# Patient Record
Sex: Female | Born: 1945
Health system: Southern US, Community
[De-identification: ages and names within clinical notes are randomized; demographics above are authoritative.]

## PROBLEM LIST (undated history)

## (undated) ENCOUNTER — Ambulatory Visit

## (undated) ENCOUNTER — Telehealth

## (undated) ENCOUNTER — Other Ambulatory Visit

## (undated) ENCOUNTER — Ambulatory Visit: Payer: Medicare (Managed Care)

## (undated) ENCOUNTER — Encounter

## (undated) ENCOUNTER — Ambulatory Visit: Payer: PRIVATE HEALTH INSURANCE

## (undated) ENCOUNTER — Inpatient Hospital Stay

## (undated) DIAGNOSIS — I739 Peripheral vascular disease, unspecified: Secondary | ICD-10-CM

## (undated) DIAGNOSIS — E785 Hyperlipidemia, unspecified: Secondary | ICD-10-CM

## (undated) DIAGNOSIS — G629 Polyneuropathy, unspecified: Secondary | ICD-10-CM

## (undated) DIAGNOSIS — G8929 Other chronic pain: Secondary | ICD-10-CM

## (undated) DIAGNOSIS — I480 Paroxysmal atrial fibrillation: Secondary | ICD-10-CM

## (undated) DIAGNOSIS — K219 Gastro-esophageal reflux disease without esophagitis: Secondary | ICD-10-CM

## (undated) DIAGNOSIS — M5136 Other intervertebral disc degeneration, lumbar region: Secondary | ICD-10-CM

## (undated) DIAGNOSIS — M5126 Other intervertebral disc displacement, lumbar region: Secondary | ICD-10-CM

## (undated) DIAGNOSIS — T8859XA Other complications of anesthesia, initial encounter: Secondary | ICD-10-CM

## (undated) DIAGNOSIS — D649 Anemia, unspecified: Secondary | ICD-10-CM

## (undated) DIAGNOSIS — M51369 Other intervertebral disc degeneration, lumbar region without mention of lumbar back pain or lower extremity pain: Secondary | ICD-10-CM

## (undated) DIAGNOSIS — I440 Atrioventricular block, first degree: Secondary | ICD-10-CM

## (undated) DIAGNOSIS — Z923 Personal history of irradiation: Secondary | ICD-10-CM

## (undated) DIAGNOSIS — M1990 Unspecified osteoarthritis, unspecified site: Secondary | ICD-10-CM

## (undated) DIAGNOSIS — I10 Essential (primary) hypertension: Secondary | ICD-10-CM

## (undated) DIAGNOSIS — I427 Cardiomyopathy due to drug and external agent: Secondary | ICD-10-CM

## (undated) DIAGNOSIS — Z7901 Long term (current) use of anticoagulants: Secondary | ICD-10-CM

## (undated) DIAGNOSIS — T451X5A Adverse effect of antineoplastic and immunosuppressive drugs, initial encounter: Secondary | ICD-10-CM

## (undated) DIAGNOSIS — Z9221 Personal history of antineoplastic chemotherapy: Secondary | ICD-10-CM

## (undated) DIAGNOSIS — T4145XA Adverse effect of unspecified anesthetic, initial encounter: Secondary | ICD-10-CM

## (undated) DIAGNOSIS — Z9889 Other specified postprocedural states: Secondary | ICD-10-CM

## (undated) DIAGNOSIS — G4700 Insomnia, unspecified: Secondary | ICD-10-CM

## (undated) DIAGNOSIS — I38 Endocarditis, valve unspecified: Secondary | ICD-10-CM

## (undated) DIAGNOSIS — C50919 Malignant neoplasm of unspecified site of unspecified female breast: Secondary | ICD-10-CM

## (undated) DIAGNOSIS — I2510 Atherosclerotic heart disease of native coronary artery without angina pectoris: Secondary | ICD-10-CM

## (undated) DIAGNOSIS — F419 Anxiety disorder, unspecified: Secondary | ICD-10-CM

## (undated) DIAGNOSIS — R112 Nausea with vomiting, unspecified: Secondary | ICD-10-CM

## (undated) HISTORY — PX: OTHER SURGICAL HISTORY: SHX169

## (undated) HISTORY — PX: APPENDECTOMY: SHX54

## (undated) HISTORY — PX: TONSILLECTOMY: SUR1361

## (undated) HISTORY — DX: Essential (primary) hypertension: I10

## (undated) HISTORY — PX: MASTECTOMY: SHX3

## (undated) HISTORY — DX: Hyperlipidemia, unspecified: E78.5

## (undated) HISTORY — DX: Malignant neoplasm of unspecified site of unspecified female breast: C50.919

---

## 2005-10-13 ENCOUNTER — Emergency Department: Payer: Self-pay | Admitting: Emergency Medicine

## 2008-06-20 ENCOUNTER — Ambulatory Visit: Payer: Self-pay | Admitting: Oncology

## 2008-07-16 ENCOUNTER — Ambulatory Visit: Payer: Self-pay | Admitting: Oncology

## 2008-07-21 ENCOUNTER — Ambulatory Visit: Payer: Self-pay | Admitting: Oncology

## 2008-08-17 ENCOUNTER — Ambulatory Visit: Payer: Self-pay | Admitting: General Surgery

## 2008-08-21 ENCOUNTER — Ambulatory Visit: Payer: Self-pay | Admitting: Oncology

## 2008-09-17 ENCOUNTER — Inpatient Hospital Stay: Payer: Self-pay | Admitting: Oncology

## 2008-09-20 ENCOUNTER — Ambulatory Visit: Payer: Self-pay | Admitting: Oncology

## 2008-10-02 ENCOUNTER — Ambulatory Visit: Payer: Self-pay | Admitting: General Surgery

## 2008-10-21 ENCOUNTER — Ambulatory Visit: Payer: Self-pay | Admitting: Oncology

## 2008-11-20 ENCOUNTER — Ambulatory Visit: Payer: Self-pay | Admitting: Oncology

## 2008-12-21 ENCOUNTER — Ambulatory Visit: Payer: Self-pay | Admitting: Oncology

## 2009-01-21 ENCOUNTER — Ambulatory Visit: Payer: Self-pay | Admitting: Oncology

## 2009-02-07 ENCOUNTER — Ambulatory Visit: Payer: Self-pay | Admitting: Cardiology

## 2009-02-07 ENCOUNTER — Ambulatory Visit: Payer: Self-pay | Admitting: Surgery

## 2009-02-14 ENCOUNTER — Ambulatory Visit: Payer: Self-pay | Admitting: Surgery

## 2009-02-18 ENCOUNTER — Ambulatory Visit: Payer: Self-pay | Admitting: Oncology

## 2009-03-21 ENCOUNTER — Ambulatory Visit: Payer: Self-pay | Admitting: Oncology

## 2009-04-20 ENCOUNTER — Ambulatory Visit: Payer: Self-pay | Admitting: Oncology

## 2009-05-21 ENCOUNTER — Ambulatory Visit: Payer: Self-pay | Admitting: Oncology

## 2009-06-20 ENCOUNTER — Ambulatory Visit: Payer: Self-pay | Admitting: Oncology

## 2009-07-21 ENCOUNTER — Ambulatory Visit: Payer: Self-pay | Admitting: Oncology

## 2009-08-21 ENCOUNTER — Ambulatory Visit: Payer: Self-pay | Admitting: Oncology

## 2009-09-18 ENCOUNTER — Ambulatory Visit: Payer: Self-pay | Admitting: Oncology

## 2009-09-20 ENCOUNTER — Ambulatory Visit: Payer: Self-pay | Admitting: Oncology

## 2009-11-08 ENCOUNTER — Ambulatory Visit: Payer: Self-pay | Admitting: Oncology

## 2009-12-21 ENCOUNTER — Ambulatory Visit: Payer: Self-pay | Admitting: Oncology

## 2009-12-21 DIAGNOSIS — T451X5A Adverse effect of antineoplastic and immunosuppressive drugs, initial encounter: Secondary | ICD-10-CM

## 2009-12-21 DIAGNOSIS — I427 Cardiomyopathy due to drug and external agent: Secondary | ICD-10-CM

## 2009-12-21 HISTORY — DX: Cardiomyopathy due to drug and external agent: I42.7

## 2009-12-21 HISTORY — DX: Adverse effect of antineoplastic and immunosuppressive drugs, initial encounter: T45.1X5A

## 2010-01-01 ENCOUNTER — Ambulatory Visit: Payer: Self-pay | Admitting: Oncology

## 2010-01-07 ENCOUNTER — Ambulatory Visit: Payer: Self-pay | Admitting: Oncology

## 2010-01-21 ENCOUNTER — Ambulatory Visit: Payer: Self-pay | Admitting: Oncology

## 2010-01-27 ENCOUNTER — Ambulatory Visit: Payer: Self-pay | Admitting: Ophthalmology

## 2010-02-12 ENCOUNTER — Ambulatory Visit: Payer: Self-pay | Admitting: Ophthalmology

## 2010-02-18 ENCOUNTER — Ambulatory Visit: Payer: Self-pay | Admitting: Oncology

## 2010-03-08 ENCOUNTER — Emergency Department: Payer: Self-pay | Admitting: Emergency Medicine

## 2010-03-17 ENCOUNTER — Observation Stay: Payer: Self-pay | Admitting: Internal Medicine

## 2010-03-21 ENCOUNTER — Ambulatory Visit: Payer: Self-pay | Admitting: Oncology

## 2010-03-25 ENCOUNTER — Ambulatory Visit: Payer: Self-pay | Admitting: Pain Medicine

## 2010-04-03 ENCOUNTER — Ambulatory Visit: Payer: Self-pay | Admitting: Oncology

## 2010-04-20 ENCOUNTER — Ambulatory Visit: Payer: Self-pay | Admitting: Oncology

## 2010-04-28 ENCOUNTER — Ambulatory Visit: Payer: Self-pay | Admitting: Pain Medicine

## 2010-05-01 ENCOUNTER — Ambulatory Visit: Payer: Self-pay | Admitting: Pain Medicine

## 2010-05-07 ENCOUNTER — Encounter: Payer: Self-pay | Admitting: Pain Medicine

## 2010-05-15 ENCOUNTER — Ambulatory Visit: Payer: Self-pay | Admitting: Pain Medicine

## 2010-05-21 ENCOUNTER — Encounter: Payer: Self-pay | Admitting: Pain Medicine

## 2010-06-20 ENCOUNTER — Ambulatory Visit: Payer: Self-pay | Admitting: Oncology

## 2010-07-03 ENCOUNTER — Ambulatory Visit: Payer: Self-pay | Admitting: Oncology

## 2010-07-10 ENCOUNTER — Ambulatory Visit: Payer: Self-pay | Admitting: Oncology

## 2010-07-11 LAB — CANCER ANTIGEN 27.29: CA 27.29: 28 U/mL (ref 0.0–38.6)

## 2010-07-21 ENCOUNTER — Ambulatory Visit: Payer: Self-pay | Admitting: Oncology

## 2010-10-09 ENCOUNTER — Ambulatory Visit: Payer: Self-pay | Admitting: Oncology

## 2010-10-10 LAB — CANCER ANTIGEN 27.29: CA 27.29: 32.8 U/mL (ref 0.0–38.6)

## 2010-10-21 ENCOUNTER — Ambulatory Visit: Payer: Self-pay | Admitting: Oncology

## 2011-01-05 ENCOUNTER — Ambulatory Visit: Payer: Self-pay | Admitting: Oncology

## 2011-01-07 ENCOUNTER — Ambulatory Visit: Payer: Self-pay | Admitting: Oncology

## 2011-01-21 ENCOUNTER — Ambulatory Visit: Payer: Self-pay | Admitting: Oncology

## 2011-04-08 ENCOUNTER — Ambulatory Visit: Payer: Self-pay | Admitting: Oncology

## 2011-04-09 LAB — CANCER ANTIGEN 27.29: CA 27.29: 39 U/mL — ABNORMAL HIGH (ref 0.0–38.6)

## 2011-04-21 ENCOUNTER — Ambulatory Visit: Payer: Self-pay | Admitting: Oncology

## 2011-06-17 ENCOUNTER — Ambulatory Visit: Payer: Self-pay | Admitting: Oncology

## 2011-07-07 ENCOUNTER — Ambulatory Visit: Payer: Self-pay | Admitting: Oncology

## 2011-07-08 ENCOUNTER — Ambulatory Visit: Payer: Self-pay | Admitting: Oncology

## 2011-07-22 ENCOUNTER — Ambulatory Visit: Payer: Self-pay | Admitting: Oncology

## 2011-09-14 ENCOUNTER — Ambulatory Visit: Payer: Self-pay | Admitting: Oncology

## 2011-09-21 ENCOUNTER — Ambulatory Visit: Payer: Self-pay | Admitting: Oncology

## 2011-10-22 ENCOUNTER — Ambulatory Visit: Payer: Self-pay | Admitting: Oncology

## 2011-12-22 HISTORY — PX: EYE SURGERY: SHX253

## 2012-01-12 ENCOUNTER — Ambulatory Visit: Payer: Self-pay | Admitting: Oncology

## 2012-01-22 ENCOUNTER — Ambulatory Visit: Payer: Self-pay | Admitting: Oncology

## 2012-06-14 ENCOUNTER — Ambulatory Visit: Payer: Self-pay | Admitting: Ophthalmology

## 2012-06-14 LAB — POTASSIUM: Potassium: 4.2 mmol/L (ref 3.5–5.1)

## 2012-06-28 ENCOUNTER — Ambulatory Visit: Payer: Self-pay | Admitting: Ophthalmology

## 2012-07-19 ENCOUNTER — Ambulatory Visit: Payer: Self-pay | Admitting: Oncology

## 2012-07-21 ENCOUNTER — Ambulatory Visit: Payer: Self-pay | Admitting: Oncology

## 2012-08-21 ENCOUNTER — Ambulatory Visit: Payer: Self-pay | Admitting: Oncology

## 2012-10-18 ENCOUNTER — Ambulatory Visit: Payer: Self-pay | Admitting: Internal Medicine

## 2012-12-23 ENCOUNTER — Ambulatory Visit: Payer: Self-pay

## 2012-12-27 ENCOUNTER — Ambulatory Visit: Payer: Self-pay | Admitting: Oncology

## 2012-12-27 LAB — COMPREHENSIVE METABOLIC PANEL
Albumin: 4.3 g/dL (ref 3.4–5.0)
BUN: 21 mg/dL — ABNORMAL HIGH (ref 7–18)
Bilirubin,Total: 0.5 mg/dL (ref 0.2–1.0)
Chloride: 99 mmol/L (ref 98–107)
Co2: 27 mmol/L (ref 21–32)
Creatinine: 1.09 mg/dL (ref 0.60–1.30)
EGFR (Non-African Amer.): 52 — ABNORMAL LOW
Glucose: 117 mg/dL — ABNORMAL HIGH (ref 65–99)
Osmolality: 278 (ref 275–301)
Potassium: 4.6 mmol/L (ref 3.5–5.1)
SGOT(AST): 40 U/L — ABNORMAL HIGH (ref 15–37)
SGPT (ALT): 45 U/L (ref 12–78)
Sodium: 137 mmol/L (ref 136–145)

## 2012-12-27 LAB — CBC CANCER CENTER
Eosinophil #: 0.1 x10 3/mm (ref 0.0–0.7)
Lymphocyte %: 29.2 %
MCH: 30 pg (ref 26.0–34.0)
MCV: 87 fL (ref 80–100)
Monocyte #: 0.5 x10 3/mm (ref 0.2–0.9)
Monocyte %: 7.3 %
Neutrophil #: 4.1 x10 3/mm (ref 1.4–6.5)
Neutrophil %: 61.1 %
RBC: 4.88 10*6/uL (ref 3.80–5.20)
RDW: 13.2 % (ref 11.5–14.5)
WBC: 6.6 x10 3/mm (ref 3.6–11.0)

## 2012-12-27 LAB — PROTIME-INR
INR: 1
Prothrombin Time: 13.2 secs (ref 11.5–14.7)

## 2012-12-28 LAB — CANCER ANTIGEN 27.29: CA 27.29: 37.9 U/mL (ref 0.0–38.6)

## 2012-12-30 ENCOUNTER — Ambulatory Visit: Payer: Self-pay | Admitting: Oncology

## 2013-01-21 ENCOUNTER — Ambulatory Visit: Payer: Self-pay | Admitting: Oncology

## 2013-01-27 LAB — CBC CANCER CENTER
Basophil #: 0 x10 3/mm (ref 0.0–0.1)
Basophil %: 0.3 %
Eosinophil #: 0.4 x10 3/mm (ref 0.0–0.7)
Eosinophil %: 7.8 %
HCT: 40.3 % (ref 35.0–47.0)
HGB: 14 g/dL (ref 12.0–16.0)
Lymphocyte #: 1.6 x10 3/mm (ref 1.0–3.6)
MCH: 30.5 pg (ref 26.0–34.0)
MCHC: 34.7 g/dL (ref 32.0–36.0)
MCV: 88 fL (ref 80–100)
Monocyte #: 0.6 x10 3/mm (ref 0.2–0.9)
Monocyte %: 11.5 %
Neutrophil #: 2.4 x10 3/mm (ref 1.4–6.5)
Neutrophil %: 48.5 %
Platelet: 198 x10 3/mm (ref 150–440)
RDW: 14.2 % (ref 11.5–14.5)
WBC: 4.9 x10 3/mm (ref 3.6–11.0)

## 2013-02-18 ENCOUNTER — Ambulatory Visit: Payer: Self-pay | Admitting: Oncology

## 2013-04-19 ENCOUNTER — Ambulatory Visit: Payer: Self-pay | Admitting: Oncology

## 2013-04-21 ENCOUNTER — Ambulatory Visit: Payer: Self-pay | Admitting: Oncology

## 2013-04-24 LAB — BASIC METABOLIC PANEL
BUN: 15 mg/dL (ref 7–18)
Chloride: 104 mmol/L (ref 98–107)
Co2: 26 mmol/L (ref 21–32)
Creatinine: 0.93 mg/dL (ref 0.60–1.30)
EGFR (Non-African Amer.): >60
Glucose: 104 mg/dL — ABNORMAL HIGH (ref 65–99)
Osmolality: 282 (ref 275–301)
Potassium: 4.7 mmol/L (ref 3.5–5.1)
Sodium: 141 mmol/L (ref 136–145)

## 2013-04-24 LAB — HEPATIC FUNCTION PANEL A (ARMC)
Albumin: 3.9 g/dL (ref 3.4–5.0)
Alkaline Phosphatase: 105 U/L (ref 50–136)
Bilirubin, Direct: 0.1 mg/dL (ref 0.00–0.20)
Bilirubin,Total: 0.5 mg/dL (ref 0.2–1.0)
SGOT(AST): 25 U/L (ref 15–37)
SGPT (ALT): 29 U/L (ref 12–78)
Total Protein: 7.3 g/dL (ref 6.4–8.2)

## 2013-04-24 LAB — LIPID PANEL
Ldl Cholesterol, Calc: 87 mg/dL (ref 0–100)
Triglycerides: 97 mg/dL (ref 0–200)
VLDL Cholesterol, Calc: 19 mg/dL (ref 5–40)

## 2013-04-26 ENCOUNTER — Ambulatory Visit: Payer: Self-pay | Admitting: Oncology

## 2013-05-21 ENCOUNTER — Ambulatory Visit: Payer: Self-pay | Admitting: Oncology

## 2013-08-10 ENCOUNTER — Ambulatory Visit: Payer: Self-pay | Admitting: Oncology

## 2013-08-12 LAB — CANCER ANTIGEN 27.29: CA 27.29: 32.5 U/mL (ref 0.0–38.6)

## 2013-08-21 ENCOUNTER — Ambulatory Visit: Payer: Self-pay | Admitting: Oncology

## 2013-10-04 ENCOUNTER — Emergency Department: Payer: Self-pay | Admitting: Emergency Medicine

## 2013-10-04 LAB — CK TOTAL AND CKMB (NOT AT ARMC)
CK, Total: 115 U/L (ref 21–215)
CK-MB: 1.4 ng/mL (ref 0.5–3.6)

## 2013-10-04 LAB — URINALYSIS, COMPLETE
Bacteria: NONE SEEN
Bilirubin,UR: NEGATIVE
Blood: NEGATIVE
Glucose,UR: NEGATIVE mg/dL (ref 0–75)
Ph: 7 (ref 4.5–8.0)
RBC,UR: <1 /HPF (ref 0–5)
WBC UR: 1 /HPF (ref 0–5)

## 2013-10-04 LAB — TROPONIN I: Troponin-I: 0.02 ng/mL

## 2013-10-04 LAB — CBC
HCT: 43.7 % (ref 35.0–47.0)
MCH: 30.2 pg (ref 26.0–34.0)
MCV: 89 fL (ref 80–100)
RBC: 4.93 10*6/uL (ref 3.80–5.20)
RDW: 13.4 % (ref 11.5–14.5)

## 2013-10-04 LAB — COMPREHENSIVE METABOLIC PANEL
Albumin: 4.3 g/dL (ref 3.4–5.0)
Alkaline Phosphatase: 125 U/L (ref 50–136)
Anion Gap: 6 — ABNORMAL LOW (ref 7–16)
Bilirubin,Total: 0.5 mg/dL (ref 0.2–1.0)
Calcium, Total: 9.8 mg/dL (ref 8.5–10.1)
Chloride: 105 mmol/L (ref 98–107)
Glucose: 129 mg/dL — ABNORMAL HIGH (ref 65–99)
Potassium: 4.1 mmol/L (ref 3.5–5.1)
SGOT(AST): 38 U/L — ABNORMAL HIGH (ref 15–37)
Total Protein: 8 g/dL (ref 6.4–8.2)

## 2014-05-03 ENCOUNTER — Ambulatory Visit: Payer: Self-pay | Admitting: Oncology

## 2014-05-21 ENCOUNTER — Ambulatory Visit: Payer: Self-pay | Admitting: Oncology

## 2014-12-21 HISTORY — PX: BREAST IMPLANT REMOVAL: SUR1101

## 2014-12-21 HISTORY — PX: PLACEMENT OF BREAST IMPLANTS: SHX6334

## 2014-12-21 HISTORY — PX: PORT-A-CATH REMOVAL: SHX5289

## 2015-04-14 NOTE — Op Note (Signed)
PATIENT NAME:  Kelsey Carter, Kelsey Carter MR#:  [id number] DATE OF BIRTH:  09-Jan-1946  DATE OF PROCEDURE:  06/28/2012  PREOPERATIVE DIAGNOSIS: Visually significant cataract of the right eye.   POSTOPERATIVE DIAGNOSIS: Visually significant cataract of the right eye.   OPERATIVE PROCEDURE: Cataract extraction by phacoemulsification with implant of intraocular lens to right eye.   SURGEON: Kelsey Robson, MD.   ANESTHESIA:  1. Managed anesthesia care.  2. Topical tetracaine drops followed by 2% Xylocaine jelly applied in the preoperative holding area.   COMPLICATIONS: None.   TECHNIQUE:  Stop-and-chop    DESCRIPTION OF PROCEDURE: The patient was examined and consented in the preoperative holding area where the aforementioned topical anesthesia was applied to the right eye and then brought back to the Operating Room where the right eye was prepped and draped in the usual sterile ophthalmic fashion and a lid speculum was placed. A paracentesis was created with the side port blade and the anterior chamber was filled with viscoelastic. A near clear corneal incision was performed with the steel keratome. A continuous curvilinear capsulorrhexis was performed with a cystotome followed by the capsulorrhexis forceps. Hydrodissection and hydrodelineation were carried out with BSS on a blunt cannula. The lens was removed in a stop-and-chop technique and the remaining cortical material was removed with the irrigation-aspiration handpiece. The capsular bag was inflated with viscoelastic and the Alcon SN60WF 20.5-diopter lens, serial number [id number] was placed in the capsular bag without complication. The remaining viscoelastic was removed from the eye with the irrigation-aspiration handpiece. The wounds were hydrated. The anterior chamber was flushed with Miostat and the eye was inflated to physiologic pressure. The wounds were found to be water tight. The eye was dressed with Vigamox. The patient was given  protective glasses to wear throughout the day and a shield with which to sleep tonight. The patient was also given drops with which to begin a drop regimen today and will follow-up with me in one day.   ____________________________ Livingston Diones. Harvie Morua, MD wlp:drc D: 06/28/2012 12:37:08 ET T: 06/28/2012 13:04:24 ET JOB#: [id number]  cc: Birt Reinoso L. Masha Orbach, MD, <Dictator> Livingston Diones Ahri Olson MD ELECTRONICALLY SIGNED 06/29/2012 9:59

## 2016-12-21 HISTORY — PX: CARDIAC CATHETERIZATION: SHX172

## 2017-08-24 ENCOUNTER — Ambulatory Visit (INDEPENDENT_AMBULATORY_CARE_PROVIDER_SITE_OTHER): Payer: PPO | Admitting: Family Medicine

## 2017-08-24 ENCOUNTER — Encounter: Payer: Self-pay | Admitting: Family Medicine

## 2017-08-24 VITALS — BP 138/82 | HR 67 | Resp 16 | Ht 65.0 in | Wt 178.9 lb

## 2017-08-24 DIAGNOSIS — C50911 Malignant neoplasm of unspecified site of right female breast: Secondary | ICD-10-CM | POA: Insufficient documentation

## 2017-08-24 DIAGNOSIS — E785 Hyperlipidemia, unspecified: Secondary | ICD-10-CM | POA: Diagnosis not present

## 2017-08-24 DIAGNOSIS — G620 Drug-induced polyneuropathy: Secondary | ICD-10-CM | POA: Insufficient documentation

## 2017-08-24 DIAGNOSIS — B351 Tinea unguium: Secondary | ICD-10-CM | POA: Diagnosis not present

## 2017-08-24 DIAGNOSIS — I10 Essential (primary) hypertension: Secondary | ICD-10-CM

## 2017-08-24 DIAGNOSIS — G4700 Insomnia, unspecified: Secondary | ICD-10-CM | POA: Diagnosis not present

## 2017-08-24 DIAGNOSIS — Z283 Underimmunization status: Secondary | ICD-10-CM | POA: Diagnosis not present

## 2017-08-24 DIAGNOSIS — T451X5A Adverse effect of antineoplastic and immunosuppressive drugs, initial encounter: Secondary | ICD-10-CM | POA: Diagnosis not present

## 2017-08-24 DIAGNOSIS — I427 Cardiomyopathy due to drug and external agent: Secondary | ICD-10-CM | POA: Insufficient documentation

## 2017-08-24 DIAGNOSIS — E663 Overweight: Secondary | ICD-10-CM | POA: Diagnosis not present

## 2017-08-24 DIAGNOSIS — E559 Vitamin D deficiency, unspecified: Secondary | ICD-10-CM | POA: Diagnosis not present

## 2017-08-24 DIAGNOSIS — Z853 Personal history of malignant neoplasm of breast: Secondary | ICD-10-CM | POA: Diagnosis not present

## 2017-08-24 DIAGNOSIS — C50912 Malignant neoplasm of unspecified site of left female breast: Secondary | ICD-10-CM

## 2017-08-24 DIAGNOSIS — Z289 Immunization not carried out for unspecified reason: Secondary | ICD-10-CM

## 2017-08-24 NOTE — Progress Notes (Signed)
Date:  08/24/2017   Name:  Kelsey Carter   DOB:  1946/07/01   MRN:  [id number]  PCP:  Adline Potter, MD    Chief Complaint: Establish Care (From [place] needs refills on all meds. ) and Insomnia (Sleep med Xanax was what she took for years and is out.)   History of Present Illness:  This is a 71 y.o. female seen for initial visit. Recently moved back to [place] from [place]. Hx breast ca in 2009, s/p RT/chemo and again in 2014 s/p B mastectomy with complications requiring revisions, no issues since 2106, remains on Arimidex per Dr. Grayland Ormond. Cardiac cath in 03/2017 showed heart damage from chemo, metoprolol and Lipitor doses increased and started on daily asa, repeat echo recommended in six months, needs cards referral. Also has peripheral neuropathy from chemo in hands and feet, uses Xanax qhs to help sleep per Dr. Grayland Ormond. On vit D supp for deficiency. Onychomycosis since chemo, saw podiatrist in [place], requests referral. Father died unknown ca 12, mother died heart dz 57, brother died in accident. Had Prevnar in 2017 but declines Pneumovax, flu, zoster, and tetanus imms as says make her sick. Colonoscopies x 3, all normal. Planning to see dentist and ophtho soon.  Review of Systems:  Review of Systems  Constitutional: Negative for chills and fever.  HENT: Negative for ear pain, sinus pain and trouble swallowing.   Eyes: Negative for pain.  Respiratory: Negative for cough and shortness of breath.   Cardiovascular: Negative for chest pain and leg swelling.  Gastrointestinal: Negative for abdominal pain, constipation and diarrhea.  Endocrine: Negative for polydipsia and polyuria.  Genitourinary: Negative for difficulty urinating.  Musculoskeletal: Negative for myalgias.  Neurological: Negative for syncope and light-headedness.    Patient Active Problem List   Diagnosis Date Noted  . Hypertension 08/24/2017  . Cardiomyopathy due to chemotherapy ([place]) 08/24/2017  . Insomnia 08/24/2017  . Peripheral  neuropathy due to chemotherapy ([place]) 08/24/2017  . HX: breast cancer 08/24/2017  . Onychomycosis 08/24/2017  . Overweight (BMI 25.0-29.9) 08/24/2017  . Vitamin D deficiency 08/24/2017  . Hyperlipidemia 08/24/2017    Prior to Admission medications   Medication Sig Start Date End Date Taking? Authorizing Provider  ALPRAZolam Duanne Moron) 0.5 MG tablet Take 0.5 mg by mouth at bedtime as needed.   Yes [provider]  anastrozole (ARIMIDEX) 1 MG tablet Take 1 mg by mouth daily.   Yes [provider]  aspirin (BAYER LOW DOSE) 81 MG EC tablet Take 81 mg by mouth daily. Swallow whole.   Yes [provider]  atorvastatin (LIPITOR) 40 MG tablet Take 40 mg by mouth at bedtime.   Yes [provider]  Cholecalciferol (VITAMIN D3) 5000 units TBDP Take 1 tablet by mouth daily.   Yes [provider]  losartan-hydrochlorothiazide (HYZAAR) 100-12.5 MG tablet Take 1 tablet by mouth daily.   Yes [provider]  Metoprolol Succinate 50 MG CS24 Take 50 mg by mouth daily.   Yes [provider]    No Known Allergies  Past Surgical History:  Procedure Laterality Date  . CARDIAC CATHETERIZATION  2018  . CESAREAN SECTION    . MASTECTOMY     5 diff procedures   . TONSILLECTOMY      Social History  Substance Use Topics  . Smoking status: Never Smoker  . Smokeless tobacco: Never Used  . Alcohol use No    Family History  Problem Relation Age of Onset  . Hypertension Mother   .  Cancer Mother        cervical    Medication list has been reviewed and updated.  Physical Examination: BP 138/82   Pulse 67   Resp 16   Ht 5\' 5"  (1.651 m)   Wt 178 lb 14.4 oz (81.1 kg)   SpO2 98%   BMI 29.77 kg/m   Physical Exam  Constitutional: She is oriented to person, place, and time. She appears well-developed and well-nourished.  HENT:  Head: Normocephalic and atraumatic.  Right Ear: External ear normal.  Left Ear: External ear normal.  Nose: Nose  normal.  Mouth/Throat: Oropharynx is clear and moist.  TMs clear  Eyes: Pupils are equal, round, and reactive to light. Conjunctivae and EOM are normal. No scleral icterus.  Neck: Neck supple. No thyromegaly present.  Cardiovascular: Normal rate, regular rhythm, normal heart sounds and intact distal pulses.   Pulmonary/Chest: Effort normal and breath sounds normal.  Abdominal: Soft. She exhibits no distension and no mass. There is no tenderness.  Musculoskeletal: She exhibits no edema.  Lymphadenopathy:    She has no cervical adenopathy.  Neurological: She is alert and oriented to person, place, and time. Coordination normal.  Romberg sl unsteady, gait normal  Skin: Skin is warm and dry.  Psychiatric: She has a normal mood and affect. Her behavior is normal.  Nursing note and vitals reviewed.   Assessment and Plan:  1. Cardiomyopathy due to chemotherapy [facility]) On asa and increased metoprolol and Lipitor doses since April - Ambulatory referral to Cardiology  2. Peripheral neuropathy due to chemotherapy [facility]) Consider gabapentin or Lyrica  3. Essential hypertension Well controlled on Hyzaar/metoprolol - Comprehensive Metabolic Panel (CMET) - CBC  4. Hyperlipidemia, unspecified hyperlipidemia type On increased dose Lipitor - Lipid Profile  5. Insomnia, unspecified type On Xanax per Dr. Grayland Ormond  6. Onychomycosis - Ambulatory referral to Podiatry  7. Overweight (BMI 25.0-29.9) - TSH  8. Vitamin D deficiency On supplement - Vitamin D (25 hydroxy)  9. HX: breast cancer On Arimidex, Dr. Grayland Ormond following, has appt next week  10. Delayed immunizations Declines Pneumovax, flu, zoster, tetanus imms  Return in about 4 weeks (around 09/21/2017).   45 minutes spent with pt over half in counseling  Tery Hoeger M. [facility]  08/24/2017

## 2017-08-25 ENCOUNTER — Other Ambulatory Visit: Payer: Self-pay | Admitting: Family Medicine

## 2017-08-25 LAB — COMPREHENSIVE METABOLIC PANEL
ALK PHOS: 102 IU/L (ref 39–117)
ALT: 22 IU/L (ref 0–32)
AST: 27 IU/L (ref 0–40)
Albumin/Globulin Ratio: 1.8 (ref 1.2–2.2)
Albumin: 4.6 g/dL (ref 3.5–4.8)
BUN/Creatinine Ratio: 22 (ref 12–28)
BUN: 23 mg/dL (ref 8–27)
Bilirubin Total: 0.5 mg/dL (ref 0.0–1.2)
CALCIUM: 10.2 mg/dL (ref 8.7–10.3)
CO2: 24 mmol/L (ref 20–29)
CREATININE: 1.05 mg/dL — AB (ref 0.57–1.00)
Chloride: 98 mmol/L (ref 96–106)
GFR calc Af Amer: 62 mL/min/{1.73_m2} (ref >59)
GFR calc non Af Amer: 54 mL/min/{1.73_m2} — ABNORMAL LOW (ref >59)
GLUCOSE: 101 mg/dL — AB (ref 65–99)
Globulin, Total: 2.5 g/dL (ref 1.5–4.5)
Potassium: 5.1 mmol/L (ref 3.5–5.2)
SODIUM: 138 mmol/L (ref 134–144)
Total Protein: 7.1 g/dL (ref 6.0–8.5)

## 2017-08-25 LAB — CBC
HEMATOCRIT: 36.4 % (ref 34.0–46.6)
HEMOGLOBIN: 12.6 g/dL (ref 11.1–15.9)
MCH: 30.7 pg (ref 26.6–33.0)
MCHC: 34.6 g/dL (ref 31.5–35.7)
MCV: 89 fL (ref 79–97)
Platelets: 251 10*3/uL (ref 150–379)
RBC: 4.11 x10E6/uL (ref 3.77–5.28)
RDW: 14.7 % (ref 12.3–15.4)
WBC: 5.9 10*3/uL (ref 3.4–10.8)

## 2017-08-25 LAB — TSH: TSH: 4.65 u[IU]/mL — ABNORMAL HIGH (ref 0.450–4.500)

## 2017-08-25 LAB — LIPID PANEL
CHOL/HDL RATIO: 2.2 ratio (ref 0.0–4.4)
Cholesterol, Total: 117 mg/dL (ref 100–199)
HDL: 53 mg/dL (ref >39)
LDL Calculated: 48 mg/dL (ref 0–99)
TRIGLYCERIDES: 82 mg/dL (ref 0–149)
VLDL CHOLESTEROL CAL: 16 mg/dL (ref 5–40)

## 2017-08-25 LAB — VITAMIN D 25 HYDROXY (VIT D DEFICIENCY, FRACTURES): Vit D, 25-Hydroxy: 87 ng/mL (ref 30.0–100.0)

## 2017-08-25 MED ORDER — VITAMIN D 50 MCG (2000 UT) PO CAPS: 1.0000 | ORAL_CAPSULE | Freq: Every day | ORAL | Status: DC

## 2017-08-25 NOTE — Progress Notes (Signed)
Vit D dose decreased, consider repeat TSH, B12 given neuropathy next visit.

## 2017-08-27 NOTE — Progress Notes (Signed)
[place]  [phone] [phone]  ID: Kelsey Carter OB: [birth year]  MR#: [id number]  ZCH#:[id number]  Patient Care Team: Adline Potter, MD as PCP - General (Family Medicine)  CHIEF COMPLAINT: Bilateral breast cancer.  INTERVAL HISTORY: Patient last evaluated in clinic in May 2015. She is now moving back to [place] from [place] and is here to reestablish care. She has a history of left breast cancer and then was diagnosed with a right breast cancer in early 2015. Her second cancer was ER/PR positive. She is tolerating anastrozole well without significant side effects. She currently feels well. She has persistent peripheral neuropathy, but no neurologic complaints. She admits to increased anxiety. She has a good appetite and denies weight loss. She denies pain. She has no chest pain or shortness of breath. She denies any nausea, vomiting, constipation, or diarrhea. She has no urinary complaints. Patient offers no further specific complaints.  REVIEW OF SYSTEMS:   Review of Systems  Constitutional: Negative.  Negative for fever, malaise/fatigue and weight loss.  Respiratory: Negative.  Negative for cough and shortness of breath.   Cardiovascular: Negative.  Negative for chest pain and leg swelling.  Gastrointestinal: Negative.  Negative for abdominal pain.  Genitourinary: Negative.   Musculoskeletal: Negative.   Skin: Negative.  Negative for rash.  Neurological: Positive for tingling and sensory change. Negative for weakness.  Psychiatric/Behavioral: The patient is nervous/anxious.     As per HPI. Otherwise, a complete review of systems is negative.  PAST MEDICAL HISTORY: Past Medical History:  Diagnosis Date  . Breast cancer ([place])   . Hyperlipidemia   . Hypertension     PAST SURGICAL HISTORY: Past Surgical History:  Procedure Laterality Date  . APPENDECTOMY    . CARDIAC CATHETERIZATION  2018  . CESAREAN SECTION    .  MASTECTOMY     5 diff procedures   . TONSILLECTOMY      FAMILY HISTORY: Family History  Problem Relation Age of Onset  . Hypertension Mother   . Cervical cancer Mother   . Cancer Father   . Breast cancer Paternal Aunt   . Tuberculosis Maternal Grandfather   . Breast cancer Paternal Grandmother     ADVANCED DIRECTIVES (Y/N):  N  HEALTH MAINTENANCE: Social History  Substance Use Topics  . Smoking status: Never Smoker  . Smokeless tobacco: Never Used  . Alcohol use No     Colonoscopy:  PAP:  Bone density:  Lipid panel:  Allergies  Allergen Reactions  . Ciprofloxacin Other (See Comments)    Joint pain   . Hydrocodone-Acetaminophen Nausea And Vomiting    Current Outpatient Prescriptions  Medication Sig Dispense Refill  . ALPRAZolam (XANAX) 0.5 MG tablet Take 0.5 mg by mouth at bedtime as needed.    Marland Kitchen anastrozole (ARIMIDEX) 1 MG tablet Take 1 tablet (1 mg total) by mouth daily. 90 tablet 3  . aspirin (BAYER LOW DOSE) 81 MG EC tablet Take 81 mg by mouth daily. Swallow whole.    Marland Kitchen atorvastatin (LIPITOR) 40 MG tablet Take 40 mg by mouth at bedtime.    . Cholecalciferol (VITAMIN D) 2000 units CAPS Take 1 capsule (2,000 Units total) by mouth daily. 30 capsule   . losartan-hydrochlorothiazide (HYZAAR) 100-12.5 MG tablet Take 1 tablet by mouth daily.    . Metoprolol Succinate 50 MG CS24 Take 50 mg by mouth daily.    Marland Kitchen ALPRAZolam (XANAX) 1 MG tablet Take 1 tablet (1 mg total) by mouth at bedtime  as needed for anxiety. 30 tablet 0   No current facility-administered medications for this visit.     OBJECTIVE: Vitals:   08/30/17 1510  BP: 130/84  Pulse: 65  Resp: 18  Temp: 97.9 F (36.6 C)     Body mass index is 29.57 kg/m.    ECOG FS:0 - Asymptomatic  General: Well-developed, well-nourished, no acute distress. Eyes: Pink conjunctiva, anicteric sclera. HEENT: Normocephalic, moist mucous membranes, clear oropharnyx. Breasts: Bilateral mastectomy. Lungs: Clear to  auscultation bilaterally. Heart: Regular rate and rhythm. No rubs, murmurs, or gallops. Abdomen: Soft, nontender, nondistended. No organomegaly noted, normoactive bowel sounds. Musculoskeletal: No edema, cyanosis, or clubbing. Neuro: Alert, answering all questions appropriately. Cranial nerves grossly intact. Skin: No rashes or petechiae noted. Psych: Normal affect. Lymphatics: No cervical, calvicular, axillary or inguinal LAD.   LAB RESULTS:  Lab Results  Component Value Date   NA 138 08/24/2017   K 5.1 08/24/2017   CL 98 08/24/2017   CO2 24 08/24/2017   GLUCOSE 101 (H) 08/24/2017   BUN 23 08/24/2017   CREATININE 1.05 (H) 08/24/2017   CALCIUM 10.2 08/24/2017   PROT 7.1 08/24/2017   ALBUMIN 4.6 08/24/2017   AST 27 08/24/2017   ALT 22 08/24/2017   ALKPHOS 102 08/24/2017   BILITOT 0.5 08/24/2017   GFRNONAA 54 (L) 08/24/2017   GFRAA 62 08/24/2017    Lab Results  Component Value Date   WBC 5.9 08/24/2017   NEUTROABS 2.4 01/27/2013   HGB 12.6 08/24/2017   HCT 36.4 08/24/2017   MCV 89 08/24/2017   PLT 251 08/24/2017     STUDIES: No results found.  ASSESSMENT: Bilateral breast cancer.  PLAN:    1. Bilateral breast cancer: Patient has a history of a pathologic stage IA adenocarcinoma of the left breast which was ER and HER-2 negative, PR 5%. Patient received chemotherapy and XRT for her initial breast cancer.  In May ~2015 she was diagnosed with a second breast cancer that was noted to be ER/PR positive and HER-2 negative. Patient underwent bilateral mastectomy and therefore did not require adjuvant XRT. She was initiated on Arimidex and will complete 5 years of treatment in May 2020. Patient will require a bone mineral density in the next 1-2 weeks. No further intervention is needed at this time. Patient does not require any further mammograms. Return to clinic in 6 months for further evaluation. 2. Anxiety/insomnia: Patient was given a prescription for Xanax  today.  Approximately 30 minutes was spent in discussion of which greater than 50% was consultation.  Patient expressed understanding and was in agreement with this plan. She also understands that She can call clinic at any time with any questions, concerns, or complaints.    Lloyd Huger, MD   09/03/2017 9:41 AM

## 2017-08-30 ENCOUNTER — Inpatient Hospital Stay: Payer: PPO | Attending: Oncology | Admitting: Oncology

## 2017-08-30 ENCOUNTER — Encounter: Payer: Self-pay | Admitting: Oncology

## 2017-08-30 VITALS — BP 130/84 | HR 65 | Temp 97.9°F | Resp 18 | Ht 65.0 in | Wt 177.7 lb

## 2017-08-30 DIAGNOSIS — F419 Anxiety disorder, unspecified: Secondary | ICD-10-CM | POA: Diagnosis not present

## 2017-08-30 DIAGNOSIS — G629 Polyneuropathy, unspecified: Secondary | ICD-10-CM

## 2017-08-30 DIAGNOSIS — Z170 Estrogen receptor positive status [ER+]: Secondary | ICD-10-CM | POA: Diagnosis not present

## 2017-08-30 DIAGNOSIS — Z79899 Other long term (current) drug therapy: Secondary | ICD-10-CM | POA: Diagnosis not present

## 2017-08-30 DIAGNOSIS — Z9013 Acquired absence of bilateral breasts and nipples: Secondary | ICD-10-CM | POA: Diagnosis not present

## 2017-08-30 DIAGNOSIS — Z8049 Family history of malignant neoplasm of other genital organs: Secondary | ICD-10-CM | POA: Diagnosis not present

## 2017-08-30 DIAGNOSIS — Z9221 Personal history of antineoplastic chemotherapy: Secondary | ICD-10-CM

## 2017-08-30 DIAGNOSIS — Z79811 Long term (current) use of aromatase inhibitors: Secondary | ICD-10-CM

## 2017-08-30 DIAGNOSIS — C50911 Malignant neoplasm of unspecified site of right female breast: Secondary | ICD-10-CM

## 2017-08-30 DIAGNOSIS — G4700 Insomnia, unspecified: Secondary | ICD-10-CM | POA: Diagnosis not present

## 2017-08-30 DIAGNOSIS — Z803 Family history of malignant neoplasm of breast: Secondary | ICD-10-CM

## 2017-08-30 DIAGNOSIS — Z7982 Long term (current) use of aspirin: Secondary | ICD-10-CM

## 2017-08-30 DIAGNOSIS — I10 Essential (primary) hypertension: Secondary | ICD-10-CM | POA: Diagnosis not present

## 2017-08-30 DIAGNOSIS — Z853 Personal history of malignant neoplasm of breast: Secondary | ICD-10-CM | POA: Diagnosis not present

## 2017-08-30 DIAGNOSIS — E785 Hyperlipidemia, unspecified: Secondary | ICD-10-CM | POA: Diagnosis not present

## 2017-08-30 DIAGNOSIS — Z923 Personal history of irradiation: Secondary | ICD-10-CM

## 2017-08-30 MED ORDER — ANASTROZOLE 1 MG PO TABS: 1.0000 mg | ORAL_TABLET | Freq: Every day | ORAL | 3 refills | Status: DC

## 2017-08-30 MED ORDER — ALPRAZOLAM 1 MG PO TABS: 1.0000 mg | ORAL_TABLET | Freq: Every evening | ORAL | 0 refills | Status: DC | PRN

## 2017-09-10 ENCOUNTER — Ambulatory Visit: Payer: Self-pay | Admitting: Podiatry

## 2017-09-21 ENCOUNTER — Ambulatory Visit (INDEPENDENT_AMBULATORY_CARE_PROVIDER_SITE_OTHER): Payer: PPO | Admitting: Family Medicine

## 2017-09-21 ENCOUNTER — Encounter: Payer: Self-pay | Admitting: Family Medicine

## 2017-09-21 VITALS — BP 116/78 | HR 69 | Resp 16 | Ht 65.0 in | Wt 181.0 lb

## 2017-09-21 DIAGNOSIS — I427 Cardiomyopathy due to drug and external agent: Secondary | ICD-10-CM

## 2017-09-21 DIAGNOSIS — B351 Tinea unguium: Secondary | ICD-10-CM | POA: Diagnosis not present

## 2017-09-21 DIAGNOSIS — E785 Hyperlipidemia, unspecified: Secondary | ICD-10-CM

## 2017-09-21 DIAGNOSIS — E559 Vitamin D deficiency, unspecified: Secondary | ICD-10-CM

## 2017-09-21 DIAGNOSIS — R7989 Other specified abnormal findings of blood chemistry: Secondary | ICD-10-CM

## 2017-09-21 DIAGNOSIS — Z853 Personal history of malignant neoplasm of breast: Secondary | ICD-10-CM

## 2017-09-21 DIAGNOSIS — I10 Essential (primary) hypertension: Secondary | ICD-10-CM | POA: Diagnosis not present

## 2017-09-21 DIAGNOSIS — T451X5A Adverse effect of antineoplastic and immunosuppressive drugs, initial encounter: Principal | ICD-10-CM

## 2017-09-21 MED ORDER — LOSARTAN POTASSIUM-HCTZ 100-12.5 MG PO TABS: 1.0000 | ORAL_TABLET | Freq: Every day | ORAL | 3 refills | Status: DC

## 2017-09-21 MED ORDER — ATORVASTATIN CALCIUM 40 MG PO TABS: 40.0000 mg | ORAL_TABLET | Freq: Every day | ORAL | 3 refills | Status: DC

## 2017-09-21 MED ORDER — METOPROLOL SUCCINATE ER 50 MG PO TB24: 50.0000 mg | ORAL_TABLET | Freq: Every day | ORAL | 3 refills | Status: DC

## 2017-09-21 NOTE — Progress Notes (Signed)
Date:  09/21/2017   Name:  Kelsey Carter   DOB:  [birth year]   MRN:  [id number]  PCP:  Adline Potter, MD    Chief Complaint: Hypertension (90 day refills 4 wk f/u-Going to Gym 4times a week and doing work out with trainer. )   History of Present Illness:  This is a 71 y.o. female seen for one month f/u from initial visit. Saw oncology, Arimidex and Xanax refilled, DEXA ordered. Has appt with cards later thins month. Saw podiatry but did not like, will see usual one in [place] next visit. Blood work showed sl elevated TSH (chronic per pt) and high vit D level, supplement decreased. Going to gym 3d/wk but no weight loss yet. Declines all imms.  Review of Systems:  Review of Systems  Constitutional: Negative for chills and fever.  Respiratory: Negative for cough and shortness of breath.   Cardiovascular: Negative for chest pain and leg swelling.  Genitourinary: Negative for difficulty urinating.  Neurological: Negative for syncope and light-headedness.    Patient Active Problem List   Diagnosis Date Noted  . Hypertension 08/24/2017  . Cardiomyopathy due to chemotherapy ([place]) 08/24/2017  . Insomnia 08/24/2017  . Peripheral neuropathy due to chemotherapy ([place]) 08/24/2017  . HX: breast cancer 08/24/2017  . Onychomycosis 08/24/2017  . Overweight (BMI 25.0-29.9) 08/24/2017  . Vitamin D deficiency 08/24/2017  . Hyperlipidemia 08/24/2017    Prior to Admission medications   Medication Sig Start Date End Date Taking? Authorizing Provider  ALPRAZolam Duanne Moron) 1 MG tablet Take 1 tablet (1 mg total) by mouth at bedtime as needed for anxiety. 08/30/17  Yes Lloyd Huger, MD  anastrozole (ARIMIDEX) 1 MG tablet Take 1 tablet (1 mg total) by mouth daily. 08/30/17  Yes Lloyd Huger, MD  aspirin (BAYER LOW DOSE) 81 MG EC tablet Take 81 mg by mouth daily. Swallow whole.   Yes [provider]  atorvastatin (LIPITOR) 40 MG tablet Take 1 tablet (40 mg total) by mouth at bedtime.  09/21/17  Yes Dolora Ridgely, Gwyndolyn Saxon, MD  Cholecalciferol (VITAMIN D) 2000 units CAPS Take 1 capsule (2,000 Units total) by mouth daily. 08/25/17  Yes Zygmunt Mcglinn, Gwyndolyn Saxon, MD  losartan-hydrochlorothiazide (HYZAAR) 100-12.5 MG tablet Take 1 tablet by mouth daily. 09/21/17  Yes Horacio Werth, Gwyndolyn Saxon, MD  metoprolol succinate (TOPROL-XL) 50 MG 24 hr tablet Take 1 tablet (50 mg total) by mouth daily. 09/21/17  Yes Louie Meaders, Gwyndolyn Saxon, MD    Allergies  Allergen Reactions  . Ciprofloxacin Other (See Comments)    Joint pain   . Hydrocodone-Acetaminophen Nausea And Vomiting    Past Surgical History:  Procedure Laterality Date  . APPENDECTOMY    . CARDIAC CATHETERIZATION  2018  . CESAREAN SECTION    . MASTECTOMY     5 diff procedures   . TONSILLECTOMY      Social History  Substance Use Topics  . Smoking status: Never Smoker  . Smokeless tobacco: Never Used  . Alcohol use No    Family History  Problem Relation Age of Onset  . Hypertension Mother   . Cervical cancer Mother   . Cancer Father   . Breast cancer Paternal Aunt   . Tuberculosis Maternal Grandfather   . Breast cancer Paternal Grandmother     Medication list has been reviewed and updated.  Physical Examination: BP 116/78   Pulse 69   Resp 16   Ht 5\' 5"  (1.651 m)   Wt 181 lb (82.1 kg)   SpO2 98%   BMI  30.12 kg/m   Physical Exam  Constitutional: She appears well-developed and well-nourished.  Cardiovascular: Normal rate, regular rhythm and normal heart sounds.   Pulmonary/Chest: Effort normal and breath sounds normal.  Musculoskeletal: She exhibits no edema.  Neurological: She is alert.  Skin: Skin is warm and dry.  Psychiatric: She has a normal mood and affect. Her behavior is normal.  Nursing note and vitals reviewed.   Assessment and Plan:  1. Cardiomyopathy due to chemotherapy ([place]) Stable, for cards f/u this month  2. Essential hypertension Well controlled on Hyzaar/metoprolol, refill  3. Hyperlipidemia, unspecified  hyperlipidemia type Well controlled on Lipitor, refill  4. Onychomycosis To see podiatry in [place]  5. Vitamin D deficiency On decreased supplement  6. HX: breast cancer Oncology following  7. Elevated TSH Chronic per pt, asymptomatic, monitor  Return in about 6 months (around 03/22/2018).  Satira Anis. [facility]  09/21/2017

## 2017-09-30 ENCOUNTER — Other Ambulatory Visit: Payer: Self-pay | Admitting: *Deleted

## 2017-09-30 MED ORDER — ALPRAZOLAM 1 MG PO TABS: 1.0000 mg | ORAL_TABLET | Freq: Every evening | ORAL | 0 refills | Status: DC | PRN

## 2017-09-30 NOTE — Telephone Encounter (Signed)
Asking for a refill of Alprazolam and wants a 90 day supply. Please advise

## 2017-10-05 DIAGNOSIS — I10 Essential (primary) hypertension: Secondary | ICD-10-CM | POA: Insufficient documentation

## 2017-10-05 DIAGNOSIS — E782 Mixed hyperlipidemia: Secondary | ICD-10-CM | POA: Insufficient documentation

## 2017-10-05 DIAGNOSIS — I2510 Atherosclerotic heart disease of native coronary artery without angina pectoris: Secondary | ICD-10-CM | POA: Insufficient documentation

## 2017-10-05 DIAGNOSIS — Z7689 Persons encountering health services in other specified circumstances: Secondary | ICD-10-CM | POA: Diagnosis not present

## 2017-10-05 DIAGNOSIS — R0602 Shortness of breath: Secondary | ICD-10-CM | POA: Diagnosis not present

## 2017-10-05 DIAGNOSIS — I25118 Atherosclerotic heart disease of native coronary artery with other forms of angina pectoris: Secondary | ICD-10-CM | POA: Diagnosis not present

## 2017-10-06 ENCOUNTER — Ambulatory Visit
Admission: RE | Admit: 2017-10-06 | Discharge: 2017-10-06 | Disposition: A | Discharge status: Home / Self Care | Payer: PPO | Source: Ambulatory Visit | Attending: Oncology | Admitting: Oncology

## 2017-10-06 DIAGNOSIS — Z853 Personal history of malignant neoplasm of breast: Secondary | ICD-10-CM | POA: Insufficient documentation

## 2017-10-06 DIAGNOSIS — M810 Age-related osteoporosis without current pathological fracture: Secondary | ICD-10-CM | POA: Insufficient documentation

## 2017-10-18 DIAGNOSIS — I10 Essential (primary) hypertension: Secondary | ICD-10-CM | POA: Diagnosis not present

## 2017-10-18 DIAGNOSIS — R0602 Shortness of breath: Secondary | ICD-10-CM | POA: Diagnosis not present

## 2017-10-18 DIAGNOSIS — I2510 Atherosclerotic heart disease of native coronary artery without angina pectoris: Secondary | ICD-10-CM | POA: Diagnosis not present

## 2017-10-18 DIAGNOSIS — E782 Mixed hyperlipidemia: Secondary | ICD-10-CM | POA: Diagnosis not present

## 2017-11-03 ENCOUNTER — Telehealth: Payer: Self-pay | Admitting: *Deleted

## 2017-11-03 MED ORDER — ALPRAZOLAM 1 MG PO TABS: 1.0000 mg | ORAL_TABLET | Freq: Every evening | ORAL | 0 refills | Status: DC | PRN

## 2017-11-03 NOTE — Telephone Encounter (Signed)
Prescription refill sent to pharmacy Informed that prescription has been sent

## 2017-11-15 DIAGNOSIS — E782 Mixed hyperlipidemia: Secondary | ICD-10-CM | POA: Diagnosis not present

## 2017-11-15 DIAGNOSIS — I10 Essential (primary) hypertension: Secondary | ICD-10-CM | POA: Diagnosis not present

## 2017-11-15 DIAGNOSIS — I2510 Atherosclerotic heart disease of native coronary artery without angina pectoris: Secondary | ICD-10-CM | POA: Diagnosis not present

## 2017-11-19 ENCOUNTER — Telehealth: Payer: Self-pay | Admitting: *Deleted

## 2017-11-19 NOTE — Telephone Encounter (Signed)
I spoke with patient regarding contraindication for for flu vaccine that she left in office. Patient states she has been unable to get the vaccine for 15 years due to her immune system. The only option I see listed on the form would be the permanent condition that is a contraindication. I verified that patient does not have history of GBS or egg allergy. I also explained that it is ultimately up to [facility] to decide if they will accept documentation of the contraindication listed. Can you please given Kelsey Carter the form? Thank you so much.

## 2017-11-19 NOTE — Telephone Encounter (Signed)
Form completed and given to [facility]

## 2017-12-06 ENCOUNTER — Telehealth: Payer: Self-pay

## 2017-12-06 DIAGNOSIS — G4700 Insomnia, unspecified: Secondary | ICD-10-CM

## 2017-12-06 MED ORDER — ALPRAZOLAM 1 MG PO TABS: 1.0000 mg | ORAL_TABLET | Freq: Every evening | ORAL | 0 refills | Status: DC | PRN

## 2017-12-06 NOTE — Telephone Encounter (Signed)
Patient called requesting refill of her Xanax prescription.  Approved by Lorretta Harp, NP.  Script called into [facility] store on [address].  Patient notified.

## 2017-12-16 ENCOUNTER — Telehealth: Payer: Self-pay | Admitting: *Deleted

## 2017-12-16 NOTE — Telephone Encounter (Signed)
She asked me the same thing about all the other shots she wants to refuse. He said she should be ok to get any of the required shots since she has not been on treatments, but ill let Woodfin Ganja make that call

## 2017-12-16 NOTE — Telephone Encounter (Signed)
Nothing from a breast cancer standpoint or treatment standpoint would prevent her from getting an MMR.  If she wishes to refuse, she would need a physician note from her primary care physician. 

## 2017-12-16 NOTE — Telephone Encounter (Signed)
Patient informed of physician response and she said she will have to make up her mind if she wants to go through with the shots or not.She stated "that's fine"

## 2017-12-16 NOTE — Telephone Encounter (Signed)
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 Asking for Dr Virgel Manifold input regarding this matter if she should take it or not

## 2017-12-21 DIAGNOSIS — G8929 Other chronic pain: Secondary | ICD-10-CM

## 2017-12-21 HISTORY — DX: Other chronic pain: G89.29

## 2018-01-07 ENCOUNTER — Other Ambulatory Visit: Payer: Self-pay | Admitting: *Deleted

## 2018-01-07 ENCOUNTER — Other Ambulatory Visit: Payer: Self-pay | Admitting: Oncology

## 2018-01-07 DIAGNOSIS — G4700 Insomnia, unspecified: Secondary | ICD-10-CM

## 2018-01-07 MED ORDER — ALPRAZOLAM 1 MG PO TABS: 1.0000 mg | ORAL_TABLET | Freq: Every evening | ORAL | 0 refills | Status: DC | PRN

## 2018-02-03 ENCOUNTER — Other Ambulatory Visit: Payer: Self-pay | Admitting: *Deleted

## 2018-02-03 DIAGNOSIS — G4700 Insomnia, unspecified: Secondary | ICD-10-CM

## 2018-02-03 MED ORDER — ALPRAZOLAM 1 MG PO TABS: 1.0000 mg | ORAL_TABLET | Freq: Every evening | ORAL | 2 refills | Status: DC | PRN

## 2018-02-11 ENCOUNTER — Telehealth: Payer: Self-pay

## 2018-02-11 ENCOUNTER — Other Ambulatory Visit: Payer: Self-pay | Admitting: Family Medicine

## 2018-02-11 DIAGNOSIS — M549 Dorsalgia, unspecified: Principal | ICD-10-CM

## 2018-02-11 DIAGNOSIS — G8929 Other chronic pain: Secondary | ICD-10-CM

## 2018-02-11 NOTE — Telephone Encounter (Signed)
[facility]. Has done before with him and they said they will be glad to see her again with referral. Can har

## 2018-02-15 ENCOUNTER — Encounter: Payer: Self-pay | Admitting: Nurse Practitioner

## 2018-02-15 ENCOUNTER — Other Ambulatory Visit: Payer: Self-pay | Admitting: Nurse Practitioner

## 2018-02-15 ENCOUNTER — Ambulatory Visit
Admission: RE | Admit: 2018-02-15 | Discharge: 2018-02-15 | Disposition: A | Discharge status: Home / Self Care | Payer: PPO | Source: Ambulatory Visit | Attending: Nurse Practitioner | Admitting: Nurse Practitioner

## 2018-02-15 ENCOUNTER — Ambulatory Visit: Payer: PPO | Attending: Nurse Practitioner | Admitting: Nurse Practitioner

## 2018-02-15 VITALS — BP 157/79 | HR 79 | Temp 97.7°F | Resp 16 | Ht 64.0 in | Wt 169.0 lb

## 2018-02-15 DIAGNOSIS — Z6828 Body mass index (BMI) 28.0-28.9, adult: Secondary | ICD-10-CM | POA: Insufficient documentation

## 2018-02-15 DIAGNOSIS — Z79891 Long term (current) use of opiate analgesic: Secondary | ICD-10-CM | POA: Insufficient documentation

## 2018-02-15 DIAGNOSIS — M5442 Lumbago with sciatica, left side: Secondary | ICD-10-CM | POA: Diagnosis not present

## 2018-02-15 DIAGNOSIS — M533 Sacrococcygeal disorders, not elsewhere classified: Secondary | ICD-10-CM | POA: Diagnosis not present

## 2018-02-15 DIAGNOSIS — I10 Essential (primary) hypertension: Secondary | ICD-10-CM | POA: Diagnosis not present

## 2018-02-15 DIAGNOSIS — E663 Overweight: Secondary | ICD-10-CM | POA: Diagnosis not present

## 2018-02-15 DIAGNOSIS — G8929 Other chronic pain: Secondary | ICD-10-CM | POA: Insufficient documentation

## 2018-02-15 DIAGNOSIS — I2510 Atherosclerotic heart disease of native coronary artery without angina pectoris: Secondary | ICD-10-CM | POA: Insufficient documentation

## 2018-02-15 DIAGNOSIS — E559 Vitamin D deficiency, unspecified: Secondary | ICD-10-CM | POA: Diagnosis not present

## 2018-02-15 DIAGNOSIS — M545 Low back pain: Secondary | ICD-10-CM | POA: Diagnosis not present

## 2018-02-15 DIAGNOSIS — Z79899 Other long term (current) drug therapy: Secondary | ICD-10-CM | POA: Diagnosis not present

## 2018-02-15 DIAGNOSIS — Z853 Personal history of malignant neoplasm of breast: Secondary | ICD-10-CM | POA: Insufficient documentation

## 2018-02-15 DIAGNOSIS — Z789 Other specified health status: Secondary | ICD-10-CM | POA: Diagnosis not present

## 2018-02-15 DIAGNOSIS — B351 Tinea unguium: Secondary | ICD-10-CM | POA: Insufficient documentation

## 2018-02-15 DIAGNOSIS — G620 Drug-induced polyneuropathy: Secondary | ICD-10-CM | POA: Diagnosis not present

## 2018-02-15 DIAGNOSIS — M47816 Spondylosis without myelopathy or radiculopathy, lumbar region: Secondary | ICD-10-CM | POA: Insufficient documentation

## 2018-02-15 DIAGNOSIS — E785 Hyperlipidemia, unspecified: Secondary | ICD-10-CM | POA: Insufficient documentation

## 2018-02-15 DIAGNOSIS — Z885 Allergy status to narcotic agent status: Secondary | ICD-10-CM | POA: Insufficient documentation

## 2018-02-15 DIAGNOSIS — Z881 Allergy status to other antibiotic agents status: Secondary | ICD-10-CM | POA: Insufficient documentation

## 2018-02-15 DIAGNOSIS — M79605 Pain in left leg: Secondary | ICD-10-CM

## 2018-02-15 DIAGNOSIS — G894 Chronic pain syndrome: Secondary | ICD-10-CM | POA: Insufficient documentation

## 2018-02-15 DIAGNOSIS — M899 Disorder of bone, unspecified: Secondary | ICD-10-CM | POA: Diagnosis not present

## 2018-02-15 DIAGNOSIS — Z7982 Long term (current) use of aspirin: Secondary | ICD-10-CM | POA: Insufficient documentation

## 2018-02-15 DIAGNOSIS — E079 Disorder of thyroid, unspecified: Secondary | ICD-10-CM | POA: Diagnosis not present

## 2018-02-15 NOTE — Progress Notes (Signed)
Safety precautions to be maintained throughout the outpatient stay will include: orient to surroundings, keep bed in low position, maintain call bell within reach at all times, provide assistance with transfer out of bed and ambulation.  

## 2018-02-15 NOTE — Patient Instructions (Signed)

## 2018-02-15 NOTE — Progress Notes (Signed)
Patient's Name: Kelsey Carter  MRN: [id number]  Referring Provider: Adline Potter, MD  DOB: 06/23/1946  PCP: Adline Potter, MD  DOS: 02/15/2018  Note by: Dionisio David NP  Service setting: Ambulatory outpatient  Specialty: Interventional Pain Management  Location: [facility] (AMB) Pain Management Facility    Patient type: New Patient    Primary Reason(s) for Visit: Initial Patient Evaluation CC: Back Pain (lower)  HPI  Kelsey Carter is a 72 y.o. year old, female patient, who comes today for an initial evaluation. She has Hypertension; Cardiomyopathy due to chemotherapy [facility]); Insomnia; Peripheral neuropathy due to chemotherapy ([place]); HX: breast cancer; Onychomycosis; Overweight (BMI 25.0-29.9); Vitamin D deficiency; Hyperlipidemia; Elevated TSH; Benign essential HTN; Coronary artery disease involving native coronary artery of native heart; Hyperlipidemia, mixed; Chronic bilateral low back pain with left-sided sciatica (Primary Area of Pain); Chronic pain of left lower extremity (Secondary Area of Pain); Chronic pain syndrome; Long term prescription benzodiazepine use; Pharmacologic therapy; Disorder of skeletal system; Problems influencing health status; and Chronic sacroiliac joint pain on their problem list.. Her primarily concern today is the Back Pain (lower)  Pain Assessment: Location: Right Back Radiating: buttocks/hips downback of legto the bottom of foot Onset: More than a month ago Duration: Chronic pain Quality: Aching, Constant, Radiating Severity: 7 /10 (self-reported pain score)  Note: Reported level is compatible with observation. Clinically the patient looks like a 3/10 A 3/10 is viewed as "Moderate" and described as significantly interfering with activities of daily living (ADL). It becomes difficult to feed, bathe, get dressed, get on and off the toilet or to perform personal hygiene functions. Difficult to get in and out of bed or a chair without assistance. Very distracting.  With effort, it can be ignored when deeply involved in activities. Information on the proper use of the pain scale provided to the patient today. When using our objective Pain Scale, levels between 6 and 10/10 are said to belong in an emergency room, as it progressively worsens from a 6/10, described as severely limiting, requiring emergency care not usually available at an outpatient pain management facility. At a 6/10 level, communication becomes difficult and requires great effort. Assistance to reach the emergency department may be required. Facial flushing and profuse sweating along with potentially dangerous increases in heart rate and blood pressure will be evident. Effect on ADL: prolonged walking, hard to sit,  Timing: Constant Modifying factors: ice, heat, exercise  Onset and Duration: Sudden Cause of pain: Unknown Severity: Getting worse, NAS-11 at its worse: 4/10, NAS-11 at its best: 7/10, NAS-11 now: 7/10 and NAS-11 on the average: 8/10 Timing: Morning Aggravating Factors: Prolonged sitting, Prolonged standing and Walking Alleviating Factors: Cold packs, Hot packs and Medications Associated Problems: Fatigue, Spasms, Tingling, Vomiting , Pain that wakes patient up and Pain that does not allow patient to sleep Quality of Pain: Aching, Disabling, Distressing, Exhausting, Getting longer, Nagging, Pulsating, Sharp, Shooting and Stabbing Previous Examinations or Tests: The patient denies previous test Previous Treatments: Facet blocks  The patient comes into the clinics today for the first time for a chronic pain management evaluation. According to the patient her primary area of pain is in her lower extremities. She admits that the left is greater than the right. She has numbness tingling and weakness that goes down the back of her leg into her entire foot. She admits that she does have peripheral neuropathy secondary to chemotherapy treatments.  Her second area of pain is in her lower  back. She admits that his  left sided. He admits that she did have previous right-sided back pain the past however that is resolved. She denies any previous surgery. She has had interventional therapy on the right which was effective. She denies recent physical therapy or images.  Today I took the time to provide the patient with information regarding this pain practice. The patient was informed that the practice is divided into two sections: an interventional pain management section, as well as a completely separate and distinct medication management section. I explained that there are procedure days for interventional therapies, and evaluation days for follow-ups and medication management. Because of the amount of documentation required during both, they are kept separated. This means that there is the possibility that she may be scheduled for a procedure on one day, and medication management the next. I have also informed her that because of staffing and facility limitations, this practice will no longer take patients for medication management only. To illustrate the reasons for this, I gave the patient the example of surgeons, and how inappropriate it would be to refer a patient to his/her care, just to write for the post-surgical antibiotics on a surgery done by a different surgeon.   Because interventional pain management is part of the board-certified specialty for the doctors, the patient was informed that joining this practice means that they are open to any and all interventional therapies. I made it clear that this does not mean that they will be forced to have any procedures done. What this means is that I believe interventional therapies to be essential part of the diagnosis and proper management of chronic pain conditions. Therefore, patients not interested in these interventional alternatives will be better served under the care of a different practitioner.  The patient was also made aware of my  Comprehensive Pain Management Safety Guidelines where by joining this practice, they limit all of their nerve blocks and joint injections to those done by our practice, for as long as we are retained to manage their care. Historic Controlled Substance Pharmacotherapy Review  PMP and historical list of controlled substances: Alprazolam 1 mg, alprazolam 0.5 PMP Highest opioid analgesic regimen found: None Most recent opioid analgesic: None Current opioid analgesics: None Highest recorded MME/day: 0 mg/day MME/day: 0 mg/day Medications: The patient did not bring the medication(s) to the appointment, as requested in our "New Patient Package" Pharmacodynamics: Desired effects: Analgesia: The patient reports >50% benefit. Reported improvement in function: The patient reports medication allows her to accomplish basic ADLs. Clinically meaningful improvement in function (CMIF): Sustained CMIF goals met Perceived effectiveness: Described as relatively effective, allowing for increase in activities of daily living (ADL) Undesirable effects: Side-effects or Adverse reactions: None reported Historical Monitoring: The patient  reports that she does not use drugs. List of all UDS Test(s): No results found for: MDMA, COCAINSCRNUR, PCPSCRNUR, PCPQUANT, CANNABQUANT, THCU, [place] List of all Serum Drug Screening Test(s):  No results found for: AMPHSCRSER, BARBSCRSER, BENZOSCRSER, COCAINSCRSER, PCPSCRSER, PCPQUANT, THCSCRSER, CANNABQUANT, OPIATESCRSER, OXYSCRSER, PROPOXSCRSER Historical Background Evaluation: [place] PDMP: Six (6) year initial data search conducted.             [place] Department of public safety, offender search: Editor, commissioning Information) Non-contributory Risk Assessment Profile: Aberrant behavior: None observed or detected today Risk factors for fatal opioid overdose: Benzodiazepine use and signs of non-medical use of Opioids Fatal overdose hazard ratio (HR): Calculation deferred Non-fatal overdose hazard  ratio (HR): Calculation deferred Risk of opioid abuse or dependence: 0.7-3.0% with doses ? 36 MME/day and  6.1-26% with doses ? 120 MME/day. Substance use disorder (SUD) risk level: Pending results of Medical Psychology Evaluation for SUD Opioid risk tool (ORT) (Total Score): 0  ORT Scoring interpretation table:  Score <3 = Low Risk for SUD  Score between 4-7 = Moderate Risk for SUD  Score >8 = High Risk for Opioid Abuse   PHQ-2 Depression Scale:  Total score: 0  PHQ-2 Scoring interpretation table: (Score and probability of major depressive disorder)  Score 0 = No depression  Score 1 = 15.4% Probability  Score 2 = 21.1% Probability  Score 3 = 38.4% Probability  Score 4 = 45.5% Probability  Score 5 = 56.4% Probability  Score 6 = 78.6% Probability   PHQ-9 Depression Scale:  Total score: 0  PHQ-9 Scoring interpretation table:  Score 0-4 = No depression  Score 5-9 = Mild depression  Score 10-14 = Moderate depression  Score 15-19 = Moderately severe depression  Score 20-27 = Severe depression (2.4 times higher risk of SUD and 2.89 times higher risk of overuse)   Pharmacologic Plan: Pending ordered tests and/or consults  Meds  The patient has a current medication list which includes the following prescription(s): alprazolam, anastrozole, aspirin, atorvastatin, vitamin d, ibuprofen, losartan-hydrochlorothiazide, and metoprolol succinate.  Current Outpatient Medications on File Prior to Visit  Medication Sig  . ALPRAZolam (XANAX) 1 MG tablet Take 1 tablet (1 mg total) by mouth at bedtime as needed for anxiety.  Marland Kitchen anastrozole (ARIMIDEX) 1 MG tablet Take 1 tablet (1 mg total) by mouth daily.  Marland Kitchen aspirin (BAYER LOW DOSE) 81 MG EC tablet Take 81 mg by mouth daily. Swallow whole.  Marland Kitchen atorvastatin (LIPITOR) 40 MG tablet Take 1 tablet (40 mg total) by mouth at bedtime.  . Cholecalciferol (VITAMIN D) 2000 units CAPS Take 1 capsule (2,000 Units total) by mouth daily.  Marland Kitchen ibuprofen (ADVIL,MOTRIN)  800 MG tablet Take 800 mg by mouth every 8 (eight) hours as needed.  Marland Kitchen losartan-hydrochlorothiazide (HYZAAR) 100-12.5 MG tablet Take 1 tablet by mouth daily.  . metoprolol succinate (TOPROL-XL) 50 MG 24 hr tablet Take 1 tablet (50 mg total) by mouth daily.   No current facility-administered medications on file prior to visit.    Imaging Review  Note: No new results found.        ROS  Cardiovascular History: Heart trouble, Daily Aspirin intake, High blood pressure and Heart catheterization Pulmonary or Respiratory History: No reported pulmonary signs or symptoms such as wheezing and difficulty taking a deep full breath (Asthma), difficulty blowing air out (Emphysema), coughing up mucus (Bronchitis), persistent dry cough, or temporary stoppage of breathing during sleep Neurological History: Abnormal skin sensations (Peripheral Neuropathy) Review of Past Neurological Studies: No results found for this or any previous visit. Psychological-Psychiatric History: No reported psychological or psychiatric signs or symptoms such as difficulty sleeping, anxiety, depression, delusions or hallucinations (schizophrenial), mood swings (bipolar disorders) or suicidal ideations or attempts Gastrointestinal History: No reported gastrointestinal signs or symptoms such as vomiting or evacuating blood, reflux, heartburn, alternating episodes of diarrhea and constipation, inflamed or scarred liver, or pancreas or irrregular and/or infrequent bowel movements Genitourinary History: No reported renal or genitourinary signs or symptoms such as difficulty voiding or producing urine, peeing blood, non-functioning kidney, kidney stones, difficulty emptying the bladder, difficulty controlling the flow of urine, or chronic kidney disease Hematological History: No reported hematological signs or symptoms such as prolonged bleeding, low or poor functioning platelets, bruising or bleeding easily, hereditary bleeding problems, low  energy levels due  to low hemoglobin or being anemic Endocrine History: No reported endocrine signs or symptoms such as high or low blood sugar, rapid heart rate due to high thyroid levels, obesity or weight gain due to slow thyroid or thyroid disease Rheumatologic History: No reported rheumatological signs and symptoms such as fatigue, joint pain, tenderness, swelling, redness, heat, stiffness, decreased range of motion, with or without associated rash Musculoskeletal History: Negative for myasthenia gravis, muscular dystrophy, multiple sclerosis or malignant hyperthermia Work History: Retired  Allergies  Ms. Dario is allergic to ciprofloxacin and hydrocodone-acetaminophen.  Laboratory Chemistry  Inflammation Markers No results found for: CRP, ESRSEDRATE (CRP: Acute Phase) (ESR: Chronic Phase) Renal Function Markers Lab Results  Component Value Date   BUN 23 08/24/2017   CREATININE 1.05 (H) 08/24/2017   GFRAA 62 08/24/2017   GFRNONAA 54 (L) 08/24/2017   Hepatic Function Markers Lab Results  Component Value Date   AST 27 08/24/2017   ALT 22 08/24/2017   ALBUMIN 4.6 08/24/2017   ALKPHOS 102 08/24/2017   Electrolytes Lab Results  Component Value Date   NA 138 08/24/2017   K 5.1 08/24/2017   CL 98 08/24/2017   CALCIUM 10.2 08/24/2017   Neuropathy Markers No results found for: [id number] Bone Pathology Markers Lab Results  Component Value Date   ALKPHOS 102 08/24/2017   VD25OH 87.0 08/24/2017   CALCIUM 10.2 08/24/2017   Coagulation Parameters Lab Results  Component Value Date   INR 1.0 12/27/2012   LABPROT 13.2 12/27/2012   APTT 27.9 12/27/2012   PLT 251 08/24/2017   Cardiovascular Markers Lab Results  Component Value Date   HGB 12.6 08/24/2017   HCT 36.4 08/24/2017   Note: Lab results reviewed.  PFSH  Drug: Ms. Nolley  reports that she does not use drugs. Alcohol:  reports that she does not drink alcohol. Tobacco:  reports that  has never smoked.  she has never used smokeless tobacco. Medical:  has a past medical history of Breast cancer ([place]), Hyperlipidemia, and Hypertension. Family: family history includes Breast cancer in her paternal aunt and paternal grandmother; Cancer in her father; Cervical cancer in her mother; Hypertension in her mother; Tuberculosis in her maternal grandfather.  Past Surgical History:  Procedure Laterality Date  . APPENDECTOMY    . CARDIAC CATHETERIZATION  2018  . CESAREAN SECTION    . MASTECTOMY     5 diff procedures   . TONSILLECTOMY     Active Ambulatory Problems    Diagnosis Date Noted  . Hypertension 08/24/2017  . Cardiomyopathy due to chemotherapy ([place]) 08/24/2017  . Insomnia 08/24/2017  . Peripheral neuropathy due to chemotherapy ([place]) 08/24/2017  . HX: breast cancer 08/24/2017  . Onychomycosis 08/24/2017  . Overweight (BMI 25.0-29.9) 08/24/2017  . Vitamin D deficiency 08/24/2017  . Hyperlipidemia 08/24/2017  . Elevated TSH 09/21/2017  . Benign essential HTN 10/05/2017  . Coronary artery disease involving native coronary artery of native heart 10/05/2017  . Hyperlipidemia, mixed 10/05/2017  . Chronic bilateral low back pain with left-sided sciatica (Primary Area of Pain) 02/15/2018  . Chronic pain of left lower extremity (Secondary Area of Pain) 02/15/2018  . Chronic pain syndrome 02/15/2018  . Long term prescription benzodiazepine use 02/15/2018  . Pharmacologic therapy 02/15/2018  . Disorder of skeletal system 02/15/2018  . Problems influencing health status 02/15/2018  . Chronic sacroiliac joint pain 02/15/2018   Resolved Ambulatory Problems    Diagnosis Date Noted  . No Resolved Ambulatory Problems   Past Medical History:  Diagnosis Date  .  Breast cancer ([place])   . Hyperlipidemia   . Hypertension    Constitutional Exam  General appearance: Well nourished, well developed, and well hydrated. In no apparent acute distress Vitals:   02/15/18 0801  BP: (!) 157/79  Pulse: 79   Resp: 16  Temp: 97.7 F (36.5 C)  SpO2: 100%  Weight: 169 lb (76.7 kg)  Height: 5' 4" (1.626 m)   BMI Assessment: Estimated body mass index is 29.01 kg/m as calculated from the following:   Height as of this encounter: 5' 4" (1.626 m).   Weight as of this encounter: 169 lb (76.7 kg).  BMI interpretation table: BMI level Category Range association with higher incidence of chronic pain  <18 kg/m2 Underweight   18.5-24.9 kg/m2 Ideal body weight   25-29.9 kg/m2 Overweight Increased incidence by 20%  30-34.9 kg/m2 Obese (Class I) Increased incidence by 68%  35-39.9 kg/m2 Severe obesity (Class II) Increased incidence by 136%  >40 kg/m2 Extreme obesity (Class III) Increased incidence by 254%   BMI Readings from Last 4 Encounters:  02/15/18 29.01 kg/m  09/21/17 30.12 kg/m  08/30/17 29.57 kg/m  08/24/17 29.77 kg/m   Wt Readings from Last 4 Encounters:  02/15/18 169 lb (76.7 kg)  09/21/17 181 lb (82.1 kg)  08/30/17 177 lb 11.2 oz (80.6 kg)  08/24/17 178 lb 14.4 oz (81.1 kg)  Psych/Mental status: Alert, oriented x 3 (person, place, & time)       Eyes: PERLA Respiratory: No evidence of acute respiratory distress  Cervical Spine Exam  Inspection: No masses, redness, or swelling Alignment: Symmetrical Functional ROM: Unrestricted ROM      Stability: No instability detected Muscle strength & Tone: Functionally intact Sensory: Unimpaired Palpation: No palpable anomalies              Upper Extremity (UE) Exam    Side: Right upper extremity  Side: Left upper extremity  Inspection: No masses, redness, swelling, or asymmetry. No contractures  Inspection: No masses, redness, swelling, or asymmetry. No contractures  Functional ROM: Unrestricted ROM          Functional ROM: Unrestricted ROM          Muscle strength & Tone: Functionally intact  Muscle strength & Tone: Functionally intact  Sensory: Unimpaired  Sensory: Unimpaired  Palpation: No palpable anomalies               Palpation: No palpable anomalies              Specialized Test(s): Deferred         Specialized Test(s): Deferred          Thoracic Spine Exam  Inspection: No masses, redness, or swelling Alignment: Symmetrical Functional ROM: Unrestricted ROM Stability: No instability detected Sensory: Unimpaired Muscle strength & Tone: No palpable anomalies  Lumbar Spine Exam  Inspection: No masses, redness, or swelling Alignment: Symmetrical Functional ROM: Painful restricted ROM      Stability: No instability detected Muscle strength & Tone: Functionally intact Sensory: Unimpaired Palpation: Complains of area being tender to palpation       Provocative Tests: Lumbar Hyperextension and rotation test: Positive       Patrick's Maneuver: Positive for bilateral S-I arthralgia              Gait & Posture Assessment  Ambulation: Unassisted Gait: Relatively normal for age and body habitus Posture: WNL   Lower Extremity Exam    Side: Right lower extremity  Side: Left lower extremity  Inspection: No  masses, redness, swelling, or asymmetry. No contractures  Inspection: No masses, redness, swelling, or asymmetry. No contractures  Functional ROM: Unrestricted ROM          Functional ROM: Decreased ROM          Muscle strength & Tone: Functionally intact  Muscle strength & Tone: Functionally intact  Sensory: Unimpaired  Sensory: Unimpaired  Palpation: No palpable anomalies  Palpation: No palpable anomalies   Assessment  Primary Diagnosis & Pertinent Problem List: The primary encounter diagnosis was Chronic bilateral low back pain with left-sided sciatica (Primary Area of Pain). Diagnoses of Chronic pain of left lower extremity (Secondary Area of Pain), Chronic sacroiliac joint pain, Chronic pain syndrome, Long term prescription benzodiazepine use, Pharmacologic therapy, Disorder of skeletal system, and Problems influencing health status were also pertinent to this visit.  Visit Diagnosis: 1. Chronic  bilateral low back pain with left-sided sciatica (Primary Area of Pain)   2. Chronic pain of left lower extremity (Secondary Area of Pain)   3. Chronic sacroiliac joint pain   4. Chronic pain syndrome   5. Long term prescription benzodiazepine use   6. Pharmacologic therapy   7. Disorder of skeletal system   8. Problems influencing health status    Plan of Care  Initial treatment plan:  Please be advised that as per protocol, today's visit has been an evaluation only. We have not taken over the patient's controlled substance management.  Problem-specific plan: No problem-specific Assessment & Plan notes found for this encounter.  Ordered Lab-work, Procedure(s), Referral(s), & Consult(s): Orders Placed This Encounter  Procedures  . DG Lumbar Spine Complete W/Bend  . DG Si Joints  . Compliance Drug Analysis, Ur  . Comp. Metabolic Panel (12)  . Vitamin B12  . Magnesium  . Sedimentation rate  . 25-Hydroxyvitamin D Lcms D2+D3  . C-reactive protein   Pharmacotherapy: Medications ordered:  No orders of the defined types were placed in this encounter.  Medications administered during this visit: [name] had no medications administered during this visit.   Pharmacotherapy under consideration:  Opioid Analgesics: The patient was informed that there is no guarantee that she would be a candidate for opioid analgesics. The decision will be made following CDC guidelines. This decision will be based on the results of diagnostic studies, as well as Ms. Christenson's risk profile.  Membrane stabilizer: To be determined at a later time Muscle relaxant: To be determined at a later time NSAID: To be determined at a later time Other analgesic(s): To be determined at a later time   Interventional therapies under consideration: Ms. Bourque was informed that there is no guarantee that she would be a candidate for interventional therapies. The decision will be based on the results of  diagnostic studies, as well as Ms. Lagrand's risk profile.  Possible procedure(s): Diagnostic left sided LESI Diagnostic bilateral Sacroiliac joint injection Diagnostic left sided lumbar facet nerve block Possible left-sided lumbar facet RFA    Provider-requested follow-up: Return for 2nd Visit, w/ Dr. Dossie Arbour, [place] before return for second visit.  Future Appointments  Date Time Provider [place]  02/28/2018  3:00 PM Lloyd Huger, MD [facility] None  03/02/2018 10:30 AM Milinda Pointer, MD [facility] None  03/22/2018  9:00 AM Adline Potter, MD [facility] None    Primary Care Physician: Adline Potter, MD Location: [facility] Outpatient Pain Management Facility Note by:  Date: 02/15/2018; Time: 11:42 AM  Pain Score Disclaimer: We use the NRS-11 scale. This is a self-reported, subjective measurement  of pain severity with only modest accuracy. It is used primarily to identify changes within a particular patient. It must be understood that outpatient pain scales are significantly less accurate that those used for research, where they can be applied under ideal controlled circumstances with minimal exposure to variables. In reality, the score is likely to be a combination of pain intensity and pain affect, where pain affect describes the degree of emotional arousal or changes in action readiness caused by the sensory experience of pain. Factors such as social and work situation, setting, emotional state, anxiety levels, expectation, and prior pain experience may influence pain perception and show large inter-individual differences that may also be affected by time variables.  Patient instructions provided during this appointment: Patient Instructions   ____________________________________________________________________________________________  Appointment Policy Summary  It is our goal and responsibility to provide the medical community with assistance in the evaluation and  management of patients with chronic pain. Unfortunately our resources are limited. Because we do not have an unlimited amount of time, or available appointments, we are required to closely monitor and manage their use. The following rules exist to maximize their use:  Patient's responsibilities: 1. Punctuality:  At what time should I arrive? You should be physically present in our office 30 minutes before your scheduled appointment. Your scheduled appointment is with your assigned healthcare provider. However, it takes 5-10 minutes to be "checked-in", and another 15 minutes for the nurses to do the admission. If you arrive to our office at the time you were given for your appointment, you will end up being at least 20-25 minutes late to your appointment with the provider. 2. Tardiness:  What happens if I arrive only a few minutes after my scheduled appointment time? You will need to reschedule your appointment. The cutoff is your appointment time. This is why it is so important that you arrive at least 30 minutes before that appointment. If you have an appointment scheduled for 10:00 AM and you arrive at 10:01, you will be required to reschedule your appointment.  3. Plan ahead:  Always assume that you will encounter traffic on your way in. Plan for it. If you are dependent on a driver, make sure they understand these rules and the need to arrive early. 4. Other appointments and responsibilities:  Avoid scheduling any other appointments before or after your pain clinic appointments.  5. Be prepared:  Write down everything that you need to discuss with your healthcare provider and give this information to the admitting nurse. Write down the medications that you will need refilled. Bring your pills and bottles (even the empty ones), to all of your appointments, except for those where a procedure is scheduled. 6. No children or pets:  Find someone to take care of them. It is not appropriate to bring them  in. 7. Scheduling changes:  We request "advanced notification" of any changes or cancellations. 8. Advanced notification:  Defined as a time period of more than 24 hours prior to the originally scheduled appointment. This allows for the appointment to be offered to other patients. 9. Rescheduling:  When a visit is rescheduled, it will require the cancellation of the original appointment. For this reason they both fall within the category of "Cancellations".  10. Cancellations:  They require advanced notification. Any cancellation less than 24 hours before the  appointment will be recorded as a "No Show". 11. No Show:  Defined as an unkept appointment where the patient failed to notify or declare to  the practice their intention or inability to keep the appointment.  Corrective process for repeat offenders:  1. Tardiness: Three (3) episodes of rescheduling due to late arrivals will be recorded as one (1) "No Show". 2. Cancellation or reschedule: Three (3) cancellations or rescheduling will be recorded as one (1) "No Show". 3. "No Shows": Three (3) "No Shows" within a 12 month period will result in discharge from the practice. ____________________________________________________________________________________________  ____________________________________________________________________________________________  Pain Scale  Introduction: The pain score used by this practice is the Verbal Numerical Rating Scale (VNRS-11). This is an 11-point scale. It is for adults and children 10 years or older. There are significant differences in how the pain score is reported, used, and applied. Forget everything you learned in the past and learn this scoring system.  General Information: The scale should reflect your current level of pain. Unless you are specifically asked for the level of your worst pain, or your average pain. If you are asked for one of these two, then it should be understood that it is  over the past 24 hours.  Basic Activities of Daily Living (ADL): Personal hygiene, dressing, eating, transferring, and using restroom.  Instructions: Most patients tend to report their level of pain as a combination of two factors, their physical pain and their psychosocial pain. This last one is also known as "suffering" and it is reflection of how physical pain affects you socially and psychologically. From now on, report them separately. From this point on, when asked to report your pain level, report only your physical pain. Use the following table for reference.  Pain Clinic Pain Levels (0-5/10)  Pain Level Score  Description  No Pain 0   Mild pain 1 Nagging, annoying, but does not interfere with basic activities of daily living (ADL). Patients are able to eat, bathe, get dressed, toileting (being able to get on and off the toilet and perform personal hygiene functions), transfer (move in and out of bed or a chair without assistance), and maintain continence (able to control bladder and bowel functions). Blood pressure and heart rate are unaffected. A normal heart rate for a healthy adult ranges from 60 to 100 bpm (beats per minute).   Mild to moderate pain 2 Noticeable and distracting. Impossible to hide from other people. More frequent flare-ups. Still possible to adapt and function close to normal. It can be very annoying and may have occasional stronger flare-ups. With discipline, patients may get used to it and adapt.   Moderate pain 3 Interferes significantly with activities of daily living (ADL). It becomes difficult to feed, bathe, get dressed, get on and off the toilet or to perform personal hygiene functions. Difficult to get in and out of bed or a chair without assistance. Very distracting. With effort, it can be ignored when deeply involved in activities.   Moderately severe pain 4 Impossible to ignore for more than a few minutes. With effort, patients may still be able to manage work  or participate in some social activities. Very difficult to concentrate. Signs of autonomic nervous system discharge are evident: dilated pupils (mydriasis); mild sweating (diaphoresis); sleep interference. Heart rate becomes elevated (>115 bpm). Diastolic blood pressure (lower number) rises above 100 mmHg. Patients find relief in laying down and not moving.   Severe pain 5 Intense and extremely unpleasant. Associated with frowning face and frequent crying. Pain overwhelms the senses.  Ability to do any activity or maintain social relationships becomes significantly limited. Conversation becomes difficult. Pacing back and  forth is common, as getting into a comfortable position is nearly impossible. Pain wakes you up from deep sleep. Physical signs will be obvious: pupillary dilation; increased sweating; goosebumps; brisk reflexes; cold, clammy hands and feet; nausea, vomiting or dry heaves; loss of appetite; significant sleep disturbance with inability to fall asleep or to remain asleep. When persistent, significant weight loss is observed due to the complete loss of appetite and sleep deprivation.  Blood pressure and heart rate becomes significantly elevated. Caution: If elevated blood pressure triggers a pounding headache associated with blurred vision, then the patient should immediately seek attention at an urgent or emergency care unit, as these may be signs of an impending stroke.    Emergency Department Pain Levels (6-10/10)  Emergency Room Pain 6 Severely limiting. Requires emergency care and should not be seen or managed at an outpatient pain management facility. Communication becomes difficult and requires great effort. Assistance to reach the emergency department may be required. Facial flushing and profuse sweating along with potentially dangerous increases in heart rate and blood pressure will be evident.   Distressing pain 7 Self-care is very difficult. Assistance is required to transport, or  use restroom. Assistance to reach the emergency department will be required. Tasks requiring coordination, such as bathing and getting dressed become very difficult.   Disabling pain 8 Self-care is no longer possible. At this level, pain is disabling. The individual is unable to do even the most "basic" activities such as walking, eating, bathing, dressing, transferring to a bed, or toileting. Fine motor skills are lost. It is difficult to think clearly.   Incapacitating pain 9 Pain becomes incapacitating. Thought processing is no longer possible. Difficult to remember your own name. Control of movement and coordination are lost.   The worst pain imaginable 10 At this level, most patients pass out from pain. When this level is reached, collapse of the autonomic nervous system occurs, leading to a sudden drop in blood pressure and heart rate. This in turn results in a temporary and dramatic drop in blood flow to the brain, leading to a loss of consciousness. Fainting is one of the body's self defense mechanisms. Passing out puts the brain in a calmed state and causes it to shut down for a while, in order to begin the healing process.    Summary: 1. Refer to this scale when providing [place] with your pain level. 2. Be accurate and careful when reporting your pain level. This will help with your care. 3. Over-reporting your pain level will lead to loss of credibility. 4. Even a level of 1/10 means that there is pain and will be treated at our facility. 5. High, inaccurate reporting will be documented as "Symptom Exaggeration", leading to loss of credibility and suspicions of possible secondary gains such as obtaining more narcotics, or wanting to appear disabled, for fraudulent reasons. 6. Only pain levels of 5 or below will be seen at our facility. 7. Pain levels of 6 and above will be sent to the Emergency Department and the appointment  cancelled. ____________________________________________________________________________________________

## 2018-02-16 NOTE — Progress Notes (Signed)
Results were reviewed and found to be: abnormal  No acute injury or pathology identified  Review would suggest interventional pain management techniques may be of benefit

## 2018-02-19 LAB — 25-HYDROXY VITAMIN D LCMS D2+D3
25-Hydroxy, Vitamin D-2: <1 ng/mL
25-Hydroxy, Vitamin D-3: 52 ng/mL

## 2018-02-19 LAB — COMP. METABOLIC PANEL (12)
A/G RATIO: 1.9 (ref 1.2–2.2)
ALBUMIN: 4.7 g/dL
AST: 23 IU/L (ref 0–40)
Alkaline Phosphatase: 89 IU/L
BILIRUBIN TOTAL: 0.5 mg/dL (ref 0.0–1.2)
BUN / CREAT RATIO: 21
BUN: 23 mg/dL
CHLORIDE: 102 mmol/L (ref 96–106)
Calcium: 10 mg/dL
Creatinine, Ser: 1.12 mg/dL
GLOBULIN, TOTAL: 2.5 g/dL (ref 1.5–4.5)
Glucose: 105 mg/dL — ABNORMAL HIGH (ref 65–99)
POTASSIUM: 4.2 mmol/L (ref 3.5–5.2)
SODIUM: 142 mmol/L (ref 134–144)
TOTAL PROTEIN: 7.2 g/dL (ref 6.0–8.5)

## 2018-02-19 LAB — MAGNESIUM: Magnesium: 2 mg/dL (ref 1.6–2.3)

## 2018-02-19 LAB — VITAMIN B12: Vitamin B-12: 534 pg/mL (ref 232–1245)

## 2018-02-19 LAB — COMPLIANCE DRUG ANALYSIS, UR

## 2018-02-19 LAB — C-REACTIVE PROTEIN: CRP: 0.7 mg/L (ref 0.0–4.9)

## 2018-02-19 LAB — 25-HYDROXYVITAMIN D LCMS D2+D3: 25-HYDROXY, VITAMIN D: 52 ng/mL

## 2018-02-19 LAB — SEDIMENTATION RATE: SED RATE: 12 mm/h

## 2018-02-27 NOTE — Progress Notes (Signed)
[place]  [phone] [phone]  ID: Kelsey Carter OB: [birth year]  MR#: [id number]  VZC#:[id number]  Patient Care Team: Adline Potter, MD as PCP - General (Family Medicine)  CHIEF COMPLAINT: Bilateral breast cancer.  INTERVAL HISTORY: Patient returns to clinic today for routine 47-monthevaluation.  She continues to tolerate anastrozole well without significant side effects. She has persistent peripheral neuropathy, but no neurologic complaints.  She has what sounds like sciatica which is actively being addressed by her primary care physician.  She has a good appetite and denies weight loss. She denies pain. She has no chest pain or shortness of breath. She denies any nausea, vomiting, constipation, or diarrhea. She has no urinary complaints. Patient offers no further specific complaints.  REVIEW OF SYSTEMS:   Review of Systems  Constitutional: Negative.  Negative for fever, malaise/fatigue and weight loss.  Respiratory: Negative.  Negative for cough and shortness of breath.   Cardiovascular: Negative.  Negative for chest pain and leg swelling.  Gastrointestinal: Negative.  Negative for abdominal pain.  Genitourinary: Negative.   Musculoskeletal: Positive for back pain and joint pain.  Skin: Negative.  Negative for rash.  Neurological: Positive for tingling and sensory change. Negative for weakness.  Psychiatric/Behavioral: The patient is nervous/anxious and has insomnia.     As per HPI. Otherwise, a complete review of systems is negative.  PAST MEDICAL HISTORY: Past Medical History:  [place]  [place]   . Hyper[place]   . Hypertension     PAST SURGICAL HISTORY: Past Surgical History:  Procedure Laterality Date  . APPENDECTOMY    . CARDIAC CATHETERIZATION  2018  . CESAREAN SECTION    . MASTECTOMY     5 diff procedures   . TONSILLECTOMY      FAMILY HISTORY: Family History  Problem Relation Age of  Onset  . Hypertension Mother   . Cervical cancer Mother   . Cancer Father   . Breast cancer Paternal Aunt   . Tuberculosis Maternal Grandfather   . Breast cancer Paternal Grandmother     ADVANCED DIRECTIVES (Y/N):  N  HEALTH MAINTENANCE: Social History   Tobacco Use  . Smoking status: Never Smoker  . Smokeless tobacco: Never Used  Substance Use Topics  . Alcohol use: No  . Drug use: No     Colonoscopy:  PAP:  Bone density:  Lipid panel:  Allergies  Allergen Reactions  . Ciprofloxacin Other (See Comments)    Joint pain   . Hydrocodone-Acetaminophen Nausea And Vomiting    Current Outpatient Medications  Medication Sig Dispense Refill  . ALPRAZolam (XANAX) 1 MG tablet Take 1 tablet (1 mg total) by mouth at bedtime as needed for anxiety. 30 tablet 2  . anastrozole (ARIMIDEX) 1 MG tablet Take 1 tablet (1 mg total) by mouth daily. 90 tablet 3  . aspirin (BAYER LOW DOSE) 81 MG EC tablet Take 81 mg by mouth daily. Swallow whole.    .Marland Kitchenatorvastatin (LIPITOR) 40 MG tablet Take 1 tablet (40 mg total) by mouth at bedtime. 90 tablet 3  . Cholecalciferol (VITAMIN D) 2000 units CAPS Take 1 capsule (2,000 Units total) by mouth daily. 30 capsule   . ibuprofen (ADVIL,MOTRIN) 800 MG tablet Take 800 mg by mouth every 8 (eight) hours as needed.    .Marland Kitchenlosartan-hydrochlorothiazide (HYZAAR) 100-12.5 MG tablet Take 1 tablet by mouth daily. 90 tablet 3  . metoprolol succinate (TOPROL-XL) 50 MG 24 hr tablet Take 1 tablet (50 mg  total) by mouth daily. (Patient taking differently: Take 50 mg by mouth daily. ) 90 tablet 3  . alendronate (FOSAMAX) 70 MG tablet Take 1 tablet (70 mg total) by mouth once a week. Take with a full glass of water on an empty stomach. 30 tablet 6   No current facility-administered medications for this visit.     OBJECTIVE: Vitals:   02/28/18 1537  BP: (!) 152/95  Pulse: 93  Resp: 18  Temp: (!) 96.5 F (35.8 C)     Body mass index is 31.17 kg/m.    ECOG FS:0 -  Asymptomatic  General: Well-developed, well-nourished, no acute distress. Eyes: Pink conjunctiva, anicteric sclera. Breasts: Bilateral mastectomy. Lungs: Clear to auscultation bilaterally. Heart: Regular rate and rhythm. No rubs, murmurs, or gallops. Abdomen: Soft, nontender, nondistended. No organomegaly noted, normoactive bowel sounds. Musculoskeletal: No edema, cyanosis, or clubbing. Neuro: Alert, answering all questions appropriately. Cranial nerves grossly intact. Skin: No rashes or petechiae noted. Psych: Normal affect.   LAB RESULTS:  Lab Results  Component Value Date   NA 142 02/15/2018   K 4.2 02/15/2018   CL 102 02/15/2018   CO2 24 08/24/2017   GLUCOSE 105 (H) 02/15/2018   BUN 23 02/15/2018   CREATININE 1.12 02/15/2018   CALCIUM 10.0 02/15/2018   PROT 7.2 02/15/2018   ALBUMIN 4.7 02/15/2018   AST 23 02/15/2018   ALT 22 08/24/2017   ALKPHOS 89 02/15/2018   BILITOT 0.5 02/15/2018   GFRNONAA CANCELED 02/15/2018   GFRAA CANCELED 02/15/2018    Lab Results  Component Value Date   WBC 5.9 08/24/2017   NEUTROABS 2.4 01/27/2013   HGB 12.6 08/24/2017   HCT 36.4 08/24/2017   MCV 89 08/24/2017   PLT 251 08/24/2017     STUDIES: Dg Lumbar Spine Complete W/bend  Result Date: 02/15/2018 CLINICAL DATA:  Chronic low back pain with left-sided sciatic symptoms. EXAM: LUMBAR SPINE - COMPLETE WITH BENDING VIEWS COMPARISON:  Lumbar spine CT 03/16/2010. FINDINGS: Five lumbar type vertebral bodies. There is a stable convex left scoliosis. In the lateral projection, there is straightening of the usual lordosis without focal angulation or significant listhesis. Lateral flexion extension views were obtained and demonstrate limited range of motion, but no dynamic instability. There is multilevel spondylosis with disc space loss, endplate osteophytes and facet hypertrophy. Spondylosis has progressed from 2011. There is diffuse aortic and branch vessel atherosclerosis. IMPRESSION:  Progressive spondylosis since 2011. Limited range of motion on flexion extension views. No acute findings or dynamic instability. Electronically Signed   By: Richardean Sale M.D.   On: 02/15/2018 15:34   Dg C-arm 1-60 Min-no Report  Result Date: 03/03/2018 Fluoroscopy was utilized by the requesting physician.  No radiographic interpretation.    ASSESSMENT: Bilateral breast cancer.  PLAN:    1. Bilateral breast cancer: Patient has a history of a pathologic stage IA adenocarcinoma of the left breast which was ER and HER-2 negative, PR 5%. Patient received chemotherapy and XRT for her initial breast cancer.  In May ~2015 she was diagnosed with a second breast cancer in [place] that was noted to be ER/PR positive and HER-2 negative. Patient underwent bilateral mastectomy and therefore did not require adjuvant XRT. She was initiated on anastrozole and will complete 5 years of treatment in May 2020. Patient does not require any further mammograms. Return to clinic in 6 months for further evaluation. 2. Anxiety/insomnia: Patient was given a prescription for Xanax today. 3.  Peripheral neuropathy: Patient does not wish any  intervention at this time. 4.  Osteoporosis: Patient had a bone marrow density on October 06, 2017 revealed a T score of -2.8.  She was given a prescription for Fosamax and instructed to continue taking calcium and vitamin D supplementation.  Repeat in October 2019.  Approximately 30 minutes was spent in discussion of which greater than 50% was consultation.  Patient expressed understanding and was in agreement with this plan. She also understands that She can call clinic at any time with any questions, concerns, or complaints.    Lloyd Huger, MD   03/04/2018 12:43 PM

## 2018-02-28 ENCOUNTER — Inpatient Hospital Stay: Payer: PPO | Attending: Oncology | Admitting: Oncology

## 2018-02-28 ENCOUNTER — Other Ambulatory Visit: Payer: Self-pay

## 2018-02-28 VITALS — BP 152/95 | HR 93 | Temp 96.5°F | Resp 18 | Wt 181.6 lb

## 2018-02-28 DIAGNOSIS — I10 Essential (primary) hypertension: Secondary | ICD-10-CM | POA: Insufficient documentation

## 2018-02-28 DIAGNOSIS — Z9221 Personal history of antineoplastic chemotherapy: Secondary | ICD-10-CM

## 2018-02-28 DIAGNOSIS — G629 Polyneuropathy, unspecified: Secondary | ICD-10-CM | POA: Diagnosis not present

## 2018-02-28 DIAGNOSIS — C50911 Malignant neoplasm of unspecified site of right female breast: Secondary | ICD-10-CM

## 2018-02-28 DIAGNOSIS — Z79899 Other long term (current) drug therapy: Secondary | ICD-10-CM

## 2018-02-28 DIAGNOSIS — Z9013 Acquired absence of bilateral breasts and nipples: Secondary | ICD-10-CM

## 2018-02-28 DIAGNOSIS — Z923 Personal history of irradiation: Secondary | ICD-10-CM | POA: Diagnosis not present

## 2018-02-28 DIAGNOSIS — C50912 Malignant neoplasm of unspecified site of left female breast: Secondary | ICD-10-CM

## 2018-02-28 DIAGNOSIS — Z79811 Long term (current) use of aromatase inhibitors: Secondary | ICD-10-CM

## 2018-02-28 DIAGNOSIS — F419 Anxiety disorder, unspecified: Secondary | ICD-10-CM

## 2018-02-28 DIAGNOSIS — M810 Age-related osteoporosis without current pathological fracture: Secondary | ICD-10-CM

## 2018-02-28 DIAGNOSIS — Z853 Personal history of malignant neoplasm of breast: Secondary | ICD-10-CM | POA: Diagnosis not present

## 2018-02-28 DIAGNOSIS — Z7982 Long term (current) use of aspirin: Secondary | ICD-10-CM | POA: Diagnosis not present

## 2018-02-28 DIAGNOSIS — Z170 Estrogen receptor positive status [ER+]: Secondary | ICD-10-CM

## 2018-02-28 DIAGNOSIS — G4700 Insomnia, unspecified: Secondary | ICD-10-CM | POA: Diagnosis not present

## 2018-02-28 DIAGNOSIS — E785 Hyperlipidemia, unspecified: Secondary | ICD-10-CM | POA: Insufficient documentation

## 2018-02-28 MED ORDER — ALENDRONATE SODIUM 70 MG PO TABS: 70.0000 mg | ORAL_TABLET | ORAL | 6 refills | Status: DC

## 2018-02-28 NOTE — Progress Notes (Signed)
Here for follow up. Primary complaint w back/leg pain-seeing pain mgt on [facility] 03/02/18

## 2018-03-01 NOTE — Progress Notes (Signed)
Patient's Name: Kelsey Carter  MRN: [id number]  Referring Provider: Adline Potter, MD  DOB: [birth year]  PCP: Adline Potter, MD  DOS: 03/02/2018  Note by: Gaspar Cola, MD  Service setting: Ambulatory outpatient  Specialty: Interventional Pain Management  Location: [facility] (AMB) Pain Management Facility    Patient type: Established   Primary Reason(s) for Visit: Encounter for evaluation before starting new chronic pain management plan of care (Level of risk: moderate) CC: Back Pain (left, lower)  HPI  Kelsey Carter is a 72 y.o. year old, female patient, who comes today for a follow-up evaluation to review the test results and decide on a treatment plan. She has Hypertension; Cardiomyopathy due to chemotherapy [facility]); Insomnia; Peripheral neuropathy due to chemotherapy ([place]); Bilateral breast cancer ([place]); Onychomycosis; Overweight (BMI 25.0-29.9); Vitamin D deficiency; Hyperlipidemia; Elevated TSH; Benign essential HTN; Coronary artery disease involving native coronary artery of native heart; Hyperlipidemia, mixed; Chronic low back pain (Primary Area of Pain) (Bilateral) (L>R) with left-sided sciatica; Chronic lower extremity pain (Secondary Area of Pain) (Left); Chronic pain syndrome; Long term prescription benzodiazepine use; Pharmacologic therapy; Disorder of skeletal system; Problems influencing health status; Chronic sacroiliac joint pain; and DDD (degenerative disc disease), lumbar on their problem list. Her primarily concern today is the Back Pain (left, lower)  Pain Assessment: Location: Left, Lower Back Radiating: lefft buttock, leg, down to the foot, with numbness in leg sometimes Onset: More than a month ago Duration: Chronic pain Quality: Shooting Severity: 7 /10 (self-reported pain score)  Note: Reported level is inconsistent with clinical observations. Clinically the patient looks like a 3/10 A 3/10 is viewed as "Moderate" and described as significantly interfering with  activities of daily living (ADL). It becomes difficult to feed, bathe, get dressed, get on and off the toilet or to perform personal hygiene functions. Difficult to get in and out of bed or a chair without assistance. Very distracting. With effort, it can be ignored when deeply involved in activities. Information on the proper use of the pain scale provided to the patient today. When using our objective Pain Scale, levels between 6 and 10/10 are said to belong in an emergency room, as it progressively worsens from a 6/10, described as severely limiting, requiring emergency care not usually available at an outpatient pain management facility. At a 6/10 level, communication becomes difficult and requires great effort. Assistance to reach the emergency department may be required. Facial flushing and profuse sweating along with potentially dangerous increases in heart rate and blood pressure will be evident. Timing: Constant Modifying factors: nothing  Kelsey Carter comes in today for a follow-up visit after her initial evaluation on 02/15/2018. Today we went over the results of her tests. These were explained in "Layman's terms". During today's appointment we went over my diagnostic impression, as well as the proposed treatment plan.  According to the patient her primary area of pain is in her lower extremities. She admits that the left is greater than the right (L>R). She has numbness tingling and weakness that goes down the back of her leg into her entire foot. She admits that she does have peripheral neuropathy secondary to chemotherapy treatments.  Her second area of pain is in her lower back. She admits that his left sided. He admits that she did have previous right-sided back pain the past however that is resolved. She denies any previous surgery. She has had interventional therapy on the right which was effective. She denies recent physical therapy or images.  In considering  the treatment plan options,  Kelsey Carter was reminded that I no longer take patients for medication management only. I asked her to let me know if she had no intention of taking advantage of the interventional therapies, so that we could make arrangements to provide this space to someone interested. I also made it clear that undergoing interventional therapies for the purpose of getting pain medications is very inappropriate on the part of a patient, and it will not be tolerated in this practice. This type of behavior would suggest true addiction and therefore it requires referral to an addiction specialist.   Further details on both, my assessment(s), as well as the proposed treatment plan, please see below.  Controlled Substance Pharmacotherapy Assessment REMS (Risk Evaluation and Mitigation Strategy)  Analgesic: None Highest recorded MME/day: 0 mg/day MME/day: 0 mg/day Pill Count: None expected due to no prior prescriptions written by our practice. Kelsey Martins, Kelsey Carter  03/02/2018 10:37 AM  Sign at close encounter Safety precautions to be maintained throughout the outpatient stay will include: orient to surroundings, keep bed in low position, maintain call bell within reach at all times, provide assistance with transfer out of bed and ambulation.    Pharmacokinetics: Liberation and absorption (onset of action): WNL Distribution (time to peak effect): WNL Metabolism and excretion (duration of action): WNL         Pharmacodynamics: Desired effects: Analgesia: Kelsey Carter reports >50% benefit. Functional ability: Patient reports that medication allows her to accomplish basic ADLs Clinically meaningful improvement in function (CMIF): Sustained CMIF goals met Perceived effectiveness: Described as relatively effective, allowing for increase in activities of daily living (ADL) Undesirable effects: Side-effects or Adverse reactions: None reported Monitoring: [place] PMP: Online review of the past 77-monthperiod previously  conducted. Not applicable at this point since we have not taken over the patient's medication management yet. List of other Serum/Urine Drug Screening Test(s):  No results found for: AMPHSCRSER, BARBSCRSER, BENZOSCRSER, COCAINSCRSER, COCAINSCRNUR, PCPSCRSER, THCSCRSER, THCU, CANNABQUANT, OB[place] OF[place] [facility] Heart [place] of all UDS test(s) done:  Lab Results  Component Value Date   SUMMARY FINAL 02/15/2018   Last UDS on record: Summary  Date Value Ref Range Status  02/15/2018 FINAL  Final    Comment:    ==================================================================== TOXASSURE COMP DRUG ANALYSIS,UR ==================================================================== Test                             Result       Flag       Units Drug Present and Declared for Prescription Verification   Alprazolam                     121          EXPECTED   ng/mg creat   Alpha-hydroxyalprazolam        929          EXPECTED   ng/mg creat    Source of alprazolam is a scheduled prescription medication.    Alpha-hydroxyalprazolam is an expected metabolite of alprazolam.   Ibuprofen                      PRESENT      EXPECTED   Metoprolol                     PRESENT      EXPECTED Drug Present not Declared for Prescription Verification   Acetaminophen  PRESENT      UNEXPECTED   Naproxen                       PRESENT      UNEXPECTED   Lidocaine                      PRESENT      UNEXPECTED Drug Absent but Declared for Prescription Verification   Salicylate                     Not Detected UNEXPECTED    Aspirin, as indicated in the declared medication list, is not    always detected even when used as directed. ==================================================================== Test                      Result    Flag   Units      Ref Range   Creatinine              203              mg/dL      >=20 ==================================================================== Declared  Medications:  The flagging and interpretation on this report are based on the  following declared medications.  Unexpected results may arise from  inaccuracies in the declared medications.  **Note: The testing scope of this panel includes these medications:  Alprazolam  Metoprolol  **Note: The testing scope of this panel does not include small to  moderate amounts of these reported medications:  Aspirin (Aspirin 81)  Ibuprofen  **Note: The testing scope of this panel does not include following  reported medications:  Anastrozole  Atorvastatin  Hydrochlorothiazide (Losartan-HCTZ)  Losartan (Losartan-HCTZ)  Vitamin D ==================================================================== For clinical consultation, please call [phone]. ====================================================================    UDS interpretation: No unexpected findings. Patient informed of the CDC guidelines and recommendations to stay away from the concomitant use of benzodiazepines and opioids due to the increased risk of respiratory depression and death. Medication Assessment Form: Patient introduced to form today Treatment compliance: Treatment may start today if patient agrees with proposed plan. Evaluation of compliance is not applicable at this point Risk Assessment Profile: Aberrant behavior: See initial evaluations. None observed or detected today Comorbid factors increasing risk of overdose: See initial evaluation. No additional risks detected today Medical Psychology Evaluation: Please see scanned results in medical record. Opioid Risk Tool - 02/15/18 0821      Family History of Substance Abuse   Alcohol  Negative    Illegal Drugs  Negative    Rx Drugs  Negative      Personal History of Substance Abuse   Alcohol  Negative    Illegal Drugs  Negative    Rx Drugs  Negative      Age   Age between 40-45 years   No      Psychological Disease   Psychological Disease  Negative     Depression  Negative      Total Score   Opioid Risk Tool Scoring  0    Opioid Risk Interpretation  Low Risk      ORT Scoring interpretation table:  Score <3 = Low Risk for SUD  Score between 4-7 = Moderate Risk for SUD  Score >8 = High Risk for Opioid Abuse   Risk Mitigation Strategies:  Patient opioid safety counseling: Not applicable. Patient-Prescriber Agreement (PPA): No agreement signed.  Controlled substance notification to other providers: Not applicable  Pharmacologic Plan: The patient has expressed her desire to avoid opioids as much as possible.             Laboratory Chemistry  Inflammation Markers (CRP: Acute Phase) (ESR: Chronic Phase) Lab Results  Component Value Date   CRP 0.7 02/15/2018   ESRSEDRATE 12 02/15/2018                         Renal Function Markers Lab Results  Component Value Date   BUN 23 02/15/2018   CREATININE 1.12 02/15/2018   GFRAA CANCELED 02/15/2018   GFRNONAA CANCELED 02/15/2018                 Hepatic Function Markers Lab Results  Component Value Date   AST 23 02/15/2018   ALT 22 08/24/2017   ALBUMIN 4.7 02/15/2018   ALKPHOS 89 02/15/2018                 Electrolytes Lab Results  Component Value Date   NA 142 02/15/2018   K 4.2 02/15/2018   CL 102 02/15/2018   CALCIUM 10.0 02/15/2018   MG 2.0 02/15/2018                        Neuropathy Markers Lab Results  Component Value Date   VITAMINB12 534 02/15/2018                 Bone Pathology Markers Lab Results  Component Value Date   VD25OH 87.0 08/24/2017   25OHVITD1 52 02/15/2018   25OHVITD2 <1.0 02/15/2018   25OHVITD3 52 02/15/2018                         Coagulation Parameters Lab Results  Component Value Date   INR 1.0 12/27/2012   LABPROT 13.2 12/27/2012   APTT 27.9 12/27/2012   PLT 251 08/24/2017                 Cardiovascular Markers Lab Results  Component Value Date   CKTOTAL 115 10/04/2013   CKMB 1.4 10/04/2013   TROPONINI 0.02 10/04/2013    HGB 12.6 08/24/2017   HCT 36.4 08/24/2017                 CA Markers Lab Results  Component Value Date   LABCA2 32.5 08/11/2013                 Note: Lab results reviewed.  Recent Diagnostic Imaging Review  Lumbosacral Imaging: Lumbar DG Bending views:  Results for orders placed during the hospital encounter of 02/15/18  DG Lumbar Spine Complete W/Bend   Narrative CLINICAL DATA:  Chronic low back pain with left-sided sciatic symptoms.  EXAM: LUMBAR SPINE - COMPLETE WITH BENDING VIEWS  COMPARISON:  Lumbar spine CT 03/16/2010.  FINDINGS: Five lumbar type vertebral bodies. There is a stable convex left scoliosis. In the lateral projection, there is straightening of the usual lordosis without focal angulation or significant listhesis. Lateral flexion extension views were obtained and demonstrate limited range of motion, but no dynamic instability. There is multilevel spondylosis with disc space loss, endplate osteophytes and facet hypertrophy. Spondylosis has progressed from 2011. There is diffuse aortic and branch vessel atherosclerosis.  IMPRESSION: Progressive spondylosis since 2011. Limited range of motion on flexion extension views. No acute findings or dynamic instability.   Electronically Signed   By: Richardean Sale M.D.   On: 02/15/2018 15:34  Complexity Note: Imaging results reviewed. Results shared with Kelsey Carter, using Layman's terms.                         Meds   Current Outpatient Medications:  .  alendronate (FOSAMAX) 70 MG tablet, Take 1 tablet (70 mg total) by mouth once a week. Take with a full glass of water on an empty stomach., Disp: 30 tablet, Rfl: 6 .  ALPRAZolam (XANAX) 1 MG tablet, Take 1 tablet (1 mg total) by mouth at bedtime as needed for anxiety., Disp: 30 tablet, Rfl: 2 .  anastrozole (ARIMIDEX) 1 MG tablet, Take 1 tablet (1 mg total) by mouth daily., Disp: 90 tablet, Rfl: 3 .  aspirin (BAYER LOW DOSE) 81 MG EC tablet, Take 81 mg  by mouth daily. Swallow whole., Disp: , Rfl:  .  atorvastatin (LIPITOR) 40 MG tablet, Take 1 tablet (40 mg total) by mouth at bedtime., Disp: 90 tablet, Rfl: 3 .  Cholecalciferol (VITAMIN D) 2000 units CAPS, Take 1 capsule (2,000 Units total) by mouth daily., Disp: 30 capsule, Rfl:  .  ibuprofen (ADVIL,MOTRIN) 800 MG tablet, Take 800 mg by mouth every 8 (eight) hours as needed., Disp: , Rfl:  .  losartan-hydrochlorothiazide (HYZAAR) 100-12.5 MG tablet, Take 1 tablet by mouth daily., Disp: 90 tablet, Rfl: 3 .  metoprolol succinate (TOPROL-XL) 50 MG 24 hr tablet, Take 1 tablet (50 mg total) by mouth daily. (Patient taking differently: Take 50 mg by mouth daily. ), Disp: 90 tablet, Rfl: 3 .  gabapentin (NEURONTIN) 100 MG capsule, Take 1 capsule (100 mg total) by mouth 3 (three) times daily., Disp: 90 capsule, Rfl: 0 .  predniSONE (STERAPRED UNI-PAK 21 TAB) 10 MG (21) TBPK tablet, Take by mouth daily. Take 6 tabs by mouth daily  for 2 days, then 5 tabs for 2 days, then 4 tabs for 2 days, then 3 tabs for 2 days, 2 tabs for 2 days, then 1 tab by mouth daily for 2 days, Disp: 42 tablet, Rfl: 0  ROS  Constitutional: Denies any fever or chills Gastrointestinal: No reported hemesis, hematochezia, vomiting, or acute GI distress Musculoskeletal: Denies any acute onset joint swelling, redness, loss of ROM, or weakness Neurological: No reported episodes of acute onset apraxia, aphasia, dysarthria, agnosia, amnesia, paralysis, loss of coordination, or loss of consciousness  Allergies  Kelsey Carter is allergic to ciprofloxacin and hydrocodone-acetaminophen.  PFSH  Drug: Kelsey Carter  reports that she does not use drugs. Alcohol:  reports that she does not drink alcohol. Tobacco:  reports that she has never smoked. She has never used smokeless tobacco. Medical:  has a past medical history of Breast cancer ([place]), Hyperlipidemia, and Hypertension. Surgical: Kelsey Carter  has a past surgical history that  includes Tonsillectomy; Cardiac catheterization (2018); Cesarean section; Mastectomy; and Appendectomy. Family: family history includes Breast cancer in her paternal aunt and paternal grandmother; Cancer in her father; Cervical cancer in her mother; Hypertension in her mother; Tuberculosis in her maternal grandfather.  Constitutional Exam  General appearance: Well nourished, well developed, and well hydrated. In no apparent acute distress Vitals:   03/02/18 1031  BP: (!) 141/73  Pulse: 90  Resp: 16  Temp: 97.9 F (36.6 C)  TempSrc: Oral  SpO2: 99%  Weight: 181 lb (82.1 kg)  Height: '5\' 5"'$  (1.651 m)   BMI Assessment: Estimated body mass index is 30.12 kg/m as calculated from the following:   Height as of this encounter: 5'  5" (1.651 m).   Weight as of this encounter: 181 lb (82.1 kg).  BMI interpretation table: BMI level Category Range association with higher incidence of chronic pain  <18 kg/m2 Underweight   18.5-24.9 kg/m2 Ideal body weight   25-29.9 kg/m2 Overweight Increased incidence by 20%  30-34.9 kg/m2 Obese (Class I) Increased incidence by 68%  35-39.9 kg/m2 Severe obesity (Class II) Increased incidence by 136%  >40 kg/m2 Extreme obesity (Class III) Increased incidence by 254%   BMI Readings from Last 4 Encounters:  03/08/18 29.95 kg/m  03/03/18 29.95 kg/m  03/02/18 30.12 kg/m  02/28/18 31.17 kg/m   Wt Readings from Last 4 Encounters:  03/08/18 180 lb (81.6 kg)  03/03/18 180 lb (81.6 kg)  03/02/18 181 lb (82.1 kg)  02/28/18 181 lb 9.6 oz (82.4 kg)  Psych/Mental status: Alert, oriented x 3 (person, place, & time)       Eyes: PERLA Respiratory: No evidence of acute respiratory distress  Cervical Spine Area Exam  Skin & Axial Inspection: No masses, redness, edema, swelling, or associated skin lesions Alignment: Symmetrical Functional ROM: Unrestricted ROM      Stability: No instability detected Muscle Tone/Strength: Functionally intact. No obvious  neuro-muscular anomalies detected. Sensory (Neurological): Unimpaired Palpation: No palpable anomalies              Upper Extremity (UE) Exam    Side: Right upper extremity  Side: Left upper extremity  Skin & Extremity Inspection: Skin color, temperature, and hair growth are WNL. No peripheral edema or cyanosis. No masses, redness, swelling, asymmetry, or associated skin lesions. No contractures.  Skin & Extremity Inspection: Skin color, temperature, and hair growth are WNL. No peripheral edema or cyanosis. No masses, redness, swelling, asymmetry, or associated skin lesions. No contractures.  Functional ROM: Unrestricted ROM          Functional ROM: Unrestricted ROM          Muscle Tone/Strength: Functionally intact. No obvious neuro-muscular anomalies detected.  Muscle Tone/Strength: Functionally intact. No obvious neuro-muscular anomalies detected.  Sensory (Neurological): Unimpaired          Sensory (Neurological): Unimpaired          Palpation: No palpable anomalies              Palpation: No palpable anomalies              Specialized Test(s): Deferred         Specialized Test(s): Deferred          Thoracic Spine Area Exam  Skin & Axial Inspection: No masses, redness, or swelling Alignment: Symmetrical Functional ROM: Unrestricted ROM Stability: No instability detected Muscle Tone/Strength: Functionally intact. No obvious neuro-muscular anomalies detected. Sensory (Neurological): Unimpaired Muscle strength & Tone: No palpable anomalies  Lumbar Spine Area Exam  Skin & Axial Inspection: No masses, redness, or swelling Alignment: Symmetrical Functional ROM: Decreased ROM      Stability: No instability detected Muscle Tone/Strength: Functionally intact. No obvious neuro-muscular anomalies detected. Sensory (Neurological): Unimpaired Palpation: No palpable anomalies       Provocative Tests: Lumbar Hyperextension and rotation test: evaluation deferred today       Lumbar Lateral bending  test: evaluation deferred today       Patrick's Maneuver: evaluation deferred today                    Gait & Posture Assessment  Ambulation: Limited Gait: Antalgic Posture: Antalgic   Lower Extremity Exam    Side:  Right lower extremity  Side: Left lower extremity  Skin & Extremity Inspection: Skin color, temperature, and hair growth are WNL. No peripheral edema or cyanosis. No masses, redness, swelling, asymmetry, or associated skin lesions. No contractures.  Skin & Extremity Inspection: Skin color, temperature, and hair growth are WNL. No peripheral edema or cyanosis. No masses, redness, swelling, asymmetry, or associated skin lesions. No contractures.  Functional ROM: Unrestricted ROM          Functional ROM: Unrestricted ROM          Muscle Tone/Strength: Able to Toe-walk & Heel-walk without problems  Muscle Tone/Strength: Able to Toe-walk & Heel-walk without problems  Sensory (Neurological): Unimpaired  Sensory (Neurological): Dermatomal pain pattern  Palpation: No palpable anomalies  Palpation: No palpable anomalies   Assessment & Plan  Primary Diagnosis & Pertinent Problem List: The primary encounter diagnosis was Chronic pain syndrome. Diagnoses of Chronic low back pain (Primary Area of Pain) (Bilateral) (L>R) with left-sided sciatica, Chronic lower extremity pain (Secondary Area of Pain) (Left), Chronic sacroiliac joint pain, Peripheral neuropathy due to chemotherapy [facility] ), Disorder of skeletal system, Problems influencing health status, Pharmacologic therapy, Long term prescription benzodiazepine use, Vitamin D deficiency, and DDD (degenerative disc disease), lumbar were also pertinent to this visit.  Visit Diagnosis: 1. Chronic pain syndrome   2. Chronic low back pain (Primary Area of Pain) (Bilateral) (L>R) with left-sided sciatica   3. Chronic lower extremity pain (Secondary Area of Pain) (Left)   4. Chronic sacroiliac joint pain   5. Peripheral neuropathy due to chemotherapy  ([place])   6. Disorder of skeletal system   7. Problems influencing health status   8. Pharmacologic therapy   9. Long term prescription benzodiazepine use   10. Vitamin D deficiency   11. DDD (degenerative disc disease), lumbar    Problems updated and reviewed during this visit: No problems updated.  Plan of Care  Pharmacotherapy (Medications Ordered): No orders of the defined types were placed in this encounter.   Procedure Orders     Lumbar Epidural Injection Lab Orders  No laboratory test(s) ordered today   Imaging Orders  No imaging studies ordered today   Referral Orders  No referral(s) requested today    Pharmacological management options:  Opioid Analgesics: Not indicated at this time Membrane stabilizer: None prescribed at this time Muscle relaxant: None prescribed at this time NSAID: None prescribed at this time Other analgesic(s): None prescribed at this time   Interventional management options: Planned, scheduled, and/or pending:    Diagnostic left-sided L4-5 LESI #1 under fluoroscopic guidance, no sedation   Considering:   Diagnostic left sided LESI Diagnostic bilateral Sacroiliac joint injection Diagnostic left sided lumbar facet nerve block Possible left-sided lumbar facet RFA    PRN Procedures:   None at this time   Provider-requested follow-up: Return for Procedure (no sedation): (L) L4-5 LESI #1.  Future Appointments  Date Time Provider [place]  03/16/2018  1:30 PM [place] ADVISOR [facility] None  03/22/2018  9:00 AM Adline Potter, MD [facility] None  03/28/2018 10:15 AM Milinda Pointer, MD [facility] None  09/13/2018  2:45 PM Grayland Ormond, Kathlene November, MD [facility] None    Primary Care Physician: Adline Potter, MD Location: [facility] Note by: Gaspar Cola, MD Date: 03/02/2018; Time: 11:52 AM

## 2018-03-02 ENCOUNTER — Encounter: Payer: Self-pay | Admitting: Pain Medicine

## 2018-03-02 ENCOUNTER — Other Ambulatory Visit: Payer: Self-pay

## 2018-03-02 ENCOUNTER — Ambulatory Visit: Payer: PPO | Attending: Pain Medicine | Admitting: Pain Medicine

## 2018-03-02 VITALS — BP 141/73 | HR 90 | Temp 97.9°F | Resp 16 | Ht 65.0 in | Wt 181.0 lb

## 2018-03-02 DIAGNOSIS — E559 Vitamin D deficiency, unspecified: Secondary | ICD-10-CM

## 2018-03-02 DIAGNOSIS — Z789 Other specified health status: Secondary | ICD-10-CM

## 2018-03-02 DIAGNOSIS — M79605 Pain in left leg: Secondary | ICD-10-CM

## 2018-03-02 DIAGNOSIS — T451X5A Adverse effect of antineoplastic and immunosuppressive drugs, initial encounter: Secondary | ICD-10-CM

## 2018-03-02 DIAGNOSIS — Z9010 Acquired absence of unspecified breast and nipple: Secondary | ICD-10-CM | POA: Diagnosis not present

## 2018-03-02 DIAGNOSIS — Z79899 Other long term (current) drug therapy: Secondary | ICD-10-CM | POA: Diagnosis not present

## 2018-03-02 DIAGNOSIS — M533 Sacrococcygeal disorders, not elsewhere classified: Secondary | ICD-10-CM | POA: Diagnosis not present

## 2018-03-02 DIAGNOSIS — M5136 Other intervertebral disc degeneration, lumbar region: Secondary | ICD-10-CM

## 2018-03-02 DIAGNOSIS — Z7982 Long term (current) use of aspirin: Secondary | ICD-10-CM | POA: Insufficient documentation

## 2018-03-02 DIAGNOSIS — Z881 Allergy status to other antibiotic agents status: Secondary | ICD-10-CM | POA: Diagnosis not present

## 2018-03-02 DIAGNOSIS — G8929 Other chronic pain: Secondary | ICD-10-CM | POA: Diagnosis not present

## 2018-03-02 DIAGNOSIS — Z9889 Other specified postprocedural states: Secondary | ICD-10-CM | POA: Diagnosis not present

## 2018-03-02 DIAGNOSIS — M5442 Lumbago with sciatica, left side: Secondary | ICD-10-CM | POA: Diagnosis not present

## 2018-03-02 DIAGNOSIS — Z885 Allergy status to narcotic agent status: Secondary | ICD-10-CM | POA: Insufficient documentation

## 2018-03-02 DIAGNOSIS — M899 Disorder of bone, unspecified: Secondary | ICD-10-CM

## 2018-03-02 DIAGNOSIS — G894 Chronic pain syndrome: Secondary | ICD-10-CM | POA: Diagnosis not present

## 2018-03-02 DIAGNOSIS — M51369 Other intervertebral disc degeneration, lumbar region without mention of lumbar back pain or lower extremity pain: Secondary | ICD-10-CM | POA: Insufficient documentation

## 2018-03-02 DIAGNOSIS — G620 Drug-induced polyneuropathy: Secondary | ICD-10-CM | POA: Diagnosis not present

## 2018-03-02 NOTE — Progress Notes (Signed)
Safety precautions to be maintained throughout the outpatient stay will include: orient to surroundings, keep bed in low position, maintain call bell within reach at all times, provide assistance with transfer out of bed and ambulation.  

## 2018-03-02 NOTE — Patient Instructions (Addendum)
____________________________________________________________________________________________  Pain Scale  Introduction: The pain score used by this practice is the Verbal Numerical Rating Scale (VNRS-11). This is an 11-point scale. It is for adults and children 10 years or older. There are significant differences in how the pain score is reported, used, and applied. Forget everything you learned in the past and learn this scoring system.  General Information: The scale should reflect your current level of pain. Unless you are specifically asked for the level of your worst pain, or your average pain. If you are asked for one of these two, then it should be understood that it is over the past 24 hours.  Basic Activities of Daily Living (ADL): Personal hygiene, dressing, eating, transferring, and using restroom.  Instructions: Most patients tend to report their level of pain as a combination of two factors, their physical pain and their psychosocial pain. This last one is also known as "suffering" and it is reflection of how physical pain affects you socially and psychologically. From now on, report them separately. From this point on, when asked to report your pain level, report only your physical pain. Use the following table for reference.  Pain Clinic Pain Levels (0-5/10)  Pain Level Score  Description  No Pain 0   Mild pain 1 Nagging, annoying, but does not interfere with basic activities of daily living (ADL). Patients are able to eat, bathe, get dressed, toileting (being able to get on and off the toilet and perform personal hygiene functions), transfer (move in and out of bed or a chair without assistance), and maintain continence (able to control bladder and bowel functions). Blood pressure and heart rate are unaffected. A normal heart rate for a healthy adult ranges from 60 to 100 bpm (beats per minute).   Mild to moderate pain 2 Noticeable and distracting. Impossible to hide from other  people. More frequent flare-ups. Still possible to adapt and function close to normal. It can be very annoying and may have occasional stronger flare-ups. With discipline, patients may get used to it and adapt.   Moderate pain 3 Interferes significantly with activities of daily living (ADL). It becomes difficult to feed, bathe, get dressed, get on and off the toilet or to perform personal hygiene functions. Difficult to get in and out of bed or a chair without assistance. Very distracting. With effort, it can be ignored when deeply involved in activities.   Moderately severe pain 4 Impossible to ignore for more than a few minutes. With effort, patients may still be able to manage work or participate in some social activities. Very difficult to concentrate. Signs of autonomic nervous system discharge are evident: dilated pupils (mydriasis); mild sweating (diaphoresis); sleep interference. Heart rate becomes elevated (>115 bpm). Diastolic blood pressure (lower number) rises above 100 mmHg. Patients find relief in laying down and not moving.   Severe pain 5 Intense and extremely unpleasant. Associated with frowning face and frequent crying. Pain overwhelms the senses.  Ability to do any activity or maintain social relationships becomes significantly limited. Conversation becomes difficult. Pacing back and forth is common, as getting into a comfortable position is nearly impossible. Pain wakes you up from deep sleep. Physical signs will be obvious: pupillary dilation; increased sweating; goosebumps; brisk reflexes; cold, clammy hands and feet; nausea, vomiting or dry heaves; loss of appetite; significant sleep disturbance with inability to fall asleep or to remain asleep. When persistent, significant weight loss is observed due to the complete loss of appetite and sleep deprivation.  Blood   pressure and heart rate becomes significantly elevated. Caution: If elevated blood pressure triggers a pounding headache  associated with blurred vision, then the patient should immediately seek attention at an urgent or emergency care unit, as these may be signs of an impending stroke.    Emergency Department Pain Levels (6-10/10)  Emergency Room Pain 6 Severely limiting. Requires emergency care and should not be seen or managed at an outpatient pain management facility. Communication becomes difficult and requires great effort. Assistance to reach the emergency department may be required. Facial flushing and profuse sweating along with potentially dangerous increases in heart rate and blood pressure will be evident.   Distressing pain 7 Self-care is very difficult. Assistance is required to transport, or use restroom. Assistance to reach the emergency department will be required. Tasks requiring coordination, such as bathing and getting dressed become very difficult.   Disabling pain 8 Self-care is no longer possible. At this level, pain is disabling. The individual is unable to do even the most "basic" activities such as walking, eating, bathing, dressing, transferring to a bed, or toileting. Fine motor skills are lost. It is difficult to think clearly.   Incapacitating pain 9 Pain becomes incapacitating. Thought processing is no longer possible. Difficult to remember your own name. Control of movement and coordination are lost.   The worst pain imaginable 10 At this level, most patients pass out from pain. When this level is reached, collapse of the autonomic nervous system occurs, leading to a sudden drop in blood pressure and heart rate. This in turn results in a temporary and dramatic drop in blood flow to the brain, leading to a loss of consciousness. Fainting is one of the body's self defense mechanisms. Passing out puts the brain in a calmed state and causes it to shut down for a while, in order to begin the healing process.    Summary: 1. Refer to this scale when providing us with your pain level. 2. Be  accurate and careful when reporting your pain level. This will help with your care. 3. Over-reporting your pain level will lead to loss of credibility. 4. Even a level of 1/10 means that there is pain and will be treated at our facility. 5. High, inaccurate reporting will be documented as "Symptom Exaggeration", leading to loss of credibility and suspicions of possible secondary gains such as obtaining more narcotics, or wanting to appear disabled, for fraudulent reasons. 6. Only pain levels of 5 or below will be seen at our facility. 7. Pain levels of 6 and above will be sent to the Emergency Department and the appointment cancelled. ____________________________________________________________________________________________   ____________________________________________________________________________________________  Preparing for your procedure (without sedation)  Instructions: . Oral Intake: Do not eat or drink anything for at least 3 hours prior to your procedure. . Transportation: Unless otherwise stated by your physician, you may drive yourself after the procedure. . Blood Pressure Medicine: Take your blood pressure medicine with a sip of water the morning of the procedure. . Blood thinners:  . Diabetics on insulin: Notify the staff so that you can be scheduled 1st case in the morning. If your diabetes requires high dose insulin, take only  of your normal insulin dose the morning of the procedure and notify the staff that you have done so. . Preventing infections: Shower with an antibacterial soap the morning of your procedure.  . Build-up your immune system: Take 1000 mg of Vitamin C with every meal (3 times a day) the day prior to   your procedure. . Antibiotics: Inform the staff if you have a condition or reason that requires you to take antibiotics before dental procedures. . Pregnancy: If you are pregnant, call and cancel the procedure. . Sickness: If you have a cold, fever, or any  active infections, call and cancel the procedure. . Arrival: You must be in the facility at least 30 minutes prior to your scheduled procedure. . Children: Do not bring any children with you. . Dress appropriately: Bring dark clothing that you would not mind if they get stained. . Valuables: Do not bring any jewelry or valuables.  Procedure appointments are reserved for interventional treatments only. . No Prescription Refills. . No medication changes will be discussed during procedure appointments. . No disability issues will be discussed.  Remember:  Regular Business hours are:  Monday to Thursday 8:00 AM to 4:00 PM  Provider's Schedule: Denijah Karrer, MD:  Procedure days: Tuesday and Thursday 7:30 AM to 4:00 PM  Bilal Lateef, MD:  Procedure days: Monday and Wednesday 7:30 AM to 4:00 PM ____________________________________________________________________________________________    

## 2018-03-03 ENCOUNTER — Ambulatory Visit
Admission: RE | Admit: 2018-03-03 | Discharge: 2018-03-03 | Disposition: A | Discharge status: Home / Self Care | Payer: PPO | Source: Ambulatory Visit | Attending: Pain Medicine | Admitting: Pain Medicine

## 2018-03-03 ENCOUNTER — Other Ambulatory Visit: Payer: Self-pay | Admitting: *Deleted

## 2018-03-03 ENCOUNTER — Other Ambulatory Visit: Payer: Self-pay

## 2018-03-03 ENCOUNTER — Ambulatory Visit (HOSPITAL_BASED_OUTPATIENT_CLINIC_OR_DEPARTMENT_OTHER): Payer: PPO | Admitting: Pain Medicine

## 2018-03-03 VITALS — BP 146/78 | HR 85 | Temp 97.8°F | Resp 12 | Ht 65.0 in | Wt 180.0 lb

## 2018-03-03 DIAGNOSIS — G4700 Insomnia, unspecified: Secondary | ICD-10-CM

## 2018-03-03 DIAGNOSIS — M79605 Pain in left leg: Secondary | ICD-10-CM | POA: Insufficient documentation

## 2018-03-03 DIAGNOSIS — M5442 Lumbago with sciatica, left side: Secondary | ICD-10-CM

## 2018-03-03 DIAGNOSIS — Z885 Allergy status to narcotic agent status: Secondary | ICD-10-CM | POA: Diagnosis not present

## 2018-03-03 DIAGNOSIS — G8929 Other chronic pain: Secondary | ICD-10-CM

## 2018-03-03 DIAGNOSIS — M5136 Other intervertebral disc degeneration, lumbar region: Secondary | ICD-10-CM | POA: Insufficient documentation

## 2018-03-03 DIAGNOSIS — Z881 Allergy status to other antibiotic agents status: Secondary | ICD-10-CM | POA: Diagnosis not present

## 2018-03-03 MED ORDER — ROPIVACAINE HCL 2 MG/ML IJ SOLN
2.0000 mL | Freq: Once | INTRAMUSCULAR | Status: AC
  Administered 2018-03-03: 10 mL via EPIDURAL
  Filled 2018-03-03: qty 10

## 2018-03-03 MED ORDER — TRIAMCINOLONE ACETONIDE 40 MG/ML IJ SUSP
40.0000 mg | Freq: Once | INTRAMUSCULAR | Status: AC
  Administered 2018-03-03: 40 mg
  Filled 2018-03-03: qty 1

## 2018-03-03 MED ORDER — SODIUM CHLORIDE 0.9 % IJ SOLN
INTRAMUSCULAR | Status: AC
  Filled 2018-03-03: qty 10

## 2018-03-03 MED ORDER — IOPAMIDOL (ISOVUE-M 200) INJECTION 41%
10.0000 mL | Freq: Once | INTRAMUSCULAR | Status: AC
  Administered 2018-03-03: 10 mL via EPIDURAL
  Filled 2018-03-03: qty 10

## 2018-03-03 MED ORDER — SODIUM CHLORIDE 0.9% FLUSH
2.0000 mL | Freq: Once | INTRAVENOUS | Status: AC
  Administered 2018-03-03: 10 mL

## 2018-03-03 MED ORDER — LIDOCAINE HCL 2 % IJ SOLN
20.0000 mL | Freq: Once | INTRAMUSCULAR | Status: AC
  Administered 2018-03-03: 400 mg
  Filled 2018-03-03: qty 20

## 2018-03-03 NOTE — Progress Notes (Signed)
Safety precautions to be maintained throughout the outpatient stay will include: orient to surroundings, keep bed in low position, maintain call bell within reach at all times, provide assistance with transfer out of bed and ambulation.  

## 2018-03-03 NOTE — Patient Instructions (Signed)

## 2018-03-03 NOTE — Telephone Encounter (Signed)
Advised patient that she has refill on current prescription and to call pharmacy

## 2018-03-03 NOTE — Progress Notes (Signed)
Patient's Name: Kelsey Carter  MRN: [id number]  Referring Provider: Adline Potter, MD  DOB: Jun 14, 1946  PCP: Adline Potter, MD  DOS: 03/03/2018  Note by: Gaspar Cola, MD  Service setting: Ambulatory outpatient  Specialty: Interventional Pain Management  Patient type: Established  Location: [facility] (AMB) Pain Management Facility  Visit type: Interventional Procedure   Primary Reason for Visit: Interventional Pain Management Treatment. CC: Back Pain (left, lower)  Procedure:       Anesthesia, Analgesia, Anxiolysis:  Type: Therapeutic Inter-Laminar Epidural Steroid Injection #1  Region: Lumbar Level: L4-5 Level. Laterality: Left-Sided Paramedial  Type: Local Anesthesia Indication(s): Analgesia         Route: Infiltration ([place]/IM) IV Access: Declined Sedation: Declined  Local Anesthetic: Lidocaine 1-2%   Indications: 1. DDD (degenerative disc disease), lumbar   2. Chronic lower extremity pain (Secondary Area of Pain) (Left)   3. Chronic low back pain (Primary Area of Pain) (Bilateral) (L>R) with left-sided sciatica    Pain Score: Pre-procedure: 7 /10 Post-procedure: 0-No pain/10  Pre-op Assessment:  Kelsey Carter is a 72 y.o. (year old), female patient, seen today for interventional treatment. She  has a past surgical history that includes Tonsillectomy; Cardiac catheterization (2018); Cesarean section; Mastectomy; and Appendectomy. Kelsey Carter has a current medication list which includes the following prescription(s): alendronate, alprazolam, anastrozole, aspirin, atorvastatin, vitamin d, ibuprofen, losartan-hydrochlorothiazide, and metoprolol succinate. Her primarily concern today is the Back Pain (left, lower)  Initial Vital Signs:  Pulse Rate: 85 Temp: 97.8 F (36.6 C) Resp: 16 BP: 128/83 SpO2: 99 %  BMI: Estimated body mass index is 29.95 kg/m as calculated from the following:   Height as of this encounter: 5\' 5"  (1.651 m).   Weight as of this encounter: 180 lb  (81.6 kg).  Risk Assessment: Allergies: Reviewed. She is allergic to ciprofloxacin and hydrocodone-acetaminophen.  Allergy Precautions: None required Coagulopathies: Reviewed. None identified.  Blood-thinner therapy: None at this time Active Infection(s): Reviewed. None identified. Kelsey Carter is afebrile  Site Confirmation: Kelsey Carter was asked to confirm the procedure and laterality before marking the site Procedure checklist: Completed Consent: Before the procedure and under the influence of no sedative(s), amnesic(s), or anxiolytics, the patient was informed of the treatment options, risks and possible complications. To fulfill our ethical and legal obligations, as recommended by the American Medical Association's Code of Ethics, I have informed the patient of my clinical impression; the nature and purpose of the treatment or procedure; the risks, benefits, and possible complications of the intervention; the alternatives, including doing nothing; the risk(s) and benefit(s) of the alternative treatment(s) or procedure(s); and the risk(s) and benefit(s) of doing nothing. The patient was provided information about the general risks and possible complications associated with the procedure. These may include, but are not limited to: failure to achieve desired goals, infection, bleeding, organ or nerve damage, allergic reactions, paralysis, and death. In addition, the patient was informed of those risks and complications associated to Spine-related procedures, such as failure to decrease pain; infection (i.e.: Meningitis, epidural or intraspinal abscess); bleeding (i.e.: epidural hematoma, subarachnoid hemorrhage, or any other type of intraspinal or peri-dural bleeding); organ or nerve damage (i.e.: Any type of peripheral nerve, nerve root, or spinal cord injury) with subsequent damage to sensory, motor, and/or autonomic systems, resulting in permanent pain, numbness, and/or weakness of one or  several areas of the body; allergic reactions; (i.e.: anaphylactic reaction); and/or death. Furthermore, the patient was informed of those risks and complications associated with the medications. These  include, but are not limited to: allergic reactions (i.e.: anaphylactic or anaphylactoid reaction(s)); adrenal axis suppression; blood sugar elevation that in diabetics may result in ketoacidosis or comma; water retention that in patients with history of congestive heart failure may result in shortness of breath, pulmonary edema, and decompensation with resultant heart failure; weight gain; swelling or edema; medication-induced neural toxicity; particulate matter embolism and blood vessel occlusion with resultant organ, and/or nervous system infarction; and/or aseptic necrosis of one or more joints. Finally, the patient was informed that Medicine is not an exact science; therefore, there is also the possibility of unforeseen or unpredictable risks and/or possible complications that may result in a catastrophic outcome. The patient indicated having understood very clearly. We have given the patient no guarantees and we have made no promises. Enough time was given to the patient to ask questions, all of which were answered to the patient's satisfaction. Kelsey Carter has indicated that she wanted to continue with the procedure. Attestation: I, the ordering provider, attest that I have discussed with the patient the benefits, risks, side-effects, alternatives, likelihood of achieving goals, and potential problems during recovery for the procedure that I have provided informed consent. Date  Time: 03/03/2018  9:18 AM  Pre-Procedure Preparation:  Monitoring: As per clinic protocol. Respiration, ETCO2, SpO2, BP, heart rate and rhythm monitor placed and checked for adequate function Safety Precautions: Patient was assessed for positional comfort and pressure points before starting the procedure. Time-out: I  initiated and conducted the "Time-out" before starting the procedure, as per protocol. The patient was asked to participate by confirming the accuracy of the "Time Out" information. Verification of the correct person, site, and procedure were performed and confirmed by me, the nursing staff, and the patient. "Time-out" conducted as per Joint Commission's Universal Protocol (UP.01.01.01). Time: 1130  Description of Procedure:       Position: Prone with head of the table was raised to facilitate breathing. Target Area: The interlaminar space, initially targeting the lower laminar border of the superior vertebral body. Approach: Paramedial approach. Area Prepped: Entire Posterior Lumbar Region Prepping solution: ChloraPrep (2% chlorhexidine gluconate and 70% isopropyl alcohol) Safety Precautions: Aspiration looking for blood return was conducted prior to all injections. At no point did we inject any substances, as a needle was being advanced. No attempts were made at seeking any paresthesias. Safe injection practices and needle disposal techniques used. Medications properly checked for expiration dates. SDV (single dose vial) medications used. Description of the Procedure: Protocol guidelines were followed. The procedure needle was introduced through the skin, ipsilateral to the reported pain, and advanced to the target area. Bone was contacted and the needle walked caudad, until the lamina was cleared. The epidural space was identified using "loss-of-resistance technique" with 2-3 ml of PF-NaCl (0.9% NSS), in a 5cc LOR glass syringe. Vitals:   03/03/18 1125 03/03/18 1130 03/03/18 1136 03/03/18 1138  BP: (!) 166/140 (!) 166/104 (!) 167/100 (!) 146/78  Pulse:      Resp: 16 14 13 12   Temp:      TempSrc:      SpO2: 100% 100% 99% 100%  Weight:      Height:        Start Time: 1130 hrs. End Time: 1137 hrs. Materials:  Needle(s) Type: Epidural needle Gauge: 17G Length: 3.5-in Medication(s): Please  see orders for medications and dosing details.  Imaging Guidance (Spinal):  Type of Imaging Technique: Fluoroscopy Guidance (Spinal) Indication(s): Assistance in needle guidance and placement for procedures requiring needle  placement in or near specific anatomical locations not easily accessible without such assistance. Exposure Time: Please see nurses notes. Contrast: Before injecting any contrast, we confirmed that the patient did not have an allergy to iodine, shellfish, or radiological contrast. Once satisfactory needle placement was completed at the desired level, radiological contrast was injected. Contrast injected under live fluoroscopy. No contrast complications. See chart for type and volume of contrast used. Fluoroscopic Guidance: I was personally present during the use of fluoroscopy. "Tunnel Vision Technique" used to obtain the best possible view of the target area. Parallax error corrected before commencing the procedure. "Direction-depth-direction" technique used to introduce the needle under continuous pulsed fluoroscopy. Once target was reached, antero-posterior, oblique, and lateral fluoroscopic projection used confirm needle placement in all planes. Images permanently stored in EMR. Interpretation: I personally interpreted the imaging intraoperatively. Adequate needle placement confirmed in multiple planes. Appropriate spread of contrast into desired area was observed. No evidence of afferent or efferent intravascular uptake. No intrathecal or subarachnoid spread observed. Permanent images saved into the patient's record.  Antibiotic Prophylaxis:   Anti-infectives (From admission, onward)   None     Indication(s): None identified  Post-operative Assessment:  Post-procedure Vital Signs:  Pulse Rate: 85 Temp: 97.8 F (36.6 C) Resp: 12 BP: (!) 146/78 SpO2: 100 %  EBL: None  Complications: No immediate post-treatment complications observed by team, or reported by  patient.  Note: The patient tolerated the entire procedure well. A repeat set of vitals were taken after the procedure and the patient was kept under observation following institutional policy, for this type of procedure. Post-procedural neurological assessment was performed, showing return to baseline, prior to discharge. The patient was provided with post-procedure discharge instructions, including a section on how to identify potential problems. Should any problems arise concerning this procedure, the patient was given instructions to immediately contact us, at any time, without hesitation. In any case, we plan to contact the patient by telephone for a follow-up status report regarding this interventional procedure.  Comments:  No additional relevant information.  Plan of Care    Imaging Orders     DG C-Arm 1-60 Min-No Report  Procedure Orders     Lumbar Epidural Injection  Medications ordered for procedure: Meds ordered this encounter  Medications  . iopamidol (ISOVUE-M) 41 % intrathecal injection 10 mL    Must be Myelogram-compatible. If not available, you may substitute with a water-soluble, non-ionic, hypoallergenic, myelogram-compatible radiological contrast medium.  Marland Kitchen lidocaine (XYLOCAINE) 2 % (with pres) injection 400 mg  . sodium chloride flush (NS) 0.9 % injection 2 mL  . ropivacaine (PF) 2 mg/mL (0.2%) (NAROPIN) injection 2 mL  . triamcinolone acetonide (KENALOG-40) injection 40 mg   Medications administered: We administered iopamidol, lidocaine, sodium chloride flush, ropivacaine (PF) 2 mg/mL (0.2%), and triamcinolone acetonide.  See the medical record for exact dosing, route, and time of administration.  New Prescriptions   No medications on file   Disposition: Discharge home  Discharge Date & Time: 03/03/2018; 1143 hrs.   Physician-requested Follow-up: Return for post-procedure eval (2 wks), w/ Dr. Dossie Arbour.  Future Appointments  Date Time Provider [place]  03/22/2018  9:00 AM Adline Potter, MD [facility] None  03/28/2018 10:15 AM Milinda Pointer, MD [facility] None  09/13/2018  2:45 PM Grayland Ormond Kathlene November, MD [facility] None   Primary Care Physician: Adline Potter, MD Location: [facility] Out[facility] Management Facility Note by: Gaspar Cola, MD Date: 03/03/2018; Time: 11:45 AM  Disclaimer:  Medicine is not  an Chief Strategy Officer. The only guarantee in medicine is that nothing is guaranteed. It is important to note that the decision to proceed with this intervention was based on the information collected from the patient. The Data and conclusions were drawn from the patient's questionnaire, the interview, and the physical examination. Because the information was provided in large part by the patient, it cannot be guaranteed that it has not been purposely or unconsciously manipulated. Every effort has been made to obtain as much relevant data as possible for this evaluation. It is important to note that the conclusions that lead to this procedure are derived in large part from the available data. Always take into account that the treatment will also be dependent on availability of resources and existing treatment guidelines, considered by other Pain Management Practitioners as being common knowledge and practice, at the time of the intervention. For Medico-Legal purposes, it is also important to point out that variation in procedural techniques and pharmacological choices are the acceptable norm. The indications, contraindications, technique, and results of the above procedure should only be interpreted and judged by a Board-Certified Interventional Pain Specialist with extensive familiarity and expertise in the same exact procedure and technique.

## 2018-03-04 ENCOUNTER — Telehealth: Payer: Self-pay

## 2018-03-04 NOTE — Telephone Encounter (Signed)
Post procedure phone call.  Patient states she is doing ok.  

## 2018-03-08 ENCOUNTER — Encounter: Payer: Self-pay | Admitting: Emergency Medicine

## 2018-03-08 ENCOUNTER — Ambulatory Visit
Admission: EM | Admit: 2018-03-08 | Discharge: 2018-03-08 | Disposition: A | Discharge status: Home / Self Care | Payer: PPO | Attending: Family Medicine | Admitting: Family Medicine

## 2018-03-08 ENCOUNTER — Telehealth: Payer: Self-pay | Admitting: *Deleted

## 2018-03-08 ENCOUNTER — Other Ambulatory Visit: Payer: Self-pay

## 2018-03-08 DIAGNOSIS — M5432 Sciatica, left side: Secondary | ICD-10-CM | POA: Diagnosis not present

## 2018-03-08 DIAGNOSIS — M5136 Other intervertebral disc degeneration, lumbar region: Secondary | ICD-10-CM

## 2018-03-08 MED ORDER — PREDNISONE 10 MG (21) PO TBPK: ORAL_TABLET | Freq: Every day | ORAL | 0 refills | Status: DC

## 2018-03-08 MED ORDER — GABAPENTIN 100 MG PO CAPS: 100.0000 mg | ORAL_CAPSULE | Freq: Three times a day (TID) | ORAL | 0 refills | Status: DC

## 2018-03-08 NOTE — ED Provider Notes (Signed)
[facility]    CSN: [id number] Arrival date & time: 03/08/18  1432     History   Chief Complaint Chief Complaint  Patient presents with  . Leg Pain    left    HPI Kelsey Carter is a 72 y.o. female.   Patient is a 72 year old female who presents with complaint of left leg pain.  Patient states she has a history of degenerative disc disease and was seen by Dr. Leatha Gilding on 3/13 and given a spinal injection of Kenalog, ropivacaine, and lidocaine.  Patient states she had improvement of her pain afterwards which had been 10/10 and came down to 3/10.  She states however since that day her pain has been continually worsening.  She states she is able to walk without her cane for about 2 days after her injection but is now having to use it again.  She states her pain has jumped from 3-4 to a 6 and now 8 out of 10 this morning.  Patient states the pain is similar to what it has been in the past.  She describes it as a shooting pain coming down from her hip down to her ankle.  Patient does report some numbness and tingling which is more than her usual.      Past Medical History:  Diagnosis Date  . Breast cancer ([place])   . Hyperlipidemia   . Hypertension     Patient Active Problem List   Diagnosis Date Noted  . DDD (degenerative disc disease), lumbar 03/02/2018  . Chronic low back pain (Primary Area of Pain) (Bilateral) (L>R) with left-sided sciatica 02/15/2018  . Chronic lower extremity pain (Secondary Area of Pain) (Left) 02/15/2018  . Chronic pain syndrome 02/15/2018  . Long term prescription benzodiazepine use 02/15/2018  . Pharmacologic therapy 02/15/2018  . Disorder of skeletal system 02/15/2018  . Problems influencing health status 02/15/2018  . Chronic sacroiliac joint pain 02/15/2018  . Benign essential HTN 10/05/2017  . Coronary artery disease involving native coronary artery of native heart 10/05/2017  . Hyperlipidemia, mixed 10/05/2017  . Elevated TSH  09/21/2017  . Hypertension 08/24/2017  . Cardiomyopathy due to chemotherapy ([place]) 08/24/2017  . Insomnia 08/24/2017  . Peripheral neuropathy due to chemotherapy ([place]) 08/24/2017  . Bilateral breast cancer ([place]) 08/24/2017  . Onychomycosis 08/24/2017  . Overweight (BMI 25.0-29.9) 08/24/2017  . Vitamin D deficiency 08/24/2017  . Hyperlipidemia 08/24/2017    Past Surgical History:  Procedure Laterality Date  . APPENDECTOMY    . CARDIAC CATHETERIZATION  2018  . CESAREAN SECTION    . MASTECTOMY     5 diff procedures   . TONSILLECTOMY      OB History    No data available       Home Medications    Prior to Admission medications   Medication Sig Start Date End Date Taking? Authorizing Provider  alendronate (FOSAMAX) 70 MG tablet Take 1 tablet (70 mg total) by mouth once a week. Take with a full glass of water on an empty stomach. 02/28/18  Yes Lloyd Huger, MD  ALPRAZolam Duanne Moron) 1 MG tablet Take 1 tablet (1 mg total) by mouth at bedtime as needed for anxiety. 02/03/18  Yes Lloyd Huger, MD  anastrozole (ARIMIDEX) 1 MG tablet Take 1 tablet (1 mg total) by mouth daily. 08/30/17  Yes Lloyd Huger, MD  aspirin (BAYER LOW DOSE) 81 MG EC tablet Take 81 mg by mouth daily. Swallow whole.   Yes [provider]  atorvastatin (LIPITOR) 40 MG tablet Take 1 tablet (40 mg total) by mouth at bedtime. 09/21/17  Yes Plonk, Gwyndolyn Saxon, MD  Cholecalciferol (VITAMIN D) 2000 units CAPS Take 1 capsule (2,000 Units total) by mouth daily. 08/25/17  Yes Plonk, Gwyndolyn Saxon, MD  ibuprofen (ADVIL,MOTRIN) 800 MG tablet Take 800 mg by mouth every 8 (eight) hours as needed.   Yes [provider]  losartan-hydrochlorothiazide (HYZAAR) 100-12.5 MG tablet Take 1 tablet by mouth daily. 09/21/17  Yes Plonk, Gwyndolyn Saxon, MD  metoprolol succinate (TOPROL-XL) 50 MG 24 hr tablet Take 1 tablet (50 mg total) by mouth daily. Patient taking differently: Take 50 mg by mouth daily.  09/21/17  Yes Plonk,  Gwyndolyn Saxon, MD  gabapentin (NEURONTIN) 100 MG capsule Take 1 capsule (100 mg total) by mouth 3 (three) times daily. 03/08/18   Luvenia Redden, PA-C  predniSONE (STERAPRED UNI-PAK 21 TAB) 10 MG (21) TBPK tablet Take by mouth daily. Take 6 tabs by mouth daily  for 2 days, then 5 tabs for 2 days, then 4 tabs for 2 days, then 3 tabs for 2 days, 2 tabs for 2 days, then 1 tab by mouth daily for 2 days 03/08/18   Luvenia Redden, PA-C    Family History Family History  Problem Relation Age of Onset  . Hypertension Mother   . Cervical cancer Mother   . Cancer Father   . Breast cancer Paternal Aunt   . Tuberculosis Maternal Grandfather   . Breast cancer Paternal Grandmother     Social History Social History   Tobacco Use  . Smoking status: Never Smoker  . Smokeless tobacco: Never Used  Substance Use Topics  . Alcohol use: No  . Drug use: No     Allergies   Ciprofloxacin and Hydrocodone-acetaminophen   Review of Systems Review of Systems  As noted above in HPI.  Other systems reviewed and found to be negative   Physical Exam Triage Vital Signs ED Triage Vitals [03/08/18 1446]  Enc Vitals Group     BP (!) 160/88     Pulse Rate 83     Resp 16     Temp 98.2 F (36.8 C)     Temp Source Oral     SpO2 99 %     Weight 180 lb (81.6 kg)     Height 5\' 5"  (1.651 m)     Head Circumference      Peak Flow      Pain Score 8     Pain Loc      Pain Edu?      Excl. in [place]?    No data found.  Updated Vital Signs BP (!) 160/88 (BP Location: Left Arm)   Pulse 83   Temp 98.2 F (36.8 C) (Oral)   Resp 16   Ht 5\' 5"  (1.651 m)   Wt 180 lb (81.6 kg)   SpO2 99%   BMI 29.95 kg/m    Physical Exam  Constitutional: She is oriented to person, place, and time. She appears well-developed and well-nourished.  Eyes: EOM are normal. Pupils are equal, round, and reactive to light.  Neck: Neck supple.  Pulmonary/Chest: No respiratory distress.  Musculoskeletal: Normal range of motion. She  exhibits no tenderness.       Right shoulder: She exhibits normal range of motion and no bony tenderness.  No lower extremity edema, no calf tenderness with active dorsiflexion of the foot.  Neurological: She is alert and oriented to person, place, and time. No  cranial nerve deficit.     UC Treatments / Results  Labs (all labs ordered are listed, but only abnormal results are displayed) Labs Reviewed - No data to display  EKG  EKG Interpretation None       Radiology No results found.  Procedures Procedures (including critical care time)  Medications Ordered in UC Medications - No data to display   Initial Impression / Assessment and Plan / UC Course  I have reviewed the triage vital signs and the nursing notes.  Pertinent labs & imaging results that were available during my care of the patient were reviewed by me and considered in my medical decision making (see chart for details).     Patient with symptoms in line with her previous history of sciatica.  She had some minimal improvement after her injection on the 13th but her pain has been worsening since then.  Patient without shortness of breath, chest pain, lower extremity edema or calf pain with foot flexion.  Patient describes his pain as what she has had similar but with increasing intensity last few days.  We will go ahead and treat her with a short course of oral steroids as well as some gabapentin.  We will have her follow-up with Dr. Dossie Arbour or Dr. Vicente Masson when she can be seen in their clinics.  Final Clinical Impressions(s) / UC Diagnoses   Final diagnoses:  DDD (degenerative disc disease), lumbar  Sciatica of left side    ED Discharge Orders        Ordered    predniSONE (STERAPRED UNI-PAK 21 TAB) 10 MG (21) TBPK tablet  Daily     03/08/18 1527    gabapentin (NEURONTIN) 100 MG capsule  3 times daily     03/08/18 1527       Controlled Substance Prescriptions [place] Controlled Substance Registry consulted?  Not Applicable   Luvenia Redden, PA-C 03/08/18 1529

## 2018-03-08 NOTE — Discharge Instructions (Signed)
-  Gabapentin: 1 tablet 3 times a day -Steroid Dosepak: Take as directed -Other instructions as per Dr. Dossie Arbour -Follow with Dr. Dossie Arbour or Dr. Vicente Masson as able

## 2018-03-08 NOTE — ED Triage Notes (Signed)
Patient in today c/o left leg pain. Patient has DDD and was seen by Dr. Maris Berger [facility]) for injection Wednesday (03/02/18). Patient states she started having pain 3 days after injection and has gotten worse.

## 2018-03-08 NOTE — Telephone Encounter (Signed)
Went to Urgent Care, was given steroids and Gabapentin. Patient advised again to expect increase pain until 7-10 days following procedure.

## 2018-03-11 ENCOUNTER — Telehealth: Payer: Self-pay | Admitting: Family Medicine

## 2018-03-11 NOTE — Telephone Encounter (Signed)
Roselyn Reef, will you please follow up with Ms. Krus. She is wanting to know if she could be referred to a pain clinic in [place]. Dr. Lowella Dandy cannot see her until the 8th and she went to urgent care to get relief.   She has stopped taking her gabapentin (NEURONTIN) 100 MG capsule [[id number]  she said she will discuss with Dr. Vicente Masson during her visit w/ him on 4/2 Was experience all of the negative side effects of that Rx (not stable while walking, not sleeping well) Thank you! Jill Alexanders

## 2018-03-14 ENCOUNTER — Telehealth: Payer: Self-pay

## 2018-03-14 ENCOUNTER — Telehealth: Payer: Self-pay | Admitting: Pain Medicine

## 2018-03-14 NOTE — Telephone Encounter (Signed)
No prior auth reqd

## 2018-03-14 NOTE — Telephone Encounter (Signed)
Patient does have a standing Order for LESI, does she need PA or can we just schedule?

## 2018-03-14 NOTE — Telephone Encounter (Signed)
Done

## 2018-03-14 NOTE — Telephone Encounter (Signed)
Patient called stating she is not happy with [facility] and wants new appt and asked how long will it take. Asked Lexine Baton and she advised me that it may take 1-2 months and they do not like when you swap pain clinics. I have not told patient wanted your advise but the bellow note came to me today also from nurse healthy advisor:  Roselyn Reef, will you please follow up with Ms. Hartsock. She is wanting to know if she could be referred to a pain clinic in [place]. Dr. Lowella Dandy cannot see her until the 8th and she went to urgent care to get relief.   She has stopped taking her gabapentin (NEURONTIN) 100 MG capsule [[id number]  she said she will discuss with Dr. Vicente Masson during her visit w/ him on 4/2 Was experience all of the negative side effects of that Rx (not stable while walking, not sleeping well) Thank you! Jill Alexanders       Documentation

## 2018-03-14 NOTE — Telephone Encounter (Signed)
Patient having a lot of pain down her leg, cant wait until next appt. Needs to see Dr. Dossie Arbour this week. Please call asap

## 2018-03-15 DIAGNOSIS — E782 Mixed hyperlipidemia: Secondary | ICD-10-CM | POA: Diagnosis not present

## 2018-03-15 DIAGNOSIS — I10 Essential (primary) hypertension: Secondary | ICD-10-CM | POA: Diagnosis not present

## 2018-03-15 DIAGNOSIS — I2510 Atherosclerotic heart disease of native coronary artery without angina pectoris: Secondary | ICD-10-CM | POA: Diagnosis not present

## 2018-03-16 ENCOUNTER — Ambulatory Visit: Payer: PPO

## 2018-03-16 NOTE — Telephone Encounter (Signed)
Called patient to schedule LESI appt. She went to see cardiologist due to BP spiking and he is referring her to ortho first. She will call back to let us know what her plans are today

## 2018-03-22 ENCOUNTER — Encounter: Payer: Self-pay | Admitting: Family Medicine

## 2018-03-22 ENCOUNTER — Other Ambulatory Visit
Admission: RE | Admit: 2018-03-22 | Discharge: 2018-03-22 | Disposition: A | Discharge status: Home / Self Care | Payer: PPO | Source: Ambulatory Visit | Attending: Family Medicine | Admitting: Family Medicine

## 2018-03-22 ENCOUNTER — Ambulatory Visit (INDEPENDENT_AMBULATORY_CARE_PROVIDER_SITE_OTHER): Payer: PPO | Admitting: Family Medicine

## 2018-03-22 VITALS — BP 120/81 | HR 78 | Resp 16 | Ht 65.0 in | Wt 174.0 lb

## 2018-03-22 DIAGNOSIS — M5442 Lumbago with sciatica, left side: Secondary | ICD-10-CM

## 2018-03-22 DIAGNOSIS — T451X5A Adverse effect of antineoplastic and immunosuppressive drugs, initial encounter: Secondary | ICD-10-CM

## 2018-03-22 DIAGNOSIS — F419 Anxiety disorder, unspecified: Secondary | ICD-10-CM

## 2018-03-22 DIAGNOSIS — I427 Cardiomyopathy due to drug and external agent: Secondary | ICD-10-CM

## 2018-03-22 DIAGNOSIS — E559 Vitamin D deficiency, unspecified: Secondary | ICD-10-CM | POA: Insufficient documentation

## 2018-03-22 DIAGNOSIS — G8929 Other chronic pain: Secondary | ICD-10-CM

## 2018-03-22 DIAGNOSIS — I10 Essential (primary) hypertension: Secondary | ICD-10-CM

## 2018-03-22 DIAGNOSIS — R739 Hyperglycemia, unspecified: Secondary | ICD-10-CM | POA: Diagnosis not present

## 2018-03-22 DIAGNOSIS — X58XXXA Exposure to other specified factors, initial encounter: Secondary | ICD-10-CM | POA: Diagnosis not present

## 2018-03-22 DIAGNOSIS — R7989 Other specified abnormal findings of blood chemistry: Secondary | ICD-10-CM

## 2018-03-22 LAB — COMPREHENSIVE METABOLIC PANEL
ALBUMIN: 4.4 g/dL (ref 3.5–5.0)
ALT: 31 U/L (ref 14–54)
ANION GAP: 6 (ref 5–15)
AST: 25 U/L (ref 15–41)
Alkaline Phosphatase: 79 U/L (ref 38–126)
BUN: 25 mg/dL — ABNORMAL HIGH (ref 6–20)
CO2: 27 mmol/L (ref 22–32)
Calcium: 10 mg/dL (ref 8.9–10.3)
Chloride: 102 mmol/L (ref 101–111)
Creatinine, Ser: 0.98 mg/dL (ref 0.44–1.00)
GFR calc non Af Amer: 56 mL/min — ABNORMAL LOW (ref >60)
GLUCOSE: 120 mg/dL — AB (ref 65–99)
POTASSIUM: 4.2 mmol/L (ref 3.5–5.1)
SODIUM: 135 mmol/L (ref 135–145)
TOTAL PROTEIN: 7.7 g/dL (ref 6.5–8.1)
Total Bilirubin: 0.9 mg/dL (ref 0.3–1.2)

## 2018-03-22 LAB — CBC
HCT: 38.1 % (ref 35.0–47.0)
Hemoglobin: 13.1 g/dL (ref 12.0–16.0)
MCH: 31.5 pg (ref 26.0–34.0)
MCHC: 34.4 g/dL (ref 32.0–36.0)
MCV: 91.4 fL (ref 80.0–100.0)
Platelets: 229 10*3/uL (ref 150–440)
RBC: 4.17 MIL/uL (ref 3.80–5.20)
RDW: 14.2 % (ref 11.5–14.5)
WBC: 5 10*3/uL (ref 3.6–11.0)

## 2018-03-22 LAB — HEMOGLOBIN A1C
Hgb A1c MFr Bld: 5.9 % — ABNORMAL HIGH (ref 4.8–5.6)
MEAN PLASMA GLUCOSE: 122.63 mg/dL

## 2018-03-22 LAB — MAGNESIUM: MAGNESIUM: 1.9 mg/dL (ref 1.7–2.4)

## 2018-03-22 LAB — TSH: TSH: 3.997 u[IU]/mL (ref 0.350–4.500)

## 2018-03-22 MED ORDER — TELMISARTAN 80 MG PO TABS: 80.0000 mg | ORAL_TABLET | Freq: Every day | ORAL | 3 refills | Status: DC

## 2018-03-22 MED ORDER — PREDNISONE 10 MG PO TABS: 10.0000 mg | ORAL_TABLET | Freq: Two times a day (BID) | ORAL | 0 refills | Status: DC

## 2018-03-22 MED ORDER — HYDROCHLOROTHIAZIDE 12.5 MG PO TABS: 12.5000 mg | ORAL_TABLET | Freq: Every day | ORAL | 3 refills | Status: DC

## 2018-03-23 ENCOUNTER — Encounter: Payer: Self-pay | Admitting: Family Medicine

## 2018-03-23 DIAGNOSIS — R7303 Prediabetes: Secondary | ICD-10-CM | POA: Insufficient documentation

## 2018-03-23 DIAGNOSIS — F419 Anxiety disorder, unspecified: Secondary | ICD-10-CM | POA: Insufficient documentation

## 2018-03-23 LAB — VITAMIN D 25 HYDROXY (VIT D DEFICIENCY, FRACTURES): Vit D, 25-Hydroxy: 59 ng/mL (ref 30.0–100.0)

## 2018-03-23 NOTE — Progress Notes (Signed)
Date:  03/22/2018   Name:  Kelsey Carter   DOB:  1946/11/14   MRN:  [id number]  PCP:  Adline Potter, MD    Chief Complaint: Back Pain (Was given injections that did not help and she could not get back in to them as [place] was on vacation. ) and Hypertension (Saw [facility] for HTN being so bad at pain clinic and she switched metoprolol 25 to 50mg  160/107 was avg BP. Also D/C Losartan and added Telmisartan 80mg  and HCTZ 12.5. Needs refills on these as the cardiologist wants PCP to take over this medication plan. )   History of Present Illness:  This is a 72 y.o. female seen for six month f/u. Seen last month at [facility] for LBP L>R, XR showed DDD, steroids helped x 1 week only, for ortho eval next week, wants prednisone refill. Cards increased metoprolol dose last week for elevated BP. Gabapentin caused blurry vision and falling so stopped. On Arimidex/Fosamax per oncology and Xanax per cards, takes Mg, Ca, vit D supplements.  Review of Systems:  Review of Systems  Constitutional: Negative for chills and fever.  Respiratory: Negative for cough and shortness of breath.   Cardiovascular: Negative for chest pain and leg swelling.  Genitourinary: Negative for difficulty urinating.  Neurological: Negative for syncope and light-headedness.    Patient Active Problem List   Diagnosis Date Noted  . DDD (degenerative disc disease), lumbar 03/02/2018  . Chronic low back pain (Primary Area of Pain) (Bilateral) (L>R) with left-sided sciatica 02/15/2018  . Chronic lower extremity pain (Secondary Area of Pain) (Left) 02/15/2018  . Chronic pain syndrome 02/15/2018  . Long term prescription benzodiazepine use 02/15/2018  . Pharmacologic therapy 02/15/2018  . Disorder of skeletal system 02/15/2018  . Problems influencing health status 02/15/2018  . Chronic sacroiliac joint pain 02/15/2018  . Benign essential HTN 10/05/2017  . Coronary artery disease involving native coronary artery of  native heart 10/05/2017  . Hyperlipidemia, mixed 10/05/2017  . Elevated TSH 09/21/2017  . Cardiomyopathy due to chemotherapy ([place]) 08/24/2017  . Insomnia 08/24/2017  . Peripheral neuropathy due to chemotherapy ([place]) 08/24/2017  . Bilateral breast cancer ([place]) 08/24/2017  . Onychomycosis 08/24/2017  . Overweight (BMI 25.0-29.9) 08/24/2017  . Vitamin D deficiency 08/24/2017  . Hyperlipidemia 08/24/2017    Prior to Admission medications   Medication Sig Start Date End Date Taking? Authorizing Provider  acetaminophen (TYLENOL) 500 MG tablet Take 1,000 mg by mouth every 8 (eight) hours as needed.   Yes [provider]  alendronate (FOSAMAX) 70 MG tablet Take 1 tablet by mouth once a week.   Yes [provider]  ALPRAZolam Duanne Moron) 1 MG tablet Take 1 tablet (1 mg total) by mouth at bedtime as needed for anxiety. 02/03/18  Yes Lloyd Huger, MD  anastrozole (ARIMIDEX) 1 MG tablet Take 1 tablet (1 mg total) by mouth daily. 08/30/17  Yes Lloyd Huger, MD  aspirin (BAYER LOW DOSE) 81 MG EC tablet Take 81 mg by mouth daily. Swallow whole.   Yes [provider]  atorvastatin (LIPITOR) 40 MG tablet Take 1 tablet (40 mg total) by mouth at bedtime. 09/21/17  Yes Aarit Kashuba, Gwyndolyn Saxon, MD  calcium gluconate 500 MG tablet Take 1 tablet by mouth 3 (three) times daily.   Yes [provider]  Cholecalciferol (VITAMIN D) 2000 units CAPS Take 1 capsule (2,000 Units total) by mouth daily. 08/25/17  Yes Mckenzy Salazar, Gwyndolyn Saxon, MD  MAGNESIUM PO Take by mouth.   Yes  [provider]  metoprolol succinate (TOPROL-XL) 50 MG 24 hr tablet Take by mouth. 03/15/18  Yes [provider]  telmisartan (MICARDIS) 80 MG tablet Take 1 tablet (80 mg total) by mouth daily. 03/22/18  Yes Ison Wichmann, Gwyndolyn Saxon, MD  hydrochlorothiazide (HYDRODIURIL) 12.5 MG tablet Take 1 tablet (12.5 mg total) by mouth daily. 03/22/18   Richard Ritchey, Gwyndolyn Saxon, MD  predniSONE (DELTASONE) 10 MG tablet Take 1 tablet (10 mg  total) by mouth 2 (two) times daily with a meal. 03/22/18   Adline Potter, MD    Allergies  Allergen Reactions  . Ciprofloxacin Other (See Comments)    Joint pain   . Gabapentin Other (See Comments)    Eyes blurry; vision decreased  . Hydrocodone-Acetaminophen Nausea And Vomiting    Past Surgical History:  Procedure Laterality Date  . APPENDECTOMY    . CARDIAC CATHETERIZATION  2018  . CESAREAN SECTION    . MASTECTOMY     5 diff procedures   . TONSILLECTOMY      Social History   Tobacco Use  . Smoking status: Never Smoker  . Smokeless tobacco: Never Used  Substance Use Topics  . Alcohol use: No  . Drug use: No    Family History  Problem Relation Age of Onset  . Hypertension Mother   . Cervical cancer Mother   . Cancer Father   . Breast cancer Paternal Aunt   . Tuberculosis Maternal Grandfather   . Breast cancer Paternal Grandmother     Medication list has been reviewed and updated.  Physical Examination: BP 120/81   Pulse 78   Resp 16   Ht 5\' 5"  (1.651 m)   Wt 174 lb (78.9 kg)   SpO2 98%   BMI 28.96 kg/m   Physical Exam  Constitutional: She appears well-developed and well-nourished.  Cardiovascular: Normal rate, regular rhythm and normal heart sounds.  Pulmonary/Chest: Effort normal and breath sounds normal.  Musculoskeletal: She exhibits no edema.  Neurological: She is alert.  Skin: Skin is warm and dry.  Psychiatric: She has a normal mood and affect. Her behavior is normal.  Nursing note and vitals reviewed.   Assessment and Plan:  1. Cardiomyopathy due to chemotherapy (HCC) Stable on Micardis/metoprolol/HCTZ/asa - Comprehensive Metabolic Panel (CMET) - CBC - Magnesium  2. Chronic bilateral low back pain with left-sided sciatica Prednisone 10 mg bid x 5d, for ortho eval next week  3. Essential hypertension Well controlled on current regimen  4. Anxiety On Xanax per cards  5. Vitamin D deficiency On supplement - Vitamin D (25  hydroxy)  6. Elevated TSH - TSH  7. Hyperglycemia - HgB A1c  8. Med review Consider d/c Ca, Mg supplements  Return in about 6 months (around 09/21/2018).  Satira Anis. [facility]  03/23/2018

## 2018-03-28 ENCOUNTER — Ambulatory Visit: Payer: PPO | Admitting: Pain Medicine

## 2018-03-30 DIAGNOSIS — M5126 Other intervertebral disc displacement, lumbar region: Secondary | ICD-10-CM | POA: Diagnosis not present

## 2018-03-30 DIAGNOSIS — M5416 Radiculopathy, lumbar region: Secondary | ICD-10-CM | POA: Diagnosis not present

## 2018-03-30 DIAGNOSIS — M5136 Other intervertebral disc degeneration, lumbar region: Secondary | ICD-10-CM | POA: Diagnosis not present

## 2018-03-31 ENCOUNTER — Ambulatory Visit (INDEPENDENT_AMBULATORY_CARE_PROVIDER_SITE_OTHER): Payer: PPO

## 2018-03-31 VITALS — BP 122/72 | HR 84 | Temp 98.4°F | Resp 12 | Ht 65.0 in | Wt 174.2 lb

## 2018-03-31 DIAGNOSIS — Z0000 Encounter for general adult medical examination without abnormal findings: Secondary | ICD-10-CM

## 2018-03-31 NOTE — Progress Notes (Signed)
Subjective:   Kelsey Carter is a 72 y.o. female who presents for an Initial Medicare Annual Wellness Visit.  Review of Systems    N/A  Cardiac Risk Factors include: advanced age (>46men, >33 women);dyslipidemia;hypertension;sedentary lifestyle     Objective:    Today's Vitals   03/31/18 1333  BP: 122/72  Pulse: 84  Resp: 12  Temp: 98.4 F (36.9 C)  TempSrc: Oral  SpO2: 95%  Weight: 174 lb 3.2 oz (79 kg)  Height: 5\' 5"  (1.651 m)   Body mass index is 28.99 kg/m.  Advanced Directives 03/31/2018 02/28/2018 08/30/2017 08/24/2017  Does Patient Have a Medical Advance Directive? Yes Yes Yes Yes  Type of Paramedic of [place];Living will Living will;Healthcare Power of Attorney Living will;Healthcare Power of Attorney -  Copy of [place] in Chart? No - copy requested No - copy requested - -    Current Medications (verified) Outpatient Encounter Medications as of 03/31/2018  Medication Sig  . alendronate (FOSAMAX) 70 MG tablet Take 1 tablet by mouth once a week.  . ALPRAZolam (XANAX) 1 MG tablet Take 1 tablet (1 mg total) by mouth at bedtime as needed for anxiety.  Marland Kitchen anastrozole (ARIMIDEX) 1 MG tablet Take 1 tablet (1 mg total) by mouth daily.  Marland Kitchen aspirin (BAYER LOW DOSE) 81 MG EC tablet Take 81 mg by mouth daily. Swallow whole.  Marland Kitchen atorvastatin (LIPITOR) 40 MG tablet Take 1 tablet (40 mg total) by mouth at bedtime.  . calcium gluconate 500 MG tablet Take 1 tablet by mouth 3 (three) times daily.  . Cholecalciferol (VITAMIN D) 2000 units CAPS Take 1 capsule (2,000 Units total) by mouth daily.  . hydrochlorothiazide (HYDRODIURIL) 12.5 MG tablet Take 1 tablet (12.5 mg total) by mouth daily.  Marland Kitchen MAGNESIUM PO Take 1 tablet by mouth daily.   . metoprolol succinate (TOPROL-XL) 50 MG 24 hr tablet Take 50 mg by mouth daily.   . predniSONE (DELTASONE) 10 MG tablet Take 1 tablet (10 mg total) by mouth 2 (two) times daily with a meal.  .  telmisartan (MICARDIS) 80 MG tablet Take 1 tablet (80 mg total) by mouth daily.  . [DISCONTINUED] acetaminophen (TYLENOL) 500 MG tablet Take 1,000 mg by mouth every 8 (eight) hours as needed.   No facility-administered encounter medications on file as of 03/31/2018.     Allergies (verified) Ciprofloxacin; Gabapentin; and Hydrocodone-acetaminophen   History: Past Medical History:  Diagnosis Date  . Breast cancer ([place])   . Hyperlipidemia   . Hypertension    Past Surgical History:  Procedure Laterality Date  . APPENDECTOMY    . CARDIAC CATHETERIZATION  2018  . CESAREAN SECTION    . MASTECTOMY     5 diff procedures   . TONSILLECTOMY     Family History  Problem Relation Age of Onset  . Hypertension Mother   . Cervical cancer Mother   . Renal Disease Mother   . Cancer Father   . Breast cancer Paternal Aunt   . Tuberculosis Maternal Grandfather   . Breast cancer Paternal Grandmother    Social History   Socioeconomic History  . Marital status: Married    Spouse name: Eddie Dibbles  . Number of children: 4  . Years of education: 1 year of college  . Highest education level: 12th grade  Occupational History  . Occupation: Retired  Scientific laboratory technician  . Financial resource strain: Not hard at all  . Food insecurity:    Worry: Never  true    Inability: Never true  . Transportation needs:    Medical: No    Non-medical: No  Tobacco Use  . Smoking status: Never Smoker  . Smokeless tobacco: Never Used  . Tobacco comment: smoking cessation materials not required  Substance and Sexual Activity  . Alcohol use: No  . Drug use: No  . Sexual activity: Not Currently  Lifestyle  . Physical activity:    Days per week: 0 days    Minutes per session: 0 min  . Stress: Not at all  Relationships  . Social connections:    Talks on phone: Patient refused    Gets together: Patient refused    Attends religious service: Patient refused    Active member of club or organization: Patient refused     Attends meetings of clubs or organizations: Patient refused    Relationship status: Married  Other Topics Concern  . Not on file  Social History Narrative  . Not on file    Tobacco Counseling Counseling given: No Comment: smoking cessation materials not required  Clinical Intake:  Pre-visit preparation completed: Yes  Pain : No/denies pain   BMI - recorded: 28.99 Nutritional Status: BMI 25 -29 Overweight Nutritional Risks: None Diabetes: No  How often do you need to have someone help you when you read instructions, pamphlets, or other written materials from your doctor or pharmacy?: 1 - Never  Interpreter Needed?: No  Information entered by :: -   Activities of Daily Living In your present state of health, do you have any difficulty performing the following activities: 03/31/2018 08/24/2017  Hearing? N N  Comment denies hearing aids -  Vision? N N  Comment wears eyeglasses -  Difficulty concentrating or making decisions? N N  Walking or climbing stairs? Y N  Comment L leg pain -  Dressing or bathing? N N  Doing errands, shopping? N N  Preparing Food and eating ? N -  Comment denies dentures -  Using the Toilet? N -  In the past six months, have you accidently leaked urine? N -  Do you have problems with loss of bowel control? N -  Managing your Medications? N -  Managing your Finances? N -  Housekeeping or managing your Housekeeping? N -  Some recent data might be hidden     Immunizations and Health Maintenance Immunization History  Administered Date(s) Administered  . Pneumococcal Conjugate-13 12/22/2015  . Tdap 01/21/2018   There are no preventive care reminders to display for this patient.  Patient Care Team: Juline Patch, MD as PCP - General (Family Medicine) Corey Skains, MD as Consulting Physician (Cardiology) Lloyd Huger, MD as Consulting Physician (Oncology) Duanne Guess, PA-C as Physician Assistant (Orthopedic  Surgery)  Indicate any recent Medical Services you may have received from other than [facility] providers in the past year (date may be approximate).     Assessment:   This is a routine wellness examination for [facility]  Hearing/Vision screen Vision Screening Comments: Sees Dr. George Ina for annual eye exams  Dietary issues and exercise activities discussed: Current Exercise Habits: The patient does not participate in regular exercise at present, Exercise limited by: orthopedic condition(s)(peripheral neuropathy, L leg pain, back pain)  Goals    . DIET - INCREASE WATER INTAKE     Recommend to drink at least 6-8 8oz glasses of water per day.      Depression Screen PHQ 2/9 Scores 03/31/2018 03/03/2018 03/02/2018 02/15/2018 08/24/2017  PHQ -  2 Score 0 0 0 0 0  PHQ- 9 Score 0 - - - -    Fall Risk Fall Risk  03/31/2018 03/03/2018 03/02/2018 02/15/2018 08/24/2017  Falls in the past year? Yes No No Yes No  Comment was taking gabapentin at the time of her fall - - - -  Number falls in past yr: 2 or more - - 2 or more -  Injury with Fall? Yes - - - -  Comment abrasions - - - -  Risk Factor Category  High Fall Risk - - High Fall Risk -  Risk for fall due to : Impaired vision;Medication side effect - - Impaired balance/gait;History of fall(s) -  Risk for fall due to: Comment wears eyeglasses, was taking gabapentin at the time of her fall - - Neurophay in feet -  Follow up Falls evaluation completed;Education provided;Falls prevention discussed - - - -    Is the home free of loose throw rugs in walkways, pet beds, electrical cords, etc? Yes Adequate lighting to reduce risk of falls?  Yes In addition, does the patient have any of the following: Stairs in or around the home WITH handrails? No Grab bars in the bathroom? Yes  Shower chair or a place to sit while bathing? Yes Use of an elevated toilet seat or a handicapped toilet? Yes Use of a cane, walker or w/c? Yes, walker prn  Timed Get Up and Go  Performed: Yes. Pt ambulated 10 feet within 12 sec. Gait slow, steady and without the use of an assistive device. No intervention required at this time. Fall risk prevention has been discussed.  Community Resource Referral not required at this time.  Cognitive Function:     6CIT Screen 03/31/2018  What Year? 0 points  What month? 0 points  What time? 0 points  Count back from 20 0 points  Months in reverse 0 points  Repeat phrase 2 points  Total Score 2    Screening Tests Health Maintenance  Topic Date Due  . PNA vac Low Risk Adult (2 of 2 - PPSV23) 04/01/2019 (Originally 12/21/2016)  . DEXA SCAN  10/07/2019  . COLONOSCOPY  04/01/2023  . TETANUS/TDAP  01/22/2028  . Hepatitis C Screening  Completed  . MAMMOGRAM  Discontinued    Qualifies for Shingles Vaccine? Yes. Due for Zostavax or Shingrix vaccine. Education has been provided regarding the importance of this vaccine. Pt has been advised to call her insurance company to determine her out of pocket expense. Advised she may also receive this vaccine at her local pharmacy or Health Dept. Verbalized acceptance and understanding.  Due for Pneumoccocal vaccine. Declined my offer to administer today. Education has been provided regarding the importance of this vaccine but still declined. Pt has been advised to call our office is she should change her mind and wish to receive this vaccine. Also advised she may receive this vaccine at her local pharmacy or Health Dept. Pt is aware to provide a copy of her vaccination record if she chooses to receive this vaccine at her local pharmacy. Verbalized acceptance and understanding.  Cancer Screenings: Lung: Low Dose CT Chest recommended if Age 45-80 years, 30 pack-year currently smoking OR have quit w/in 15years. Patient does not qualify. Breast Screening: No longer required d/t B mastectomy Up to date of Bone Density/Dexa? Yes. Completed 10/06/17. Repeat every 2 years Colorectal: Pt states she had  a colonoscopy completed in 03/31/13 while living in [place]. Unable to locate report to determine frequency.  Pt states she was informed a colonoscopy would not longer be required.   Additional Screenings: Hepatitis C Screening: Completed 09/20/13   Plan:  I have personally reviewed and addressed the Medicare Annual Wellness questionnaire and have noted the following in the patient's chart:  A. Medical and social history B. Use of alcohol, tobacco or illicit drugs  C. Current medications and supplements D. Functional ability and status E.  Nutritional status F.  Physical activity G. Advance directives H. List of other physicians I.  Hospitalizations, surgeries, and ER visits in previous 12 months J.  [place] such as hearing and vision if needed, cognitive and depression L. Referrals and appointments  In addition, I have reviewed and discussed with patient certain preventive protocols, quality metrics, and best practice recommendations. A written personalized care plan for preventive services as well as general preventive health recommendations were provided to patient.  Signed,  Aleatha Borer, LPN Nurse Health Advisor  MD Recommendations: Due for Zostavax or Shingrix vaccine. Education has been provided regarding the importance of this vaccine. Pt has been advised to call her insurance company to determine her out of pocket expense. Advised she may also receive this vaccine at her local pharmacy or Health Dept. Verbalized acceptance and understanding.  Due for Pneumoccocal vaccine. Declined my offer to administer today. Education has been provided regarding the importance of this vaccine but still declined. Pt has been advised to call our office is she should change her mind and wish to receive this vaccine. Also advised she may receive this vaccine at her local pharmacy or Health Dept. Pt is aware to provide a copy of her vaccination record if she chooses to receive this vaccine at  her local pharmacy. Verbalized acceptance and understanding.

## 2018-03-31 NOTE — Patient Instructions (Signed)
Kelsey Carter , Thank you for taking time to come for your Medicare Wellness Visit. I appreciate your ongoing commitment to your health goals. Please review the following plan we discussed and let me know if I can assist you in the future.   Screening recommendations/referrals: Colorectal Screening: No longer required Mammogram: No longer required Bone Density: No longer required  Vision and Dental Exams: Recommended annual ophthalmology exams for early detection of glaucoma and other disorders of the eye Recommended annual dental exams for proper oral hygiene  Vaccinations: Influenza vaccine: Contraindicated Pneumococcal vaccine: Declined Tdap vaccine: Declined. Please call your insurance company to determine your out of pocket expense. You may also receive this vaccine at your local pharmacy or Health Dept. Shingles vaccine: Please call your insurance company to determine your out of pocket expense for the Shingrix vaccine. You may also receive this vaccine at your local pharmacy or Health Dept.  Advanced directives: Please bring a copy of your POA (Power of Attorney) and/or Living Will to your next appointment.  Conditions/risks identified: Recommend to drink at least 6-8 8oz glasses of water per day.  Next appointment: Please schedule your Annual Wellness Visit with your Nurse Health Advisor in one year.  Preventive Care 73 Years and Older, Female Preventive care refers to lifestyle choices and visits with your health care provider that can promote health and wellness. What does preventive care include?  A yearly physical exam. This is also called an annual well check.  Dental exams once or twice a year.  Routine eye exams. Ask your health care provider how often you should have your eyes checked.  Personal lifestyle choices, including:  Daily care of your teeth and gums.  Regular physical activity.  Eating a healthy diet.  Avoiding tobacco and drug use.  Limiting  alcohol use.  Practicing safe sex.  Taking low-dose aspirin every day.  Taking vitamin and mineral supplements as recommended by your health care provider. What happens during an annual well check? The services and screenings done by your health care provider during your annual well check will depend on your age, overall health, lifestyle risk factors, and family history of disease. Counseling  Your health care provider may ask you questions about your:  Alcohol use.  Tobacco use.  Drug use.  Emotional well-being.  Home and relationship well-being.  Sexual activity.  Eating habits.  History of falls.  Memory and ability to understand (cognition).  Work and work Statistician.  Reproductive health. Screening  You may have the following tests or measurements:  Height, weight, and BMI.  Blood pressure.  Lipid and cholesterol levels. These may be checked every 5 years, or more frequently if you are over 70 years old.  Skin check.  Lung cancer screening. You may have this screening every year starting at age 56 if you have a 30-pack-year history of smoking and currently smoke or have quit within the past 15 years.  Fecal occult blood test (FOBT) of the stool. You may have this test every year starting at age 73.  Flexible sigmoidoscopy or colonoscopy. You may have a sigmoidoscopy every 5 years or a colonoscopy every 10 years starting at age 73.  Hepatitis C blood test.  Hepatitis B blood test.  Sexually transmitted disease (STD) testing.  Diabetes screening. This is done by checking your blood sugar (glucose) after you have not eaten for a while (fasting). You may have this done every 1-3 years.  Bone density scan. This is done to screen for  osteoporosis. You may have this done starting at age 73.  Mammogram. This may be done every 1-2 years. Talk to your health care provider about how often you should have regular mammograms. Talk with your health care provider  about your test results, treatment options, and if necessary, the need for more tests. Vaccines  Your health care provider may recommend certain vaccines, such as:  Influenza vaccine. This is recommended every year.  Tetanus, diphtheria, and acellular pertussis (Tdap, Td) vaccine. You may need a Td booster every 10 years.  Zoster vaccine. You may need this after age 67.  Pneumococcal 13-valent conjugate (PCV13) vaccine. One dose is recommended after age 73.  Pneumococcal polysaccharide (PPSV23) vaccine. One dose is recommended after age 73. Talk to your health care provider about which screenings and vaccines you need and how often you need them. This information is not intended to replace advice given to you by your health care provider. Make sure you discuss any questions you have with your health care provider. Document Released: 01/03/2016 Document Revised: 08/26/2016 Document Reviewed: 10/08/2015 Elsevier Interactive Patient Education  2017 [place] Prevention in the Home Falls can cause injuries. They can happen to people of all ages. There are many things you can do to make your home safe and to help prevent falls. What can I do on the outside of my home?  Regularly fix the edges of walkways and driveways and fix any cracks.  Remove anything that might make you trip as you walk through a door, such as a raised step or threshold.  Trim any bushes or trees on the path to your home.  Use bright outdoor lighting.  Clear any walking paths of anything that might make someone trip, such as rocks or tools.  Regularly check to see if handrails are loose or broken. Make sure that both sides of any steps have handrails.  Any raised decks and porches should have guardrails on the edges.  Have any leaves, snow, or ice cleared regularly.  Use sand or salt on walking paths during winter.  Clean up any spills in your garage right away. This includes oil or grease  spills. What can I do in the bathroom?  Use night lights.  Install grab bars by the toilet and in the tub and shower. Do not use towel bars as grab bars.  Use non-skid mats or decals in the tub or shower.  If you need to sit down in the shower, use a plastic, non-slip stool.  Keep the floor dry. Clean up any water that spills on the floor as soon as it happens.  Remove soap buildup in the tub or shower regularly.  Attach bath mats securely with double-sided non-slip rug tape.  Do not have throw rugs and other things on the floor that can make you trip. What can I do in the bedroom?  Use night lights.  Make sure that you have a light by your bed that is easy to reach.  Do not use any sheets or blankets that are too big for your bed. They should not hang down onto the floor.  Have a firm chair that has side arms. You can use this for support while you get dressed.  Do not have throw rugs and other things on the floor that can make you trip. What can I do in the kitchen?  Clean up any spills right away.  Avoid walking on wet floors.  Keep items that you use  a lot in easy-to-reach places.  If you need to reach something above you, use a strong step stool that has a grab bar.  Keep electrical cords out of the way.  Do not use floor polish or wax that makes floors slippery. If you must use wax, use non-skid floor wax.  Do not have throw rugs and other things on the floor that can make you trip. What can I do with my stairs?  Do not leave any items on the stairs.  Make sure that there are handrails on both sides of the stairs and use them. Fix handrails that are broken or loose. Make sure that handrails are as long as the stairways.  Check any carpeting to make sure that it is firmly attached to the stairs. Fix any carpet that is loose or worn.  Avoid having throw rugs at the top or bottom of the stairs. If you do have throw rugs, attach them to the floor with carpet  tape.  Make sure that you have a light switch at the top of the stairs and the bottom of the stairs. If you do not have them, ask someone to add them for you. What else can I do to help prevent falls?  Wear shoes that:  Do not have high heels.  Have rubber bottoms.  Are comfortable and fit you well.  Are closed at the toe. Do not wear sandals.  If you use a stepladder:  Make sure that it is fully opened. Do not climb a closed stepladder.  Make sure that both sides of the stepladder are locked into place.  Ask someone to hold it for you, if possible.  Clearly mark and make sure that you can see:  Any grab bars or handrails.  First and last steps.  Where the edge of each step is.  Use tools that help you move around (mobility aids) if they are needed. These include:  Canes.  Walkers.  Scooters.  Crutches.  Turn on the lights when you go into a dark area. Replace any light bulbs as soon as they burn out.  Set up your furniture so you have a clear path. Avoid moving your furniture around.  If any of your floors are uneven, fix them.  If there are any pets around you, be aware of where they are.  Review your medicines with your doctor. Some medicines can make you feel dizzy. This can increase your chance of falling. Ask your doctor what other things that you can do to help prevent falls. This information is not intended to replace advice given to you by your health care provider. Make sure you discuss any questions you have with your health care provider. Document Released: 10/03/2009 Document Revised: 05/14/2016 Document Reviewed: 01/11/2015 Elsevier Interactive Patient Education  2017 [employer].

## 2018-04-01 ENCOUNTER — Other Ambulatory Visit: Payer: Self-pay | Admitting: Orthopedic Surgery

## 2018-04-01 DIAGNOSIS — M5416 Radiculopathy, lumbar region: Secondary | ICD-10-CM

## 2018-04-01 DIAGNOSIS — M5136 Other intervertebral disc degeneration, lumbar region: Secondary | ICD-10-CM

## 2018-04-06 ENCOUNTER — Ambulatory Visit
Admission: RE | Admit: 2018-04-06 | Discharge: 2018-04-06 | Disposition: A | Discharge status: Home / Self Care | Payer: PPO | Source: Ambulatory Visit | Attending: Orthopedic Surgery | Admitting: Orthopedic Surgery

## 2018-04-06 DIAGNOSIS — M5127 Other intervertebral disc displacement, lumbosacral region: Secondary | ICD-10-CM | POA: Diagnosis not present

## 2018-04-06 DIAGNOSIS — M4186 Other forms of scoliosis, lumbar region: Secondary | ICD-10-CM | POA: Diagnosis not present

## 2018-04-06 DIAGNOSIS — M5136 Other intervertebral disc degeneration, lumbar region: Secondary | ICD-10-CM | POA: Diagnosis not present

## 2018-04-06 DIAGNOSIS — M5416 Radiculopathy, lumbar region: Secondary | ICD-10-CM

## 2018-04-06 DIAGNOSIS — M5126 Other intervertebral disc displacement, lumbar region: Secondary | ICD-10-CM | POA: Diagnosis not present

## 2018-04-06 DIAGNOSIS — M5117 Intervertebral disc disorders with radiculopathy, lumbosacral region: Secondary | ICD-10-CM | POA: Diagnosis not present

## 2018-04-06 DIAGNOSIS — M4807 Spinal stenosis, lumbosacral region: Secondary | ICD-10-CM | POA: Diagnosis not present

## 2018-04-18 DIAGNOSIS — M5126 Other intervertebral disc displacement, lumbar region: Secondary | ICD-10-CM | POA: Diagnosis not present

## 2018-04-18 DIAGNOSIS — M5416 Radiculopathy, lumbar region: Secondary | ICD-10-CM | POA: Diagnosis not present

## 2018-04-27 ENCOUNTER — Other Ambulatory Visit: Payer: Self-pay | Admitting: Oncology

## 2018-04-27 DIAGNOSIS — G4700 Insomnia, unspecified: Secondary | ICD-10-CM

## 2018-04-29 ENCOUNTER — Other Ambulatory Visit: Payer: Self-pay | Admitting: Oncology

## 2018-04-29 ENCOUNTER — Telehealth: Payer: Self-pay

## 2018-04-29 MED ORDER — ANASTROZOLE 1 MG PO TABS: 1.0000 mg | ORAL_TABLET | Freq: Every day | ORAL | 3 refills | Status: DC

## 2018-04-29 NOTE — Telephone Encounter (Signed)
Patient called requesting refill of Anastrazole  Prescription.  Order sent to patient's pharmacy.

## 2018-05-17 DIAGNOSIS — M5126 Other intervertebral disc displacement, lumbar region: Secondary | ICD-10-CM | POA: Diagnosis not present

## 2018-05-17 DIAGNOSIS — M5416 Radiculopathy, lumbar region: Secondary | ICD-10-CM | POA: Diagnosis not present

## 2018-05-24 ENCOUNTER — Other Ambulatory Visit: Payer: Self-pay | Admitting: *Deleted

## 2018-05-24 DIAGNOSIS — G4700 Insomnia, unspecified: Secondary | ICD-10-CM

## 2018-05-24 MED ORDER — ALPRAZOLAM 1 MG PO TABS: ORAL_TABLET | ORAL | 2 refills | Status: DC

## 2018-05-24 NOTE — Telephone Encounter (Signed)
I will refill but she will need to start getting this from PCP.

## 2018-06-20 DIAGNOSIS — M5127 Other intervertebral disc displacement, lumbosacral region: Secondary | ICD-10-CM | POA: Diagnosis not present

## 2018-06-20 DIAGNOSIS — M5416 Radiculopathy, lumbar region: Secondary | ICD-10-CM | POA: Diagnosis not present

## 2018-06-20 DIAGNOSIS — M5126 Other intervertebral disc displacement, lumbar region: Secondary | ICD-10-CM | POA: Diagnosis not present

## 2018-06-22 DIAGNOSIS — M5416 Radiculopathy, lumbar region: Secondary | ICD-10-CM | POA: Diagnosis not present

## 2018-06-27 ENCOUNTER — Other Ambulatory Visit: Payer: Self-pay

## 2018-06-27 ENCOUNTER — Encounter
Admission: RE | Admit: 2018-06-27 | Discharge: 2018-06-27 | Disposition: A | Discharge status: Home / Self Care | Payer: PPO | Source: Ambulatory Visit | Attending: Neurosurgery | Admitting: Neurosurgery

## 2018-06-27 DIAGNOSIS — I10 Essential (primary) hypertension: Secondary | ICD-10-CM | POA: Insufficient documentation

## 2018-06-27 DIAGNOSIS — M5416 Radiculopathy, lumbar region: Secondary | ICD-10-CM | POA: Diagnosis not present

## 2018-06-27 DIAGNOSIS — K219 Gastro-esophageal reflux disease without esophagitis: Secondary | ICD-10-CM

## 2018-06-27 DIAGNOSIS — F419 Anxiety disorder, unspecified: Secondary | ICD-10-CM

## 2018-06-27 DIAGNOSIS — G9529 Other cord compression: Secondary | ICD-10-CM | POA: Diagnosis not present

## 2018-06-27 DIAGNOSIS — Z881 Allergy status to other antibiotic agents status: Secondary | ICD-10-CM | POA: Diagnosis not present

## 2018-06-27 DIAGNOSIS — Z01812 Encounter for preprocedural laboratory examination: Secondary | ICD-10-CM

## 2018-06-27 DIAGNOSIS — Z79899 Other long term (current) drug therapy: Secondary | ICD-10-CM

## 2018-06-27 DIAGNOSIS — Z888 Allergy status to other drugs, medicaments and biological substances status: Secondary | ICD-10-CM | POA: Diagnosis not present

## 2018-06-27 DIAGNOSIS — D649 Anemia, unspecified: Secondary | ICD-10-CM | POA: Insufficient documentation

## 2018-06-27 DIAGNOSIS — Z885 Allergy status to narcotic agent status: Secondary | ICD-10-CM | POA: Diagnosis not present

## 2018-06-27 HISTORY — DX: Other chronic pain: G89.29

## 2018-06-27 HISTORY — DX: Adverse effect of antineoplastic and immunosuppressive drugs, initial encounter: T45.1X5A

## 2018-06-27 HISTORY — DX: Peripheral vascular disease, unspecified: I73.9

## 2018-06-27 HISTORY — DX: Nausea with vomiting, unspecified: R11.2

## 2018-06-27 HISTORY — DX: Other intervertebral disc displacement, lumbar region: M51.26

## 2018-06-27 HISTORY — DX: Adverse effect of unspecified anesthetic, initial encounter: T41.45XA

## 2018-06-27 HISTORY — DX: Atherosclerotic heart disease of native coronary artery without angina pectoris: I25.10

## 2018-06-27 HISTORY — DX: Cardiomyopathy due to drug and external agent: I42.7

## 2018-06-27 HISTORY — DX: Unspecified osteoarthritis, unspecified site: M19.90

## 2018-06-27 HISTORY — DX: Anxiety disorder, unspecified: F41.9

## 2018-06-27 HISTORY — DX: Anemia, unspecified: D64.9

## 2018-06-27 HISTORY — DX: Other specified postprocedural states: Z98.890

## 2018-06-27 HISTORY — DX: Other complications of anesthesia, initial encounter: T88.59XA

## 2018-06-27 HISTORY — DX: Gastro-esophageal reflux disease without esophagitis: K21.9

## 2018-06-27 LAB — CBC WITH DIFFERENTIAL/PLATELET
Basophils Absolute: 0 10*3/uL (ref 0–0.1)
Basophils Relative: 1 %
EOS ABS: 0.2 10*3/uL (ref 0–0.7)
Eosinophils Relative: 4 %
HCT: 34.7 % — ABNORMAL LOW (ref 35.0–47.0)
HEMOGLOBIN: 12.2 g/dL (ref 12.0–16.0)
LYMPHS ABS: 2.2 10*3/uL (ref 1.0–3.6)
Lymphocytes Relative: 35 %
MCH: 33 pg (ref 26.0–34.0)
MCHC: 35.2 g/dL (ref 32.0–36.0)
MCV: 93.8 fL (ref 80.0–100.0)
MONOS PCT: 9 %
Monocytes Absolute: 0.6 10*3/uL (ref 0.2–0.9)
NEUTROS PCT: 51 %
Neutro Abs: 3.3 10*3/uL (ref 1.4–6.5)
Platelets: 257 10*3/uL (ref 150–440)
RBC: 3.7 MIL/uL — ABNORMAL LOW (ref 3.80–5.20)
RDW: 14.4 % (ref 11.5–14.5)
WBC: 6.4 10*3/uL (ref 3.6–11.0)

## 2018-06-27 LAB — BASIC METABOLIC PANEL
Anion gap: 10 (ref 5–15)
BUN: 13 mg/dL (ref 8–23)
CHLORIDE: 104 mmol/L (ref 98–111)
CO2: 25 mmol/L (ref 22–32)
CREATININE: 0.91 mg/dL (ref 0.44–1.00)
Calcium: 9.9 mg/dL (ref 8.9–10.3)
GFR calc Af Amer: >60 mL/min (ref >60)
GFR calc non Af Amer: >60 mL/min (ref >60)
GLUCOSE: 111 mg/dL — AB (ref 70–99)
Potassium: 4.1 mmol/L (ref 3.5–5.1)
SODIUM: 139 mmol/L (ref 135–145)

## 2018-06-27 LAB — PROTIME-INR
INR: 0.98
Prothrombin Time: 12.9 seconds (ref 11.4–15.2)

## 2018-06-27 LAB — APTT: aPTT: 29 seconds (ref 24–36)

## 2018-06-27 MED ORDER — LACTATED RINGERS IV SOLN: INTRAVENOUS | Status: DC

## 2018-06-27 NOTE — Patient Instructions (Signed)
Your procedure is scheduled on: Wednesday, June 29, 2018  Report to [facility]   DO NOT STOP ON THE FIRST FLOOR TO REGISTER  To find out your arrival time please call [phone] between 1PM - 3PM on Tuesday, June 28, 2018  Remember: Instructions that are not followed completely may result in serious medical risk,  up to and including death, or upon the discretion of your surgeon and anesthesiologist your  surgery may need to be rescheduled.     _X__ 1. Do not eat food after midnight the night before your procedure.                 No gum chewing or hard candies. NOTHING IN YOUR MOUTH AFTER MIDNIGHT.                  You may drink clear liquids up to 2 hours before you are scheduled to arrive for your surgery-                   DO not drink clear liquids within 2 hours of the start of your surgery.                  Clear Liquids include:  water, apple juice without pulp, clear carbohydrate                 drink such as Clearfast of Gatorade, Black Coffee or Tea (Do not add                 anything to coffee or tea).  __X__2.  On the morning of surgery brush your teeth with toothpaste and water, you                may rinse your mouth with mouthwash if you wish.                   Do not swallow any toothpaste of mouthwash.     _X__ 3.  No Alcohol for 24 hours before or after surgery.   _X__ 4.  Do Not Smoke or use e-cigarettes For 24 Hours Prior to Your Surgery.                 Do not use any chewable tobacco products for at least 6 hours prior to                 surgery.  ____  5.  Bring all medications with you on the day of surgery if instructed.   ____  6.  Notify your doctor if there is any change in your medical condition      (cold, fever, infections).     Do not wear jewelry, make-up, hairpins, clips or nail polish. Do not wear lotions, powders, or perfumes. You may wear deodorant. Do not shave 48 hours prior to surgery.  Men may shave face and neck. Do not bring valuables to the hospital.    [facility] is not responsible for any belongings or valuables.  Contacts, dentures or bridgework may not be worn into surgery. Leave your suitcase in the car. After surgery it may be brought to your room. For patients admitted to the hospital, discharge time is determined by your treatment team.   Patients discharged the day of surgery will not be allowed to drive home.   Please read over the following fact sheets that you were given:   PREPARING FOR SURGERY  ____ Take these medicines the morning of surgery with A SIP OF WATER:    1. XANAX, IF YOU NEED IT  2. [place]  3. METOPROLOL  4. ARIMEDEX  5.  6.  ____ Fleet Enema (as directed)   __X__ Use CHG Soap as directed  __X__ Stop ASPIRIN AS OF TODAY                  _X___ Stop Anti-inflammatories AS OF TODAY              THIS INCLUDES ALEVE / ADVIL / MOTRIN / IBUPROFEN   __X__ Stop supplements until after surgery.               THIS INCLUDES CALCIUM PLUS D / VITAMIN D / MAGNESIUM  ____ Bring C-Pap to the hospital.   DO NOT FOSAMAX ON THE DAY OF SURGERY  CONTINUE TO TAKE HYDROCHLOROTHIAZIDE AND MICARTIS (TELMISARTAN) AS USUAL BUT DO NOT       TAKE ON THE MORNING OF SURGERY  IF YOU REMEMBER, PLEASE BRING YOUR POA / MEDICAL DIRECTIVES  PAPERS SO WE CAN MAKE A COPY FOR YOUR CHART  HAVE STOOL SOFTENERS ON HAND TO USE AFTER SURGERY  YOU MAY TAKE TYLENOL AT ANY TIME (NOT MORE THAN 4000MG  A DAY)

## 2018-06-29 ENCOUNTER — Encounter: Payer: Self-pay | Admitting: *Deleted

## 2018-06-29 ENCOUNTER — Ambulatory Visit: Payer: PPO

## 2018-06-29 ENCOUNTER — Encounter
Admission: RE | Disposition: A | Discharge status: Home / Self Care | Payer: Self-pay | Source: Ambulatory Visit | Attending: Neurosurgery

## 2018-06-29 ENCOUNTER — Ambulatory Visit
Admission: RE | Admit: 2018-06-29 | Discharge: 2018-06-29 | Disposition: A | Discharge status: Home / Self Care | Payer: PPO | Source: Ambulatory Visit | Attending: Neurosurgery | Admitting: Neurosurgery

## 2018-06-29 DIAGNOSIS — Z888 Allergy status to other drugs, medicaments and biological substances status: Secondary | ICD-10-CM | POA: Insufficient documentation

## 2018-06-29 DIAGNOSIS — I10 Essential (primary) hypertension: Secondary | ICD-10-CM | POA: Diagnosis not present

## 2018-06-29 DIAGNOSIS — G9529 Other cord compression: Secondary | ICD-10-CM | POA: Diagnosis not present

## 2018-06-29 DIAGNOSIS — F419 Anxiety disorder, unspecified: Secondary | ICD-10-CM | POA: Diagnosis not present

## 2018-06-29 DIAGNOSIS — K219 Gastro-esophageal reflux disease without esophagitis: Secondary | ICD-10-CM | POA: Diagnosis not present

## 2018-06-29 DIAGNOSIS — Z885 Allergy status to narcotic agent status: Secondary | ICD-10-CM | POA: Insufficient documentation

## 2018-06-29 DIAGNOSIS — Z419 Encounter for procedure for purposes other than remedying health state, unspecified: Secondary | ICD-10-CM

## 2018-06-29 DIAGNOSIS — M5416 Radiculopathy, lumbar region: Secondary | ICD-10-CM | POA: Insufficient documentation

## 2018-06-29 DIAGNOSIS — Z981 Arthrodesis status: Secondary | ICD-10-CM | POA: Diagnosis not present

## 2018-06-29 DIAGNOSIS — Z79899 Other long term (current) drug therapy: Secondary | ICD-10-CM | POA: Insufficient documentation

## 2018-06-29 DIAGNOSIS — Z881 Allergy status to other antibiotic agents status: Secondary | ICD-10-CM | POA: Insufficient documentation

## 2018-06-29 DIAGNOSIS — I2510 Atherosclerotic heart disease of native coronary artery without angina pectoris: Secondary | ICD-10-CM | POA: Diagnosis not present

## 2018-06-29 DIAGNOSIS — E782 Mixed hyperlipidemia: Secondary | ICD-10-CM | POA: Diagnosis not present

## 2018-06-29 HISTORY — PX: LUMBAR LAMINECTOMY/DECOMPRESSION MICRODISCECTOMY: SHX5026

## 2018-06-29 SURGERY — LUMBAR LAMINECTOMY/DECOMPRESSION MICRODISCECTOMY 1 LEVEL
Anesthesia: General | Site: Back | Wound class: Clean

## 2018-06-29 MED ORDER — SUGAMMADEX SODIUM 200 MG/2ML IV SOLN
INTRAVENOUS | Status: AC
  Filled 2018-06-29: qty 2

## 2018-06-29 MED ORDER — DEXMEDETOMIDINE HCL 200 MCG/2ML IV SOLN
INTRAVENOUS | Status: DC | PRN
  Administered 2018-06-29: 8 ug via INTRAVENOUS
  Administered 2018-06-29: 4 ug via INTRAVENOUS
  Administered 2018-06-29: 8 ug via INTRAVENOUS

## 2018-06-29 MED ORDER — MIDAZOLAM HCL 2 MG/2ML IJ SOLN
INTRAMUSCULAR | Status: DC | PRN
  Administered 2018-06-29: 2 mg via INTRAVENOUS

## 2018-06-29 MED ORDER — ONDANSETRON HCL 4 MG/2ML IJ SOLN: 4.0000 mg | Freq: Once | INTRAMUSCULAR | Status: DC | PRN

## 2018-06-29 MED ORDER — DIPHENHYDRAMINE HCL 50 MG/ML IJ SOLN
INTRAMUSCULAR | Status: AC
  Filled 2018-06-29: qty 1

## 2018-06-29 MED ORDER — MIDAZOLAM HCL 2 MG/2ML IJ SOLN
INTRAMUSCULAR | Status: AC
  Filled 2018-06-29: qty 2

## 2018-06-29 MED ORDER — DIPHENHYDRAMINE HCL 50 MG/ML IJ SOLN
INTRAMUSCULAR | Status: DC | PRN
  Administered 2018-06-29: 12.5 mg via INTRAVENOUS

## 2018-06-29 MED ORDER — EPHEDRINE SULFATE 50 MG/ML IJ SOLN
INTRAMUSCULAR | Status: AC
  Filled 2018-06-29: qty 1

## 2018-06-29 MED ORDER — CEFAZOLIN SODIUM-DEXTROSE 2-4 GM/100ML-% IV SOLN
2.0000 g | INTRAVENOUS | Status: AC
  Administered 2018-06-29: 2 g via INTRAVENOUS

## 2018-06-29 MED ORDER — SODIUM CHLORIDE 0.9 % IV SOLN
INTRAVENOUS | Status: DC | PRN
  Administered 2018-06-29: 40 mL

## 2018-06-29 MED ORDER — FENTANYL CITRATE (PF) 100 MCG/2ML IJ SOLN: 25.0000 ug | INTRAMUSCULAR | Status: DC | PRN

## 2018-06-29 MED ORDER — BUPIVACAINE HCL 0.5 % IJ SOLN
INTRAMUSCULAR | Status: DC | PRN
  Administered 2018-06-29: 20 mL

## 2018-06-29 MED ORDER — PHENYLEPHRINE HCL 10 MG/ML IJ SOLN
INTRAMUSCULAR | Status: DC | PRN
  Administered 2018-06-29: 200 ug via INTRAVENOUS
  Administered 2018-06-29: 100 ug via INTRAVENOUS

## 2018-06-29 MED ORDER — BUPIVACAINE LIPOSOME 1.3 % IJ SUSP
INTRAMUSCULAR | Status: AC
  Filled 2018-06-29: qty 20

## 2018-06-29 MED ORDER — EPHEDRINE SULFATE 50 MG/ML IJ SOLN
INTRAMUSCULAR | Status: DC | PRN
  Administered 2018-06-29 (×2): 5 mg via INTRAVENOUS

## 2018-06-29 MED ORDER — DEXAMETHASONE SODIUM PHOSPHATE 10 MG/ML IJ SOLN
INTRAMUSCULAR | Status: AC
  Filled 2018-06-29: qty 1

## 2018-06-29 MED ORDER — METHYLPREDNISOLONE 4 MG PO TBPK: ORAL_TABLET | ORAL | 0 refills | Status: DC

## 2018-06-29 MED ORDER — KETAMINE HCL 50 MG/ML IJ SOLN
INTRAMUSCULAR | Status: DC | PRN
  Administered 2018-06-29: 25 mg via INTRAVENOUS

## 2018-06-29 MED ORDER — SODIUM CHLORIDE 0.9 % IV SOLN
INTRAVENOUS | Status: DC | PRN
  Administered 2018-06-29: 20 ug/min via INTRAVENOUS

## 2018-06-29 MED ORDER — METHOCARBAMOL 500 MG PO TABS: 500.0000 mg | ORAL_TABLET | Freq: Four times a day (QID) | ORAL | 0 refills | Status: DC | PRN

## 2018-06-29 MED ORDER — FENTANYL CITRATE (PF) 100 MCG/2ML IJ SOLN
INTRAMUSCULAR | Status: DC | PRN
  Administered 2018-06-29: 100 ug via INTRAVENOUS

## 2018-06-29 MED ORDER — FENTANYL CITRATE (PF) 100 MCG/2ML IJ SOLN
INTRAMUSCULAR | Status: AC
  Filled 2018-06-29: qty 2

## 2018-06-29 MED ORDER — LIDOCAINE HCL (CARDIAC) PF 100 MG/5ML IV SOSY
PREFILLED_SYRINGE | INTRAVENOUS | Status: DC | PRN
  Administered 2018-06-29: 80 mg via INTRAVENOUS

## 2018-06-29 MED ORDER — SODIUM CHLORIDE FLUSH 0.9 % IV SOLN
INTRAVENOUS | Status: AC
  Filled 2018-06-29: qty 20

## 2018-06-29 MED ORDER — ROCURONIUM BROMIDE 50 MG/5ML IV SOLN
INTRAVENOUS | Status: AC
  Filled 2018-06-29: qty 1

## 2018-06-29 MED ORDER — ROCURONIUM BROMIDE 100 MG/10ML IV SOLN
INTRAVENOUS | Status: DC | PRN
  Administered 2018-06-29: 20 mg via INTRAVENOUS
  Administered 2018-06-29: 30 mg via INTRAVENOUS

## 2018-06-29 MED ORDER — PROPOFOL 10 MG/ML IV BOLUS
INTRAVENOUS | Status: DC | PRN
  Administered 2018-06-29: 100 mg via INTRAVENOUS

## 2018-06-29 MED ORDER — METHYLPREDNISOLONE ACETATE 40 MG/ML IJ SUSP
INTRAMUSCULAR | Status: AC
  Filled 2018-06-29: qty 1

## 2018-06-29 MED ORDER — ONDANSETRON HCL 4 MG/2ML IJ SOLN
INTRAMUSCULAR | Status: DC | PRN
  Administered 2018-06-29: 4 mg via INTRAVENOUS

## 2018-06-29 MED ORDER — LACTATED RINGERS IV SOLN
INTRAVENOUS | Status: DC
  Administered 2018-06-29: 10:00:00 via INTRAVENOUS

## 2018-06-29 MED ORDER — CEFAZOLIN SODIUM-DEXTROSE 2-4 GM/100ML-% IV SOLN
INTRAVENOUS | Status: AC
  Filled 2018-06-29: qty 100

## 2018-06-29 MED ORDER — SCOPOLAMINE 1 MG/3DAYS TD PT72
1.0000 | MEDICATED_PATCH | TRANSDERMAL | Status: DC
  Administered 2018-06-29: 1.5 mg via TRANSDERMAL

## 2018-06-29 MED ORDER — LIDOCAINE HCL (PF) 2 % IJ SOLN
INTRAMUSCULAR | Status: AC
  Filled 2018-06-29: qty 10

## 2018-06-29 MED ORDER — BUPIVACAINE HCL (PF) 0.5 % IJ SOLN
INTRAMUSCULAR | Status: AC
  Filled 2018-06-29: qty 30

## 2018-06-29 MED ORDER — BUPIVACAINE-EPINEPHRINE (PF) 0.5% -1:200000 IJ SOLN
INTRAMUSCULAR | Status: DC | PRN
  Administered 2018-06-29: 10 mL

## 2018-06-29 MED ORDER — DEXAMETHASONE SODIUM PHOSPHATE 10 MG/ML IJ SOLN
INTRAMUSCULAR | Status: DC | PRN
  Administered 2018-06-29: 10 mg via INTRAVENOUS

## 2018-06-29 MED ORDER — BACITRACIN 50000 UNITS IM SOLR
INTRAMUSCULAR | Status: AC
  Filled 2018-06-29: qty 1

## 2018-06-29 MED ORDER — ONDANSETRON HCL 4 MG/2ML IJ SOLN
INTRAMUSCULAR | Status: AC
  Filled 2018-06-29: qty 2

## 2018-06-29 MED ORDER — TRAMADOL HCL 50 MG PO TABS: 50.0000 mg | ORAL_TABLET | Freq: Four times a day (QID) | ORAL | 0 refills | Status: AC | PRN

## 2018-06-29 MED ORDER — SUGAMMADEX SODIUM 200 MG/2ML IV SOLN
INTRAVENOUS | Status: DC | PRN
  Administered 2018-06-29: 200 mg via INTRAVENOUS

## 2018-06-29 MED ORDER — SCOPOLAMINE 1 MG/3DAYS TD PT72
MEDICATED_PATCH | TRANSDERMAL | Status: AC
  Administered 2018-06-29: 1.5 mg via TRANSDERMAL
  Filled 2018-06-29: qty 1

## 2018-06-29 MED ORDER — PHENYLEPHRINE HCL 10 MG/ML IJ SOLN
INTRAMUSCULAR | Status: AC
  Filled 2018-06-29: qty 1

## 2018-06-29 MED ORDER — METHYLPREDNISOLONE ACETATE 40 MG/ML IJ SUSP
INTRAMUSCULAR | Status: DC | PRN
  Administered 2018-06-29: 40 mg

## 2018-06-29 MED ORDER — SODIUM CHLORIDE 0.9 % IR SOLN
Status: DC | PRN
  Administered 2018-06-29: 1000 mL

## 2018-06-29 MED ORDER — BUPIVACAINE-EPINEPHRINE (PF) 0.5% -1:200000 IJ SOLN
INTRAMUSCULAR | Status: AC
  Filled 2018-06-29: qty 30

## 2018-06-29 SURGICAL SUPPLY — 57 items
BUR NEURO DRILL SOFT 3.0X3.8M (BURR) ×3 IMPLANT
CANISTER SUCT 1200ML W/VALVE (MISCELLANEOUS) ×6 IMPLANT
CHLORAPREP W/TINT 26ML (MISCELLANEOUS) ×6 IMPLANT
CNTNR SPEC 2.5X3XGRAD LEK (MISCELLANEOUS) ×1
CONT SPEC 4OZ STER OR WHT (MISCELLANEOUS) ×2
CONTAINER SPEC 2.5X3XGRAD LEK (MISCELLANEOUS) ×1 IMPLANT
COUNTER NEEDLE 20/40 LG (NEEDLE) ×3 IMPLANT
COVER LIGHT HANDLE STERIS (MISCELLANEOUS) ×6 IMPLANT
CUP MEDICINE 2OZ PLAST GRAD ST (MISCELLANEOUS) ×6 IMPLANT
DERMABOND ADVANCED (GAUZE/BANDAGES/DRESSINGS) ×2
DERMABOND ADVANCED .7 DNX12 (GAUZE/BANDAGES/DRESSINGS) ×1 IMPLANT
DRAPE C-ARM 42X72 X-RAY (DRAPES) ×6 IMPLANT
DRAPE LAPAROTOMY 100X77 ABD (DRAPES) ×3 IMPLANT
DRAPE MICROSCOPE SPINE 48X150 (DRAPES) ×3 IMPLANT
DRAPE POUCH INSTRU U-SHP 10X18 (DRAPES) ×3 IMPLANT
DRAPE SURG 17X11 SM STRL (DRAPES) ×12 IMPLANT
ELECT CAUTERY BLADE TIP 2.5 (TIP) ×3
ELECT EZSTD 165MM 6.5IN (MISCELLANEOUS)
ELECT REM PT RETURN 9FT ADLT (ELECTROSURGICAL) ×3
ELECTRODE CAUTERY BLDE TIP 2.5 (TIP) ×1 IMPLANT
ELECTRODE EZSTD 165MM 6.5IN (MISCELLANEOUS) IMPLANT
ELECTRODE REM PT RTRN 9FT ADLT (ELECTROSURGICAL) ×1 IMPLANT
FRAME EYE SHIELD (PROTECTIVE WEAR) ×6 IMPLANT
GLOVE BIO SURGEON STRL SZ 6.5 (GLOVE) ×4 IMPLANT
GLOVE BIO SURGEONS STRL SZ 6.5 (GLOVE) ×2
GLOVE BIOGEL PI IND STRL 7.0 (GLOVE) ×1 IMPLANT
GLOVE BIOGEL PI INDICATOR 7.0 (GLOVE) ×2
GLOVE SURG SYN 8.5  E (GLOVE) ×6
GLOVE SURG SYN 8.5 E (GLOVE) ×3 IMPLANT
GOWN SRG XL LVL 3 NONREINFORCE (GOWNS) ×1 IMPLANT
GOWN STRL NON-REIN TWL XL LVL3 (GOWNS) ×2
GOWN STRL REUS W/TWL MED LVL3 (GOWN DISPOSABLE) ×3 IMPLANT
GRADUATE 1200CC STRL 31836 (MISCELLANEOUS) ×3 IMPLANT
KIT SPINAL PRONEVIEW (KITS) ×3 IMPLANT
KNIFE BAYONET SHORT DISCETOMY (MISCELLANEOUS) IMPLANT
MARKER SKIN DUAL TIP RULER LAB (MISCELLANEOUS) ×3 IMPLANT
NDL SAFETY ECLIPSE 18X1.5 (NEEDLE) ×1 IMPLANT
NEEDLE HYPO 18GX1.5 SHARP (NEEDLE) ×2
NEEDLE HYPO 22GX1.5 SAFETY (NEEDLE) ×3 IMPLANT
NS IRRIG 1000ML POUR BTL (IV SOLUTION) ×3 IMPLANT
PACK LAMINECTOMY NEURO (CUSTOM PROCEDURE TRAY) ×3 IMPLANT
PAD ARMBOARD 7.5X6 YLW CONV (MISCELLANEOUS) ×3 IMPLANT
SPOGE SURGIFLO 8M (HEMOSTASIS) ×2
SPONGE SURGIFLO 8M (HEMOSTASIS) ×1 IMPLANT
SUT DVC VLOC 3-0 CL 6 P-12 (SUTURE) ×3 IMPLANT
SUT VIC AB 0 CT1 27 (SUTURE) ×2
SUT VIC AB 0 CT1 27XCR 8 STRN (SUTURE) ×1 IMPLANT
SUT VIC AB 2-0 CT1 18 (SUTURE) ×3 IMPLANT
SYR 10ML LL (SYRINGE) ×3 IMPLANT
SYR 20CC LL (SYRINGE) ×3 IMPLANT
SYR 30ML LL (SYRINGE) ×6 IMPLANT
SYR 3ML LL SCALE MARK (SYRINGE) ×3 IMPLANT
TOWEL OR 17X26 4PK STRL BLUE (TOWEL DISPOSABLE) ×9 IMPLANT
TUBE MATRX SPINL 18MM 6CM DISP (INSTRUMENTS) ×2
TUBE METRX SPINAL 18X6 DISP (INSTRUMENTS) ×1 IMPLANT
TUBING CONNECTING 10 (TUBING) ×2 IMPLANT
TUBING CONNECTING 10' (TUBING) ×1

## 2018-06-29 NOTE — Transfer of Care (Signed)
Immediate Anesthesia Transfer of Care Note  Patient: Kelsey Carter  Procedure(s) Performed: LUMBAR LAMINECTOMY/DECOMPRESSION MICRODISCECTOMY 1 LEVEL-L4-5, L5-S1 LATERAL DISCECTOMY (N/A Back)  Patient Location: PACU  Anesthesia Type:General  Level of Consciousness: sedated  Airway & Oxygen Therapy: Patient Spontanous Breathing and Patient connected to face mask oxygen  Post-op Assessment: Report given to RN and Post -op Vital signs reviewed and stable  Post vital signs: Reviewed  Last Vitals:  Vitals Value Taken Time  BP 92/63 06/29/2018  2:57 PM  Temp    Pulse 71 06/29/2018  2:56 PM  Resp 12 06/29/2018  2:57 PM  SpO2 99 % 06/29/2018  2:57 PM  Vitals shown include unvalidated device data.  Last Pain:  Vitals:   06/29/18 0959  TempSrc: Temporal  PainSc: 7          Complications: No apparent anesthesia complications

## 2018-06-29 NOTE — Anesthesia Preprocedure Evaluation (Signed)
Anesthesia Evaluation  Patient identified by MRN, date of birth, ID band Patient awake    Reviewed: Allergy & Precautions, NPO status , Patient's Chart, lab work & pertinent test results  History of Anesthesia Complications (+) PONV and history of anesthetic complications  Airway Mallampati: III       Dental   Pulmonary neg sleep apnea, neg pneumonia , neg COPD,           Cardiovascular hypertension, Pt. on medications (-) Past MI and (-) CHF (-) dysrhythmias (-) Valvular Problems/Murmurs     Neuro/Psych neg Seizures Anxiety    GI/Hepatic Neg liver ROS, GERD  Medicated and Controlled,  Endo/Other  neg diabetes  Renal/GU negative Renal ROS     Musculoskeletal   Abdominal   Peds  Hematology  (+) anemia ,   Anesthesia Other Findings   Reproductive/Obstetrics                             Anesthesia Physical Anesthesia Plan  ASA: III  Anesthesia Plan: General   Post-op Pain Management:    Induction:   PONV Risk Score and Plan: 4 or greater and Ondansetron, Dexamethasone, Midazolam and Scopolamine patch - Pre-op  Airway Management Planned: Oral ETT  Additional Equipment:   Intra-op Plan:   Post-operative Plan:   Informed Consent: I have reviewed the patients History and Physical, chart, labs and discussed the procedure including the risks, benefits and alternatives for the proposed anesthesia with the patient or authorized representative who has indicated his/her understanding and acceptance.     Plan Discussed with:   Anesthesia Plan Comments:         Anesthesia Quick Evaluation

## 2018-06-29 NOTE — Discharge Instructions (Addendum)
Procedure: L4-5 lumbar decompression including central laminectomy and bilateral medial facetectomies including foraminotomies;  Left L5-S1 far lateral discectomy    Date: 06/29/2018  Your surgeon has performed an operation on your lumbar spine (low back) to relieve pressure on one or more nerves. Many times, patients feel better immediately after surgery and can overdo it. Even if you feel well, it is important that you follow these activity guidelines. If you do not let your back heal properly from the surgery, you can increase the chance of a disc herniation and/or return of your symptoms. The following are instructions to help in your recovery once you have been discharged from the hospital.  * Do not take anti-inflammatory medications for 3 days after surgery (naproxen [Aleve], ibuprofen [Advil, Motrin], celecoxib [Celebrex], etc.) * You may resume aspirin after 7 days.   Activity    No bending, lifting, or twisting (BLT). Avoid lifting objects heavier than 10 pounds (gallon milk jug).  Where possible, avoid household activities that involve lifting, bending, pushing, or pulling such as laundry, vacuuming, grocery shopping, and childcare. Try to arrange for help from friends and family for these activities while your back heals.  Increase physical activity slowly as tolerated.  Taking short walks is encouraged, but avoid strenuous exercise. Do not jog, run, bicycle, lift weights, or participate in any other exercises unless specifically allowed by your doctor. Avoid prolonged sitting, including car rides.  Talk to your doctor before resuming sexual activity.  You should not drive until cleared by your doctor.  Until released by your doctor, you should not return to work or school.  You should rest at home and let your body heal.   You may shower two days after your surgery.  After showering, lightly dab your incision dry. Do not take a tub bath or go swimming for 3 weeks, or until  approved by your doctor at your follow-up appointment.  If you smoke, we strongly recommend that you quit.  Smoking has been proven to interfere with normal healing in your back and will dramatically reduce the success rate of your surgery. Please contact [facility] (800-QUIT-NOW) and use the resources at www.[facility].com for assistance in stopping smoking.  Surgical Incision   If you have a dressing on your incision, you may remove it three days after your surgery. Keep your incision area clean and dry.  If you have staples or stitches on your incision, you should have a follow up scheduled for removal. If you do not have staples or stitches, you will have steri-strips (small pieces of surgical tape) or Dermabond glue. The steri-strips/glue should begin to peel away within about a week (it is fine if the steri-strips fall off before then). If the strips are still in place one week after your surgery, you may gently remove them.  Diet            You may return to your usual diet. Be sure to stay hydrated.  When to Contact us  Although your surgery and recovery will likely be uneventful, you may have some residual numbness, aches, and pains in your back and/or legs. This is normal and should improve in the next few weeks.  However, should you experience any of the following, contact us immediately:  New numbness or weakness  Pain that is progressively getting worse, and is not relieved by your pain medications or rest  Bleeding, redness, swelling, pain, or drainage from surgical incision  Chills or flu-like symptoms  Fever greater than  101.0 F (38.3 C)  Problems with bowel or bladder functions  Difficulty breathing or shortness of breath  Warmth, tenderness, or swelling in your calf  Contact Information  During office hours (Monday-Friday 9 am to 5 pm), please call your physician at [phone]  After hours and weekends, please call the [place] Operator at [phone] and ask  for the Neurosurgery Resident On Call   For a life-threatening emergency, call 911

## 2018-06-29 NOTE — Anesthesia Procedure Notes (Addendum)
Procedure Name: Intubation Date/Time: 06/29/2018 12:06 PM Performed by: Rolla Plate, CRNA Pre-anesthesia Checklist: Patient identified, Patient being monitored, Timeout performed, Emergency Drugs available and Suction available Patient Re-evaluated:Patient Re-evaluated prior to induction Oxygen Delivery Method: Circle system utilized Preoxygenation: Pre-oxygenation with 100% oxygen Induction Type: IV induction Ventilation: Mask ventilation without difficulty and Oral airway inserted - appropriate to patient size Laryngoscope Size: 3 and McGraph Grade View: Grade I Tube type: Oral Tube size: 7.0 mm Number of attempts: 1 Airway Equipment and Method: Stylet and Video-laryngoscopy Placement Confirmation: ETT inserted through vocal cords under direct vision,  positive ETCO2 and breath sounds checked- equal and bilateral Secured at: 21 cm Tube secured with: Tape Dental Injury: Teeth and Oropharynx as per pre-operative assessment  Difficulty Due To: Difficulty was anticipated and Difficult Airway- due to reduced neck mobility Future Recommendations: Recommend- induction with short-acting agent, and alternative techniques readily available

## 2018-06-29 NOTE — Anesthesia Post-op Follow-up Note (Signed)
Anesthesia QCDR form completed.        

## 2018-06-29 NOTE — Op Note (Signed)
Indications: Ms. Lapage is a 72 yo female who presented with lumbar radiculopathy without response to conservative management.  She elected for surgical intervention  Findings: compression at L4-5 and foraminal compression inferior to the L5 pedicle  Preoperative Diagnosis: Lumbar radiculopathy Postoperative Diagnosis: same   EBL: 100 ml IVF: 800 ml Drains: none Disposition: Extubated and Stable to PACU Complications: none  No foley catheter was placed.   Preoperative Note:   Risks of surgery discussed include: infection, bleeding, stroke, coma, death, paralysis, CSF leak, nerve/spinal cord injury, numbness, tingling, weakness, complex regional pain syndrome, recurrent stenosis and/or disc herniation, vascular injury, development of instability, neck/back pain, need for further surgery, persistent symptoms, development of deformity, and the risks of anesthesia. The patient understood these risks and agreed to proceed.  Operative Note:   1. L4-5 lumbar decompression including central laminectomy and bilateral medial facetectomies including foraminotomies 2. Left L5-S1 far lateral discectomy  The patient was then brought from the preoperative center with intravenous access established.  The patient underwent general anesthesia and endotracheal tube intubation, and was then rotated on the Donaldson rail top where all pressure points were appropriately padded.  The skin was then thoroughly cleansed.  Perioperative antibiotic prophylaxis was administered.  Sterile prep and drapes were then applied and a timeout was then observed.  C-arm was brought into the field under sterile conditions and under lateral visualization the L4-5 interspace was identified and marked.  The incision was marked on the left and injected with local anesthetic. Once this was complete a 2 cm incision was opened with the use of a #10 blade knife.    The metrx tubes were sequentially advanced and confirmed in position  at L4-5. An 34mm by 54mm tube was locked in place to the bed side attachment.  The microscope was then sterilely brought into the field and muscle creep was hemostased with a bipolar and resected with a pituitary rongeur.  A Bovie extender was then used to expose the spinous process and lamina.  Careful attention was placed to not violate the facet capsule. A 3 mm matchstick drill bit was then used to make a hemi-laminotomy trough until the ligamentum flavum was exposed.  This was extended to the base of the spinous process and to the contralateral side to remove all the central bone from each side.  Once this was complete and the underlying ligamentum flavum was visualized, it was dissected with a curette and resected with Kerrison rongeurs.  Extensive ligamentum hypertrophy was noted, requiring a substantial amount of time and care for removal.  The dura was identified and palpated. The kerrison rongeur was then used to remove the medial facet bilaterally until no compression was noted.  A balltip probe was used to confirm decompression of the left L5 nerve root. During this palpation, there was additional compression noted in the foramen inferior to the L5 pedicle.  This was not visualizable through this approach, so a decision was made to make and additional incision for decompression of the L5-S1 foramen.  Additional attention was paid to completion of the contralateral L4-5 foraminotomy until the right L5 nerve root was completely free.  Once this was complete, L4-5 central decompression including medial facetectomy and foraminotomy was confirmed and decompression on both sides was confirmed. No CSF leak was noted.  A Depo-Medrol soaked Gelfoam pledget was placed in the defect.  The wound was copiously irrigated. The tube system was then removed under microscopic visualization and hemostasis was obtained with a bipolar.  An additional incision was marked 3 cm lateral to the midline over the S1 pedicle.   The incision was made, then the fascia incised. The tube system was docked on the S1 transverse process and locked.  The microscope was used to visualize the transverse process.  Bovie electrocautery was used to expose the L S1 transverse process to the lateral margin of the S1 superior articular process.  A curette was used to disconnect the intertransverse membrane.  The L S1 pedicle was palpated along its superior and lateral margins to the vertebral body, allowing for identification of the L5-S1 disc margin.  The L5 nerve root was identified, and compression noted coming from the disc space.  At this point, the disc herniation and soft tissue compressing the L5 nerve root were dissected free and removed in piecemeal fashion for lateral foraminal decompression and lateral L5-S1 discectomy. Depomedrol was placed along the nerve and hemostasis achieved.  At this point, the tube was removed from the lateral incision and hemostasis confirmed.  On each incision, the fascial layer was reapproximated with the use of a 0 Vicryl suture.  Subcutaneous tissue layer was reapproximated using 2-0 Vicryl suture.  3-0 monocryl was placed in subcuticular fashion. The skin was then cleansed and Dermabond was used to close the skin opening.  Patient was then rotated back to the preoperative bed awakened from anesthesia and taken to recovery all counts are correct in this case.  I performed the entire procedure with the assistance of Marin Olp PA as an Pensions consultant.  Brynn Mulgrew K. Izora Ribas MD

## 2018-06-29 NOTE — Anesthesia Postprocedure Evaluation (Signed)
Anesthesia Post Note  Patient: Kelsey Carter  Procedure(s) Performed: LUMBAR LAMINECTOMY/DECOMPRESSION MICRODISCECTOMY 1 LEVEL-L4-5, L5-S1 LATERAL DISCECTOMY (N/A Back)  Patient location during evaluation: PACU Anesthesia Type: General Level of consciousness: awake and alert Pain management: pain level controlled Vital Signs Assessment: post-procedure vital signs reviewed and stable Respiratory status: spontaneous breathing and respiratory function stable Cardiovascular status: stable Anesthetic complications: no     Last Vitals:  Vitals:   06/29/18 1515 06/29/18 1527  BP: 94/69 112/69  Pulse: 72 80  Resp: 12 13  Temp:    SpO2: 100% 99%    Last Pain:  Vitals:   06/29/18 1515  TempSrc:   PainSc: 0-No pain                 Murlin Schrieber K

## 2018-06-29 NOTE — Discharge Summary (Signed)
  History: Kelsey Carter is here for L4-5 lumbar decompression including central laminectomy and bilateral medial facetectomies including foraminotomies, as well as, Left L5-S1 far lateral discectomy. Tolerated procedure well. Patient evaluated in PACU following procedure and still disoriented from anesthesia.  Pain: 6/10 described as back pan at incision site Denies lower extremity pain and states that she would normally feel it at this time.   Physical Exam: Vitals:   06/29/18 0959  BP: (!) 154/83  Pulse: 76  Resp: 16  Temp: (!) 97.1 F (36.2 C)  SpO2: 98%    AA Ox3 CNI Skin: incision sites intact. No bleeding. Glue present and intact.  Strength:4/5 throughout upper an lower extremities. Decreased effort (sleepy from anesthesia) Sensation: Intact and symmetric lower extremities.   Data:  Recent Labs  Lab 06/27/18 1349  NA 139  K 4.1  CL 104  CO2 25  BUN 13  CREATININE 0.91  GLUCOSE 111*  CALCIUM 9.9   No results for input(s): AST, ALT, ALKPHOS in the last 168 hours.  Invalid input(s): TBILI   Recent Labs  Lab 06/27/18 1349  WBC 6.4  HGB 12.2  HCT 34.7*  PLT 257   Recent Labs  Lab 06/27/18 1349  APTT 29  INR 0.98         Other tests/results: No imaging reviewed.   Assessment/Plan:  Kelsey Carter is recovering well following L4-5 lumbar decompression including central laminectomy and bilateral medial facetectomies including foraminotomies, as well as,  Left L5-S1 far lateral discectomy.  Will continue pain control with robaxin and tramadol as needed.  Written post op instructions provided.  2 week follow up in clinic already scheduled.   Marin Olp PA-C Department of Neurosurgery

## 2018-06-29 NOTE — H&P (Signed)
Heart sounds normal no MRG. Chest Clear to Auscultation Bilaterally.  I have reviewed and confirmed my history and physical from 06/22/2018 with no additions or changes. Plan for L4-5 decompression.  Risks and benefits reviewed.

## 2018-06-30 ENCOUNTER — Encounter: Payer: Self-pay | Admitting: Neurosurgery

## 2018-08-16 DIAGNOSIS — E782 Mixed hyperlipidemia: Secondary | ICD-10-CM | POA: Diagnosis not present

## 2018-08-16 DIAGNOSIS — I10 Essential (primary) hypertension: Secondary | ICD-10-CM | POA: Diagnosis not present

## 2018-08-16 DIAGNOSIS — R9431 Abnormal electrocardiogram [ECG] [EKG]: Secondary | ICD-10-CM | POA: Diagnosis not present

## 2018-08-16 DIAGNOSIS — R42 Dizziness and giddiness: Secondary | ICD-10-CM | POA: Diagnosis not present

## 2018-08-16 DIAGNOSIS — I2510 Atherosclerotic heart disease of native coronary artery without angina pectoris: Secondary | ICD-10-CM | POA: Diagnosis not present

## 2018-08-23 DIAGNOSIS — R42 Dizziness and giddiness: Secondary | ICD-10-CM | POA: Diagnosis not present

## 2018-08-29 DIAGNOSIS — E782 Mixed hyperlipidemia: Secondary | ICD-10-CM | POA: Diagnosis not present

## 2018-08-29 DIAGNOSIS — R42 Dizziness and giddiness: Secondary | ICD-10-CM | POA: Diagnosis not present

## 2018-08-29 DIAGNOSIS — I10 Essential (primary) hypertension: Secondary | ICD-10-CM | POA: Diagnosis not present

## 2018-08-29 DIAGNOSIS — I2510 Atherosclerotic heart disease of native coronary artery without angina pectoris: Secondary | ICD-10-CM | POA: Diagnosis not present

## 2018-09-07 ENCOUNTER — Other Ambulatory Visit: Payer: Self-pay | Admitting: *Deleted

## 2018-09-07 DIAGNOSIS — G4700 Insomnia, unspecified: Secondary | ICD-10-CM

## 2018-09-07 MED ORDER — ALPRAZOLAM 1 MG PO TABS: ORAL_TABLET | ORAL | 2 refills | Status: DC

## 2018-09-11 NOTE — Progress Notes (Signed)
[place]  [phone] [phone]  ID: Kelsey Carter OB: Oct 28, 1946  MR#: [id number]  WIO#:[id number]  Patient Care Team: Juline Patch, MD as PCP - General (Family Medicine) Corey Skains, MD as Consulting Physician (Cardiology) Lloyd Huger, MD as Consulting Physician (Oncology) Renata Caprice as Physician Assistant (Orthopedic Surgery)  CHIEF COMPLAINT: Bilateral breast cancer.  INTERVAL HISTORY: Patient returns to clinic today for routine six-month follow-up.  She continues to have multiple medical complaints that are chronic in nature.  She has ongoing significant back pain.  She has chronic weakness and fatigue.  She is tolerating anastrozole well without significant side effects.  She continues to have a persistent peripheral neuropathy, but no other neurologic complaints.  She has chronic diarrhea.  She has a poor appetite, but denies weight loss.  She has no chest pain or shortness of breath. She denies any nausea, vomiting, or constipation. She has no urinary complaints.  Patient offers no further specific complaints today.  REVIEW OF SYSTEMS:   Review of Systems  Constitutional: Positive for malaise/fatigue. Negative for fever and weight loss.  Respiratory: Negative.  Negative for cough and shortness of breath.   Cardiovascular: Negative.  Negative for chest pain and leg swelling.  Gastrointestinal: Positive for diarrhea. Negative for abdominal pain.  Genitourinary: Negative.   Musculoskeletal: Positive for back pain and joint pain.  Skin: Negative.  Negative for rash.  Neurological: Positive for tingling, sensory change and weakness. Negative for focal weakness.  Psychiatric/Behavioral: The patient is nervous/anxious and has insomnia.     As per HPI. Otherwise, a complete review of systems is negative.  PAST MEDICAL HISTORY: Past Medical History:  Diagnosis Date  . Anemia    vitamin d deficiency  .  Anxiety   . Arthritis    back, DDD  . Breast cancer ([place])    bilateral mastectomies.  different types of cancer in each breast  . Cardiomyopathy due to chemotherapy ([place]) 2011  . Chronic pain 2019  . Complication of anesthesia   . Coronary artery disease   . GERD (gastroesophageal reflux disease)   . Hyperlipidemia   . Hypertension   . Lumbar herniated disc   . Peripheral vascular disease ([facility])    neuropathies in extremities due to chemotherapy  . PONV (postoperative nausea and vomiting)     PAST SURGICAL HISTORY: Past Surgical History:  Procedure Laterality Date  . APPENDECTOMY    . BREAST IMPLANT REMOVAL Bilateral 2016   d/t infection. nothing replaced  . CARDIAC CATHETERIZATION  2018   no blockages. pt thought it was an MI, but it wasn't  . CESAREAN SECTION     x 3  . EYE SURGERY Bilateral 2013   cataract extractions with iol. cornea replacement also  . LUMBAR LAMINECTOMY/DECOMPRESSION MICRODISCECTOMY N/A 06/29/2018   Procedure: LUMBAR LAMINECTOMY/DECOMPRESSION MICRODISCECTOMY 1 LEVEL-L4-5, L5-S1 LATERAL DISCECTOMY;  Surgeon: Meade Maw, MD;  Location: [facility] ORS;  Service: Neurosurgery;  Laterality: N/A;  . MASTECTOMY  2011, 2016   5 diff procedures. reconstruction saline removed d/t infx  . nsvd     x 1  . PLACEMENT OF BREAST IMPLANTS Bilateral 2016  . PORT-A-CATH REMOVAL  2016  . TONSILLECTOMY      FAMILY HISTORY: Family History  Problem Relation Age of Onset  . Hypertension Mother   . Cervical cancer Mother   . Renal Disease Mother   . Cancer Father   . Breast cancer Paternal Aunt   . Tuberculosis Maternal  Grandfather   . Breast cancer Paternal Grandmother     ADVANCED DIRECTIVES (Y/N):  N  HEALTH MAINTENANCE: Social History   Tobacco Use  . Smoking status: Never Smoker  . Smokeless tobacco: Never Used  . Tobacco comment: smoking cessation materials not required  Substance Use Topics  . Alcohol use: No  . Drug use: No      Colonoscopy:  PAP:  Bone density:  Lipid panel:  Allergies  Allergen Reactions  . Hydrocodone-Acetaminophen Nausea And Vomiting    Severe vomitting  . Ciprofloxacin Other (See Comments)    Joint pain   . Gabapentin Other (See Comments)    Eyes blurry; vision decreased, dizziness     Current Outpatient Medications  Medication Sig Dispense Refill  . alendronate (FOSAMAX) 70 MG tablet Take 70 mg by mouth every Wednesday.     . ALPRAZolam (XANAX) 1 MG tablet TAKE 1 TABLET(1 MG) BY MOUTH AT BEDTIME AS NEEDED FOR ANXIETY 30 tablet 2  . anastrozole (ARIMIDEX) 1 MG tablet Take 1 tablet (1 mg total) by mouth daily. (Patient taking differently: Take 1 mg by mouth daily at 3 pm. ) 90 tablet 3  . atorvastatin (LIPITOR) 40 MG tablet Take 1 tablet (40 mg total) by mouth at bedtime. 90 tablet 3  . Calcium Citrate-Vitamin D (CALCIUM + D PO) Take 1 tablet by mouth every Wednesday.    . Cholecalciferol (VITAMIN D) 2000 units CAPS Take 1 capsule (2,000 Units total) by mouth daily. 30 capsule   . esomeprazole (NEXIUM) 20 MG capsule Take 20 mg by mouth daily as needed (heartburn).    . hydrochlorothiazide (HYDRODIURIL) 12.5 MG tablet Take 1 tablet (12.5 mg total) by mouth daily. 90 tablet 3  . Magnesium 250 MG TABS Take 250 mg by mouth daily.    . metoprolol succinate (TOPROL-XL) 50 MG 24 hr tablet Take 50 mg by mouth daily.     Marland Kitchen telmisartan (MICARDIS) 80 MG tablet Take 1 tablet (80 mg total) by mouth daily. 90 tablet 3  . methocarbamol (ROBAXIN) 500 MG tablet Take 1 tablet (500 mg total) by mouth every 6 (six) hours as needed for muscle spasms. (Patient not taking: Reported on 09/13/2018) 60 tablet 0  . naproxen sodium (ALEVE) 220 MG tablet Take 220 mg by mouth daily as needed.     No current facility-administered medications for this visit.     OBJECTIVE: Vitals:   09/13/18 1439  BP: (!) 142/86  Pulse: 78  Resp: 18  Temp: (!) 96.4 F (35.8 C)     Body mass index is 28.79 kg/m.    ECOG  FS:0 - Asymptomatic  General: Well-developed, well-nourished, no acute distress. Eyes: Pink conjunctiva, anicteric sclera. HEENT: Normocephalic, moist mucous membranes. Breast: Bilateral mastectomy.  Patient request exam deferred today. Lungs: Clear to auscultation bilaterally. Heart: Regular rate and rhythm. No rubs, murmurs, or gallops. Abdomen: Soft, nontender, nondistended. No organomegaly noted, normoactive bowel sounds. Musculoskeletal: No edema, cyanosis, or clubbing. Neuro: Alert, answering all questions appropriately. Cranial nerves grossly intact. Skin: No rashes or petechiae noted. Psych: Normal affect.  LAB RESULTS:  Lab Results  Component Value Date   NA 139 06/27/2018   K 4.1 06/27/2018   CL 104 06/27/2018   CO2 25 06/27/2018   GLUCOSE 111 (H) 06/27/2018   BUN 13 06/27/2018   CREATININE 0.91 06/27/2018   CALCIUM 9.9 06/27/2018   PROT 7.7 03/22/2018   ALBUMIN 4.4 03/22/2018   AST 25 03/22/2018   ALT 31 03/22/2018  ALKPHOS 79 03/22/2018   BILITOT 0.9 03/22/2018   GFRNONAA >60 06/27/2018   GFRAA >60 06/27/2018    Lab Results  Component Value Date   WBC 6.4 06/27/2018   NEUTROABS 3.3 06/27/2018   HGB 12.2 06/27/2018   HCT 34.7 (L) 06/27/2018   MCV 93.8 06/27/2018   PLT 257 06/27/2018     STUDIES: No results found.  ASSESSMENT: Bilateral breast cancer.  PLAN:    1. Bilateral breast cancer: Patient has a history of a pathologic stage IA adenocarcinoma of the left breast which was ER and HER-2 negative, PR 5%. Patient received chemotherapy and XRT for her initial breast cancer.  In May ~2015 she was diagnosed with a second breast cancer in [place] that was noted to be ER/PR positive and HER-2 negative. Patient underwent bilateral mastectomy and therefore did not require adjuvant XRT. She was initiated on anastrozole and will complete 5 years of treatment in May 2020.  Although given patient's history of bilateral breast cancer, will consider extended  treatment up to 7 years.  Patient does not require any further mammograms.  Return to clinic in 6 months for routine evaluation.   2. Anxiety/insomnia: Continue Xanax, but patient stressed understanding that her primary care physician will have to prescribe this in the near future. 3.  Peripheral neuropathy: Chronic and unchanged.  Patient does not wish any intervention at this time. 4.  Osteoporosis: Patient had a bone marrow density on October 06, 2017 revealed a T score of -2.8.  Continue Fosamax, calcium, and vitamin D supplementation.  Repeat in the next 1 to 2 weeks. 5.  Diarrhea: Patient was given a referral to GI.    Patient expressed understanding and was in agreement with this plan. She also understands that She can call clinic at any time with any questions, concerns, or complaints.    Lloyd Huger, MD   09/17/2018 7:18 AM

## 2018-09-13 ENCOUNTER — Other Ambulatory Visit: Payer: Self-pay

## 2018-09-13 ENCOUNTER — Inpatient Hospital Stay: Payer: PPO | Attending: Oncology | Admitting: Oncology

## 2018-09-13 VITALS — BP 142/86 | HR 78 | Temp 96.4°F | Resp 18 | Wt 173.0 lb

## 2018-09-13 DIAGNOSIS — Z170 Estrogen receptor positive status [ER+]: Secondary | ICD-10-CM | POA: Insufficient documentation

## 2018-09-13 DIAGNOSIS — C50912 Malignant neoplasm of unspecified site of left female breast: Secondary | ICD-10-CM | POA: Insufficient documentation

## 2018-09-13 DIAGNOSIS — Z79811 Long term (current) use of aromatase inhibitors: Secondary | ICD-10-CM | POA: Diagnosis not present

## 2018-09-13 DIAGNOSIS — C50911 Malignant neoplasm of unspecified site of right female breast: Secondary | ICD-10-CM | POA: Insufficient documentation

## 2018-09-13 NOTE — Progress Notes (Signed)
Here for follow up. Feeling " very fatigue-always tired " per pt.

## 2018-09-21 ENCOUNTER — Ambulatory Visit (INDEPENDENT_AMBULATORY_CARE_PROVIDER_SITE_OTHER): Payer: PPO | Admitting: Family Medicine

## 2018-09-21 ENCOUNTER — Other Ambulatory Visit
Admission: RE | Admit: 2018-09-21 | Discharge: 2018-09-21 | Disposition: A | Discharge status: Home / Self Care | Payer: PPO | Source: Ambulatory Visit | Attending: Family Medicine | Admitting: Family Medicine

## 2018-09-21 ENCOUNTER — Encounter: Payer: Self-pay | Admitting: Family Medicine

## 2018-09-21 VITALS — BP 130/88 | HR 64 | Ht 65.0 in | Wt 172.0 lb

## 2018-09-21 DIAGNOSIS — E785 Hyperlipidemia, unspecified: Secondary | ICD-10-CM

## 2018-09-21 DIAGNOSIS — R7303 Prediabetes: Secondary | ICD-10-CM

## 2018-09-21 LAB — LIPID PANEL
Cholesterol: 130 mg/dL (ref 0–200)
HDL: 49 mg/dL (ref >40)
LDL Cholesterol: 59 mg/dL (ref 0–99)
Total CHOL/HDL Ratio: 2.7 RATIO
Triglycerides: 108 mg/dL (ref <150)
VLDL: 22 mg/dL (ref 0–40)

## 2018-09-21 MED ORDER — ATORVASTATIN CALCIUM 40 MG PO TABS: 40.0000 mg | ORAL_TABLET | Freq: Every day | ORAL | 3 refills | Status: DC

## 2018-09-21 NOTE — Progress Notes (Signed)
Date:  09/21/2018   Name:  Kelsey Carter   DOB:  02/26/1946   MRN:  [id number]   Chief Complaint: Hyperlipidemia Hyperlipidemia  This is a chronic problem. The current episode started more than 1 year ago. The problem is controlled. Recent lipid tests were reviewed and are normal. She has no history of chronic renal disease or diabetes. Pertinent negatives include no myalgias or shortness of breath.     Review of Systems  Constitutional: Negative.  Negative for chills, fatigue, fever and unexpected weight change.  HENT: Negative for congestion, ear discharge, ear pain, rhinorrhea, sinus pressure, sneezing and sore throat.   Eyes: Negative for photophobia, pain, discharge, redness and itching.  Respiratory: Negative for cough, shortness of breath, wheezing and stridor.   Gastrointestinal: Negative for abdominal pain, blood in stool, constipation, diarrhea, nausea and vomiting.  Endocrine: Negative for cold intolerance, heat intolerance, polydipsia, polyphagia and polyuria.  Genitourinary: Negative for dysuria, flank pain, frequency, hematuria, menstrual problem, pelvic pain, urgency, vaginal bleeding and vaginal discharge.  Musculoskeletal: Negative for arthralgias, back pain and myalgias.  Skin: Negative for rash.  Allergic/Immunologic: Negative for environmental allergies and food allergies.  Neurological: Negative for dizziness, weakness, light-headedness, numbness and headaches.  Hematological: Negative for adenopathy. Does not bruise/bleed easily.  Psychiatric/Behavioral: Negative for dysphoric mood. The patient is not nervous/anxious.     Patient Active Problem List   Diagnosis Date Noted  . Anxiety 03/23/2018  . Prediabetes 03/23/2018  . DDD (degenerative disc disease), lumbar 03/02/2018  . Chronic low back pain (Primary Area of Pain) (Bilateral) (L>R) with left-sided sciatica 02/15/2018  . Chronic lower extremity pain (Secondary Area of Pain) (Left) 02/15/2018  .  Chronic pain syndrome 02/15/2018  . Long term prescription benzodiazepine use 02/15/2018  . Disorder of skeletal system 02/15/2018  . Problems influencing health status 02/15/2018  . Chronic sacroiliac joint pain 02/15/2018  . Benign essential HTN 10/05/2017  . Coronary artery disease involving native coronary artery of native heart 10/05/2017  . Hyperlipidemia, mixed 10/05/2017  . Elevated TSH 09/21/2017  . Cardiomyopathy due to chemotherapy ([place]) 08/24/2017  . Insomnia 08/24/2017  . Peripheral neuropathy due to chemotherapy ([place]) 08/24/2017  . Bilateral breast cancer ([place]) 08/24/2017  . Onychomycosis 08/24/2017  . Overweight (BMI 25.0-29.9) 08/24/2017  . Vitamin D deficiency 08/24/2017  . Hyperlipidemia 08/24/2017    Allergies  Allergen Reactions  . Hydrocodone-Acetaminophen Nausea And Vomiting    Severe vomitting  . Ciprofloxacin Other (See Comments)    Joint pain   . Gabapentin Other (See Comments)    Eyes blurry; vision decreased, dizziness     Past Surgical History:  Procedure Laterality Date  . APPENDECTOMY    . BREAST IMPLANT REMOVAL Bilateral 2016   d/t infection. nothing replaced  . CARDIAC CATHETERIZATION  2018   no blockages. pt thought it was an MI, but it wasn't  . CESAREAN SECTION     x 3  . EYE SURGERY Bilateral 2013   cataract extractions with iol. cornea replacement also  . LUMBAR LAMINECTOMY/DECOMPRESSION MICRODISCECTOMY N/A 06/29/2018   Procedure: LUMBAR LAMINECTOMY/DECOMPRESSION MICRODISCECTOMY 1 LEVEL-L4-5, L5-S1 LATERAL DISCECTOMY;  Surgeon: Meade Maw, MD;  Location: [facility] ORS;  Service: Neurosurgery;  Laterality: N/A;  . MASTECTOMY  2011, 2016   5 diff procedures. reconstruction saline removed d/t infx  . nsvd     x 1  . PLACEMENT OF BREAST IMPLANTS Bilateral 2016  . PORT-A-CATH REMOVAL  2016  . TONSILLECTOMY      Social History  Tobacco Use  . Smoking status: Never Smoker  . Smokeless tobacco: Never Used  . Tobacco comment:  smoking cessation materials not required  Substance Use Topics  . Alcohol use: No  . Drug use: No     Medication list has been reviewed and updated.  Current Meds  Medication Sig  . alendronate (FOSAMAX) 70 MG tablet Take 70 mg by mouth every Wednesday.   . ALPRAZolam (XANAX) 1 MG tablet TAKE 1 TABLET(1 MG) BY MOUTH AT BEDTIME AS NEEDED FOR ANXIETY (Patient taking differently: TAKE 1 TABLET(1 MG) BY MOUTH AT BEDTIME AS NEEDED FOR ANXIETY/ Dr Grayland Ormond)  . anastrozole (ARIMIDEX) 1 MG tablet Take 1 tablet (1 mg total) by mouth daily. (Patient taking differently: Take 1 mg by mouth daily at 3 pm. )  . atorvastatin (LIPITOR) 40 MG tablet Take 1 tablet (40 mg total) by mouth at bedtime.  . Calcium Citrate-Vitamin D (CALCIUM + D PO) Take 1 tablet by mouth every Wednesday.  . Cholecalciferol (VITAMIN D) 2000 units CAPS Take 1 capsule (2,000 Units total) by mouth daily.  Marland Kitchen esomeprazole (NEXIUM) 20 MG capsule Take 20 mg by mouth daily as needed (heartburn).  . Magnesium 250 MG TABS Take 250 mg by mouth daily.  . metoprolol succinate (TOPROL-XL) 50 MG 24 hr tablet Take 50 mg by mouth daily.   . naproxen sodium (ALEVE) 220 MG tablet Take 220 mg by mouth daily as needed.  Marland Kitchen telmisartan (MICARDIS) 80 MG tablet Take 1 tablet (80 mg total) by mouth daily.  . [DISCONTINUED] atorvastatin (LIPITOR) 40 MG tablet Take 1 tablet (40 mg total) by mouth at bedtime.    PHQ 2/9 Scores 09/21/2018 03/31/2018 03/03/2018 03/02/2018  PHQ - 2 Score 0 0 0 0  PHQ- 9 Score 0 0 - -    Physical Exam  Constitutional: She is oriented to person, place, and time. She appears well-developed and well-nourished.  HENT:  Head: Normocephalic.  Right Ear: External ear normal.  Left Ear: External ear normal.  Mouth/Throat: Oropharynx is clear and moist.  Eyes: Pupils are equal, round, and reactive to light. Conjunctivae and EOM are normal. Lids are everted and swept, no foreign bodies found. Left eye exhibits no hordeolum. No  foreign body present in the left eye. Right conjunctiva is not injected. Left conjunctiva is not injected. No scleral icterus.  Neck: Normal range of motion. Neck supple. No JVD present. No tracheal deviation present. No thyromegaly present.  Cardiovascular: Normal rate, regular rhythm, S1 normal, S2 normal, normal heart sounds and intact distal pulses. Exam reveals no gallop, no S3, no S4 and no friction rub.  No murmur heard.  No systolic murmur is present.  No diastolic murmur is present. Pulses:      Carotid pulses are 2+ on the right side, and 2+ on the left side.      Radial pulses are 2+ on the right side, and 2+ on the left side.       Femoral pulses are 2+ on the right side, and 2+ on the left side.      Popliteal pulses are 2+ on the right side, and 2+ on the left side.       Dorsalis pedis pulses are 2+ on the right side, and 2+ on the left side.       Posterior tibial pulses are 2+ on the right side, and 2+ on the left side.  Pulmonary/Chest: Effort normal and breath sounds normal. No respiratory distress. She has no wheezes.  She has no rales.  Abdominal: Soft. Bowel sounds are normal. She exhibits no mass. There is no hepatosplenomegaly. There is no tenderness. There is no rebound and no guarding.  Musculoskeletal: Normal range of motion. She exhibits no edema or tenderness.  Lymphadenopathy:    She has no cervical adenopathy.  Neurological: She is alert and oriented to person, place, and time. She has normal strength. She displays normal reflexes. No cranial nerve deficit.  Skin: Skin is warm. No rash noted.  Psychiatric: She has a normal mood and affect. Her mood appears not anxious. She does not exhibit a depressed mood.  Nursing note and vitals reviewed.   BP 130/88   Pulse 64   Ht 5\' 5"  (1.651 m)   Wt 172 lb (78 kg)   BMI 28.62 kg/m   Assessment and Plan: 1. Hyperlipidemia, unspecified hyperlipidemia type Chronic Controlled. Continue atorvastatin 40 mg daily. Check  lipid panel. - atorvastatin (LIPITOR) 40 MG tablet; Take 1 tablet (40 mg total) by mouth at bedtime.  Dispense: 90 tablet; Refill: 3 - Lipid panel  2. Prediabetes Chronic controlled with diet. Check A1C. - Hemoglobin A1c.   Dr. Mac[facility]  09/21/2018

## 2018-09-23 LAB — HEMOGLOBIN A1C
Hgb A1c MFr Bld: 5.6 % (ref 4.8–5.6)
Mean Plasma Glucose: 114 mg/dL

## 2018-09-28 DIAGNOSIS — M5416 Radiculopathy, lumbar region: Secondary | ICD-10-CM | POA: Diagnosis not present

## 2018-09-28 DIAGNOSIS — M6281 Muscle weakness (generalized): Secondary | ICD-10-CM | POA: Diagnosis not present

## 2018-10-04 DIAGNOSIS — M6281 Muscle weakness (generalized): Secondary | ICD-10-CM | POA: Diagnosis not present

## 2018-10-04 DIAGNOSIS — M5416 Radiculopathy, lumbar region: Secondary | ICD-10-CM | POA: Diagnosis not present

## 2018-10-10 ENCOUNTER — Other Ambulatory Visit: Payer: Self-pay | Admitting: *Deleted

## 2018-10-10 ENCOUNTER — Ambulatory Visit
Admission: RE | Admit: 2018-10-10 | Discharge: 2018-10-10 | Disposition: A | Discharge status: Home / Self Care | Payer: PPO | Source: Ambulatory Visit | Attending: Oncology | Admitting: Oncology

## 2018-10-10 ENCOUNTER — Telehealth: Payer: Self-pay | Admitting: *Deleted

## 2018-10-10 DIAGNOSIS — Z780 Asymptomatic menopausal state: Secondary | ICD-10-CM | POA: Insufficient documentation

## 2018-10-10 DIAGNOSIS — Z7983 Long term (current) use of bisphosphonates: Secondary | ICD-10-CM | POA: Insufficient documentation

## 2018-10-10 DIAGNOSIS — C50911 Malignant neoplasm of unspecified site of right female breast: Secondary | ICD-10-CM

## 2018-10-10 DIAGNOSIS — Z853 Personal history of malignant neoplasm of breast: Secondary | ICD-10-CM | POA: Insufficient documentation

## 2018-10-10 DIAGNOSIS — M85851 Other specified disorders of bone density and structure, right thigh: Secondary | ICD-10-CM | POA: Diagnosis not present

## 2018-10-10 DIAGNOSIS — G4700 Insomnia, unspecified: Secondary | ICD-10-CM

## 2018-10-10 DIAGNOSIS — M810 Age-related osteoporosis without current pathological fracture: Secondary | ICD-10-CM | POA: Insufficient documentation

## 2018-10-10 DIAGNOSIS — Z13820 Encounter for screening for osteoporosis: Secondary | ICD-10-CM | POA: Insufficient documentation

## 2018-10-10 DIAGNOSIS — C50912 Malignant neoplasm of unspecified site of left female breast: Secondary | ICD-10-CM

## 2018-10-10 DIAGNOSIS — Z170 Estrogen receptor positive status [ER+]: Secondary | ICD-10-CM

## 2018-10-10 HISTORY — DX: Personal history of antineoplastic chemotherapy: Z92.21

## 2018-10-10 HISTORY — DX: Personal history of irradiation: Z92.3

## 2018-10-10 MED ORDER — ALENDRONATE SODIUM 70 MG PO TABS: 70.0000 mg | ORAL_TABLET | ORAL | 6 refills | Status: DC

## 2018-10-10 NOTE — Telephone Encounter (Signed)
Patient notified of bone density results, per Dr. Grayland Ormond slightly worse than previous exam. Patient reports she continues to take Fosamax and Calcium with Vitamin D. Patient verbalizes understanding of results and importance of continuing medications. Patient also request refill for xanax while on call, refill will be routed to Rulon Abide, NP for approval.

## 2018-10-11 ENCOUNTER — Other Ambulatory Visit: Payer: Self-pay | Admitting: *Deleted

## 2018-10-11 DIAGNOSIS — G4700 Insomnia, unspecified: Secondary | ICD-10-CM

## 2018-10-11 MED ORDER — ALPRAZOLAM 1 MG PO TABS: ORAL_TABLET | ORAL | 0 refills | Status: DC

## 2018-10-12 DIAGNOSIS — M5416 Radiculopathy, lumbar region: Secondary | ICD-10-CM | POA: Diagnosis not present

## 2018-10-12 DIAGNOSIS — M6281 Muscle weakness (generalized): Secondary | ICD-10-CM | POA: Diagnosis not present

## 2018-10-14 DIAGNOSIS — H35373 Puckering of macula, bilateral: Secondary | ICD-10-CM | POA: Diagnosis not present

## 2018-10-18 ENCOUNTER — Ambulatory Visit
Admission: EM | Admit: 2018-10-18 | Discharge: 2018-10-18 | Disposition: A | Discharge status: Home / Self Care | Payer: PPO | Attending: Family Medicine | Admitting: Family Medicine

## 2018-10-18 ENCOUNTER — Other Ambulatory Visit: Payer: Self-pay

## 2018-10-18 ENCOUNTER — Ambulatory Visit: Payer: PPO | Admitting: Family Medicine

## 2018-10-18 DIAGNOSIS — R2232 Localized swelling, mass and lump, left upper limb: Secondary | ICD-10-CM | POA: Diagnosis not present

## 2018-10-18 NOTE — ED Provider Notes (Signed)
[facility]    CSN: [id number] Arrival date & time: 10/18/18  1120     History   Chief Complaint Chief Complaint  Patient presents with  . Abscess    HPI Kelsey Carter is a 72 y.o. female.   HPI  72 year old female presents with possible abscess under her left axilla.  She states that that she has had bilateral breast cancer and is status post bilateral mastectomy. For the last 2 days she has noticed that there is an area that is "puffy" and very painful.  Even to the point that she is unable to put her arm by her side without the pain.  Came in to have this assessed.  He is unable to tell me if that is more swollen than it was before if it was even there.  Is not warm there is no erythema there is no pointing.  Is very soft to the point of fluctuance but there is no induration present.  It is in the area of the tail of the breast superiorly.  Became concerned because this is how she first noticed the breast cancer.  Is not a solid tumor.          Past Medical History:  Diagnosis Date  . Anemia    vitamin d deficiency  . Anxiety   . Arthritis    back, DDD  . Breast cancer ([place])    bilateral mastectomies.  different types of cancer in each breast  . Cardiomyopathy due to chemotherapy ([place]) 2011  . Chronic pain 2019  . Complication of anesthesia   . Coronary artery disease   . GERD (gastroesophageal reflux disease)   . Hyperlipidemia   . Hypertension   . Lumbar herniated disc   . Peripheral vascular disease (HCC)    neuropathies in extremities due to chemotherapy  . Personal history of chemotherapy   . Personal history of radiation therapy   . PONV (postoperative nausea and vomiting)     Patient Active Problem List   Diagnosis Date Noted  . Anxiety 03/23/2018  . Prediabetes 03/23/2018  . DDD (degenerative disc disease), lumbar 03/02/2018  . Chronic low back pain (Primary Area of Pain) (Bilateral) (L>R) with left-sided sciatica 02/15/2018  .  Chronic lower extremity pain (Secondary Area of Pain) (Left) 02/15/2018  . Chronic pain syndrome 02/15/2018  . Long term prescription benzodiazepine use 02/15/2018  . Disorder of skeletal system 02/15/2018  . Problems influencing health status 02/15/2018  . Chronic sacroiliac joint pain 02/15/2018  . Benign essential HTN 10/05/2017  . Coronary artery disease involving native coronary artery of native heart 10/05/2017  . Hyperlipidemia, mixed 10/05/2017  . Elevated TSH 09/21/2017  . Cardiomyopathy due to chemotherapy ([place]) 08/24/2017  . Insomnia 08/24/2017  . Peripheral neuropathy due to chemotherapy ([place]) 08/24/2017  . Bilateral breast cancer ([place]) 08/24/2017  . Onychomycosis 08/24/2017  . Overweight (BMI 25.0-29.9) 08/24/2017  . Vitamin D deficiency 08/24/2017  . Hyperlipidemia 08/24/2017    Past Surgical History:  Procedure Laterality Date  . APPENDECTOMY    . BREAST IMPLANT REMOVAL Bilateral 2016   d/t infection. nothing replaced  . CARDIAC CATHETERIZATION  2018   no blockages. pt thought it was an MI, but it wasn't  . CESAREAN SECTION     x 3  . EYE SURGERY Bilateral 2013   cataract extractions with iol. cornea replacement also  . LUMBAR LAMINECTOMY/DECOMPRESSION MICRODISCECTOMY N/A 06/29/2018   Procedure: LUMBAR LAMINECTOMY/DECOMPRESSION MICRODISCECTOMY 1 LEVEL-L4-5, L5-S1 LATERAL DISCECTOMY;  Surgeon:  Meade Maw, MD;  Location: [facility] ORS;  Service: Neurosurgery;  Laterality: N/A;  . MASTECTOMY Bilateral 2011, 2016   5 diff procedures. reconstruction saline removed d/t infx  . nsvd     x 1  . PLACEMENT OF BREAST IMPLANTS Bilateral 2016  . PORT-A-CATH REMOVAL  2016  . TONSILLECTOMY      OB History   None      Home Medications    Prior to Admission medications   Medication Sig Start Date End Date Taking? Authorizing Provider  alendronate (FOSAMAX) 70 MG tablet Take 1 tablet (70 mg total) by mouth once a week. 10/10/18  Yes Lloyd Huger, MD    ALPRAZolam Duanne Moron) 1 MG tablet TAKE 1 TABLET(1 MG) BY MOUTH AT BEDTIME AS NEEDED FOR ANXIETY 10/11/18  Yes Burns, Wandra Feinstein, NP  anastrozole (ARIMIDEX) 1 MG tablet Take 1 tablet (1 mg total) by mouth daily. Patient taking differently: Take 1 mg by mouth daily at 3 pm.  04/29/18  Yes Lloyd Huger, MD  atorvastatin (LIPITOR) 40 MG tablet Take 1 tablet (40 mg total) by mouth at bedtime. 09/21/18  Yes Juline Patch, MD  Calcium Citrate-Vitamin D (CALCIUM + D PO) Take 1 tablet by mouth every Wednesday.   Yes [provider]  Cholecalciferol (VITAMIN D) 2000 units CAPS Take 1 capsule (2,000 Units total) by mouth daily. 08/25/17  Yes Plonk, Gwyndolyn Saxon, MD  esomeprazole (NEXIUM) 20 MG capsule Take 20 mg by mouth daily as needed (heartburn).   Yes [provider]  Magnesium 250 MG TABS Take 250 mg by mouth daily.   Yes [provider]  metoprolol succinate (TOPROL-XL) 50 MG 24 hr tablet Take 50 mg by mouth daily.  03/15/18  Yes [provider]  naproxen sodium (ALEVE) 220 MG tablet Take 220 mg by mouth daily as needed.   Yes [provider]  telmisartan (MICARDIS) 80 MG tablet Take 1 tablet (80 mg total) by mouth daily. 03/22/18  Yes Plonk, Gwyndolyn Saxon, MD    Family History Family History  Problem Relation Age of Onset  . Hypertension Mother   . Cervical cancer Mother   . Renal Disease Mother   . Cancer Father   . Breast cancer Paternal Aunt   . Tuberculosis Maternal Grandfather   . Breast cancer Paternal Grandmother     Social History Social History   Tobacco Use  . Smoking status: Never Smoker  . Smokeless tobacco: Never Used  . Tobacco comment: smoking cessation materials not required  Substance Use Topics  . Alcohol use: No  . Drug use: No     Allergies   Hydrocodone-acetaminophen; Ciprofloxacin; and Gabapentin   Review of Systems Review of Systems  Constitutional: Positive for activity change. Negative for appetite change, chills,  fatigue and fever.  All other systems reviewed and are negative.    Physical Exam Triage Vital Signs ED Triage Vitals  Enc Vitals Group     BP 10/18/18 1143 (!) 150/85     Pulse Rate 10/18/18 1143 83     Resp 10/18/18 1143 18     Temp 10/18/18 1143 98.2 F (36.8 C)     Temp Source 10/18/18 1143 Oral     SpO2 10/18/18 1143 100 %     Weight 10/18/18 1141 162 lb (73.5 kg)     Height 10/18/18 1141 5\' 4"  (1.626 m)     Head Circumference --      Peak Flow --      Pain  Score 10/18/18 1140 5     Pain Loc --      Pain Edu? --      Excl. in [place]? --    No data found.  Updated Vital Signs BP (!) 150/85 (BP Location: Right Arm)   Pulse 83   Temp 98.2 F (36.8 C) (Oral)   Resp 18   Ht 5\' 4"  (1.626 m)   Wt 162 lb (73.5 kg)   SpO2 100%   BMI 27.81 kg/m   Visual Acuity Right Eye Distance:   Left Eye Distance:   Bilateral Distance:    Right Eye Near:   Left Eye Near:    Bilateral Near:     Physical Exam  Constitutional: She is oriented to person, place, and time. She appears well-developed and well-nourished. No distress.  HENT:  Head: Normocephalic.  Eyes: Pupils are equal, round, and reactive to light. Right eye exhibits no discharge. Left eye exhibits no discharge.  Neck: Normal range of motion.  Pulmonary/Chest: She exhibits tenderness.  Musculoskeletal: Normal range of motion.  Neurological: She is alert and oriented to person, place, and time.  Skin: Skin is warm and dry. She is not diaphoretic.  Psychiatric: She has a normal mood and affect. Her behavior is normal. Judgment and thought content normal.  Nursing note and vitals reviewed.    UC Treatments / Results  Labs (all labs ordered are listed, but only abnormal results are displayed) Labs Reviewed - No data to display  EKG None  Radiology No results found.  Procedures  After explaining the procedure to the patient, needle aspiration of contents, she agreeded to proceed. The area was widely prepped  with cleansing prep pads. The  superficial skin and tissues were infiltrated with 1% Xylocaine  Plain. After Adequate anesthesia, the area was punctured with an 18-gauge needle advanced under aspiration with no return into the syringe.  Since nothing was able to be aspirated, the procedure was terminated at this point.  The patient tolerated the procedure well.  Medications Ordered in UC Medications - No data to display  Initial Impression / Assessment and Plan / UC Course  I have reviewed the triage vital signs and the nursing notes.  Pertinent labs & imaging results that were available during my care of the patient were reviewed by me and considered in my medical decision making (see chart for details).     Patient was reassured that no aspirate was obtained and this makes it less likely to be an abscess.  Recommended that she follow-up with her oncologist Dr.Finnegan for further evaluation. Final Clinical Impressions(s) / UC Diagnoses   Final diagnoses:  Mass of axilla, left     Discharge Instructions     Was not able to aspirate any fluid from the mass in the axilla.  It does not appear to be an abscess.  Recommend  follow up with Dr. Grayland Ormond ,your oncologist.   ED Prescriptions    None     Controlled Substance Prescriptions [place] Controlled Substance Registry consulted? Not Applicable   Lorin Picket, PA-C 10/18/18 1331

## 2018-10-18 NOTE — Discharge Instructions (Addendum)
Was not able to aspirate any fluid from the mass in the axilla.  It does not appear to be an abscess.  Recommend  follow up with Dr. Grayland Ormond ,your oncologist.

## 2018-10-18 NOTE — ED Triage Notes (Signed)
Patient is concerned that she may have an abscess under her axilla coming through to breast. Patient has had both breast surgically removed. Patient states that's he noticed this 2 days ago and has been very painful.

## 2018-10-19 ENCOUNTER — Inpatient Hospital Stay: Payer: PPO | Attending: Oncology | Admitting: Oncology

## 2018-10-19 ENCOUNTER — Encounter: Payer: Self-pay | Admitting: Oncology

## 2018-10-19 ENCOUNTER — Other Ambulatory Visit: Payer: Self-pay

## 2018-10-19 VITALS — BP 129/85 | HR 79 | Temp 97.0°F | Resp 16 | Ht 64.0 in | Wt 170.0 lb

## 2018-10-19 DIAGNOSIS — Z9013 Acquired absence of bilateral breasts and nipples: Secondary | ICD-10-CM | POA: Diagnosis not present

## 2018-10-19 DIAGNOSIS — G629 Polyneuropathy, unspecified: Secondary | ICD-10-CM | POA: Diagnosis not present

## 2018-10-19 DIAGNOSIS — G4700 Insomnia, unspecified: Secondary | ICD-10-CM | POA: Diagnosis not present

## 2018-10-19 DIAGNOSIS — Z79899 Other long term (current) drug therapy: Secondary | ICD-10-CM | POA: Insufficient documentation

## 2018-10-19 DIAGNOSIS — F419 Anxiety disorder, unspecified: Secondary | ICD-10-CM | POA: Diagnosis not present

## 2018-10-19 DIAGNOSIS — R222 Localized swelling, mass and lump, trunk: Secondary | ICD-10-CM | POA: Insufficient documentation

## 2018-10-19 DIAGNOSIS — Z79811 Long term (current) use of aromatase inhibitors: Secondary | ICD-10-CM

## 2018-10-19 DIAGNOSIS — C50911 Malignant neoplasm of unspecified site of right female breast: Secondary | ICD-10-CM | POA: Insufficient documentation

## 2018-10-19 DIAGNOSIS — Z170 Estrogen receptor positive status [ER+]: Secondary | ICD-10-CM | POA: Insufficient documentation

## 2018-10-19 DIAGNOSIS — C50912 Malignant neoplasm of unspecified site of left female breast: Secondary | ICD-10-CM | POA: Insufficient documentation

## 2018-10-19 DIAGNOSIS — M810 Age-related osteoporosis without current pathological fracture: Secondary | ICD-10-CM | POA: Insufficient documentation

## 2018-10-19 DIAGNOSIS — R2232 Localized swelling, mass and lump, left upper limb: Secondary | ICD-10-CM

## 2018-10-19 NOTE — Progress Notes (Signed)
Patient here for follow up. She has developed a swelling in her left axilla.

## 2018-10-20 DIAGNOSIS — M6281 Muscle weakness (generalized): Secondary | ICD-10-CM | POA: Diagnosis not present

## 2018-10-20 DIAGNOSIS — M5416 Radiculopathy, lumbar region: Secondary | ICD-10-CM | POA: Diagnosis not present

## 2018-10-21 NOTE — Progress Notes (Signed)
[place]  [phone] [phone]  ID: Kelsey Carter OB: Oct 05, 1946  MR#: [id number]  UYQ#:[id number]  Patient Care Team: Juline Patch, MD as PCP - General (Family Medicine) Corey Skains, MD as Consulting Physician (Cardiology) Lloyd Huger, MD as Consulting Physician (Oncology) Renata Caprice as Physician Assistant (Orthopedic Surgery)  CHIEF COMPLAINT: Bilateral breast cancer.  INTERVAL HISTORY: Patient returns to clinic today as an add-on after being evaluated in urgent care for a "mass" under her left axilla.  By report fine-needle aspiration did not express any fluid and there is a concern for recurrence.  She continues to have multiple medical complaints that are chronic in nature.  She has ongoing significant back pain.  She has chronic weakness and fatigue.  She is tolerating anastrozole well without significant side effects.  She continues to have a persistent peripheral neuropathy, but no other neurologic complaints. She has no chest pain or shortness of breath. She denies any nausea, vomiting, or constipation. She has no urinary complaints.  Patient offers no further specific complaints today.  REVIEW OF SYSTEMS:   Review of Systems  Constitutional: Positive for malaise/fatigue. Negative for fever and weight loss.  Respiratory: Negative.  Negative for cough and shortness of breath.   Cardiovascular: Negative.  Negative for chest pain and leg swelling.  Gastrointestinal: Positive for diarrhea. Negative for abdominal pain.  Genitourinary: Negative.   Musculoskeletal: Positive for back pain and joint pain.  Skin: Negative.  Negative for rash.  Neurological: Positive for tingling, sensory change and weakness. Negative for focal weakness.  Psychiatric/Behavioral: The patient is nervous/anxious and has insomnia.     As per HPI. Otherwise, a complete review of systems is negative.  PAST MEDICAL HISTORY: Past  Medical History:  Diagnosis Date  . Anemia    vitamin d deficiency  . Anxiety   . Arthritis    back, DDD  . Breast cancer ([place])    bilateral mastectomies.  different types of cancer in each breast  . Cardiomyopathy due to chemotherapy ([place]) 2011  . Chronic pain 2019  . Complication of anesthesia   . Coronary artery disease   . GERD (gastroesophageal reflux disease)   . Hyperlipidemia   . Hypertension   . Lumbar herniated disc   . Peripheral vascular disease (HCC)    neuropathies in extremities due to chemotherapy  . Personal history of chemotherapy   . Personal history of radiation therapy   . PONV (postoperative nausea and vomiting)     PAST SURGICAL HISTORY: Past Surgical History:  Procedure Laterality Date  . APPENDECTOMY    . BREAST IMPLANT REMOVAL Bilateral 2016   d/t infection. nothing replaced  . CARDIAC CATHETERIZATION  2018   no blockages. pt thought it was an MI, but it wasn't  . CESAREAN SECTION     x 3  . EYE SURGERY Bilateral 2013   cataract extractions with iol. cornea replacement also  . LUMBAR LAMINECTOMY/DECOMPRESSION MICRODISCECTOMY N/A 06/29/2018   Procedure: LUMBAR LAMINECTOMY/DECOMPRESSION MICRODISCECTOMY 1 LEVEL-L4-5, L5-S1 LATERAL DISCECTOMY;  Surgeon: Meade Maw, MD;  Location: [facility] ORS;  Service: Neurosurgery;  Laterality: N/A;  . MASTECTOMY Bilateral 2011, 2016   5 diff procedures. reconstruction saline removed d/t infx  . nsvd     x 1  . PLACEMENT OF BREAST IMPLANTS Bilateral 2016  . PORT-A-CATH REMOVAL  2016  . TONSILLECTOMY      FAMILY HISTORY: Family History  Problem Relation Age of Onset  . Hypertension Mother   .  Cervical cancer Mother   . Renal Disease Mother   . Cancer Father   . Breast cancer Paternal Aunt   . Tuberculosis Maternal Grandfather   . Breast cancer Paternal Grandmother     ADVANCED DIRECTIVES (Y/N):  N  HEALTH MAINTENANCE: Social History   Tobacco Use  . Smoking status: Never Smoker  . Smokeless  tobacco: Never Used  . Tobacco comment: smoking cessation materials not required  Substance Use Topics  . Alcohol use: No  . Drug use: No     Colonoscopy:  PAP:  Bone density:  Lipid panel:  Allergies  Allergen Reactions  . Hydrocodone-Acetaminophen Nausea And Vomiting    Severe vomitting  . Ciprofloxacin Other (See Comments)    Joint pain   . Gabapentin Other (See Comments)    Eyes blurry; vision decreased, dizziness     Current Outpatient Medications  Medication Sig Dispense Refill  . alendronate (FOSAMAX) 70 MG tablet Take 1 tablet (70 mg total) by mouth once a week. 4 tablet 6  . ALPRAZolam (XANAX) 1 MG tablet TAKE 1 TABLET(1 MG) BY MOUTH AT BEDTIME AS NEEDED FOR ANXIETY 30 tablet 0  . anastrozole (ARIMIDEX) 1 MG tablet Take 1 tablet (1 mg total) by mouth daily. (Patient taking differently: Take 1 mg by mouth daily at 3 pm. ) 90 tablet 3  . atorvastatin (LIPITOR) 40 MG tablet Take 1 tablet (40 mg total) by mouth at bedtime. 90 tablet 3  . Calcium Citrate-Vitamin D (CALCIUM + D PO) Take 1 tablet by mouth every Wednesday.    . Cholecalciferol (VITAMIN D) 2000 units CAPS Take 1 capsule (2,000 Units total) by mouth daily. 30 capsule   . esomeprazole (NEXIUM) 20 MG capsule Take 20 mg by mouth daily as needed (heartburn).    . Magnesium 250 MG TABS Take 250 mg by mouth daily.    . metoprolol succinate (TOPROL-XL) 50 MG 24 hr tablet Take 50 mg by mouth daily.     . naproxen sodium (ALEVE) 220 MG tablet Take 220 mg by mouth daily as needed.    Marland Kitchen telmisartan (MICARDIS) 80 MG tablet Take 1 tablet (80 mg total) by mouth daily. 90 tablet 3   No current facility-administered medications for this visit.     OBJECTIVE: Vitals:   10/19/18 0913 10/19/18 0918  BP:  129/85  Pulse:  79  Resp: 16   Temp:  (!) 97 F (36.1 C)     Body mass index is 29.18 kg/m.    ECOG FS:0 - Asymptomatic  General: Well-developed, well-nourished, no acute distress. Eyes: Pink conjunctiva, anicteric  sclera. HEENT: Normocephalic, moist mucous membranes. Breast: Bilateral mastectomy.  Small palpable fluctuant mass in left axilla. Lungs: Clear to auscultation bilaterally. Heart: Regular rate and rhythm. No rubs, murmurs, or gallops. Abdomen: Soft, nontender, nondistended. No organomegaly noted, normoactive bowel sounds. Musculoskeletal: No edema, cyanosis, or clubbing. Neuro: Alert, answering all questions appropriately. Cranial nerves grossly intact. Skin: No rashes or petechiae noted. Psych: Normal affect.  LAB RESULTS:  Lab Results  Component Value Date   NA 139 06/27/2018   K 4.1 06/27/2018   CL 104 06/27/2018   CO2 25 06/27/2018   GLUCOSE 111 (H) 06/27/2018   BUN 13 06/27/2018   CREATININE 0.91 06/27/2018   CALCIUM 9.9 06/27/2018   PROT 7.7 03/22/2018   ALBUMIN 4.4 03/22/2018   AST 25 03/22/2018   ALT 31 03/22/2018   ALKPHOS 79 03/22/2018   BILITOT 0.9 03/22/2018   GFRNONAA >60 06/27/2018  GFRAA >60 06/27/2018    Lab Results  Component Value Date   WBC 6.4 06/27/2018   NEUTROABS 3.3 06/27/2018   HGB 12.2 06/27/2018   HCT 34.7 (L) 06/27/2018   MCV 93.8 06/27/2018   PLT 257 06/27/2018     STUDIES: Dg Bone Density  Result Date: 10/10/2018 EXAM: DUAL X-RAY ABSORPTIOMETRY (DXA) FOR BONE MINERAL DENSITY IMPRESSION: Technologist: SCE PATIENT BIOGRAPHICAL: Name: Kelsey, Carter Patient ID: [id number] Birth Date: [birth year] Height: 63.5 in. Gender: Female Exam Date: 10/10/2018 Weight: 171.2 lbs. Indications: Advanced Age, Breast CA, Caucasian, High Risk Meds, History of Breast Cancer, History of Chemo, History of Radiation, History of Spinal Surgery, Postmenopausal, Vitamin D Deficiency Fractures: lt heel Treatments: Arimidex, calcium w/ vit D, Fosamax, Nexium, Vitamin D ASSESSMENT: The BMD measured at Forearm Radius 33% is 0.603 g/cm2 with a T-score of -3.1. This patient is considered osteoporotic according to [facility]) criteria. Lumbar spine  was not utilized due to previous surgery for spinal stenosis and advanced degenerative changes. The quality of the scan is good. Site Region Measured Measured WHO Young Adult BMD Date       Age      Classification T-score DualFemur Neck Right 10/10/2018 73.0 Osteopenia -2.0 0.753 g/cm2 DualFemur Neck Right 10/06/2017 72.0 Osteopenia -2.3 0.724 g/cm2 DualFemur Neck Right 06/17/2011 65.7 Osteopenia -2.0 0.754 g/cm2 DualFemur Neck Right 07/16/2010 64.7 Osteopenia -1.7 0.795 g/cm2 DualFemur Neck Right 05/23/2009 63.6 Osteopenia -1.9 0.771 g/cm2 DualFemur Total Mean 10/10/2018 73.0 Osteopenia -2.0 0.760 g/cm2 DualFemur Total Mean 10/06/2017 72.0 Osteopenia -1.8 0.780 g/cm2 DualFemur Total Mean 06/17/2011 65.7 Osteopenia -1.5 0.816 g/cm2 DualFemur Total Mean 07/16/2010 64.7 Osteopenia -1.5 0.824 g/cm2 DualFemur Total Mean 05/23/2009 63.6 Osteopenia -1.3 0.847 g/cm2 Right Forearm Radius 33% 10/10/2018 73.0 Osteoporosis -3.1 0.603 g/cm2 Right Forearm Radius 33% 10/06/2017 72.0 Osteoporosis -2.8 0.635 g/cm2 [facility]) criteria for post-menopausal, Caucasian Women: Normal:       T-score at or above -1 SD Osteopenia:   T-score between -1 and -2.5 SD Osteoporosis: T-score at or below -2.5 SD RECOMMENDATIONS: 1. All patients should optimize calcium and vitamin D intake. 2. Consider FDA-approved medical therapies in postmenopausal women and men aged 64 years and older, based on the following: a. A hip or vertebral(clinical or morphometric) fracture b. T-score < -2.5 at the femoral neck or spine after appropriate evaluation to exclude secondary causes c. Low bone mass (T-score between -1.0 and -2.5 at the femoral neck or spine) and a 10-year probability of a hip fracture > 3% or a 10-year probability of a major osteoporosis-related fracture > 20% based on the [place]-adapted WHO algorithm d. Clinician judgment and/or patient preferences may indicate treatment for people with 10-year fracture probabilities above or  below these levels FOLLOW-UP: People with diagnosed cases of osteoporosis or at high risk for fracture should have regular bone mineral density tests. For patients eligible for Medicare, routine testing is allowed once every 2 years. The testing frequency can be increased to one year for patients who have rapidly progressing disease, those who are receiving or discontinuing medical therapy to restore bone mass, or have additional risk factors. I have reviewed this report, and agree with the above findings. [facility] Radiology Electronically Signed   By: Lowella Grip III M.D.   On: 10/10/2018 10:31    ASSESSMENT: Bilateral breast cancer.  PLAN:    1. Bilateral breast cancer: Patient has a history of a pathologic stage IA adenocarcinoma of the left breast which was ER and HER-2  negative, PR 5%. Patient received chemotherapy and XRT for her initial breast cancer.  In May ~2015 she was diagnosed with a second breast cancer in [place] that was reported to be ER/PR positive and HER-2 negative. Patient underwent bilateral mastectomy and therefore did not require adjuvant XRT. She was initiated on anastrozole and will complete 5 years of treatment in May 2020.  Although given patient's history of bilateral breast cancer, will consider extended treatment up to 7 years.  Patient does not require any further mammograms.  Keep follow-up as previously scheduled. 2. Anxiety/insomnia: Continue Xanax, but patient expressed understanding that her primary care physician will have to prescribe this in the future. 3.  Peripheral neuropathy: Chronic and unchanged.  Patient does not wish any intervention at this time. 4.  Osteoporosis: Patient's most recent bone mineral density on October 10, 2018 reported T score of -3.1 which is worse than 1 year prior where the T score was reported -2.8.  Patient does not wish to switch any treatments at this time.  Continue Fosamax, calcium, and vitamin D supplementation. 5.  Left  axillary mass: We will get ultrasound and possible biopsy for further evaluation.   I spent a total of 30 minutes face-to-face with the patient of which greater than 50% of the visit was spent in counseling and coordination of care as detailed above.   Patient expressed understanding and was in agreement with this plan. She also understands that She can call clinic at any time with any questions, concerns, or complaints.    Lloyd Huger, MD   10/21/2018 12:44 PM

## 2018-10-26 ENCOUNTER — Other Ambulatory Visit: Payer: Self-pay | Admitting: Oncology

## 2018-10-26 ENCOUNTER — Telehealth: Payer: Self-pay | Admitting: *Deleted

## 2018-10-26 DIAGNOSIS — R2232 Localized swelling, mass and lump, left upper limb: Secondary | ICD-10-CM

## 2018-10-26 DIAGNOSIS — M5416 Radiculopathy, lumbar region: Secondary | ICD-10-CM | POA: Diagnosis not present

## 2018-10-26 DIAGNOSIS — M6281 Muscle weakness (generalized): Secondary | ICD-10-CM | POA: Diagnosis not present

## 2018-10-26 NOTE — Telephone Encounter (Signed)
I spoke with patient and to Mammography who reports that have received the disc and are expecting ti to be uploaded into our system today and will call patient if everything is in order. Patient informed

## 2018-10-26 NOTE — Telephone Encounter (Signed)
Patient states she was to have an [place] and no one has called her with an appointment for it. Please return her call to discuss why [phone]  Follow-up and Disposition History   10/19/2018 0959 - Johney Maine, RN  Check-out Note:  1 week: schedule for [place] of left breast, keep f/u as planned kry      Active Treatment & Therapy Days for [place], [place] (until 01/24/2019)  [name] does not have any active plans of the following types: ONCOLOGY TREATMENT, ONCOLOGY SUPPORTIVE CARE, ANTIBODY PLAN, ONCOLOGY SUPPORTIVE CARE 2  Recurring Treatments   None  Blood Administration    View:  72 Hours 4 Days Encounter Long term Sort by:  Product Time Expand All  Collapse All    None  Blood Product Administration History   None  Visit Disposition   Check-out Note  1 week: schedule for [place] of left breast, keep f/u as planned [facility]

## 2018-10-26 NOTE — Telephone Encounter (Signed)
Patient cannot be scheduled for her [place] until [place] receives the disk from her previous mammograms. She was instucted to go to [place] to complete the Release of Information so they could obtain then. They also informed her that they would call her once they received the disk to get her scheduled for the [place].

## 2018-10-31 ENCOUNTER — Ambulatory Visit
Admission: RE | Admit: 2018-10-31 | Discharge: 2018-10-31 | Disposition: A | Discharge status: Home / Self Care | Payer: PPO | Source: Ambulatory Visit | Attending: Oncology | Admitting: Oncology

## 2018-10-31 DIAGNOSIS — R2232 Localized swelling, mass and lump, left upper limb: Secondary | ICD-10-CM | POA: Insufficient documentation

## 2018-10-31 DIAGNOSIS — N6489 Other specified disorders of breast: Secondary | ICD-10-CM | POA: Diagnosis not present

## 2018-11-10 DIAGNOSIS — M5416 Radiculopathy, lumbar region: Secondary | ICD-10-CM | POA: Diagnosis not present

## 2018-11-10 DIAGNOSIS — M6281 Muscle weakness (generalized): Secondary | ICD-10-CM | POA: Diagnosis not present

## 2018-11-15 DIAGNOSIS — M545 Low back pain: Secondary | ICD-10-CM | POA: Diagnosis not present

## 2018-11-15 DIAGNOSIS — Z9889 Other specified postprocedural states: Secondary | ICD-10-CM | POA: Diagnosis not present

## 2018-11-15 DIAGNOSIS — W010XXA Fall on same level from slipping, tripping and stumbling without subsequent striking against object, initial encounter: Secondary | ICD-10-CM | POA: Diagnosis not present

## 2018-11-24 DIAGNOSIS — M545 Low back pain: Secondary | ICD-10-CM | POA: Diagnosis not present

## 2018-12-15 ENCOUNTER — Other Ambulatory Visit: Payer: Self-pay | Admitting: *Deleted

## 2018-12-15 DIAGNOSIS — G4700 Insomnia, unspecified: Secondary | ICD-10-CM

## 2018-12-15 MED ORDER — ALPRAZOLAM 1 MG PO TABS: ORAL_TABLET | ORAL | 0 refills | Status: DC

## 2018-12-15 NOTE — Telephone Encounter (Signed)
Per Dr Gary Fleet October office note, patient is to have her PCP refill this from now on.  Patient advised of this and states she was never told this, and that she is no longer seeing Dr Ronnald Ramp.  She has an appointment with new PCP, Dr Ouida Sills early January and asked for just one more 30 day supply and that she will have him to write for it after that. Dr Janese Banks agreed and sent prescription in. Patient informed

## 2018-12-29 DIAGNOSIS — G8929 Other chronic pain: Secondary | ICD-10-CM | POA: Diagnosis not present

## 2018-12-29 DIAGNOSIS — R072 Precordial pain: Secondary | ICD-10-CM | POA: Diagnosis not present

## 2018-12-29 DIAGNOSIS — I10 Essential (primary) hypertension: Secondary | ICD-10-CM | POA: Diagnosis not present

## 2018-12-29 DIAGNOSIS — E782 Mixed hyperlipidemia: Secondary | ICD-10-CM | POA: Diagnosis not present

## 2018-12-29 DIAGNOSIS — I2510 Atherosclerotic heart disease of native coronary artery without angina pectoris: Secondary | ICD-10-CM | POA: Diagnosis not present

## 2018-12-29 DIAGNOSIS — M545 Low back pain: Secondary | ICD-10-CM | POA: Diagnosis not present

## 2019-01-04 DIAGNOSIS — M5416 Radiculopathy, lumbar region: Secondary | ICD-10-CM | POA: Diagnosis not present

## 2019-01-04 DIAGNOSIS — M6281 Muscle weakness (generalized): Secondary | ICD-10-CM | POA: Diagnosis not present

## 2019-01-13 DIAGNOSIS — M5416 Radiculopathy, lumbar region: Secondary | ICD-10-CM | POA: Diagnosis not present

## 2019-01-13 DIAGNOSIS — M6281 Muscle weakness (generalized): Secondary | ICD-10-CM | POA: Diagnosis not present

## 2019-01-20 DIAGNOSIS — M5416 Radiculopathy, lumbar region: Secondary | ICD-10-CM | POA: Diagnosis not present

## 2019-01-20 DIAGNOSIS — M6281 Muscle weakness (generalized): Secondary | ICD-10-CM | POA: Diagnosis not present

## 2019-02-14 DIAGNOSIS — Z0000 Encounter for general adult medical examination without abnormal findings: Secondary | ICD-10-CM | POA: Diagnosis not present

## 2019-02-14 DIAGNOSIS — R7309 Other abnormal glucose: Secondary | ICD-10-CM | POA: Diagnosis not present

## 2019-02-14 DIAGNOSIS — G620 Drug-induced polyneuropathy: Secondary | ICD-10-CM | POA: Diagnosis not present

## 2019-02-14 DIAGNOSIS — I10 Essential (primary) hypertension: Secondary | ICD-10-CM | POA: Diagnosis not present

## 2019-02-14 DIAGNOSIS — Z9013 Acquired absence of bilateral breasts and nipples: Secondary | ICD-10-CM | POA: Insufficient documentation

## 2019-02-14 DIAGNOSIS — I2510 Atherosclerotic heart disease of native coronary artery without angina pectoris: Secondary | ICD-10-CM | POA: Diagnosis not present

## 2019-02-14 DIAGNOSIS — T451X5A Adverse effect of antineoplastic and immunosuppressive drugs, initial encounter: Secondary | ICD-10-CM | POA: Diagnosis not present

## 2019-03-14 ENCOUNTER — Ambulatory Visit: Payer: PPO | Admitting: Oncology

## 2019-03-23 ENCOUNTER — Ambulatory Visit: Payer: PPO | Admitting: Family Medicine

## 2019-04-05 ENCOUNTER — Ambulatory Visit: Payer: PPO

## 2019-04-19 ENCOUNTER — Inpatient Hospital Stay: Payer: PPO | Admitting: Oncology

## 2019-05-16 DIAGNOSIS — G620 Drug-induced polyneuropathy: Secondary | ICD-10-CM | POA: Diagnosis not present

## 2019-05-16 DIAGNOSIS — R7309 Other abnormal glucose: Secondary | ICD-10-CM | POA: Diagnosis not present

## 2019-05-16 DIAGNOSIS — I2510 Atherosclerotic heart disease of native coronary artery without angina pectoris: Secondary | ICD-10-CM | POA: Diagnosis not present

## 2019-05-16 DIAGNOSIS — T451X5A Adverse effect of antineoplastic and immunosuppressive drugs, initial encounter: Secondary | ICD-10-CM | POA: Diagnosis not present

## 2019-05-16 DIAGNOSIS — I10 Essential (primary) hypertension: Secondary | ICD-10-CM | POA: Diagnosis not present

## 2019-05-16 DIAGNOSIS — E782 Mixed hyperlipidemia: Secondary | ICD-10-CM | POA: Diagnosis not present

## 2019-06-16 ENCOUNTER — Other Ambulatory Visit: Payer: Self-pay

## 2019-06-18 NOTE — Progress Notes (Signed)
[facility]  [phone] [phone]  ID: Kelsey Carter OB: 10/28/1946  MR#: [id number]  CSN#:[id number]  Patient Care Team: Anderson, Marshall W, MD as PCP - General (Internal Medicine) Kowalski, Bruce J, MD as Consulting Physician (Cardiology) Finnegan, Timothy J, MD as Consulting Physician (Oncology) Gaines, Thomas C, PA-C as Physician Assistant (Orthopedic Surgery)  CHIEF COMPLAINT: Bilateral breast cancer.  INTERVAL HISTORY: Patient returns to clinic today for routine 6-month evaluation.  She continues to have multiple medical complaints that are all chronic and unchanged in nature.  She complains of persistent daily diarrhea for the last 3 to 4 months with associated fatigue, but has not sought medical attention.  She also reports her stools are occasionally dark.  She is unclear if she is taking anastrozole. She continues to have a persistent peripheral neuropathy, but no other neurologic complaints.  She denies any chest pain, shortness of breath, cough, or hemoptysis.  She denies any nausea, vomiting, or constipation. She has no urinary complaints.  Patient offers no further specific complaints today.  REVIEW OF SYSTEMS:   Review of Systems  Constitutional: Positive for malaise/fatigue. Negative for fever and weight loss.  Respiratory: Negative.  Negative for cough and shortness of breath.   Cardiovascular: Negative.  Negative for chest pain and leg swelling.  Gastrointestinal: Positive for diarrhea. Negative for abdominal pain.  Genitourinary: Negative.   Musculoskeletal: Positive for back pain and joint pain.  Skin: Negative.  Negative for rash.  Neurological: Positive for tingling, sensory change and weakness. Negative for focal weakness.  Psychiatric/Behavioral: The patient is nervous/anxious. The patient does not have insomnia.     As per HPI. Otherwise, a complete review of systems is negative.  PAST MEDICAL HISTORY: Past  Medical History:  Diagnosis Date  . Anemia    vitamin d deficiency  . Anxiety   . Arthritis    back, DDD  . Breast cancer (HCC)    bilateral mastectomies.  different types of cancer in each breast  . Cardiomyopathy due to chemotherapy (HCC) 2011  . Chronic pain 2019  . Complication of anesthesia   . Coronary artery disease   . GERD (gastroesophageal reflux disease)   . Hyperlipidemia   . Hypertension   . Lumbar herniated disc   . Peripheral vascular disease (HCC)    neuropathies in extremities due to chemotherapy  . Personal history of chemotherapy   . Personal history of radiation therapy   . PONV (postoperative nausea and vomiting)     PAST SURGICAL HISTORY: Past Surgical History:  Procedure Laterality Date  . APPENDECTOMY    . BREAST IMPLANT REMOVAL Bilateral 2016   d/t infection. nothing replaced  . CARDIAC CATHETERIZATION  2018   no blockages. pt thought it was an MI, but it wasn't  . CESAREAN SECTION     x 3  . EYE SURGERY Bilateral 2013   cataract extractions with iol. cornea replacement also  . LUMBAR LAMINECTOMY/DECOMPRESSION MICRODISCECTOMY N/A 06/29/2018   Procedure: LUMBAR LAMINECTOMY/DECOMPRESSION MICRODISCECTOMY 1 LEVEL-L4-5, L5-S1 LATERAL DISCECTOMY;  Surgeon: Yarbrough, Chester, MD;  Location: [facility] ORS;  Service: Neurosurgery;  Laterality: N/A;  . MASTECTOMY Bilateral 2011, 2016   5 diff procedures. reconstruction saline removed d/t infx  . nsvd     x 1  . PLACEMENT OF BREAST IMPLANTS Bilateral 2016  . PORT-A-CATH REMOVAL  2016  . TONSILLECTOMY      FAMILY HISTORY: Family History  Problem Relation Age of Onset  . Hypertension Mother   . Cervical   cancer Mother   . Renal Disease Mother   . Cancer Father   . Breast cancer Paternal Aunt   . Tuberculosis Maternal Grandfather   . Breast cancer Paternal Grandmother     ADVANCED DIRECTIVES (Y/N):  N  HEALTH MAINTENANCE: Social History   Tobacco Use  . Smoking status: Never Smoker  . Smokeless  tobacco: Never Used  . Tobacco comment: smoking cessation materials not required  Substance Use Topics  . Alcohol use: No  . Drug use: No     Colonoscopy:  PAP:  Bone density:  Lipid panel:  Allergies  Allergen Reactions  . Hydrocodone-Acetaminophen Nausea And Vomiting    Severe vomitting  . Ciprofloxacin Other (See Comments)    Joint pain   . Gabapentin Other (See Comments)    Eyes blurry; vision decreased, dizziness     Current Outpatient Medications  Medication Sig Dispense Refill  . alendronate (FOSAMAX) 70 MG tablet Take 1 tablet (70 mg total) by mouth once a week. 4 tablet 6  . ALPRAZolam (XANAX) 1 MG tablet TAKE 1 TABLET(1 MG) BY MOUTH AT BEDTIME AS NEEDED FOR ANXIETY 30 tablet 0  . Calcium Citrate-Vitamin D (CALCIUM + D PO) Take 1 tablet by mouth every Wednesday.    . Cholecalciferol (VITAMIN D) 2000 units CAPS Take 1 capsule (2,000 Units total) by mouth daily. 30 capsule   . esomeprazole (NEXIUM) 20 MG capsule Take 20 mg by mouth daily as needed (heartburn).    . Magnesium 250 MG TABS Take 250 mg by mouth daily.    . metoprolol succinate (TOPROL-XL) 50 MG 24 hr tablet Take 50 mg by mouth daily.     . naproxen sodium (ALEVE) 220 MG tablet Take 220 mg by mouth daily as needed.    . telmisartan (MICARDIS) 80 MG tablet Take 1 tablet (80 mg total) by mouth daily. 90 tablet 3   No current facility-administered medications for this visit.     OBJECTIVE: Vitals:   06/19/19 1125  BP: (!) 169/101  Pulse: 84  Resp: 18  Temp: 97.8 F (36.6 C)     Body mass index is 29.7 kg/m.    ECOG FS:0 - Asymptomatic  General: Well-developed, well-nourished, no acute distress. Eyes: Pink conjunctiva, anicteric sclera. HEENT: Normocephalic, moist mucous membranes. Breast: Bilateral mastectomy. Lungs: Clear to auscultation bilaterally. Heart: Regular rate and rhythm. No rubs, murmurs, or gallops. Abdomen: Soft, nontender, nondistended. No organomegaly noted, normoactive bowel  sounds. Musculoskeletal: No edema, cyanosis, or clubbing. Neuro: Alert, answering all questions appropriately. Cranial nerves grossly intact. Skin: No rashes or petechiae noted. Psych: Normal affect.  LAB RESULTS:  Lab Results  Component Value Date   NA 139 06/27/2018   K 4.1 06/27/2018   CL 104 06/27/2018   CO2 25 06/27/2018   GLUCOSE 111 (H) 06/27/2018   BUN 13 06/27/2018   CREATININE 0.91 06/27/2018   CALCIUM 9.9 06/27/2018   PROT 7.7 03/22/2018   ALBUMIN 4.4 03/22/2018   AST 25 03/22/2018   ALT 31 03/22/2018   ALKPHOS 79 03/22/2018   BILITOT 0.9 03/22/2018   GFRNONAA >60 06/27/2018   GFRAA >60 06/27/2018    Lab Results  Component Value Date   WBC 6.2 06/19/2019   NEUTROABS 3.1 06/19/2019   HGB 11.1 (L) 06/19/2019   HCT 34.3 (L) 06/19/2019   MCV 96.6 06/19/2019   PLT 263 06/19/2019     STUDIES: No results found.  ASSESSMENT: Bilateral breast cancer.  PLAN:    1. Bilateral breast cancer:   Patient has a history of a pathologic stage IA adenocarcinoma of the left breast which was ER and HER-2 negative, PR 5%. Patient received chemotherapy and XRT for her initial breast cancer.  In May ~2015 she was diagnosed with a second breast cancer in [place] that was reported to be ER/PR positive and HER-2 negative. Patient underwent bilateral mastectomy and therefore did not require adjuvant XRT.  Patient is unclear if she is still taking anastrozole, but she was scheduled to complete 5 years of treatment in May 2020.  She does not require any further mammograms.  Patient repeatedly referred to today's visit as her last clinic appointment, therefore no further follow-up was scheduled.   2. Anxiety/insomnia: Continue treatment and evaluation per primary care. 3.  Peripheral neuropathy: Chronic and unchanged.  4.  Osteoporosis: Patient's most recent bone mineral density on October 10, 2018 reported T score of -3.1 which is worse than 1 year prior where the T score was reported  -2.8.  Patient does not wish to switch any treatments at this time.  Continue Fosamax, calcium, and vitamin D supplementation. 5.  Diarrhea: I recommended patient follow-up with her primary care physician and possible GI. 6.  Anemia: Patient has a hemoglobin 11.1 today which is decreased from normal over 1 year ago.  Iron stores are pending at time of dictation.  Have recommended follow-up with GI as above.  I spent a total of 30 minutes face-to-face with the patient of which greater than 50% of the visit was spent in counseling and coordination of care as detailed above.  Patient expressed understanding and was in agreement with this plan. She also understands that She can call clinic at any time with any questions, concerns, or complaints.    Timothy J Finnegan, MD   06/19/2019 12:30 PM     

## 2019-06-19 ENCOUNTER — Inpatient Hospital Stay: Payer: PPO

## 2019-06-19 ENCOUNTER — Other Ambulatory Visit: Payer: Self-pay

## 2019-06-19 ENCOUNTER — Inpatient Hospital Stay: Payer: PPO | Attending: Oncology | Admitting: Oncology

## 2019-06-19 ENCOUNTER — Encounter: Payer: Self-pay | Admitting: Oncology

## 2019-06-19 VITALS — BP 169/101 | HR 84 | Temp 97.8°F | Resp 18 | Wt 173.0 lb

## 2019-06-19 DIAGNOSIS — Z170 Estrogen receptor positive status [ER+]: Secondary | ICD-10-CM | POA: Insufficient documentation

## 2019-06-19 DIAGNOSIS — E559 Vitamin D deficiency, unspecified: Secondary | ICD-10-CM | POA: Insufficient documentation

## 2019-06-19 DIAGNOSIS — E785 Hyperlipidemia, unspecified: Secondary | ICD-10-CM | POA: Insufficient documentation

## 2019-06-19 DIAGNOSIS — D649 Anemia, unspecified: Secondary | ICD-10-CM

## 2019-06-19 DIAGNOSIS — Z79899 Other long term (current) drug therapy: Secondary | ICD-10-CM | POA: Insufficient documentation

## 2019-06-19 DIAGNOSIS — Z803 Family history of malignant neoplasm of breast: Secondary | ICD-10-CM | POA: Diagnosis not present

## 2019-06-19 DIAGNOSIS — R197 Diarrhea, unspecified: Secondary | ICD-10-CM

## 2019-06-19 DIAGNOSIS — G629 Polyneuropathy, unspecified: Secondary | ICD-10-CM | POA: Diagnosis not present

## 2019-06-19 DIAGNOSIS — C50911 Malignant neoplasm of unspecified site of right female breast: Secondary | ICD-10-CM

## 2019-06-19 DIAGNOSIS — I10 Essential (primary) hypertension: Secondary | ICD-10-CM | POA: Insufficient documentation

## 2019-06-19 DIAGNOSIS — C50912 Malignant neoplasm of unspecified site of left female breast: Secondary | ICD-10-CM | POA: Insufficient documentation

## 2019-06-19 DIAGNOSIS — F419 Anxiety disorder, unspecified: Secondary | ICD-10-CM | POA: Diagnosis not present

## 2019-06-19 LAB — CBC WITH DIFFERENTIAL/PLATELET
Abs Immature Granulocytes: 0.03 10*3/uL (ref 0.00–0.07)
Basophils Absolute: 0 10*3/uL (ref 0.0–0.1)
Basophils Relative: 1 %
Eosinophils Absolute: 0.2 10*3/uL (ref 0.0–0.5)
Eosinophils Relative: 4 %
HCT: 34.3 % — ABNORMAL LOW (ref 36.0–46.0)
Hemoglobin: 11.1 g/dL — ABNORMAL LOW (ref 12.0–15.0)
Immature Granulocytes: 1 %
Lymphocytes Relative: 35 %
Lymphs Abs: 2.1 10*3/uL (ref 0.7–4.0)
MCH: 31.3 pg (ref 26.0–34.0)
MCHC: 32.4 g/dL (ref 30.0–36.0)
MCV: 96.6 fL (ref 80.0–100.0)
Monocytes Absolute: 0.7 10*3/uL (ref 0.1–1.0)
Monocytes Relative: 11 %
Neutro Abs: 3.1 10*3/uL (ref 1.7–7.7)
Neutrophils Relative %: 48 %
Platelets: 263 10*3/uL (ref 150–400)
RBC: 3.55 MIL/uL — ABNORMAL LOW (ref 3.87–5.11)
RDW: 13.3 % (ref 11.5–15.5)
WBC: 6.2 10*3/uL (ref 4.0–10.5)
nRBC: 0.3 % — ABNORMAL HIGH (ref 0.0–0.2)

## 2019-06-19 LAB — IRON AND TIBC
Iron: 94 ug/dL (ref 28–170)
Saturation Ratios: 29 % (ref 10.4–31.8)
TIBC: 319 ug/dL (ref 250–450)
UIBC: 225 ug/dL

## 2019-06-19 LAB — FERRITIN: Ferritin: 216 ng/mL (ref 11–307)

## 2019-06-19 MED ORDER — ALENDRONATE SODIUM 70 MG PO TABS: 70.0000 mg | ORAL_TABLET | ORAL | 6 refills | Status: DC

## 2019-06-19 NOTE — Progress Notes (Signed)
Patient reports fatigue and reports diarrhea.

## 2019-06-20 ENCOUNTER — Telehealth: Payer: Self-pay | Admitting: *Deleted

## 2019-06-20 NOTE — Telephone Encounter (Signed)
Called patient to discuss lab results from yesterdays visit. Per Dr. Grayland Ormond patient has mild anemia with normal iron stores, does not explain her symptoms of fatigue and patient advised to f/u with pcp and GI as discussed at visit. Patient verbalized understanding and is in agreement with plan.

## 2019-07-10 DIAGNOSIS — E782 Mixed hyperlipidemia: Secondary | ICD-10-CM | POA: Diagnosis not present

## 2019-07-10 DIAGNOSIS — I10 Essential (primary) hypertension: Secondary | ICD-10-CM | POA: Diagnosis not present

## 2019-07-10 DIAGNOSIS — R0789 Other chest pain: Secondary | ICD-10-CM | POA: Diagnosis not present

## 2019-07-10 DIAGNOSIS — I2510 Atherosclerotic heart disease of native coronary artery without angina pectoris: Secondary | ICD-10-CM | POA: Diagnosis not present

## 2019-07-11 IMAGING — MR MR LUMBAR SPINE W/O CM
4 of 5 series · 14 of 48 positions shown · non-contrast
Comparison: Lumbar spine CT 03/16/2010

CLINICAL DATA: Degenerative disc disease, lumbar. Lumbar
radiculopathy on the left.

EXAM:
MRI LUMBAR SPINE WITHOUT CONTRAST
TECHNIQUE: Multiplanar, multisequence MR imaging of the lumbar spine was
performed. No intravenous contrast was administered.

[Series 2: T2 · sagittal · 4.0mm · 0.44mm/px · 5 of 16 slices shown (1 of 2)]
[im 1/16]
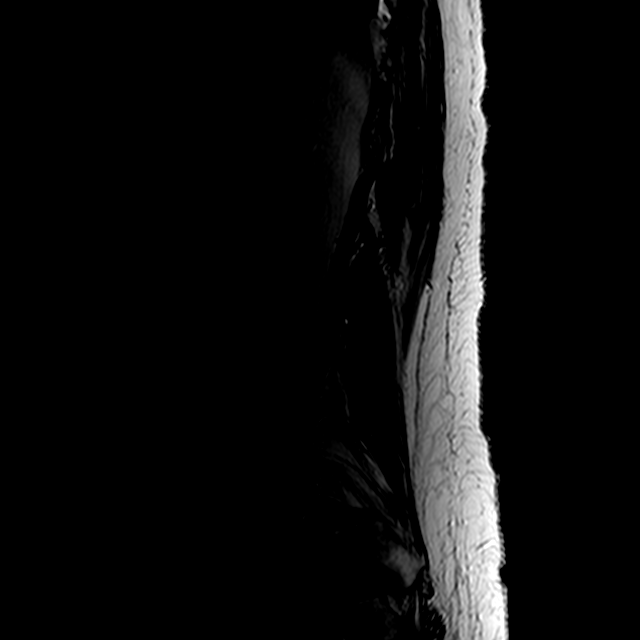
[im 4/16]
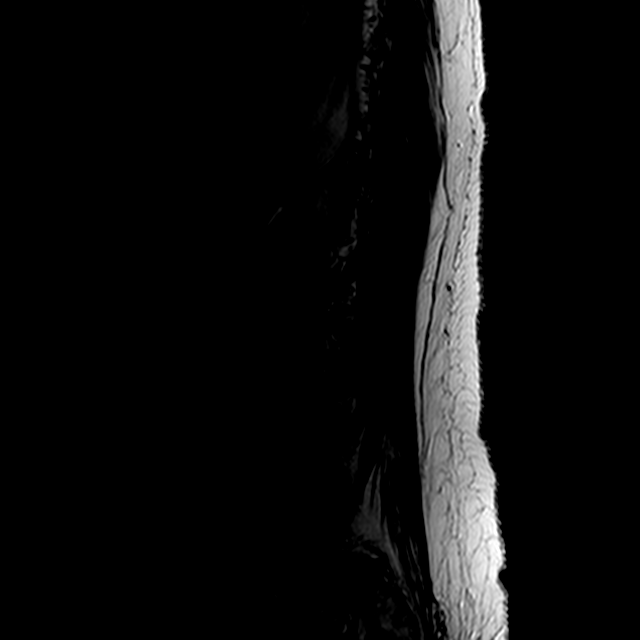
[im 7/16]
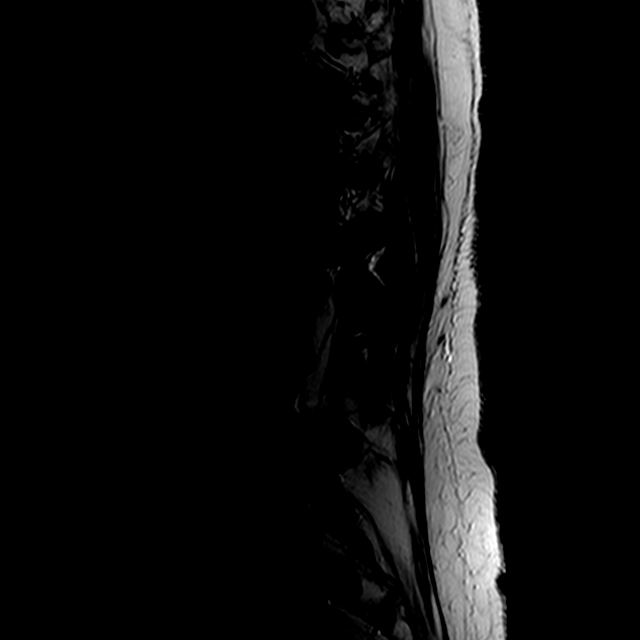
[im 10/16]
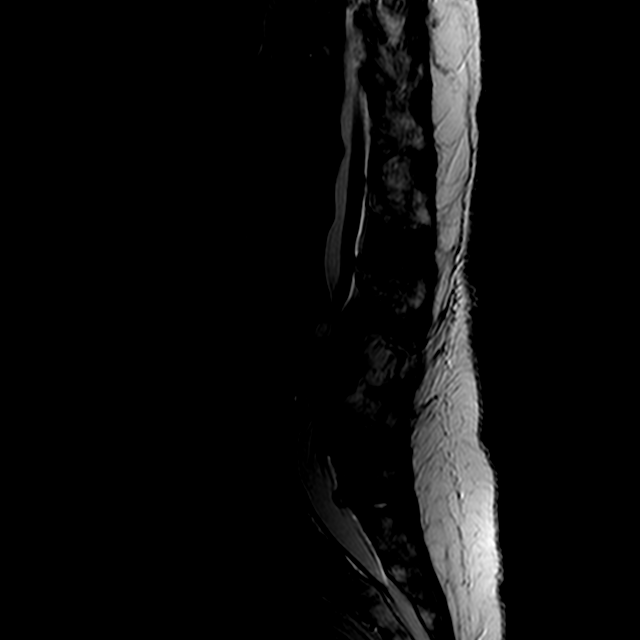
[im 16/16]
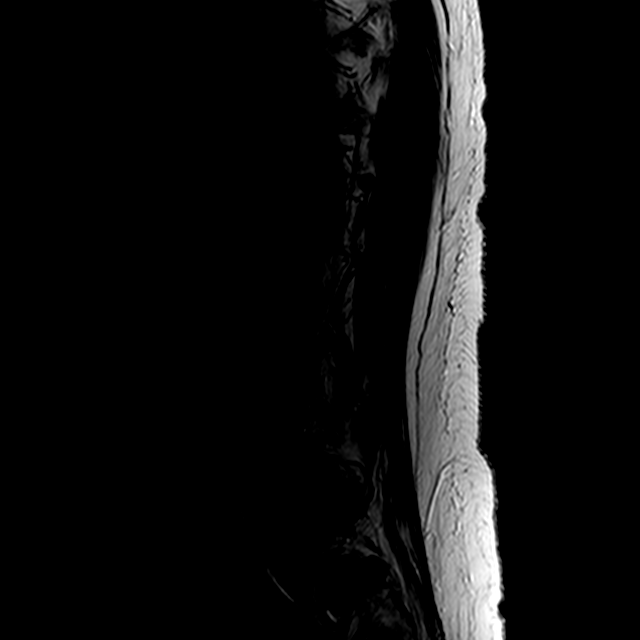

[Series 3: T1 · sagittal · 4.0mm · 0.44mm/px · 3 of 16 slices shown (1 of 2)]
[im 4/16]
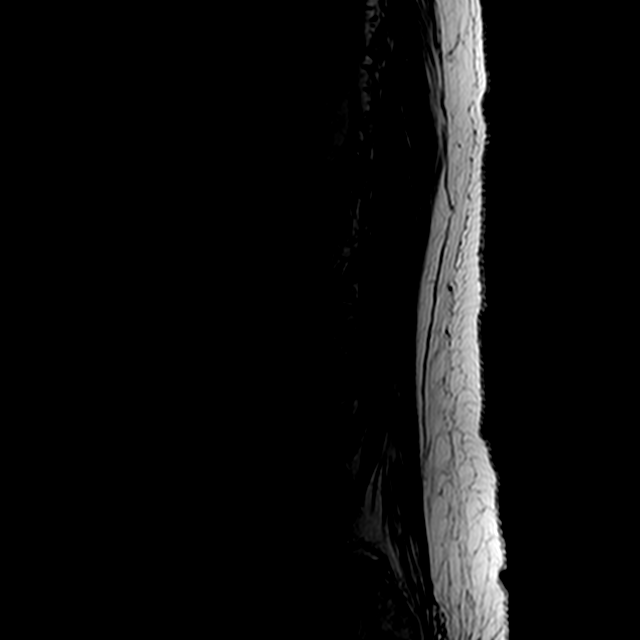
[im 10/16]
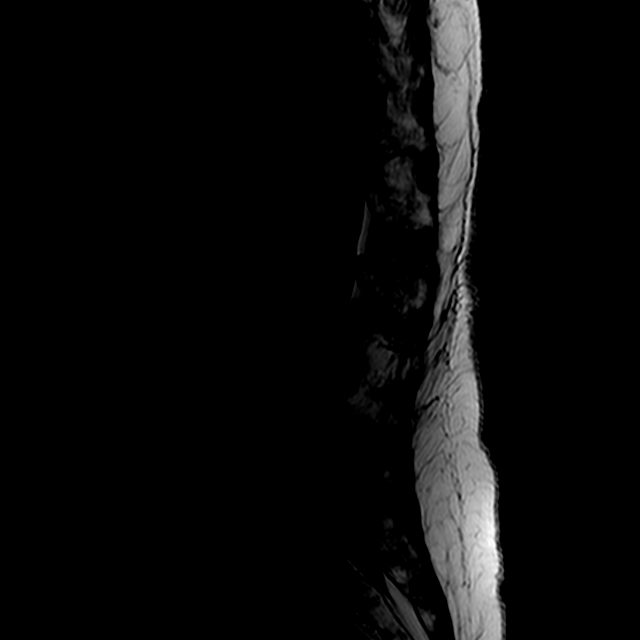
[im 16/16]
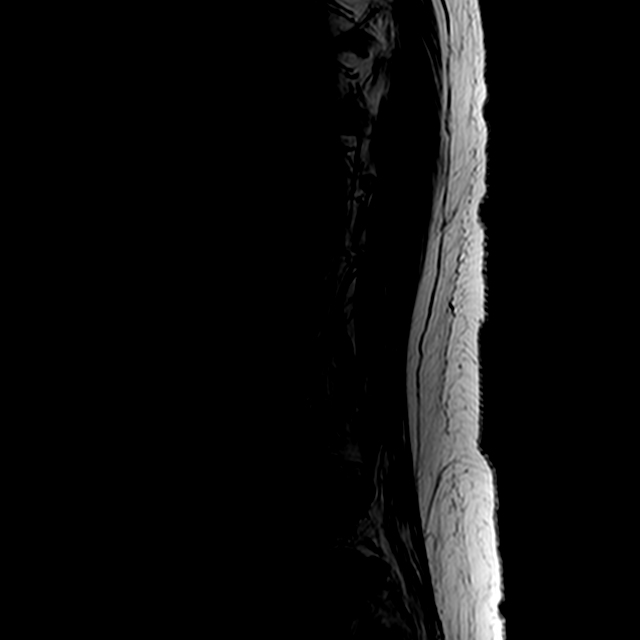

[Series 5: T2 · axial · 4.0mm · 0.39mm/px · z∈[-26,+127]mm · 3 of 39 slices shown (2 of 2)]
[im 6/39]
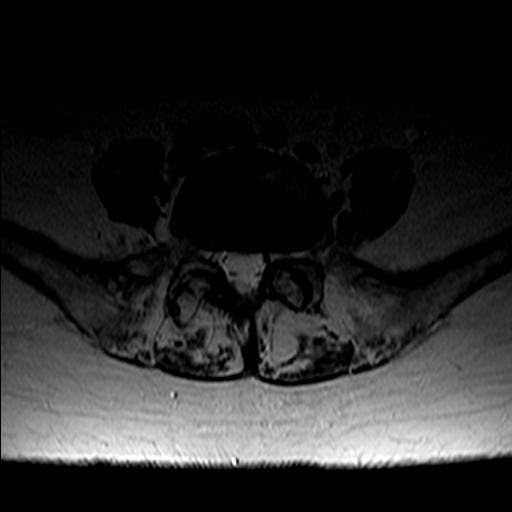
[im 20/39]
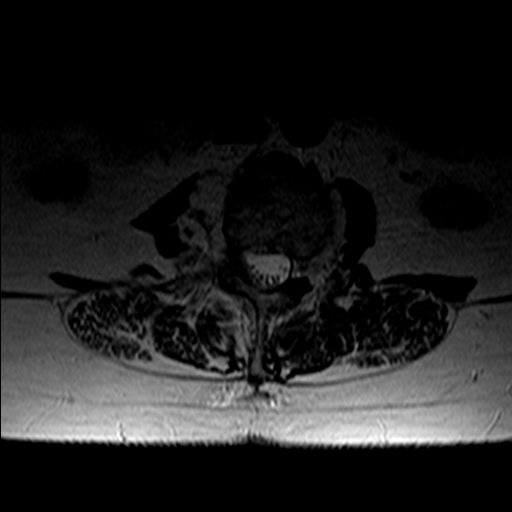
[im 33/39]
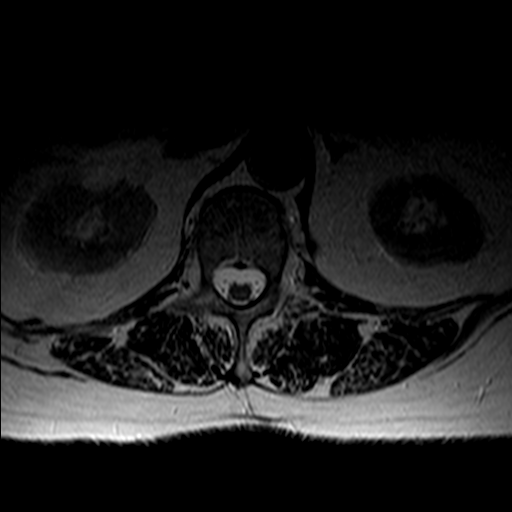

[Series 6: T1 · axial · 4.0mm · 0.39mm/px · z∈[-26,+127]mm · 3 of 39 slices shown (2 of 2)]
[im 6/39]
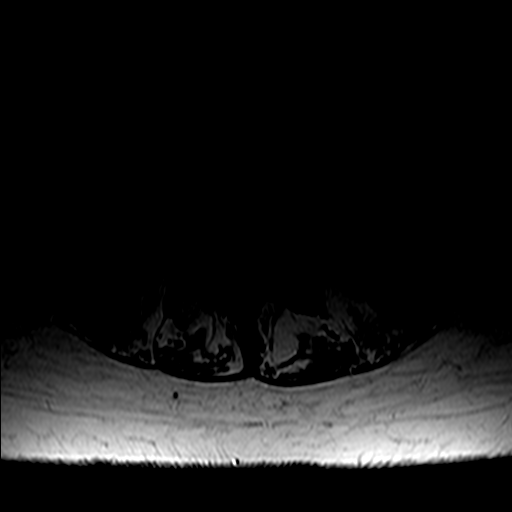
[im 20/39]
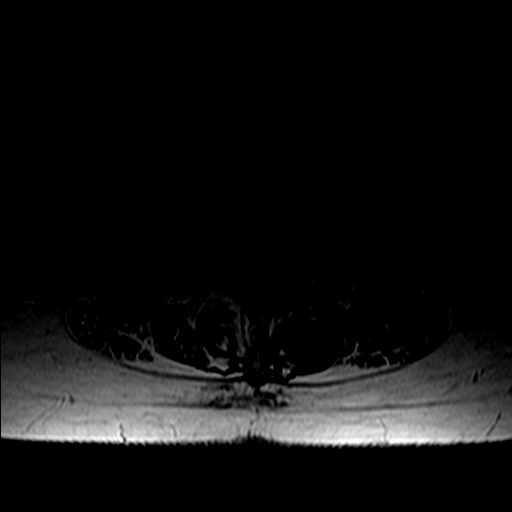
[im 33/39]
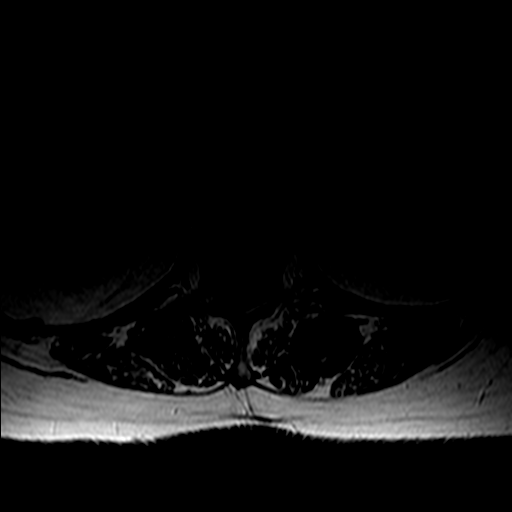

[14 of 48 positions shown; findings below may reference images not displayed]

FINDINGS: Segmentation: 5 lumbar type vertebral bodies on 02/17/2018
radiography.

Alignment:  Levoscoliosis

Vertebrae:  No fracture, evidence of discitis, or bone lesion.

Conus medullaris and cauda equina: Conus extends to the L2 level.
Conus and cauda equina appear normal.

Paraspinal and other soft tissues: Bilateral renal cystic
intensities.

Disc levels:

T12- L1: Small central and left paracentral inferiorly pointing disc
protrusions without visible impingement

L1-L2: Tiny central upward pointing disc protrusion.  No impingement

L2-L3: Disc narrowing and bulging with right inferior pointing disc
protrusion impinging on the descending right L3 nerve root. Disc
bulging causes mild flattening of the ventral thecal sac. Patent
foramina

L3-L4: Disc degeneration with asymmetric right-sided collapse. There
is bulging and endplate spurring. Asymmetric right subarticular
recess narrowing from disc protrusion that could affect the right L4
nerve root. Moderate right foraminal narrowing.

L4-L5: Disc narrowing and bulging. Right foraminal protrusion upward
pointing and compressing the right L4 nerve root against the
pedicle. The facets are degenerated and spurred. Left foraminal
narrowing is moderate.

L5-S1:Disc narrowing and bulging with left foraminal protrusion
compressing the left L5 nerve root. Degenerative facet spurring.
Degenerative ganglion from the ligamentum flavum measuring 7 mm, see
sagittal T2 weighted imaging. No associated impingement
IMPRESSION: 1. Diffuse degenerative disease with levoscoliosis as described
above.
2. On the symptomatic left side impingement is most notable at L5-S1
where there is a foraminal protrusion compressing the left L5 nerve
root.
3. L4-5 right foraminal protrusion compressing the right L4 nerve
root.
4. L3-4 right paracentral disc protrusion impinging on the
descending right L4 nerve root.
5. L2-3 right paracentral disc protrusion impinging on the right L3
nerve root.

## 2019-07-19 DIAGNOSIS — I25118 Atherosclerotic heart disease of native coronary artery with other forms of angina pectoris: Secondary | ICD-10-CM | POA: Diagnosis not present

## 2019-07-19 DIAGNOSIS — Z01818 Encounter for other preprocedural examination: Secondary | ICD-10-CM | POA: Diagnosis not present

## 2019-07-19 DIAGNOSIS — R0789 Other chest pain: Secondary | ICD-10-CM | POA: Diagnosis not present

## 2019-07-19 DIAGNOSIS — I10 Essential (primary) hypertension: Secondary | ICD-10-CM | POA: Diagnosis not present

## 2019-07-19 DIAGNOSIS — E782 Mixed hyperlipidemia: Secondary | ICD-10-CM | POA: Diagnosis not present

## 2019-07-20 DIAGNOSIS — I209 Angina pectoris, unspecified: Secondary | ICD-10-CM | POA: Diagnosis present

## 2019-07-21 ENCOUNTER — Other Ambulatory Visit: Payer: Self-pay

## 2019-07-21 ENCOUNTER — Encounter: Payer: Self-pay | Admitting: Emergency Medicine

## 2019-07-21 ENCOUNTER — Emergency Department: Payer: PPO

## 2019-07-21 ENCOUNTER — Emergency Department
Admission: EM | Admit: 2019-07-21 | Discharge: 2019-07-21 | Disposition: A | Discharge status: Home / Self Care | Payer: PPO | Attending: Emergency Medicine | Admitting: Emergency Medicine

## 2019-07-21 DIAGNOSIS — I2510 Atherosclerotic heart disease of native coronary artery without angina pectoris: Secondary | ICD-10-CM | POA: Insufficient documentation

## 2019-07-21 DIAGNOSIS — R0789 Other chest pain: Secondary | ICD-10-CM | POA: Diagnosis not present

## 2019-07-21 DIAGNOSIS — Z79899 Other long term (current) drug therapy: Secondary | ICD-10-CM | POA: Diagnosis not present

## 2019-07-21 DIAGNOSIS — Z20828 Contact with and (suspected) exposure to other viral communicable diseases: Secondary | ICD-10-CM | POA: Diagnosis not present

## 2019-07-21 DIAGNOSIS — I10 Essential (primary) hypertension: Secondary | ICD-10-CM | POA: Insufficient documentation

## 2019-07-21 DIAGNOSIS — Z853 Personal history of malignant neoplasm of breast: Secondary | ICD-10-CM | POA: Diagnosis not present

## 2019-07-21 DIAGNOSIS — R079 Chest pain, unspecified: Secondary | ICD-10-CM | POA: Diagnosis not present

## 2019-07-21 LAB — CBC
HCT: 35.4 % — ABNORMAL LOW (ref 36.0–46.0)
Hemoglobin: 11.7 g/dL — ABNORMAL LOW (ref 12.0–15.0)
MCH: 31.9 pg (ref 26.0–34.0)
MCHC: 33.1 g/dL (ref 30.0–36.0)
MCV: 96.5 fL (ref 80.0–100.0)
Platelets: 270 10*3/uL (ref 150–400)
RBC: 3.67 MIL/uL — ABNORMAL LOW (ref 3.87–5.11)
RDW: 13 % (ref 11.5–15.5)
WBC: 7.3 10*3/uL (ref 4.0–10.5)
nRBC: 0 % (ref 0.0–0.2)

## 2019-07-21 LAB — BASIC METABOLIC PANEL
Anion gap: 10 (ref 5–15)
BUN: 15 mg/dL (ref 8–23)
CO2: 24 mmol/L (ref 22–32)
Calcium: 9.8 mg/dL (ref 8.9–10.3)
Chloride: 107 mmol/L (ref 98–111)
Creatinine, Ser: 0.9 mg/dL (ref 0.44–1.00)
GFR calc Af Amer: >60 mL/min (ref >60)
GFR calc non Af Amer: >60 mL/min (ref >60)
Glucose, Bld: 123 mg/dL — ABNORMAL HIGH (ref 70–99)
Potassium: 4.3 mmol/L (ref 3.5–5.1)
Sodium: 141 mmol/L (ref 135–145)

## 2019-07-21 LAB — TROPONIN I (HIGH SENSITIVITY)
Troponin I (High Sensitivity): 8 ng/L (ref <18)
Troponin I (High Sensitivity): 9 ng/L (ref <18)

## 2019-07-21 MED ORDER — SODIUM CHLORIDE 0.9% FLUSH: 3.0000 mL | Freq: Once | INTRAVENOUS | Status: DC

## 2019-07-21 MED ORDER — KETOROLAC TROMETHAMINE 30 MG/ML IJ SOLN
15.0000 mg | Freq: Once | INTRAMUSCULAR | Status: AC
  Administered 2019-07-21: 13:00:00 15 mg via INTRAVENOUS
  Filled 2019-07-21: qty 1

## 2019-07-21 NOTE — ED Triage Notes (Signed)
Pt to ED via POV c/o chest pain that is located on under the left breast. Pt states that the pain radiates into the left shoulder and she is having nausea. Pt states that the pain comes about every 10 minutes. Pt states that she is scheduled for a cardiac cath on Thursday with Dr. Nehemiah Massed. Pt describes the pain as a "mushy" pain. Pt states that she took an extra Metoprolol and Aspirin this morning when the pain started around 0430. Pt is in NAD at this time.

## 2019-07-21 NOTE — ED Notes (Signed)
Pt lying in bed speaking with this RN in NAD, A&Ox4. PT reports she had an ECHO and stress test 07/29 and "failed these" so Dr. Nehemiah Massed advised her to come to the ED.

## 2019-07-21 NOTE — ED Notes (Signed)
Pt given warm blankets.

## 2019-07-21 NOTE — Discharge Instructions (Addendum)
You had a COVID-19 test sent today in preparation for your procedure next week.  Follow-up with Dr. Drusilla Kanner for the catheterization next week as planned.  Return to the ER immediately for new, worsening, or persistent chest pain, difficulty breathing, weakness or lightheadedness, or any other new or worsening symptoms that concern you.

## 2019-07-21 NOTE — ED Provider Notes (Signed)
[facility] Emergency Department Provider Note ____________________________________________   First MD Initiated Contact with Patient 07/21/19 1122     (approximate)  I have reviewed the triage vital signs and the nursing notes.   HISTORY  Chief Complaint Chest Pain    HPI Kelsey Carter is a 73 y.o. female with PMH as noted below including CAD who presents with chest pain, left-sided mainly under her breast, worse with certain positions and to touch, and radiating to the left arm.  It is not exertional.  It started early this morning around 430 and has persisted since then.  She states that she previously had sharp chest pain but this is more muted.  The patient has had some on and off chest pain recently and failed a stress test, so she is scheduled for cardiac catheterization next week.  Past Medical History:  Diagnosis Date  . Anemia    vitamin d deficiency  . Anxiety   . Arthritis    back, DDD  . Breast cancer ([place])    bilateral mastectomies.  different types of cancer in each breast  . Cardiomyopathy due to chemotherapy ([place]) 2011  . Chronic pain 2019  . Complication of anesthesia   . Coronary artery disease   . GERD (gastroesophageal reflux disease)   . Hyperlipidemia   . Hypertension   . Lumbar herniated disc   . Peripheral vascular disease (HCC)    neuropathies in extremities due to chemotherapy  . Personal history of chemotherapy   . Personal history of radiation therapy   . PONV (postoperative nausea and vomiting)     Patient Active Problem List   Diagnosis Date Noted  . Angina pectoris ([place]) 07/20/2019  . Anxiety 03/23/2018  . Prediabetes 03/23/2018  . DDD (degenerative disc disease), lumbar 03/02/2018  . Chronic low back pain (Primary Area of Pain) (Bilateral) (L>R) with left-sided sciatica 02/15/2018  . Chronic lower extremity pain (Secondary Area of Pain) (Left) 02/15/2018  . Chronic pain syndrome 02/15/2018  . Long term  prescription benzodiazepine use 02/15/2018  . Disorder of skeletal system 02/15/2018  . Problems influencing health status 02/15/2018  . Chronic sacroiliac joint pain 02/15/2018  . Benign essential HTN 10/05/2017  . Coronary artery disease involving native coronary artery of native heart 10/05/2017  . Hyperlipidemia, mixed 10/05/2017  . Elevated TSH 09/21/2017  . Cardiomyopathy due to chemotherapy ([place]) 08/24/2017  . Insomnia 08/24/2017  . Peripheral neuropathy due to chemotherapy ([place]) 08/24/2017  . Bilateral breast cancer ([place]) 08/24/2017  . Onychomycosis 08/24/2017  . Overweight (BMI 25.0-29.9) 08/24/2017  . Vitamin D deficiency 08/24/2017  . Hyperlipidemia 08/24/2017    Past Surgical History:  Procedure Laterality Date  . APPENDECTOMY    . BREAST IMPLANT REMOVAL Bilateral 2016   d/t infection. nothing replaced  . CARDIAC CATHETERIZATION  2018   no blockages. pt thought it was an MI, but it wasn't  . CESAREAN SECTION     x 3  . EYE SURGERY Bilateral 2013   cataract extractions with iol. cornea replacement also  . LUMBAR LAMINECTOMY/DECOMPRESSION MICRODISCECTOMY N/A 06/29/2018   Procedure: LUMBAR LAMINECTOMY/DECOMPRESSION MICRODISCECTOMY 1 LEVEL-L4-5, L5-S1 LATERAL DISCECTOMY;  Surgeon: Meade Maw, MD;  Location: [facility] ORS;  Service: Neurosurgery;  Laterality: N/A;  . MASTECTOMY Bilateral 2011, 2016   5 diff procedures. reconstruction saline removed d/t infx  . nsvd     x 1  . PLACEMENT OF BREAST IMPLANTS Bilateral 2016  . PORT-A-CATH REMOVAL  2016  . TONSILLECTOMY  Prior to Admission medications   Medication Sig Start Date End Date Taking? Authorizing Provider  alendronate (FOSAMAX) 70 MG tablet Take 1 tablet (70 mg total) by mouth once a week. 06/19/19   Lloyd Huger, MD  ALPRAZolam Duanne Moron) 1 MG tablet TAKE 1 TABLET(1 MG) BY MOUTH AT BEDTIME AS NEEDED FOR ANXIETY 12/15/18   Sindy Guadeloupe, MD  Calcium Citrate-Vitamin D (CALCIUM + D PO) Take 1 tablet by  mouth every Wednesday.    [provider]  Cholecalciferol (VITAMIN D) 2000 units CAPS Take 1 capsule (2,000 Units total) by mouth daily. 08/25/17   Plonk, Gwyndolyn Saxon, MD  esomeprazole (NEXIUM) 20 MG capsule Take 20 mg by mouth daily as needed (heartburn).    [provider]  Magnesium 250 MG TABS Take 250 mg by mouth daily.    [provider]  metoprolol succinate (TOPROL-XL) 50 MG 24 hr tablet Take 50 mg by mouth daily.  03/15/18   [provider]  naproxen sodium (ALEVE) 220 MG tablet Take 220 mg by mouth daily as needed.    [provider]  telmisartan (MICARDIS) 80 MG tablet Take 1 tablet (80 mg total) by mouth daily. 03/22/18   Plonk, Gwyndolyn Saxon, MD    Allergies Hydrocodone-acetaminophen, Ciprofloxacin, and Gabapentin  Family History  Problem Relation Age of Onset  . Hypertension Mother   . Cervical cancer Mother   . Renal Disease Mother   . Cancer Father   . Breast cancer Paternal Aunt   . Tuberculosis Maternal Grandfather   . Breast cancer Paternal Grandmother     Social History Social History   Tobacco Use  . Smoking status: Never Smoker  . Smokeless tobacco: Never Used  . Tobacco comment: smoking cessation materials not required  Substance Use Topics  . Alcohol use: No  . Drug use: No    Review of Systems  Constitutional: No fever. Eyes: No visual changes. ENT: No sore throat. Cardiovascular: Positive for chest pain. Respiratory: Denies shortness of breath. Gastrointestinal: No vomiting or diarrhea.  Genitourinary: Negative for flank pain.  Musculoskeletal: Negative for back pain. Skin: Negative for rash. Neurological: Negative for headache.   ____________________________________________   PHYSICAL EXAM:  VITAL SIGNS: ED Triage Vitals  Enc Vitals Group     BP 07/21/19 0930 (!) 165/80     Pulse Rate 07/21/19 0930 74     Resp 07/21/19 1049 16     Temp 07/21/19 0930 98.2 F (36.8 C)     Temp Source 07/21/19 0930  Oral     SpO2 07/21/19 0930 98 %     Weight 07/21/19 0930 171 lb (77.6 kg)     Height 07/21/19 0930 5\' 4"  (1.626 m)     Head Circumference --      Peak Flow --      Pain Score 07/21/19 1049 7     Pain Loc --      Pain Edu? --      Excl. in [place]? --     Constitutional: Alert and oriented. Well appearing and in no acute distress. Eyes: Conjunctivae are normal.  Head: Atraumatic. Nose: No congestion/rhinnorhea. Mouth/Throat: Mucous membranes are moist.   Neck: Normal range of motion.  Cardiovascular: Normal rate, regular rhythm. Good peripheral circulation.  Left chest wall reproducible tenderness. Respiratory: Normal respiratory effort.  No retractions.  Gastrointestinal: No distention.  Musculoskeletal: No lower extremity edema.  No calf or popliteal swelling or tenderness.  Extremities warm and well perfused.  Neurologic:  Normal speech  and language. No gross focal neurologic deficits are appreciated.  Skin:  Skin is warm and dry. No rash noted. Psychiatric: Mood and affect are normal. Speech and behavior are normal.  ____________________________________________   LABS (all labs ordered are listed, but only abnormal results are displayed)  Labs Reviewed  BASIC METABOLIC PANEL - Abnormal; Notable for the following components:      Result Value   Glucose, Bld 123 (*)    All other components within normal limits  CBC - Abnormal; Notable for the following components:   RBC 3.67 (*)    Hemoglobin 11.7 (*)    HCT 35.4 (*)    All other components within normal limits  NOVEL CORONAVIRUS, NAA (HOSPITAL ORDER, SEND-OUT TO REF LAB)  TROPONIN I (HIGH SENSITIVITY)  TROPONIN I (HIGH SENSITIVITY)   ____________________________________________  EKG  ED ECG REPORT I, Arta Silence, the attending physician, personally viewed and interpreted this ECG.  Date: 07/21/2019 EKG Time: 09 23 Rate: 86 Rhythm: normal sinus rhythm with PVCs QRS Axis: Left axis Intervals: LBBB ST/T  Wave abnormalities: Nonspecific ST abnormalities Narrative Interpretation: no evidence of acute ischemia; no significant change when compared to EKG of 10/04/2013  ____________________________________________  RADIOLOGY  CXR: No focal infiltrate  ____________________________________________   PROCEDURES  Procedure(s) performed: No  Procedures  Critical Care performed: No ____________________________________________   INITIAL IMPRESSION / ASSESSMENT AND PLAN / ED COURSE  Pertinent labs & imaging results that were available during my care of the patient were reviewed by me and considered in my medical decision making (see chart for details).  73 year old female with PMH as noted above presents with atypical left-sided chest pain radiating to her left arm.  It is nonexertional.  It is reproducible to palpation and movement.  She states it is more muted and dull then the chest pain that she has had previously.  Patient has a history of a 50% blockage in 1 of her coronary arteries from a previous catheterization several years ago in [place].  She states that she failed a stress test and is actually scheduled for a catheterization here by Dr. Nehemiah Massed next week.  On exam the patient is overall well-appearing.  Her vital signs are normal except for hypertension.  Physical exam is notable for reproducible left chest wall tenderness.  EKG shows LBBB which is old and no significant ischemic findings.  The patient has a history of CAD and is at increased risk for ACS, however I have a low suspicion for ACS on this presentation given the atypical and reproducible nature of the pain.  There is no clinical evidence for aortic dissection or other vascular etiology.  The patient has no leg swelling or any signs or symptoms of DVT, and has no hypoxia, tachycardia, or other findings to suggest PE.  I will obtain a chest x-ray, cardiac enzymes x2, and discuss disposition plan with Dr. Nehemiah Massed from  cardiology.  ----------------------------------------- 3:06 PM on 07/21/2019 -----------------------------------------  Initial and repeat troponin are both negative with no significant rise.  The patient's pain has almost completely resolved after a low dose of Toradol.  Overall the presentation continues to be consistent with musculoskeletal chest wall pain.  I consulted Dr. Nehemiah Massed from cardiology.  He advised that if the patient symptoms had improved and both troponins were negative, that she would be appropriate for catheterization next week as planned rather than being admitted now.  The patient also would prefer to go home.  I ordered a COVID test since the  patient was supposed to come back on Monday anyway to get one before her catheterization next week.  I gave the patient a thorough return precautions and she expressed understanding.  ____________________________________________   FINAL CLINICAL IMPRESSION(S) / ED DIAGNOSES  Final diagnoses:  Atypical chest pain      NEW MEDICATIONS STARTED DURING THIS VISIT:  New Prescriptions   No medications on file     Note:  This document was prepared using Dragon voice recognition software and may include unintentional dictation errors.    Arta Silence, MD 07/21/19 1515

## 2019-07-21 NOTE — ED Notes (Signed)
Pt states that she is in 7 out of 10 chest pain but she does not want to take anything for the pain.

## 2019-07-22 LAB — NOVEL CORONAVIRUS, NAA (HOSP ORDER, SEND-OUT TO REF LAB; TAT 18-24 HRS): SARS-CoV-2, NAA: NOT DETECTED

## 2019-07-27 ENCOUNTER — Encounter
Admission: RE | Disposition: A | Discharge status: Home / Self Care | Payer: Self-pay | Source: Home / Self Care | Attending: Internal Medicine

## 2019-07-27 ENCOUNTER — Ambulatory Visit
Admission: RE | Admit: 2019-07-27 | Discharge: 2019-07-27 | Disposition: A | Discharge status: Home / Self Care | Payer: PPO | Attending: Internal Medicine | Admitting: Internal Medicine

## 2019-07-27 ENCOUNTER — Telehealth: Payer: Self-pay

## 2019-07-27 ENCOUNTER — Other Ambulatory Visit: Payer: Self-pay

## 2019-07-27 DIAGNOSIS — E785 Hyperlipidemia, unspecified: Secondary | ICD-10-CM | POA: Diagnosis not present

## 2019-07-27 DIAGNOSIS — Z79899 Other long term (current) drug therapy: Secondary | ICD-10-CM | POA: Diagnosis not present

## 2019-07-27 DIAGNOSIS — Z87891 Personal history of nicotine dependence: Secondary | ICD-10-CM | POA: Diagnosis not present

## 2019-07-27 DIAGNOSIS — I10 Essential (primary) hypertension: Secondary | ICD-10-CM | POA: Diagnosis not present

## 2019-07-27 DIAGNOSIS — I209 Angina pectoris, unspecified: Secondary | ICD-10-CM | POA: Diagnosis present

## 2019-07-27 DIAGNOSIS — G629 Polyneuropathy, unspecified: Secondary | ICD-10-CM | POA: Diagnosis not present

## 2019-07-27 DIAGNOSIS — I2510 Atherosclerotic heart disease of native coronary artery without angina pectoris: Secondary | ICD-10-CM | POA: Insufficient documentation

## 2019-07-27 DIAGNOSIS — I25110 Atherosclerotic heart disease of native coronary artery with unstable angina pectoris: Secondary | ICD-10-CM | POA: Diagnosis not present

## 2019-07-27 DIAGNOSIS — Z7982 Long term (current) use of aspirin: Secondary | ICD-10-CM | POA: Insufficient documentation

## 2019-07-27 DIAGNOSIS — R9439 Abnormal result of other cardiovascular function study: Secondary | ICD-10-CM

## 2019-07-27 HISTORY — PX: LEFT HEART CATH AND CORONARY ANGIOGRAPHY: CATH118249

## 2019-07-27 SURGERY — LEFT HEART CATH AND CORONARY ANGIOGRAPHY
Anesthesia: Moderate Sedation | Laterality: Left

## 2019-07-27 MED ORDER — HYDRALAZINE HCL 20 MG/ML IJ SOLN: 10.0000 mg | INTRAMUSCULAR | Status: DC | PRN

## 2019-07-27 MED ORDER — HEPARIN (PORCINE) IN NACL 1000-0.9 UT/500ML-% IV SOLN
INTRAVENOUS | Status: DC | PRN
  Administered 2019-07-27: 500 mL

## 2019-07-27 MED ORDER — SODIUM CHLORIDE 0.9 % IV SOLN: 250.0000 mL | INTRAVENOUS | Status: DC | PRN

## 2019-07-27 MED ORDER — FENTANYL CITRATE (PF) 100 MCG/2ML IJ SOLN
INTRAMUSCULAR | Status: AC
  Filled 2019-07-27: qty 2

## 2019-07-27 MED ORDER — SODIUM CHLORIDE 0.9 % WEIGHT BASED INFUSION
3.0000 mL/kg/h | INTRAVENOUS | Status: AC
  Administered 2019-07-27: 3 mL/kg/h via INTRAVENOUS

## 2019-07-27 MED ORDER — ASPIRIN 81 MG PO CHEW: 81.0000 mg | CHEWABLE_TABLET | ORAL | Status: DC

## 2019-07-27 MED ORDER — MIDAZOLAM HCL 2 MG/2ML IJ SOLN
INTRAMUSCULAR | Status: DC | PRN
  Administered 2019-07-27: 1 mg via INTRAVENOUS

## 2019-07-27 MED ORDER — SODIUM CHLORIDE 0.9 % WEIGHT BASED INFUSION: 1.0000 mL/kg/h | INTRAVENOUS | Status: DC

## 2019-07-27 MED ORDER — LABETALOL HCL 5 MG/ML IV SOLN: 10.0000 mg | INTRAVENOUS | Status: DC | PRN

## 2019-07-27 MED ORDER — ACETAMINOPHEN 325 MG PO TABS: 650.0000 mg | ORAL_TABLET | ORAL | Status: DC | PRN

## 2019-07-27 MED ORDER — ONDANSETRON HCL 4 MG/2ML IJ SOLN: 4.0000 mg | Freq: Four times a day (QID) | INTRAMUSCULAR | Status: DC | PRN

## 2019-07-27 MED ORDER — FENTANYL CITRATE (PF) 100 MCG/2ML IJ SOLN
INTRAMUSCULAR | Status: DC | PRN
  Administered 2019-07-27: 25 ug via INTRAVENOUS

## 2019-07-27 MED ORDER — MIDAZOLAM HCL 2 MG/2ML IJ SOLN
INTRAMUSCULAR | Status: AC
  Filled 2019-07-27: qty 2

## 2019-07-27 MED ORDER — IOHEXOL 300 MG/ML  SOLN
INTRAMUSCULAR | Status: DC | PRN
  Administered 2019-07-27: 13:00:00 95 mL via INTRA_ARTERIAL

## 2019-07-27 MED ORDER — CLOPIDOGREL BISULFATE 75 MG PO TABS: 75.0000 mg | ORAL_TABLET | Freq: Every day | ORAL | 11 refills | Status: DC

## 2019-07-27 MED ORDER — SODIUM CHLORIDE 0.9% FLUSH: 3.0000 mL | Freq: Two times a day (BID) | INTRAVENOUS | Status: DC

## 2019-07-27 MED ORDER — HEPARIN (PORCINE) IN NACL 1000-0.9 UT/500ML-% IV SOLN
INTRAVENOUS | Status: AC
  Filled 2019-07-27: qty 1000

## 2019-07-27 MED ORDER — SODIUM CHLORIDE 0.9% FLUSH: 3.0000 mL | INTRAVENOUS | Status: DC | PRN

## 2019-07-27 SURGICAL SUPPLY — 11 items
CATH INFINITI 5FR ANG PIGTAIL (CATHETERS) ×3 IMPLANT
CATH INFINITI 5FR JL4 (CATHETERS) ×3 IMPLANT
CATH INFINITI JR4 5F (CATHETERS) ×3 IMPLANT
DEVICE CLOSURE MYNXGRIP 5F (Vascular Products) ×3 IMPLANT
GLIDESHEATH SLEND SS 6F .021 (SHEATH) IMPLANT
KIT MANI 3VAL PERCEP (MISCELLANEOUS) ×3 IMPLANT
NEEDLE PERC 18GX7CM (NEEDLE) ×3 IMPLANT
PACK CARDIAC CATH (CUSTOM PROCEDURE TRAY) ×3 IMPLANT
SHEATH AVANTI 5FR X 11CM (SHEATH) ×3 IMPLANT
WIRE GUIDERIGHT .035X150 (WIRE) ×3 IMPLANT
WIRE ROSEN-J .035X260CM (WIRE) IMPLANT

## 2019-07-27 NOTE — Telephone Encounter (Addendum)
Called the pt to discuss the scheduled procedure with Dr.Arida. Unable to lmom x3. Message sts "unable to complete the call at this time".  Pt had a cardiac cath today @ [facility] with Dr.Kowalski.  Scheduled pt FFR with pci with Dr.Arida on 08/02/19.  Kelsey Hampshire, MD  Parris-Godley, Izell [place], RN        This is a patient of Dr. Nehemiah Massed who had cardiac catheterization done which showed 70% stenosis in the proximal LAD.  Please schedule her for [facility] and possible PCI of the LAD with me on August 12 at [facility]. Make sure they started her on Plavix 75 mg once daily.

## 2019-07-27 NOTE — Discharge Instructions (Signed)
Moderate Conscious Sedation, Adult, Care After °These instructions provide you with information about caring for yourself after your procedure. Your health care provider may also give you more specific instructions. Your treatment has been planned according to current medical practices, but problems sometimes occur. Call your health care provider if you have any problems or questions after your procedure. °What can I expect after the procedure? °After your procedure, it is common: °· To feel sleepy for several hours. °· To feel clumsy and have poor balance for several hours. °· To have poor judgment for several hours. °· To vomit if you eat too soon. °Follow these instructions at home: °For at least 24 hours after the procedure: ° °· Do not: °? Participate in activities where you could fall or become injured. °? Drive. °? Use heavy machinery. °? Drink alcohol. °? Take sleeping pills or medicines that cause drowsiness. °? Make important decisions or sign legal documents. °? Take care of children on your own. °· Rest. °Eating and drinking °· Follow the diet recommended by your health care provider. °· If you vomit: °? Drink water, juice, or soup when you can drink without vomiting. °? Make sure you have little or no nausea before eating solid foods. °General instructions °· Have a responsible adult stay with you until you are awake and alert. °· Take over-the-counter and prescription medicines only as told by your health care provider. °· If you smoke, do not smoke without supervision. °· Keep all follow-up visits as told by your health care provider. This is important. °Contact a health care provider if: °· You keep feeling nauseous or you keep vomiting. °· You feel light-headed. °· You develop a rash. °· You have a fever. °Get help right away if: °· You have trouble breathing. °This information is not intended to replace advice given to you by your health care provider. Make sure you discuss any questions you have  with your health care provider. °Document Released: 09/27/2013 Document Revised: 11/19/2017 Document Reviewed: 03/28/2016 °Elsevier Patient Education © 2020 [employer] °Femoral Site Care °This sheet gives you information about how to care for yourself after your procedure. Your health care provider may also give you more specific instructions. If you have problems or questions, contact your health care provider. °What can I expect after the procedure? °After the procedure, it is common to have: °· Bruising that usually fades within 1-2 weeks. °· Tenderness at the site. °Follow these instructions at home: °Wound care °· Follow instructions from your health care provider about how to take care of your insertion site. Make sure you: °? Wash your hands with soap and water before you change your bandage (dressing). If soap and water are not available, use hand sanitizer. °? Change your dressing as told by your health care provider. °? Leave stitches (sutures), skin glue, or adhesive strips in place. These skin closures may need to stay in place for 2 weeks or longer. If adhesive strip edges start to loosen and curl up, you may trim the loose edges. Do not remove adhesive strips completely unless your health care provider tells you to do that. °· Do not take baths, swim, or use a hot tub until your health care provider approves. °· You may shower 24-48 hours after the procedure or as told by your health care provider. °? Gently wash the site with plain soap and water. °? Pat the area dry with a clean towel. °? Do not rub the site. This may cause bleeding. °·   Do not apply powder or lotion to the site. Keep the site clean and dry.  Check your femoral site every day for signs of infection. Check for: ? Redness, swelling, or pain. ? Fluid or blood. ? Warmth. ? Pus or a bad smell. Activity  For the first 2-3 days after your procedure, or as long as directed: ? Avoid climbing stairs as much as possible. ? Do not  squat.  Do not lift anything that is heavier than 10 lb (4.5 kg), or the limit that you are told, until your health care provider says that it is safe.  Rest as directed. ? Avoid sitting for a long time without moving. Get up to take short walks every 1-2 hours.  Do not drive for 24 hours if you were given a medicine to help you relax (sedative). General instructions  Take over-the-counter and prescription medicines only as told by your health care provider.  Keep all follow-up visits as told by your health care provider. This is important. Contact a health care provider if you have:  A fever or chills.  You have redness, swelling, or pain around your insertion site. Get help right away if:  The catheter insertion area swells very fast.  You pass out.  You suddenly start to sweat or your skin gets clammy.  The catheter insertion area is bleeding, and the bleeding does not stop when you hold steady pressure on the area.  The area near or just beyond the catheter insertion site becomes pale, cool, tingly, or numb. These symptoms may represent a serious problem that is an emergency. Do not wait to see if the symptoms will go away. Get medical help right away. Call your local emergency services (911 in the [place] Do not drive yourself to the hospital. Summary  After the procedure, it is common to have bruising that usually fades within 1-2 weeks.  Check your femoral site every day for signs of infection.  Do not lift anything that is heavier than 10 lb (4.5 kg), or the limit that you are told, until your health care provider says that it is safe. This information is not intended to replace advice given to you by your health care provider. Make sure you discuss any questions you have with your health care provider. Document Released: 08/10/2014 Document Revised: 12/20/2017 Document Reviewed: 12/20/2017 Elsevier Patient Education  2020 [employer].

## 2019-07-27 NOTE — Telephone Encounter (Signed)
Attempted to reach the pt again with no success. Unable lmom.

## 2019-07-27 NOTE — Telephone Encounter (Signed)
Call received from specials. During previous attempts to contact the pt she was still in recovery after cath. Pt was d/c Pt is to be called after 3:30-4pm.  Attempted to contact the patient again. Pt phone rings with the same message as the prior attempts. "unable to complete the call at this time."

## 2019-07-31 ENCOUNTER — Other Ambulatory Visit: Payer: Self-pay

## 2019-07-31 ENCOUNTER — Other Ambulatory Visit
Admission: RE | Admit: 2019-07-31 | Discharge: 2019-07-31 | Disposition: A | Discharge status: Home / Self Care | Payer: PPO | Source: Ambulatory Visit | Attending: Cardiovascular Disease | Admitting: Cardiovascular Disease

## 2019-07-31 DIAGNOSIS — Z20828 Contact with and (suspected) exposure to other viral communicable diseases: Secondary | ICD-10-CM | POA: Diagnosis not present

## 2019-07-31 DIAGNOSIS — Z01812 Encounter for preprocedural laboratory examination: Secondary | ICD-10-CM | POA: Insufficient documentation

## 2019-07-31 NOTE — Telephone Encounter (Signed)
Patient returning call for pre cath instructions

## 2019-07-31 NOTE — Telephone Encounter (Signed)
Had trouble reaching patient on numbers listed.  Was able to reach patient on [phone]. She is scheduled for procedure at [facility] on 08/02/19. She needs COVID test. She is aware to go to the [place] today by 3 pm for the test swab.  I gave her Orlene Och address and she is aware to arrive at 10 am on 08/02/19. She verbalized understanding to not have anything to eat or drink after midnight except for her medications including her aspirin and plavix with a small sip of water.  She had lab work last on 07/21/19.   Called Pre admit testing for Covid and they are aware she will be coming today.

## 2019-08-01 ENCOUNTER — Telehealth: Payer: Self-pay | Admitting: *Deleted

## 2019-08-01 LAB — SARS CORONAVIRUS 2 (TAT 6-24 HRS): SARS Coronavirus 2: NEGATIVE

## 2019-08-01 NOTE — Telephone Encounter (Signed)
Pt contacted pre-catheterization scheduled at [facility] for: Wednesday August 02, 2019 12 noon Verified arrival time and place: [facility]) at: 9:30 AM/needs BMP/CBC   No solid food after midnight prior to cath, clear liquids until 5 AM day of procedure. Contrast allergy: no  AM meds can be  taken pre-cath with sip of water including: ASA 81 mg Plavix 75 mg   Confirmed patient has responsible person to drive home post procedure and observe 24 hours after arriving home: yes  Due to Covid-19 pandemic, only one support person will be allowed with patient. Must be the same support person for that patient's entire stay, will be screened and required to wear a mask.   Patients are required to wear a mask when they enter the hospital.       COVID-19 Pre-Screening Questions:  . In the past 7 to 10 days have you had a cough,  shortness of breath, headache, congestion, fever (100 or greater) body aches, chills, sore throat, or sudden loss of taste or sense of smell? no . Have you been around anyone with known Covid 19? no . Have you been around anyone who is awaiting Covid 19 test results in the past 7 to 10 days? no . Have you been around anyone who has been exposed to Covid 19, or has mentioned symptoms of Covid 19 within the past 7 to 10 days? no   I reviewed procedure/mask/visitor, Covid-19 screening questions with patient, she verbalized understanding, thanked me for call.

## 2019-08-02 ENCOUNTER — Ambulatory Visit (HOSPITAL_COMMUNITY)
Admission: RE | Admit: 2019-08-02 | Discharge: 2019-08-03 | Disposition: A | Discharge status: Home / Self Care | Payer: PPO | Attending: Cardiovascular Disease | Admitting: Cardiovascular Disease

## 2019-08-02 ENCOUNTER — Encounter (HOSPITAL_COMMUNITY): Payer: Self-pay | Admitting: Cardiovascular Disease

## 2019-08-02 ENCOUNTER — Other Ambulatory Visit: Payer: Self-pay

## 2019-08-02 ENCOUNTER — Encounter (HOSPITAL_COMMUNITY)
Admission: RE | Disposition: A | Discharge status: Home / Self Care | Payer: Self-pay | Source: Home / Self Care | Attending: Cardiovascular Disease

## 2019-08-02 DIAGNOSIS — I25118 Atherosclerotic heart disease of native coronary artery with other forms of angina pectoris: Secondary | ICD-10-CM | POA: Insufficient documentation

## 2019-08-02 DIAGNOSIS — E785 Hyperlipidemia, unspecified: Secondary | ICD-10-CM | POA: Diagnosis present

## 2019-08-02 DIAGNOSIS — I2584 Coronary atherosclerosis due to calcified coronary lesion: Secondary | ICD-10-CM | POA: Insufficient documentation

## 2019-08-02 DIAGNOSIS — I10 Essential (primary) hypertension: Secondary | ICD-10-CM | POA: Diagnosis not present

## 2019-08-02 DIAGNOSIS — Z79899 Other long term (current) drug therapy: Secondary | ICD-10-CM | POA: Diagnosis not present

## 2019-08-02 DIAGNOSIS — G629 Polyneuropathy, unspecified: Secondary | ICD-10-CM | POA: Insufficient documentation

## 2019-08-02 DIAGNOSIS — Z881 Allergy status to other antibiotic agents status: Secondary | ICD-10-CM | POA: Insufficient documentation

## 2019-08-02 DIAGNOSIS — E782 Mixed hyperlipidemia: Secondary | ICD-10-CM | POA: Diagnosis not present

## 2019-08-02 DIAGNOSIS — Z7982 Long term (current) use of aspirin: Secondary | ICD-10-CM | POA: Insufficient documentation

## 2019-08-02 DIAGNOSIS — Z9013 Acquired absence of bilateral breasts and nipples: Secondary | ICD-10-CM | POA: Diagnosis not present

## 2019-08-02 DIAGNOSIS — Z87891 Personal history of nicotine dependence: Secondary | ICD-10-CM | POA: Insufficient documentation

## 2019-08-02 DIAGNOSIS — G4700 Insomnia, unspecified: Secondary | ICD-10-CM | POA: Diagnosis not present

## 2019-08-02 DIAGNOSIS — I208 Other forms of angina pectoris: Secondary | ICD-10-CM | POA: Diagnosis present

## 2019-08-02 DIAGNOSIS — Z955 Presence of coronary angioplasty implant and graft: Secondary | ICD-10-CM

## 2019-08-02 DIAGNOSIS — Z885 Allergy status to narcotic agent status: Secondary | ICD-10-CM | POA: Diagnosis not present

## 2019-08-02 DIAGNOSIS — Z8249 Family history of ischemic heart disease and other diseases of the circulatory system: Secondary | ICD-10-CM | POA: Diagnosis not present

## 2019-08-02 DIAGNOSIS — I2089 Other forms of angina pectoris: Secondary | ICD-10-CM | POA: Diagnosis present

## 2019-08-02 DIAGNOSIS — Z888 Allergy status to other drugs, medicaments and biological substances status: Secondary | ICD-10-CM | POA: Diagnosis not present

## 2019-08-02 HISTORY — PX: INTRAVASCULAR PRESSURE WIRE/FFR STUDY: CATH118243

## 2019-08-02 HISTORY — PX: CORONARY STENT INTERVENTION: CATH118234

## 2019-08-02 HISTORY — DX: Presence of coronary angioplasty implant and graft: Z95.5

## 2019-08-02 LAB — BASIC METABOLIC PANEL
Anion gap: 10 (ref 5–15)
BUN: 14 mg/dL (ref 8–23)
CO2: 24 mmol/L (ref 22–32)
Calcium: 9.7 mg/dL (ref 8.9–10.3)
Chloride: 106 mmol/L (ref 98–111)
Creatinine, Ser: 0.87 mg/dL (ref 0.44–1.00)
GFR calc Af Amer: >60 mL/min (ref >60)
GFR calc non Af Amer: >60 mL/min (ref >60)
Glucose, Bld: 100 mg/dL — ABNORMAL HIGH (ref 70–99)
Potassium: 4.1 mmol/L (ref 3.5–5.1)
Sodium: 140 mmol/L (ref 135–145)

## 2019-08-02 LAB — CBC
HCT: 35.9 % — ABNORMAL LOW (ref 36.0–46.0)
Hemoglobin: 11.7 g/dL — ABNORMAL LOW (ref 12.0–15.0)
MCH: 31.3 pg (ref 26.0–34.0)
MCHC: 32.6 g/dL (ref 30.0–36.0)
MCV: 96 fL (ref 80.0–100.0)
Platelets: 259 10*3/uL (ref 150–400)
RBC: 3.74 MIL/uL — ABNORMAL LOW (ref 3.87–5.11)
RDW: 12.9 % (ref 11.5–15.5)
WBC: 5.9 10*3/uL (ref 4.0–10.5)
nRBC: 0 % (ref 0.0–0.2)

## 2019-08-02 LAB — POCT ACTIVATED CLOTTING TIME: Activated Clotting Time: 599 seconds

## 2019-08-02 SURGERY — INTRAVASCULAR PRESSURE WIRE/FFR STUDY
Anesthesia: LOCAL

## 2019-08-02 MED ORDER — SODIUM CHLORIDE 0.9% FLUSH: 3.0000 mL | INTRAVENOUS | Status: DC | PRN

## 2019-08-02 MED ORDER — CLOPIDOGREL BISULFATE 75 MG PO TABS
75.0000 mg | ORAL_TABLET | Freq: Every day | ORAL | Status: DC
  Administered 2019-08-03: 75 mg via ORAL
  Filled 2019-08-02: qty 1

## 2019-08-02 MED ORDER — ASPIRIN 81 MG PO CHEW: 81.0000 mg | CHEWABLE_TABLET | ORAL | Status: DC

## 2019-08-02 MED ORDER — BIVALIRUDIN BOLUS VIA INFUSION - CUPID
INTRAVENOUS | Status: DC | PRN
  Administered 2019-08-02: 57.825 mg via INTRAVENOUS

## 2019-08-02 MED ORDER — SODIUM CHLORIDE 0.9% FLUSH
3.0000 mL | Freq: Two times a day (BID) | INTRAVENOUS | Status: DC
  Administered 2019-08-03: 09:00:00 3 mL via INTRAVENOUS

## 2019-08-02 MED ORDER — ACETAMINOPHEN 325 MG PO TABS: 650.0000 mg | ORAL_TABLET | ORAL | Status: DC | PRN

## 2019-08-02 MED ORDER — CLOPIDOGREL BISULFATE 300 MG PO TABS
ORAL_TABLET | ORAL | Status: DC | PRN
  Administered 2019-08-02: 300 mg via ORAL

## 2019-08-02 MED ORDER — SODIUM CHLORIDE 0.9 % IV SOLN: INTRAVENOUS | Status: AC

## 2019-08-02 MED ORDER — SODIUM CHLORIDE 0.9 % IV SOLN: 250.0000 mL | INTRAVENOUS | Status: DC | PRN

## 2019-08-02 MED ORDER — FENTANYL CITRATE (PF) 100 MCG/2ML IJ SOLN
INTRAMUSCULAR | Status: DC | PRN
  Administered 2019-08-02: 25 ug via INTRAVENOUS
  Administered 2019-08-02: 50 ug via INTRAVENOUS
  Administered 2019-08-02: 25 ug via INTRAVENOUS

## 2019-08-02 MED ORDER — FENTANYL CITRATE (PF) 100 MCG/2ML IJ SOLN
INTRAMUSCULAR | Status: AC
  Filled 2019-08-02: qty 2

## 2019-08-02 MED ORDER — ASPIRIN EC 81 MG PO TBEC
81.0000 mg | DELAYED_RELEASE_TABLET | Freq: Every day | ORAL | Status: DC
  Administered 2019-08-03: 81 mg via ORAL
  Filled 2019-08-02: qty 1

## 2019-08-02 MED ORDER — SODIUM CHLORIDE 0.9 % WEIGHT BASED INFUSION: 1.0000 mL/kg/h | INTRAVENOUS | Status: DC

## 2019-08-02 MED ORDER — NITROGLYCERIN 1 MG/10 ML FOR IR/CATH LAB
INTRA_ARTERIAL | Status: AC
  Filled 2019-08-02: qty 10

## 2019-08-02 MED ORDER — METOPROLOL SUCCINATE ER 50 MG PO TB24
50.0000 mg | ORAL_TABLET | Freq: Every day | ORAL | Status: DC
  Administered 2019-08-03: 50 mg via ORAL
  Filled 2019-08-02: qty 1

## 2019-08-02 MED ORDER — SODIUM CHLORIDE 0.9% FLUSH
3.0000 mL | Freq: Two times a day (BID) | INTRAVENOUS | Status: DC
  Administered 2019-08-03: 3 mL via INTRAVENOUS

## 2019-08-02 MED ORDER — BIVALIRUDIN TRIFLUOROACETATE 250 MG IV SOLR
INTRAVENOUS | Status: AC
  Filled 2019-08-02: qty 250

## 2019-08-02 MED ORDER — CLOPIDOGREL BISULFATE 75 MG PO TABS: 75.0000 mg | ORAL_TABLET | ORAL | Status: DC

## 2019-08-02 MED ORDER — VITAMIN C 500 MG PO TABS
500.0000 mg | ORAL_TABLET | Freq: Every day | ORAL | Status: DC
  Administered 2019-08-03: 500 mg via ORAL
  Filled 2019-08-02: qty 1

## 2019-08-02 MED ORDER — ALPRAZOLAM 0.5 MG PO TABS: 1.0000 mg | ORAL_TABLET | Freq: Every evening | ORAL | Status: DC | PRN

## 2019-08-02 MED ORDER — ONDANSETRON HCL 4 MG/2ML IJ SOLN
4.0000 mg | Freq: Four times a day (QID) | INTRAMUSCULAR | Status: DC | PRN
  Administered 2019-08-02: 4 mg via INTRAVENOUS

## 2019-08-02 MED ORDER — LIDOCAINE HCL (PF) 1 % IJ SOLN
INTRAMUSCULAR | Status: DC | PRN
  Administered 2019-08-02: 15 mL
  Administered 2019-08-02: 7 mL

## 2019-08-02 MED ORDER — FISH OIL 1000 MG PO CAPS: 1000.0000 mg | ORAL_CAPSULE | Freq: Every day | ORAL | Status: DC

## 2019-08-02 MED ORDER — SODIUM CHLORIDE 0.9 % IV SOLN
INTRAVENOUS | Status: AC | PRN
  Administered 2019-08-02: 1.75 mg/kg/h via INTRAVENOUS

## 2019-08-02 MED ORDER — CLOPIDOGREL BISULFATE 300 MG PO TABS
ORAL_TABLET | ORAL | Status: AC
  Filled 2019-08-02: qty 1

## 2019-08-02 MED ORDER — LIDOCAINE HCL (PF) 1 % IJ SOLN
INTRAMUSCULAR | Status: AC
  Filled 2019-08-02: qty 30

## 2019-08-02 MED ORDER — IRBESARTAN 150 MG PO TABS
300.0000 mg | ORAL_TABLET | Freq: Every day | ORAL | Status: DC
  Administered 2019-08-03: 300 mg via ORAL
  Filled 2019-08-02: qty 2

## 2019-08-02 MED ORDER — SODIUM CHLORIDE 0.9 % WEIGHT BASED INFUSION
3.0000 mL/kg/h | INTRAVENOUS | Status: DC
  Administered 2019-08-02: 3 mL/kg/h via INTRAVENOUS

## 2019-08-02 MED ORDER — ATORVASTATIN CALCIUM 40 MG PO TABS
40.0000 mg | ORAL_TABLET | Freq: Every day | ORAL | Status: DC
  Administered 2019-08-02: 40 mg via ORAL
  Filled 2019-08-02: qty 1

## 2019-08-02 MED ORDER — MIDAZOLAM HCL 2 MG/2ML IJ SOLN
INTRAMUSCULAR | Status: AC
  Filled 2019-08-02: qty 2

## 2019-08-02 MED ORDER — HEPARIN (PORCINE) IN NACL 1000-0.9 UT/500ML-% IV SOLN
INTRAVENOUS | Status: DC | PRN
  Administered 2019-08-02 (×2): 500 mL

## 2019-08-02 MED ORDER — MIDAZOLAM HCL 2 MG/2ML IJ SOLN
INTRAMUSCULAR | Status: DC | PRN
  Administered 2019-08-02 (×2): 1 mg via INTRAVENOUS

## 2019-08-02 MED ORDER — ONDANSETRON HCL 4 MG/2ML IJ SOLN
INTRAMUSCULAR | Status: AC
  Filled 2019-08-02: qty 2

## 2019-08-02 MED ORDER — HEPARIN (PORCINE) IN NACL 1000-0.9 UT/500ML-% IV SOLN
INTRAVENOUS | Status: AC
  Filled 2019-08-02: qty 1000

## 2019-08-02 MED ORDER — IOHEXOL 350 MG/ML SOLN
INTRAVENOUS | Status: DC | PRN
  Administered 2019-08-02: 105 mL via INTRACARDIAC

## 2019-08-02 SURGICAL SUPPLY — 18 items
BALLN EMERGE MR 2.5X8 (BALLOONS) ×2
BALLN SAPPHIRE ~~LOC~~ 2.75X8 (BALLOONS) ×2 IMPLANT
BALLOON EMERGE MR 2.5X8 (BALLOONS) ×1 IMPLANT
CATH VISTA GUIDE 6FR XBLAD4 (CATHETERS) ×2 IMPLANT
CLOSURE MYNX CONTROL 6F/7F (Vascular Products) ×2 IMPLANT
GUIDEWIRE PRESSURE COMET II (WIRE) ×2 IMPLANT
KIT ENCORE 26 ADVANTAGE (KITS) ×2 IMPLANT
KIT ESSENTIALS PG (KITS) ×2 IMPLANT
KIT HEART LEFT (KITS) ×2 IMPLANT
KIT MICROPUNCTURE NIT STIFF (SHEATH) ×2 IMPLANT
PACK CARDIAC CATHETERIZATION (CUSTOM PROCEDURE TRAY) ×2 IMPLANT
SHEATH PINNACLE 5F 10CM (SHEATH) IMPLANT
SHEATH PINNACLE 6F 10CM (SHEATH) ×2 IMPLANT
SHEATH PROBE COVER 6X72 (BAG) ×2 IMPLANT
STENT RESOLUTE ONYX 2.5X12 (Permanent Stent) ×2 IMPLANT
TRANSDUCER W/STOPCOCK (MISCELLANEOUS) ×2 IMPLANT
TUBING CIL FLEX 10 FLL-RA (TUBING) ×2 IMPLANT
WIRE EMERALD 3MM-J .035X150CM (WIRE) ×2 IMPLANT

## 2019-08-02 NOTE — H&P (Signed)
Progress Notes - documented in this encounter Kelsey Dibble, MD - 07/19/2019 11:15 AM EDT Formatting of this note might be different from the original. Established Patient Visit   Chief Complaint: Chief Complaint  Patient presents with  . Coronary Artery Disease  stress echo today  . Hypertension  . Hyperlipidemia  Date of Service: 07/19/2019 Date of Birth: 1946-03-08 PCP: Harrold Donath, MD  History of Present Illness: Kelsey Carter is a 73 y.o.female patient  Angina Quality of the pain is described as pressure.  Location: entire anterior chest . Occurs mainly with exertion.  Duration: intermittent (10-20 minutes).  The pain radiates to the back.  The symptoms occur daily.  Associated symptoms include: shortness of breath.  The pattern of symptoms has been worsening with increased severity and frequency.  Precipitating factors: strenuous exertion.  Factors which relieve the discomfort are rest.  The pain began 3 months ago The characteristics of the pain suggest typical angina.  The severity on a scale of 1 to 10 is 5.  The [place] Cardiovascular Society angina scale is 3.  Risk factors for coronary artery disease include: dyslipidemia and hypertension Previous diagnostic testing for coronary artery disease includes: cardiac catheterization. Previous history of cardiac disease includes angina and coronary artery disease  Appropriate use criteria for cath for cad High pretest probability of coronary artery disease due to high risk profile and progressive and symptomatic angina -yes canadian class III symptoms yes Persistent and progressive symptoms of angina despite addition of antianginal medication management-yes with b-blocker Symptoms of angina and/or anginal equivalent during stress test- yes New or worsening myocardial ischemia by myocardial perfusion study-yes Type of stress test Exercise Stress Echo Stress test high risk Severe reversible  perfusion defect of anterior and apex Abnormal treadmill EKG with abnormal Duke treadmill score-yes New regional wall motion abnormalities by echocardiogram and/or by gated images from stress test-yes High coronary artery disease risk profile including age, postmenopausal female, HTN, Hyperlipidemia and history of heart disease  .   Past Medical and Surgical History  Past Medical History Past Medical History:  Diagnosis Date  . Cancer (CMS-HCC)  2 times  . Chronic low back pain  . Chronic sacroiliac joint pain  . Coronary artery disease  . DDD (degenerative disc disease), lumbar  . Hyperlipidemia  . Hypertension  . Insomnia  . Neuropathy   Past Surgical History She has a past surgical history that includes double mastectomy (Bilateral); Appendectomy; cardiac catheterization (2018); Cesarean section; Tonsillectomy; and L4-5 decompression, L5-S1 far lateral discectomy (06/29/2018).   Medications and Allergies  Current Medications  Current Outpatient Medications on File Prior to Visit  Medication Sig Dispense Refill  . alendronate (FOSAMAX) 70 MG tablet Take 70 mg by mouth every 7 (seven) days Take with a full glass of water. Do not lie down for the next 30 min.  Marland Kitchen ALPRAZolam (XANAX) 1 MG tablet Take 1 tablet (1 mg total) by mouth nightly 30 tablet 5  . anastrozole (ARIMIDEX) 1 mg tablet Take 1 tablet by mouth once daily  . aspirin 81 MG EC tablet Take 1 tablet by mouth once daily  . atorvastatin (LIPITOR) 40 MG tablet Take 1 tablet (40 mg total) by mouth once daily 90 tablet 3  . calcium carbonate (CALCIUM 500 ORAL) Take 1 tablet by mouth once daily  . cholecalciferol (VITAMIN D3) 2,000 unit capsule Take 1 capsule by mouth once daily  . esomeprazole (NEXIUM) 20 MG DR capsule Take 20 mg by mouth once daily  .  magnesium 250 mg Tab Take 1 tablet by mouth once daily  . metoprolol succinate (TOPROL-XL) 50 MG XL tablet Take 1 tablet (50 mg total) by mouth once daily 90 tablet 1  .  telmisartan (MICARDIS) 80 MG tablet Take 1 tablet (80 mg total) by mouth once daily 90 tablet 11   No current facility-administered medications on file prior to visit.   Allergies: Ciprofloxacin; Gabapentin; and Hydrocodone-acetaminophen  Social and Family History  Social History reports that she has quit smoking. She has never used smokeless tobacco. She reports that she does not drink alcohol or use drugs.  Family History Family History  Problem Relation Age of Onset  . High blood pressure (Hypertension) Mother  . Cervical cancer Mother  . Cancer Father   Review of Systems   Review of Systems  Positive for sob anginal equiv Negative for weight gain weight loss, weakness, vision change, hearing loss, cough, congestion, PND, orthopnea, heartburn, nausea, diaphoresis, vomiting, diarrhea, bloody stool, melena, stomach pain, extremity pain, leg weakness, leg cramping, leg blood clots, headache, blackouts, nosebleed, trouble swallowing, mouth pain, urinary frequency, urination at night, muscle weakness, skin lesions, skin rashes, tingling ,ulcers, numbness, anxiety, and/or depression Physical Examination   Vitals:BP 150/88  Pulse 93  Ht 162.6 cm (5\' 4" )  Wt 78 kg (171 lb 15.3 oz)  BMI 29.52 kg/m  Ht:162.6 cm (5\' 4" ) Wt:78 kg (171 lb 15.3 oz) CWC:BJSE surface area is 1.88 meters squared. Body mass index is 29.52 kg/m. Appearance: well appearing in no acute distress HEENT: Pupils equally reactive to light and accomodation, no xanthalasma  Neck: Supple, no apparent thyromegaly, masses, or lymphadenopathy  Lungs: normal respiratory effort; no crackles, no rhonchi, no wheezes Heart: Regular rate and rhythm. Normal S1 S2 No gallops, murmur, no rub, PMI is normal size and placement. carotid upstroke normal without bruit. Jugular venous pressure is normal Abdomen: soft, nontender, not distended with normal bowel sounds. No apparent hepatosplenomegally. Abdominal aorta is normal size without  bruit Extremities: no edema, no ulcers, no clubbing, no cyanosis Peripheral Pulses: 2+ in upper extremities, 2+ femoral pulses bilaterally, 2+lower extremity  Musculoskeletal; Normal muscle tone without kyphosis Neurological: Oriented and Alert, Cranial nerves intact  Assessment   73 y.o. female with  Encounter Diagnoses  Name Primary?  . Coronary artery disease of native artery of native heart with stable angina pectoris (CMS-HCC) Yes  . Hyperlipidemia, mixed  . Benign essential HTN   Plan  -Left heart cardiac catheterization for Canadian class 3 anginal symptoms . The patient understands the risks and benefits of cardiac catheterization. This includes the possibility of death, stroke, heart attack, infection, bleeding, bruising, reaction to medications, blood clots. The patient is at low risk for conscious sedation  No orders of the defined types were placed in this encounter.  No follow-ups on file.  Kelsey Dibble, MD   Addendum on August 02, 2019: The patient underwent cardiac catheterization by Dr. Nehemiah Massed last week at [facility] which showed 70% stenosis in the proximal LAD which was noted to be heavily calcified.  In addition, there was moderate ostial stenosis and given the complexity of the disease and possible need for atherectomy, she was referred for flow reserve evaluation to be done at [facility] with possible PCI if this comes back significant. The patient has been experiencing dyspnea with minimal exertion with occasional associated substernal chest tightness.  She did have a stress echo done which showed possible anterior wall ischemia.  Current symptoms are consistent with class III  angina on 1 antianginal medication and intermediate risk abnormal stress test.

## 2019-08-03 DIAGNOSIS — E782 Mixed hyperlipidemia: Secondary | ICD-10-CM | POA: Diagnosis not present

## 2019-08-03 DIAGNOSIS — E785 Hyperlipidemia, unspecified: Secondary | ICD-10-CM

## 2019-08-03 DIAGNOSIS — G629 Polyneuropathy, unspecified: Secondary | ICD-10-CM | POA: Diagnosis not present

## 2019-08-03 DIAGNOSIS — Z79899 Other long term (current) drug therapy: Secondary | ICD-10-CM | POA: Diagnosis not present

## 2019-08-03 DIAGNOSIS — I10 Essential (primary) hypertension: Secondary | ICD-10-CM | POA: Diagnosis not present

## 2019-08-03 DIAGNOSIS — I208 Other forms of angina pectoris: Secondary | ICD-10-CM | POA: Diagnosis not present

## 2019-08-03 DIAGNOSIS — Z888 Allergy status to other drugs, medicaments and biological substances status: Secondary | ICD-10-CM | POA: Diagnosis not present

## 2019-08-03 DIAGNOSIS — G4700 Insomnia, unspecified: Secondary | ICD-10-CM | POA: Diagnosis not present

## 2019-08-03 DIAGNOSIS — Z7982 Long term (current) use of aspirin: Secondary | ICD-10-CM | POA: Diagnosis not present

## 2019-08-03 DIAGNOSIS — I2584 Coronary atherosclerosis due to calcified coronary lesion: Secondary | ICD-10-CM | POA: Diagnosis not present

## 2019-08-03 DIAGNOSIS — Z87891 Personal history of nicotine dependence: Secondary | ICD-10-CM | POA: Diagnosis not present

## 2019-08-03 DIAGNOSIS — Z881 Allergy status to other antibiotic agents status: Secondary | ICD-10-CM | POA: Diagnosis not present

## 2019-08-03 DIAGNOSIS — I25118 Atherosclerotic heart disease of native coronary artery with other forms of angina pectoris: Secondary | ICD-10-CM | POA: Diagnosis not present

## 2019-08-03 DIAGNOSIS — Z885 Allergy status to narcotic agent status: Secondary | ICD-10-CM | POA: Diagnosis not present

## 2019-08-03 LAB — BASIC METABOLIC PANEL
Anion gap: 10 (ref 5–15)
BUN: 14 mg/dL (ref 8–23)
CO2: 23 mmol/L (ref 22–32)
Calcium: 9.2 mg/dL (ref 8.9–10.3)
Chloride: 107 mmol/L (ref 98–111)
Creatinine, Ser: 0.94 mg/dL (ref 0.44–1.00)
GFR calc Af Amer: >60 mL/min (ref >60)
GFR calc non Af Amer: >60 mL/min (ref >60)
Glucose, Bld: 98 mg/dL (ref 70–99)
Potassium: 4.1 mmol/L (ref 3.5–5.1)
Sodium: 140 mmol/L (ref 135–145)

## 2019-08-03 LAB — CBC
HCT: 32.4 % — ABNORMAL LOW (ref 36.0–46.0)
Hemoglobin: 10.8 g/dL — ABNORMAL LOW (ref 12.0–15.0)
MCH: 32.7 pg (ref 26.0–34.0)
MCHC: 33.3 g/dL (ref 30.0–36.0)
MCV: 98.2 fL (ref 80.0–100.0)
Platelets: 297 10*3/uL (ref 150–400)
RBC: 3.3 MIL/uL — ABNORMAL LOW (ref 3.87–5.11)
RDW: 13.2 % (ref 11.5–15.5)
WBC: 6.2 10*3/uL (ref 4.0–10.5)
nRBC: 0 % (ref 0.0–0.2)

## 2019-08-03 MED ORDER — CLOPIDOGREL BISULFATE 75 MG PO TABS: 75.0000 mg | ORAL_TABLET | Freq: Every day | ORAL | 2 refills | Status: AC

## 2019-08-03 MED FILL — Nitroglycerin IV Soln 100 MCG/ML in D5W: INTRA_ARTERIAL | Qty: 10 | Status: AC

## 2019-08-03 NOTE — Progress Notes (Signed)
CARDIAC REHAB PHASE I   PRE:  Rate/Rhythm: 64 SR    BP: sitting 141/77    SaO2: 100 RA  MODE:  Ambulation: 470 ft   POST:  Rate/Rhythm: 93 SR    BP: sitting 131/84     SaO2: 100 RA  Tolerated well although does have slight unsteadiness due to neuropathy. Sts she is not SOB with walking and no CP. Feels well. Ed completed with pt including Plavix/ASA, restrictions, diet, exercise, NTG, and CRPII. Will refer to [place]. She understands importance of Plavix. [place], ACSM 08/03/2019 9:43 AM

## 2019-08-03 NOTE — Plan of Care (Signed)
  Problem: Education: Goal: Knowledge of General Education information will improve Description: Including pain rating scale, medication(s)/side effects and non-pharmacologic comfort measures Outcome: Completed/Met   Problem: Health Behavior/Discharge Planning: Goal: Ability to manage health-related needs will improve Outcome: Completed/Met   Problem: Education: Goal: Understanding of CV disease, CV risk reduction, and recovery process will improve Outcome: Completed/Met Goal: Individualized Educational Video(s) Outcome: Completed/Met   Problem: Activity: Goal: Ability to return to baseline activity level will improve Outcome: Completed/Met  Pt is able to walk without being as sob. Problem: Cardiovascular: Goal: Ability to achieve and maintain adequate cardiovascular perfusion will improve Outcome: Completed/Met Goal: Vascular access site(s) Level 0-1 will be maintained Outcome: Completed/Met   Pt 's site is a 0. Education given on site care.  Problem: Health Behavior/Discharge Planning: Goal: Ability to safely manage health-related needs after discharge will improve Outcome: Completed/Met  Pt verbalized understanding via teachback.

## 2019-08-03 NOTE — Discharge Summary (Signed)
Discharge Summary    Patient ID: [id number] ONEIL,  MRN: [id number], DOB/AGE: 1946/09/12 73 y.o.  Admit date: 08/02/2019 Discharge date: 08/03/2019  Primary Care Provider: Kirk Ruths Primary Cardiologist: No primary care provider on file.  Discharge Diagnoses    Principal Problem:   Effort angina [facility]) Active Problems:   Hyperlipidemia   Benign essential HTN   Allergies Allergies  Allergen Reactions  . Hydrocodone-Acetaminophen Nausea And Vomiting    Severe vomitting  . Morphine And Related     Pt preference   . Ciprofloxacin Other (See Comments)    Joint pain   . Gabapentin Other (See Comments)    Eyes blurry; vision decreased, dizziness     Diagnostic Studies/Procedures    Cath: 08/02/19   1st Mrg lesion is 30% stenosed.  Ost LAD lesion is 40% stenosed.  Mid LAD lesion is 50% stenosed.  Prox LAD lesion is 75% stenosed.  Post intervention, there is a 0% residual stenosis.  A drug-eluting stent was successfully placed using a STENT RESOLUTE ONYX 2.5X12.   1.  Flow reserve evaluation of the LAD revealed significant stenosis with a DFR ratio of 0.85. 2.  Successful angioplasty and drug-eluting stent placement to the proximal LAD.  There was moderate ostial and mid LAD disease that was left to be treated medically.  Recommendations: Continue dual antiplatelet therapy for at least 6 months. Aggressive treatment of risk factors. Likely discharge home tomorrow.  Recommend follow-up with Dr. Nehemiah Massed in [place] in 1 to 2 weeks.  Diagnostic Dominance: Right  Intervention    _____________   History of Present Illness     Mrs. Rada is a 73 yo female with PMH of HTN, HL, GERD, anemia and Breast Ca who underwent cardiac catheterization by Dr. Nehemiah Massed one week prior at [facility] which showed 70% stenosis in the proximal LAD which was noted to be heavily calcified.  In addition, there was moderate ostial stenosis and given the complexity of  the disease and possible need for atherectomy, she was referred for flow reserve evaluation to be done at [facility] with possible PCI.  If this came back significant. The patient had been experiencing dyspnea with minimal exertion with occasional associated substernal chest tightness.  She did have a stress echo done which showed possible anterior wall ischemia.  Current symptoms are consistent with class III angina on 1 antianginal medication and intermediate risk abnormal stress test. She presented for outpatient cath with Dr. Fletcher Anon.   Hospital Course     Underwent cath noted above with FFR of the LAD showing significant stenosis with DFR ratio of 0.85. Had successful PCI/DESx1 to the pLAD with moderate stenosis of the ostial and mLAD to be treated medically. Placed on DAPT with ASA/plavix for at least 6 months. No complications noted post cath. Worked well with cardiac rehab without recurrent chest pain. Continued on BB, high dose statin, and ARB at the time of discharge.   General: Well developed, well nourished, female appearing in no acute distress. Head: Normocephalic, atraumatic.  Neck: Supple without bruits, no JVD. Lungs:  Resp regular and unlabored, CTA. Heart: RRR, S1, S2, no S3, S4, or murmur; no rub. Abdomen: Soft, non-tender, non-distended with normoactive bowel sounds.  Extremities: No clubbing, cyanosis, edema. Distal pedal pulses are 2+ bilaterally. Right femoral cath site stable without bruising or hematoma Neuro: Alert and oriented X 3. Moves all extremities spontaneously. Psych: Normal affect.  MARQUETTE PIONTEK was seen by Dr. Fletcher Anon and determined stable  for discharge home. Follow up in the office has been arranged. Medications are listed below.   _____________  Discharge Vitals Blood pressure 135/69, pulse 67, temperature 97.8 F (36.6 C), temperature source Oral, resp. rate (!) 9, height 5\' 4"  (1.626 m), weight 78.3 kg, SpO2 99 %.  Filed Weights   08/02/19 0853 08/03/19  0625  Weight: 77.1 kg 78.3 kg    Labs & Radiologic Studies    CBC Recent Labs    08/02/19 1012 08/03/19 0623  WBC 5.9 6.2  HGB 11.7* 10.8*  HCT 35.9* 32.4*  MCV 96.0 98.2  PLT 259 812   Basic Metabolic Panel Recent Labs    08/02/19 1012 08/03/19 0623  NA 140 140  K 4.1 4.1  CL 106 107  CO2 24 23  GLUCOSE 100* 98  BUN 14 14  CREATININE 0.87 0.94  CALCIUM 9.7 9.2   Liver Function Tests No results for input(s): AST, ALT, ALKPHOS, BILITOT, PROT, ALBUMIN in the last 72 hours. No results for input(s): LIPASE, AMYLASE in the last 72 hours. Cardiac Enzymes No results for input(s): CKTOTAL, CKMB, CKMBINDEX, TROPONINI in the last 72 hours. BNP Invalid input(s): POCBNP D-Dimer No results for input(s): DDIMER in the last 72 hours. Hemoglobin A1C No results for input(s): HGBA1C in the last 72 hours. Fasting Lipid Panel No results for input(s): CHOL, HDL, LDLCALC, TRIG, CHOLHDL, LDLDIRECT in the last 72 hours. Thyroid Function Tests No results for input(s): TSH, T4TOTAL, T3FREE, THYROIDAB in the last 72 hours.  Invalid input(s): FREET3 _____________  Dg Chest 2 View  Result Date: 07/21/2019 CLINICAL DATA:  Chest pain EXAM: CHEST - 2 VIEW COMPARISON:  09/22/2011 FINDINGS: Normal heart size. Blunting of the right cardiophrenic sulcus from fat pad by 2014 CT. Stable mediastinal contours. There is no edema, consolidation, effusion, or pneumothorax. Mastectomy. Remote lumbar compression fracture deformity. IMPRESSION: No evidence of acute disease. Electronically Signed   By: Monte Fantasia M.D.   On: 07/21/2019 10:37   Disposition   Pt is being discharged home today in good condition.  Follow-up Plans & Appointments    Follow-up Information    Corey Skains, MD Follow up.   Specialty: Cardiology Why: within the next 2 weeks. Contact information: [facility] [place] [address] [phone]          Discharge  Instructions    AMB Referral to Cardiac Rehabilitation - Phase II   Complete by: As directed    Diagnosis:  Stable Angina Coronary Stents     After initial evaluation and assessments completed: Virtual Based Care may be provided alone or in conjunction with Phase 2 Cardiac Rehab based on patient barriers.: Yes   Call MD for:  redness, tenderness, or signs of infection (pain, swelling, redness, odor or green/yellow discharge around incision site)   Complete by: As directed    Diet - low sodium heart healthy   Complete by: As directed    Discharge instructions   Complete by: As directed    Groin Site Care Refer to this sheet in the next few weeks. These instructions provide you with information on caring for yourself after your procedure. Your caregiver may also give you more specific instructions. Your treatment has been planned according to current medical practices, but problems sometimes occur. Call your caregiver if you have any problems or questions after your procedure. HOME CARE INSTRUCTIONS You may shower 24 hours after the procedure. Remove the bandage (dressing) and gently wash the site  with plain soap and water. Gently pat the site dry.  Do not apply powder or lotion to the site.  Do not sit in a bathtub, swimming pool, or whirlpool for 5 to 7 days.  No bending, squatting, or lifting anything over 10 pounds (4.5 kg) as directed by your caregiver.  Inspect the site at least twice daily.  Do not drive home if you are discharged the same day of the procedure. Have someone else drive you.  You may drive 24 hours after the procedure unless otherwise instructed by your caregiver.  What to expect: Any bruising will usually fade within 1 to 2 weeks.  Blood that collects in the tissue (hematoma) may be painful to the touch. It should usually decrease in size and tenderness within 1 to 2 weeks.  SEEK IMMEDIATE MEDICAL CARE IF: You have unusual pain at the groin site or down the affected  leg.  You have redness, warmth, swelling, or pain at the groin site.  You have drainage (other than a small amount of blood on the dressing).  You have chills.  You have a fever or persistent symptoms for more than 72 hours.  You have a fever and your symptoms suddenly get worse.  Your leg becomes pale, cool, tingly, or numb.  You have heavy bleeding from the site. Hold pressure on the site. Marland Kitchen  PLEASE DO NOT MISS ANY DOSES OF YOUR PLAVIX!!!!! Also keep a log of you blood pressures and bring back to your follow up appt. Please call the office with any questions.   Patients taking blood thinners should generally stay away from medicines like ibuprofen, Advil, Motrin, naproxen, and Aleve due to risk of stomach bleeding. You may take Tylenol as directed or talk to your primary doctor about alternatives.   Increase activity slowly   Complete by: As directed        Discharge Medications     Medication List    TAKE these medications   alendronate 70 MG tablet Commonly known as: FOSAMAX Take 1 tablet (70 mg total) by mouth once a week. What changed: when to take this   ALPRAZolam 1 MG tablet Commonly known as: XANAX TAKE 1 TABLET(1 MG) BY MOUTH AT BEDTIME AS NEEDED FOR ANXIETY What changed:   how much to take  how to take this  when to take this  additional instructions   aspirin EC 81 MG tablet Take 81 mg by mouth daily.   atorvastatin 40 MG tablet Commonly known as: LIPITOR Take 40 mg by mouth at bedtime.   clopidogrel 75 MG tablet Commonly known as: Plavix Take 1 tablet (75 mg total) by mouth daily.   Fish Oil 1000 MG Caps Take 1,000 mg by mouth daily.   MAGNESIUM PO Take 1 tablet by mouth daily.   metoprolol succinate 50 MG 24 hr tablet Commonly known as: TOPROL-XL Take 50 mg by mouth daily.   telmisartan 80 MG tablet Commonly known as: MICARDIS Take 1 tablet (80 mg total) by mouth daily.   vitamin C 250 MG tablet Commonly known as: ASCORBIC ACID Take  500 mg by mouth daily.   Vitamin D 50 MCG (2000 UT) Caps Take 1 capsule (2,000 Units total) by mouth daily. What changed: how much to take       Acute coronary syndrome (MI, NSTEMI, STEMI, etc) this admission?: No.     Outstanding Labs/Studies   N/a  Duration of Discharge Encounter   Greater than 30 minutes including physician time.  Signed, Reino Bellis NP-C 08/03/2019, 8:56 AM

## 2019-08-03 NOTE — Progress Notes (Signed)
Pt given discharge instructions. Education  given including but not limited to site care, meds, when to call MD & f/u appointments. Prescription for plavix to be sent ot the [facility] in [place]. All belongings gathered & sent with pt. Hoover Brunette, RN

## 2019-08-17 DIAGNOSIS — E782 Mixed hyperlipidemia: Secondary | ICD-10-CM | POA: Diagnosis not present

## 2019-08-17 DIAGNOSIS — I2510 Atherosclerotic heart disease of native coronary artery without angina pectoris: Secondary | ICD-10-CM | POA: Diagnosis not present

## 2019-08-17 DIAGNOSIS — I10 Essential (primary) hypertension: Secondary | ICD-10-CM | POA: Diagnosis not present

## 2019-08-23 ENCOUNTER — Encounter: Payer: PPO | Attending: Internal Medicine | Admitting: *Deleted

## 2019-08-23 ENCOUNTER — Other Ambulatory Visit: Payer: Self-pay

## 2019-08-23 DIAGNOSIS — Z955 Presence of coronary angioplasty implant and graft: Secondary | ICD-10-CM

## 2019-08-23 NOTE — Progress Notes (Signed)
Virtual initial orientation completed. Diagnosis can be found in [facility] 8/12. EP/RD Orientation scheduled 9/3 at 9:30

## 2019-08-24 ENCOUNTER — Encounter: Payer: PPO | Admitting: *Deleted

## 2019-08-24 VITALS — Ht 64.1 in | Wt 170.0 lb

## 2019-08-24 DIAGNOSIS — Z955 Presence of coronary angioplasty implant and graft: Secondary | ICD-10-CM

## 2019-08-24 NOTE — Progress Notes (Signed)
Cardiac Individual Treatment Plan  Patient Details  Name: Kelsey Carter MRN: [id number] Date of Birth: 09/08/1946 Referring Provider:     Cardiac Rehab from 08/24/2019 in [facility]  Referring Provider  Kowalski, Burce MD      Initial Encounter Date:    Cardiac Rehab from 08/24/2019 in [facility]  Date  08/24/19      Visit Diagnosis: Status post coronary artery stent placement  Patient's Home Medications on Admission:  Current Outpatient Medications:  .  alendronate (FOSAMAX) 70 MG tablet, Take 1 tablet (70 mg total) by mouth once a week. (Patient taking differently: Take 70 mg by mouth every Wednesday. ), Disp: 4 tablet, Rfl: 6 .  ALPRAZolam (XANAX) 1 MG tablet, TAKE 1 TABLET(1 MG) BY MOUTH AT BEDTIME AS NEEDED FOR ANXIETY (Patient taking differently: Take 1 mg by mouth at bedtime. ), Disp: 30 tablet, Rfl: 0 .  aspirin EC 81 MG tablet, Take 81 mg by mouth daily., Disp: , Rfl:  .  atorvastatin (LIPITOR) 40 MG tablet, Take 40 mg by mouth at bedtime., Disp: , Rfl:  .  Cholecalciferol (VITAMIN D) 2000 units CAPS, Take 1 capsule (2,000 Units total) by mouth daily. (Patient taking differently: Take 4,000 capsules by mouth daily. ), Disp: 30 capsule, Rfl:  .  clopidogrel (PLAVIX) 75 MG tablet, Take 1 tablet (75 mg total) by mouth daily., Disp: 90 tablet, Rfl: 2 .  MAGNESIUM PO, Take 1 tablet by mouth daily., Disp: , Rfl:  .  metoprolol succinate (TOPROL-XL) 50 MG 24 hr tablet, Take 50 mg by mouth daily. , Disp: , Rfl:  .  Omega-3 Fatty Acids (FISH OIL) 1000 MG CAPS, Take 1,000 mg by mouth daily., Disp: , Rfl:  .  telmisartan (MICARDIS) 80 MG tablet, Take 1 tablet (80 mg total) by mouth daily., Disp: 90 tablet, Rfl: 3 .  vitamin C (ASCORBIC ACID) 250 MG tablet, Take 500 mg by mouth daily., Disp: , Rfl:   Past Medical History: Past Medical History:  Diagnosis Date  . Anemia    vitamin d deficiency  . Anxiety   . Arthritis    back, DDD  .  Breast cancer (HCC)    bilateral mastectomies.  different types of cancer in each breast  . Cardiomyopathy due to chemotherapy (HCC) 2011  . Chronic pain 2019  . Complication of anesthesia   . Coronary artery disease   . GERD (gastroesophageal reflux disease)   . Hyperlipidemia   . Hypertension   . Lumbar herniated disc   . Peripheral vascular disease (HCC)    neuropathies in extremities due to chemotherapy  . Personal history of chemotherapy   . Personal history of radiation therapy   . PONV (postoperative nausea and vomiting)     Tobacco Use: Social History   Tobacco Use  Smoking Status Never Smoker  Smokeless Tobacco Never Used    Labs: Recent Review Flowsheet Data    Labs for ITP Cardiac and Pulmonary Rehab Latest Ref Rng & Units 04/24/2013 08/24/2017 03/22/2018 09/21/2018   Cholestrol 0 - 200 mg/dL 160 117 - 130   LDLCALC 0 - 99 mg/dL 87 48 - 59   HDL >40 mg/dL 54 53 - 49   Trlycerides <150 mg/dL 97 82 - 108   Hemoglobin A1c 4.8 - 5.6 % - - 5.9(H) 5.6       Exercise Target Goals: Exercise Program Goal: Individual exercise prescription set using results from initial 6 min walk test   and THRR while considering  patient's activity barriers and safety.   Exercise Prescription Goal: Initial exercise prescription builds to 30-45 minutes a day of aerobic activity, 2-3 days per week.  Home exercise guidelines will be given to patient during program as part of exercise prescription that the participant will acknowledge.  Activity Barriers & Risk Stratification: Activity Barriers & Cardiac Risk Stratification - 08/24/19 1034      Activity Barriers & Cardiac Risk Stratification   Activity Barriers  Other (comment);Balance Concerns;Back Problems;Muscular Weakness;Deconditioning;Shortness of Breath    Comments  neuropathy in hands and feet (post chemo), spinal stenosis    Cardiac Risk Stratification  Moderate       6 Minute Walk: 6 Minute Walk    Row Name 08/24/19 1033          6 Minute Walk   Phase  Initial     Distance  1170 feet     Walk Time  6 minutes     # of Rest Breaks  0     MPH  2.22     METS  2.43     RPE  1     Perceived Dyspnea   1     VO2 Peak  8.54     Symptoms  Yes (comment)     Comments  SOB     Resting HR  70 bpm     Resting BP  112/56     Resting Oxygen Saturation   97 %     Exercise Oxygen Saturation  during 6 min walk  95 %     Max Ex. HR  105 bpm     Max Ex. BP  134/70     2 Minute Post BP  130/64        Oxygen Initial Assessment:   Oxygen Re-Evaluation:   Oxygen Discharge (Final Oxygen Re-Evaluation):   Initial Exercise Prescription: Initial Exercise Prescription - 08/24/19 1000      Date of Initial Exercise RX and Referring Provider   Date  08/24/19    Referring Provider  Josephina Gip MD      Treadmill   MPH  1.9    Grade  0    Minutes  15    METs  2.45      Elliptical   Level  2    Speed  1    Minutes  15      REL-XR   Level  1    Speed  50    Minutes  15    METs  2      T5 Nustep   Level  1    SPM  80    Minutes  15    METs  2      Prescription Details   Frequency (times per week)  2    Duration  Progress to 30 minutes of continuous aerobic without signs/symptoms of physical distress      Intensity   THRR 40-80% of Max Heartrate  101-132    Ratings of Perceived Exertion  11-13    Perceived Dyspnea  0-4      Progression   Progression  Continue to progress workloads to maintain intensity without signs/symptoms of physical distress.      Resistance Training   Training Prescription  Yes    Weight  3lbs    Reps  10-15       Perform Capillary Blood Glucose checks as needed.  Exercise Prescription Changes: Exercise Prescription Changes  Row Name 08/24/19 1000             Response to Exercise   Blood Pressure (Admit)  112/56       Blood Pressure (Exercise)  134/70       Blood Pressure (Exit)  130/64       Heart Rate (Admit)  70 bpm       Heart Rate (Exercise)  105 bpm        Heart Rate (Exit)  74 bpm       Oxygen Saturation (Admit)  97 %       Oxygen Saturation (Exercise)  95 %       Rating of Perceived Exertion (Exercise)  11       Perceived Dyspnea (Exercise)  1       Symptoms  SOB       Comments  walk test results          Exercise Comments:   Exercise Goals and Review: Exercise Goals    Row Name 08/24/19 1039             Exercise Goals   Increase Physical Activity  Yes       Intervention  Provide advice, education, support and counseling about physical activity/exercise needs.;Develop an individualized exercise prescription for aerobic and resistive training based on initial evaluation findings, risk stratification, comorbidities and participant's personal goals.       Expected Outcomes  Short Term: Attend rehab on a regular basis to increase amount of physical activity.;Long Term: Add in home exercise to make exercise part of routine and to increase amount of physical activity.;Long Term: Exercising regularly at least 3-5 days a week.       Increase Strength and Stamina  Yes       Intervention  Provide advice, education, support and counseling about physical activity/exercise needs.;Develop an individualized exercise prescription for aerobic and resistive training based on initial evaluation findings, risk stratification, comorbidities and participant's personal goals.       Expected Outcomes  Short Term: Increase workloads from initial exercise prescription for resistance, speed, and METs.;Short Term: Perform resistance training exercises routinely during rehab and add in resistance training at home;Long Term: Improve cardiorespiratory fitness, muscular endurance and strength as measured by increased METs and functional capacity (6MWT)       Able to understand and use rate of perceived exertion (RPE) scale  Yes       Intervention  Provide education and explanation on how to use RPE scale       Expected Outcomes  Short Term: Able to use RPE  daily in rehab to express subjective intensity level;Long Term:  Able to use RPE to guide intensity level when exercising independently       Able to understand and use Dyspnea scale  Yes       Intervention  Provide education and explanation on how to use Dyspnea scale       Expected Outcomes  Short Term: Able to use Dyspnea scale daily in rehab to express subjective sense of shortness of breath during exertion;Long Term: Able to use Dyspnea scale to guide intensity level when exercising independently       Knowledge and understanding of Target Heart Rate Range (THRR)  Yes       Intervention  Provide education and explanation of THRR including how the numbers were predicted and where they are located for reference       Expected Outcomes  Short Term: Able to  state/look up THRR;Short Term: Able to use daily as guideline for intensity in rehab;Long Term: Able to use THRR to govern intensity when exercising independently       Able to check pulse independently  Yes       Intervention  Provide education and demonstration on how to check pulse in carotid and radial arteries.;Review the importance of being able to check your own pulse for safety during independent exercise       Expected Outcomes  Short Term: Able to explain why pulse checking is important during independent exercise;Long Term: Able to check pulse independently and accurately       Understanding of Exercise Prescription  Yes       Intervention  Provide education, explanation, and written materials on patient's individual exercise prescription       Expected Outcomes  Short Term: Able to explain program exercise prescription;Long Term: Able to explain home exercise prescription to exercise independently          Exercise Goals Re-Evaluation :   Discharge Exercise Prescription (Final Exercise Prescription Changes): Exercise Prescription Changes - 08/24/19 1000      Response to Exercise   Blood Pressure (Admit)  112/56    Blood  Pressure (Exercise)  134/70    Blood Pressure (Exit)  130/64    Heart Rate (Admit)  70 bpm    Heart Rate (Exercise)  105 bpm    Heart Rate (Exit)  74 bpm    Oxygen Saturation (Admit)  97 %    Oxygen Saturation (Exercise)  95 %    Rating of Perceived Exertion (Exercise)  11    Perceived Dyspnea (Exercise)  1    Symptoms  SOB    Comments  walk test results       Nutrition:  Target Goals: Understanding of nutrition guidelines, daily intake of sodium <1517m, cholesterol <2034m calories 30% from fat and 7% or less from saturated fats, daily to have 5 or more servings of fruits and vegetables.  Biometrics: Pre Biometrics - 08/24/19 1040      Pre Biometrics   Height  5' 4.1" (1.628 m)    Weight  170 lb (77.1 kg)    BMI (Calculated)  29.09    Single Leg Stand  1.56 seconds        Nutrition Therapy Plan and Nutrition Goals: Nutrition Therapy & Goals - 08/24/19 1034      Nutrition Therapy   Diet  Low Na, HH eating    Protein (specify units)  65g    Fiber  25 grams    Whole Grain Foods  3 servings    Saturated Fats  12 max. grams    Fruits and Vegetables  5 servings/day    Sodium  1.5 grams      Personal Nutrition Goals   Nutrition Goal  ST: add more protein, make sure eating enough kcal LT: get more energy and strength    Comments  Pt reports eating raisin bread toast with some butter (WG) or instant oatmeal (pt reports the lower sugar one) or yogurt (pt reports 60kcal fruit one). Dinner is usually chicken (breast or thigh and skin) with vegetables (occasionally regular pasta , but small amount). Discussed protein and kcal needs and that she doesn't seem to be getting enough especially not enough to support exercise and muscle building. Talked about maybe a snack before and after exercise (pt wants to do a kind bar before and greek yogurt after). Talked about healthy snacking and  how to balance snacks (fat and prtoein especially). Pt reports will sometimes have a yogurt in the  middle of the day and will add jello or pudding cup if she feels like she isn't getting enough. Discussed HH eating and weight loss vs healthy outcomes. Discussed that we will review energy and strength levels during program to ensure she is getting enough.      Intervention Plan   Intervention  Prescribe, educate and counsel regarding individualized specific dietary modifications aiming towards targeted core components such as weight, hypertension, lipid management, diabetes, heart failure and other comorbidities.;Nutrition handout(s) given to patient.    Expected Outcomes  Short Term Goal: Understand basic principles of dietary content, such as calories, fat, sodium, cholesterol and nutrients.;Short Term Goal: A plan has been developed with personal nutrition goals set during dietitian appointment.;Long Term Goal: Adherence to prescribed nutrition plan.       Nutrition Assessments:   Nutrition Goals Re-Evaluation:   Nutrition Goals Discharge (Final Nutrition Goals Re-Evaluation):   Psychosocial: Target Goals: Acknowledge presence or absence of significant depression and/or stress, maximize coping skills, provide positive support system. Participant is able to verbalize types and ability to use techniques and skills needed for reducing stress and depression.   Initial Review & Psychosocial Screening: Initial Psych Review & Screening - 08/23/19 1134      Initial Review   Current issues with  Current Sleep Concerns   sleepy throughout the day     Family Dynamics   Good Support System?  Yes      Barriers   Psychosocial barriers to participate in program  There are no identifiable barriers or psychosocial needs.;The patient should benefit from training in stress management and relaxation.      Screening Interventions   Interventions  Encouraged to exercise;To provide support and resources with identified psychosocial needs;Provide feedback about the scores to participant    Expected  Outcomes  Short Term goal: Utilizing psychosocial counselor, staff and physician to assist with identification of specific Stressors or current issues interfering with healing process. Setting desired goal for each stressor or current issue identified.;Long Term Goal: Stressors or current issues are controlled or eliminated.;Short Term goal: Identification and review with participant of any Quality of Life or Depression concerns found by scoring the questionnaire.;Long Term goal: The participant improves quality of Life and PHQ9 Scores as seen by post scores and/or verbalization of changes       Quality of Life Scores:  Quality of Life - 08/24/19 1058      Quality of Life   Select  Quality of Life      Quality of Life Scores   Health/Function Pre  22.96 %    Socioeconomic Pre  27.14 %    Psych/Spiritual Pre  27.86 %    Family Pre  26.4 %    GLOBAL Pre  25.41 %      Scores of 19 and below usually indicate a poorer quality of life in these areas.  A difference of  2-3 points is a clinically meaningful difference.  A difference of 2-3 points in the total score of the Quality of Life Index has been associated with significant improvement in overall quality of life, self-image, physical symptoms, and general health in studies assessing change in quality of life.  PHQ-9: Recent Review Flowsheet Data    Depression screen [facility] 2/9 08/24/2019 09/21/2018 03/31/2018 03/03/2018 03/02/2018   Decreased Interest 0 0 0 0 0   Down, Depressed, Hopeless 0 0 0  0 0   PHQ - 2 Score 0 0 0 0 0   Altered sleeping 0 0 0 - -   Tired, decreased energy 1 0 0 - -   Change in appetite 0 0 0 - -   Feeling bad or failure about yourself  0 0 0 - -   Trouble concentrating 0 0 0 - -   Moving slowly or fidgety/restless 0 0 0 - -   Suicidal thoughts 0 0 0 - -   PHQ-9 Score 1 0 0 - -   Difficult doing work/chores Not difficult at all - Not difficult at all - -     Interpretation of Total Score  Total Score Depression  Severity:  1-4 = Minimal depression, 5-9 = Mild depression, 10-14 = Moderate depression, 15-19 = Moderately severe depression, 20-27 = Severe depression   Psychosocial Evaluation and Intervention:   Psychosocial Re-Evaluation:   Psychosocial Discharge (Final Psychosocial Re-Evaluation):   Vocational Rehabilitation: Provide vocational rehab assistance to qualifying candidates.   Vocational Rehab Evaluation & Intervention: Vocational Rehab - 08/23/19 1136      Initial Vocational Rehab Evaluation & Intervention   Assessment shows need for Vocational Rehabilitation  No       Education: Education Goals: Education classes will be provided on a variety of topics geared toward better understanding of heart health and risk factor modification. Participant will state understanding/return demonstration of topics presented as noted by education test scores.  Learning Barriers/Preferences: Learning Barriers/Preferences - 08/23/19 1136      Learning Barriers/Preferences   Learning Barriers  None    Learning Preferences  None       Education Topics:  AED/CPR: - Group verbal and written instruction with the use of models to demonstrate the basic use of the AED with the basic ABC's of resuscitation.   General Nutrition Guidelines/Fats and Fiber: -Group instruction provided by verbal, written material, models and posters to present the general guidelines for heart healthy nutrition. Gives an explanation and review of dietary fats and fiber.   Controlling Sodium/Reading Food Labels: -Group verbal and written material supporting the discussion of sodium use in heart healthy nutrition. Review and explanation with models, verbal and written materials for utilization of the food label.   Exercise Physiology & General Exercise Guidelines: - Group verbal and written instruction with models to review the exercise physiology of the cardiovascular system and associated critical values. Provides  general exercise guidelines with specific guidelines to those with heart or lung disease.    Aerobic Exercise & Resistance Training: - Gives group verbal and written instruction on the various components of exercise. Focuses on aerobic and resistive training programs and the benefits of this training and how to safely progress through these programs..   Flexibility, Balance, Mind/Body Relaxation: Provides group verbal/written instruction on the benefits of flexibility and balance training, including mind/body exercise modes such as yoga, pilates and tai chi.  Demonstration and skill practice provided.   Stress and Anxiety: - Provides group verbal and written instruction about the health risks of elevated stress and causes of high stress.  Discuss the correlation between heart/lung disease and anxiety and treatment options. Review healthy ways to manage with stress and anxiety.   Depression: - Provides group verbal and written instruction on the correlation between heart/lung disease and depressed mood, treatment options, and the stigmas associated with seeking treatment.   Anatomy & Physiology of the Heart: - Group verbal and written instruction and models provide basic cardiac anatomy  and physiology, with the coronary electrical and arterial systems. Review of Valvular disease and Heart Failure   Cardiac Procedures: - Group verbal and written instruction to review commonly prescribed medications for heart disease. Reviews the medication, class of the drug, and side effects. Includes the steps to properly store meds and maintain the prescription regimen. (beta blockers and nitrates)   Cardiac Medications I: - Group verbal and written instruction to review commonly prescribed medications for heart disease. Reviews the medication, class of the drug, and side effects. Includes the steps to properly store meds and maintain the prescription regimen.   Cardiac Medications II: -Group verbal  and written instruction to review commonly prescribed medications for heart disease. Reviews the medication, class of the drug, and side effects. (all other drug classes)    Go Sex-Intimacy & Heart Disease, Get SMART - Goal Setting: - Group verbal and written instruction through game format to discuss heart disease and the return to sexual intimacy. Provides group verbal and written material to discuss and apply goal setting through the application of the S.M.A.R.T. Method.   Other Matters of the Heart: - Provides group verbal, written materials and models to describe Stable Angina and Peripheral Artery. Includes description of the disease process and treatment options available to the cardiac patient.   Exercise & Equipment Safety: - Individual verbal instruction and demonstration of equipment use and safety with use of the equipment.   Cardiac Rehab from 08/24/2019 in [facility]  Date  08/24/19  Educator  [facility]  Instruction Review Code  1- Verbalizes Understanding      Infection Prevention: - Provides verbal and written material to individual with discussion of infection control including proper hand washing and proper equipment cleaning during exercise session.   Cardiac Rehab from 08/24/2019 in [facility] Cardiac and Pulmonary Rehab  Date  08/24/19  Educator  [facility]  Instruction Review Code  1- Verbalizes Understanding      Falls Prevention: - Provides verbal and written material to individual with discussion of falls prevention and safety.   Cardiac Rehab from 08/24/2019 in [facility] Re[facility]  Date  08/24/19  Educator  [facility]  Instruction Review Code  1- Verbalizes Understanding      Diabetes: - Individual verbal and written instruction to review signs/symptoms of diabetes, desired ranges of glucose level fasting, after meals and with exercise. Acknowledge that pre and post exercise glucose checks will be done for 3 sessions at entry of program.   Know  Your Numbers and Risk Factors: -Group verbal and written instruction about important numbers in your health.  Discussion of what are risk factors and how they play a role in the disease process.  Review of Cholesterol, Blood Pressure, Diabetes, and BMI and the role they play in your overall health.   Sleep Hygiene: -Provides group verbal and written instruction about how sleep can affect your health.  Define sleep hygiene, discuss sleep cycles and impact of sleep habits. Review good sleep hygiene tips.    Other: -Provides group and verbal instruction on various topics (see comments)   Knowledge Questionnaire Score: Knowledge Questionnaire Score - 08/24/19 1057      Knowledge Questionnaire Score   Pre Score  19/26 Education Focus: Angina, Sex, HF, Nutrition, Exercise       Core Components/Risk Factors/Patient Goals at Admission: Personal Goals and Risk Factors at Admission - 08/24/19 1041      Core Components/Risk Factors/Patient Goals on Admission    Weight Management  Yes;Weight Loss    Intervention  Weight Management: Develop a combined nutrition and exercise program designed to reach desired caloric intake, while maintaining appropriate intake of nutrient and fiber, sodium and fats, and appropriate energy expenditure required for the weight goal.;Weight Management: Provide education and appropriate resources to help participant work on and attain dietary goals.;Weight Management/Obesity: Establish reasonable short term and long term weight goals.    Admit Weight  170 lb (77.1 kg)    Goal Weight: Short Term  165 lb (74.8 kg)    Goal Weight: Long Term  160 lb (72.6 kg)    Expected Outcomes  Short Term: Continue to assess and modify interventions until short term weight is achieved;Long Term: Adherence to nutrition and physical activity/exercise program aimed toward attainment of established weight goal;Weight Loss: Understanding of general recommendations for a balanced deficit meal  plan, which promotes 1-2 lb weight loss per week and includes a negative energy balance of [phone] kcal/d;Understanding recommendations for meals to include 15-35% energy as protein, 25-35% energy from fat, 35-60% energy from carbohydrates, less than 219m of dietary cholesterol, 20-35 gm of total fiber daily;Understanding of distribution of calorie intake throughout the day with the consumption of 4-5 meals/snacks    Improve shortness of breath with ADL's  Yes    Intervention  Provide education, individualized exercise plan and daily activity instruction to help decrease symptoms of SOB with activities of daily living.    Expected Outcomes  Short Term: Improve cardiorespiratory fitness to achieve a reduction of symptoms when performing ADLs;Long Term: Be able to perform more ADLs without symptoms or delay the onset of symptoms    Hypertension  Yes    Intervention  Provide education on lifestyle modifcations including regular physical activity/exercise, weight management, moderate sodium restriction and increased consumption of fresh fruit, vegetables, and low fat dairy, alcohol moderation, and smoking cessation.;Monitor prescription use compliance.    Expected Outcomes  Short Term: Continued assessment and intervention until BP is < 140/963mHG in hypertensive participants. < 130/8019mG in hypertensive participants with diabetes, heart failure or chronic kidney disease.;Long Term: Maintenance of blood pressure at goal levels.    Lipids  Yes    Intervention  Provide education and support for participant on nutrition & aerobic/resistive exercise along with prescribed medications to achieve LDL <77m68mDL >40mg65m Expected Outcomes  Short Term: Participant states understanding of desired cholesterol values and is compliant with medications prescribed. Participant is following exercise prescription and nutrition guidelines.;Long Term: Cholesterol controlled with medications as prescribed, with  individualized exercise RX and with personalized nutrition plan. Value goals: LDL < 77mg,110m > 40 mg.       Core Components/Risk Factors/Patient Goals Review:    Core Components/Risk Factors/Patient Goals at Discharge (Final Review):    ITP Comments: ITP Comments    Row Name 08/23/19 1138 08/24/19 1032         ITP Comments  Virtual initial orientation completed. Diagnosis can be found in [facility] EP/RD Orientation scheduled 9/3 at 9:30  Completed 6MWT, gym orientation, and RD evaluation. Initial ITP created and sent for review to Dr. Mark MEmily Filbertcal Director.         Comments: Initial ITP

## 2019-08-24 NOTE — Patient Instructions (Signed)
Patient Instructions  Patient Details  Name: Kelsey Carter MRN: [id number] Date of Birth: 1946-12-08 Referring Provider:  Corey Skains, MD  Below are your personal goals for exercise, nutrition, and risk factors. Our goal is to help you stay on track towards obtaining and maintaining these goals. We will be discussing your progress on these goals with you throughout the program.  Initial Exercise Prescription: Initial Exercise Prescription - 08/24/19 1000      Date of Initial Exercise RX and Referring Provider   Date  08/24/19    Referring Provider  Josephina Gip MD      Treadmill   MPH  1.9    Grade  0    Minutes  15    METs  2.45      Elliptical   Level  2    Speed  1    Minutes  15      REL-XR   Level  1    Speed  50    Minutes  15    METs  2      T5 Nustep   Level  1    SPM  80    Minutes  15    METs  2      Prescription Details   Frequency (times per week)  2    Duration  Progress to 30 minutes of continuous aerobic without signs/symptoms of physical distress      Intensity   THRR 40-80% of Max Heartrate  101-132    Ratings of Perceived Exertion  11-13    Perceived Dyspnea  0-4      Progression   Progression  Continue to progress workloads to maintain intensity without signs/symptoms of physical distress.      Resistance Training   Training Prescription  Yes    Weight  3lbs    Reps  10-15       Exercise Goals: Frequency: Be able to perform aerobic exercise two to three times per week in program working toward 2-5 days per week of home exercise.  Intensity: Work with a perceived exertion of 11 (fairly light) - 15 (hard) while following your exercise prescription.  We will make changes to your prescription with you as you progress through the program.   Duration: Be able to do 30 to 45 minutes of continuous aerobic exercise in addition to a 5 minute warm-up and a 5 minute cool-down routine.   Nutrition Goals: Your personal nutrition  goals will be established when you do your nutrition analysis with the dietician.  The following are general nutrition guidelines to follow: Cholesterol < 200mg /day Sodium < 1500mg /day Fiber: Women over 50 yrs - 21 grams per day  Personal Goals: Personal Goals and Risk Factors at Admission - 08/24/19 1041      Core Components/Risk Factors/Patient Goals on Admission    Weight Management  Yes;Weight Loss    Intervention  Weight Management: Develop a combined nutrition and exercise program designed to reach desired caloric intake, while maintaining appropriate intake of nutrient and fiber, sodium and fats, and appropriate energy expenditure required for the weight goal.;Weight Management: Provide education and appropriate resources to help participant work on and attain dietary goals.;Weight Management/Obesity: Establish reasonable short term and long term weight goals.    Admit Weight  170 lb (77.1 kg)    Goal Weight: Short Term  165 lb (74.8 kg)    Goal Weight: Long Term  160 lb (72.6 kg)    Expected Outcomes  Short Term: Continue to assess and modify interventions until short term weight is achieved;Long Term: Adherence to nutrition and physical activity/exercise program aimed toward attainment of established weight goal;Weight Loss: Understanding of general recommendations for a balanced deficit meal plan, which promotes 1-2 lb weight loss per week and includes a negative energy balance of [phone] kcal/d;Understanding recommendations for meals to include 15-35% energy as protein, 25-35% energy from fat, 35-60% energy from carbohydrates, less than 200mg  of dietary cholesterol, 20-35 gm of total fiber daily;Understanding of distribution of calorie intake throughout the day with the consumption of 4-5 meals/snacks    Improve shortness of breath with ADL's  Yes    Intervention  Provide education, individualized exercise plan and daily activity instruction to help decrease symptoms of SOB with  activities of daily living.    Expected Outcomes  Short Term: Improve cardiorespiratory fitness to achieve a reduction of symptoms when performing ADLs;Long Term: Be able to perform more ADLs without symptoms or delay the onset of symptoms    Hypertension  Yes    Intervention  Provide education on lifestyle modifcations including regular physical activity/exercise, weight management, moderate sodium restriction and increased consumption of fresh fruit, vegetables, and low fat dairy, alcohol moderation, and smoking cessation.;Monitor prescription use compliance.    Expected Outcomes  Short Term: Continued assessment and intervention until BP is < 140/74mm HG in hypertensive participants. < 130/68mm HG in hypertensive participants with diabetes, heart failure or chronic kidney disease.;Long Term: Maintenance of blood pressure at goal levels.    Lipids  Yes    Intervention  Provide education and support for participant on nutrition & aerobic/resistive exercise along with prescribed medications to achieve LDL 70mg , HDL >40mg .    Expected Outcomes  Short Term: Participant states understanding of desired cholesterol values and is compliant with medications prescribed. Participant is following exercise prescription and nutrition guidelines.;Long Term: Cholesterol controlled with medications as prescribed, with individualized exercise RX and with personalized nutrition plan. Value goals: LDL < 70mg , HDL > 40 mg.       Tobacco Use Initial Evaluation: Social History   Tobacco Use  Smoking Status Never Smoker  Smokeless Tobacco Never Used    Exercise Goals and Review: Exercise Goals    Row Name 08/24/19 1039             Exercise Goals   Increase Physical Activity  Yes       Intervention  Provide advice, education, support and counseling about physical activity/exercise needs.;Develop an individualized exercise prescription for aerobic and resistive training based on initial evaluation findings,  risk stratification, comorbidities and participant's personal goals.       Expected Outcomes  Short Term: Attend rehab on a regular basis to increase amount of physical activity.;Long Term: Add in home exercise to make exercise part of routine and to increase amount of physical activity.;Long Term: Exercising regularly at least 3-5 days a week.       Increase Strength and Stamina  Yes       Intervention  Provide advice, education, support and counseling about physical activity/exercise needs.;Develop an individualized exercise prescription for aerobic and resistive training based on initial evaluation findings, risk stratification, comorbidities and participant's personal goals.       Expected Outcomes  Short Term: Increase workloads from initial exercise prescription for resistance, speed, and METs.;Short Term: Perform resistance training exercises routinely during rehab and add in resistance training at home;Long Term: Improve cardiorespiratory fitness, muscular endurance and strength as measured by increased METs and  functional capacity (6MWT)       Able to understand and use rate of perceived exertion (RPE) scale  Yes       Intervention  Provide education and explanation on how to use RPE scale       Expected Outcomes  Short Term: Able to use RPE daily in rehab to express subjective intensity level;Long Term:  Able to use RPE to guide intensity level when exercising independently       Able to understand and use Dyspnea scale  Yes       Intervention  Provide education and explanation on how to use Dyspnea scale       Expected Outcomes  Short Term: Able to use Dyspnea scale daily in rehab to express subjective sense of shortness of breath during exertion;Long Term: Able to use Dyspnea scale to guide intensity level when exercising independently       Knowledge and understanding of Target Heart Rate Range (THRR)  Yes       Intervention  Provide education and explanation of THRR including how the  numbers were predicted and where they are located for reference       Expected Outcomes  Short Term: Able to state/look up THRR;Short Term: Able to use daily as guideline for intensity in rehab;Long Term: Able to use THRR to govern intensity when exercising independently       Able to check pulse independently  Yes       Intervention  Provide education and demonstration on how to check pulse in carotid and radial arteries.;Review the importance of being able to check your own pulse for safety during independent exercise       Expected Outcomes  Short Term: Able to explain why pulse checking is important during independent exercise;Long Term: Able to check pulse independently and accurately       Understanding of Exercise Prescription  Yes       Intervention  Provide education, explanation, and written materials on patient's individual exercise prescription       Expected Outcomes  Short Term: Able to explain program exercise prescription;Long Term: Able to explain home exercise prescription to exercise independently          Copy of goals given to participant.

## 2019-08-30 ENCOUNTER — Encounter: Payer: Self-pay | Admitting: *Deleted

## 2019-08-30 ENCOUNTER — Encounter: Payer: PPO | Admitting: *Deleted

## 2019-08-30 ENCOUNTER — Other Ambulatory Visit: Payer: Self-pay

## 2019-08-30 DIAGNOSIS — Z955 Presence of coronary angioplasty implant and graft: Secondary | ICD-10-CM

## 2019-08-30 NOTE — Progress Notes (Signed)
Daily Session Note  Patient Details  Name: Kelsey Carter MRN: [id number] Date of Birth: [birth year] Referring Provider:     Cardiac Rehab from 08/24/2019 in [facility]  Referring Provider  Josephina Gip MD      Encounter Date: 08/30/2019  Check In: Session Check In - 08/30/19 1049      Check-In   Supervising physician immediately available to respond to emergencies  See telemetry face sheet for immediately available ER MD    Location  [facility]-Cardiac & Pulmonary Rehab    Staff Present  Heath Lark, RN, BSN, CCRP;Joseph Hood RCP,RRT,BSRT;Jessica [place], [place], [place], [place], CCET    Virtual Visit  No    Medication changes reported      No    Fall or balance concerns reported     No    Warm-up and Cool-down  Performed on first and last piece of equipment    Resistance Training Performed  Yes    VAD Patient?  No    PAD/SET Patient?  No      Pain Assessment   Currently in Pain?  No/denies          Social History   Tobacco Use  Smoking Status Never Smoker  Smokeless Tobacco Never Used    Goals Met:  Exercise tolerated well Personal goals reviewed No report of cardiac concerns or symptoms Strength training completed today  Goals Unmet:  Not Applicable  Comments: First full day of exercise!  Patient was oriented to gym and equipment including functions, settings, policies, and procedures.  Patient's individual exercise prescription and treatment plan were reviewed.  All starting workloads were established based on the results of the 6 minute walk test done at initial orientation visit.  The plan for exercise progression was also introduced and progression will be customized based on patient's performance and goals.    Dr. Emily Filbert is Medical Director for [place] and [facility].

## 2019-08-30 NOTE — Progress Notes (Signed)
Cardiac Individual Treatment Plan  Patient Details  Name: Kelsey Carter MRN: [id number] Date of Birth: 06/16/1946 Referring Provider:     Cardiac Rehab from 08/24/2019 in [facility]  Referring Provider  Kowalski, Burce MD      Initial Encounter Date:    Cardiac Rehab from 08/24/2019 in [facility]  Date  08/24/19      Visit Diagnosis: Status post coronary artery stent placement  Patient's Home Medications on Admission:  Current Outpatient Medications:  .  alendronate (FOSAMAX) 70 MG tablet, Take 1 tablet (70 mg total) by mouth once a week. (Patient taking differently: Take 70 mg by mouth every Wednesday. ), Disp: 4 tablet, Rfl: 6 .  ALPRAZolam (XANAX) 1 MG tablet, TAKE 1 TABLET(1 MG) BY MOUTH AT BEDTIME AS NEEDED FOR ANXIETY (Patient taking differently: Take 1 mg by mouth at bedtime. ), Disp: 30 tablet, Rfl: 0 .  aspirin EC 81 MG tablet, Take 81 mg by mouth daily., Disp: , Rfl:  .  atorvastatin (LIPITOR) 40 MG tablet, Take 40 mg by mouth at bedtime., Disp: , Rfl:  .  Cholecalciferol (VITAMIN D) 2000 units CAPS, Take 1 capsule (2,000 Units total) by mouth daily. (Patient taking differently: Take 4,000 capsules by mouth daily. ), Disp: 30 capsule, Rfl:  .  clopidogrel (PLAVIX) 75 MG tablet, Take 1 tablet (75 mg total) by mouth daily., Disp: 90 tablet, Rfl: 2 .  MAGNESIUM PO, Take 1 tablet by mouth daily., Disp: , Rfl:  .  metoprolol succinate (TOPROL-XL) 50 MG 24 hr tablet, Take 50 mg by mouth daily. , Disp: , Rfl:  .  Omega-3 Fatty Acids (FISH OIL) 1000 MG CAPS, Take 1,000 mg by mouth daily., Disp: , Rfl:  .  telmisartan (MICARDIS) 80 MG tablet, Take 1 tablet (80 mg total) by mouth daily., Disp: 90 tablet, Rfl: 3 .  vitamin C (ASCORBIC ACID) 250 MG tablet, Take 500 mg by mouth daily., Disp: , Rfl:   Past Medical History: Past Medical History:  Diagnosis Date  . Anemia    vitamin d deficiency  . Anxiety   . Arthritis    back, DDD  .  Breast cancer (HCC)    bilateral mastectomies.  different types of cancer in each breast  . Cardiomyopathy due to chemotherapy (HCC) 2011  . Chronic pain 2019  . Complication of anesthesia   . Coronary artery disease   . GERD (gastroesophageal reflux disease)   . Hyperlipidemia   . Hypertension   . Lumbar herniated disc   . Peripheral vascular disease (HCC)    neuropathies in extremities due to chemotherapy  . Personal history of chemotherapy   . Personal history of radiation therapy   . PONV (postoperative nausea and vomiting)     Tobacco Use: Social History   Tobacco Use  Smoking Status Never Smoker  Smokeless Tobacco Never Used    Labs: Recent Review Flowsheet Data    Labs for ITP Cardiac and Pulmonary Rehab Latest Ref Rng & Units 04/24/2013 08/24/2017 03/22/2018 09/21/2018   Cholestrol 0 - 200 mg/dL 160 117 - 130   LDLCALC 0 - 99 mg/dL 87 48 - 59   HDL >40 mg/dL 54 53 - 49   Trlycerides <150 mg/dL 97 82 - 108   Hemoglobin A1c 4.8 - 5.6 % - - 5.9(H) 5.6       Exercise Target Goals: Exercise Program Goal: Individual exercise prescription set using results from initial 6 min walk test   and THRR while considering  patient's activity barriers and safety.   Exercise Prescription Goal: Initial exercise prescription builds to 30-45 minutes a day of aerobic activity, 2-3 days per week.  Home exercise guidelines will be given to patient during program as part of exercise prescription that the participant will acknowledge.  Activity Barriers & Risk Stratification: Activity Barriers & Cardiac Risk Stratification - 08/24/19 1034      Activity Barriers & Cardiac Risk Stratification   Activity Barriers  Other (comment);Balance Concerns;Back Problems;Muscular Weakness;Deconditioning;Shortness of Breath    Comments  neuropathy in hands and feet (post chemo), spinal stenosis    Cardiac Risk Stratification  Moderate       6 Minute Walk: 6 Minute Walk    Row Name 08/24/19 1033          6 Minute Walk   Phase  Initial     Distance  1170 feet     Walk Time  6 minutes     # of Rest Breaks  0     MPH  2.22     METS  2.43     RPE  1     Perceived Dyspnea   1     VO2 Peak  8.54     Symptoms  Yes (comment)     Comments  SOB     Resting HR  70 bpm     Resting BP  112/56     Resting Oxygen Saturation   97 %     Exercise Oxygen Saturation  during 6 min walk  95 %     Max Ex. HR  105 bpm     Max Ex. BP  134/70     2 Minute Post BP  130/64        Oxygen Initial Assessment:   Oxygen Re-Evaluation:   Oxygen Discharge (Final Oxygen Re-Evaluation):   Initial Exercise Prescription: Initial Exercise Prescription - 08/24/19 1000      Date of Initial Exercise RX and Referring Provider   Date  08/24/19    Referring Provider  Josephina Gip MD      Treadmill   MPH  1.9    Grade  0    Minutes  15    METs  2.45      Elliptical   Level  2    Speed  1    Minutes  15      REL-XR   Level  1    Speed  50    Minutes  15    METs  2      T5 Nustep   Level  1    SPM  80    Minutes  15    METs  2      Prescription Details   Frequency (times per week)  2    Duration  Progress to 30 minutes of continuous aerobic without signs/symptoms of physical distress      Intensity   THRR 40-80% of Max Heartrate  101-132    Ratings of Perceived Exertion  11-13    Perceived Dyspnea  0-4      Progression   Progression  Continue to progress workloads to maintain intensity without signs/symptoms of physical distress.      Resistance Training   Training Prescription  Yes    Weight  3lbs    Reps  10-15       Perform Capillary Blood Glucose checks as needed.  Exercise Prescription Changes: Exercise Prescription Changes  Row Name 08/24/19 1000             Response to Exercise   Blood Pressure (Admit)  112/56       Blood Pressure (Exercise)  134/70       Blood Pressure (Exit)  130/64       Heart Rate (Admit)  70 bpm       Heart Rate (Exercise)  105 bpm        Heart Rate (Exit)  74 bpm       Oxygen Saturation (Admit)  97 %       Oxygen Saturation (Exercise)  95 %       Rating of Perceived Exertion (Exercise)  11       Perceived Dyspnea (Exercise)  1       Symptoms  SOB       Comments  walk test results          Exercise Comments:   Exercise Goals and Review: Exercise Goals    Row Name 08/24/19 1039             Exercise Goals   Increase Physical Activity  Yes       Intervention  Provide advice, education, support and counseling about physical activity/exercise needs.;Develop an individualized exercise prescription for aerobic and resistive training based on initial evaluation findings, risk stratification, comorbidities and participant's personal goals.       Expected Outcomes  Short Term: Attend rehab on a regular basis to increase amount of physical activity.;Long Term: Add in home exercise to make exercise part of routine and to increase amount of physical activity.;Long Term: Exercising regularly at least 3-5 days a week.       Increase Strength and Stamina  Yes       Intervention  Provide advice, education, support and counseling about physical activity/exercise needs.;Develop an individualized exercise prescription for aerobic and resistive training based on initial evaluation findings, risk stratification, comorbidities and participant's personal goals.       Expected Outcomes  Short Term: Increase workloads from initial exercise prescription for resistance, speed, and METs.;Short Term: Perform resistance training exercises routinely during rehab and add in resistance training at home;Long Term: Improve cardiorespiratory fitness, muscular endurance and strength as measured by increased METs and functional capacity (6MWT)       Able to understand and use rate of perceived exertion (RPE) scale  Yes       Intervention  Provide education and explanation on how to use RPE scale       Expected Outcomes  Short Term: Able to use RPE  daily in rehab to express subjective intensity level;Long Term:  Able to use RPE to guide intensity level when exercising independently       Able to understand and use Dyspnea scale  Yes       Intervention  Provide education and explanation on how to use Dyspnea scale       Expected Outcomes  Short Term: Able to use Dyspnea scale daily in rehab to express subjective sense of shortness of breath during exertion;Long Term: Able to use Dyspnea scale to guide intensity level when exercising independently       Knowledge and understanding of Target Heart Rate Range (THRR)  Yes       Intervention  Provide education and explanation of THRR including how the numbers were predicted and where they are located for reference       Expected Outcomes  Short Term: Able to  state/look up THRR;Short Term: Able to use daily as guideline for intensity in rehab;Long Term: Able to use THRR to govern intensity when exercising independently       Able to check pulse independently  Yes       Intervention  Provide education and demonstration on how to check pulse in carotid and radial arteries.;Review the importance of being able to check your own pulse for safety during independent exercise       Expected Outcomes  Short Term: Able to explain why pulse checking is important during independent exercise;Long Term: Able to check pulse independently and accurately       Understanding of Exercise Prescription  Yes       Intervention  Provide education, explanation, and written materials on patient's individual exercise prescription       Expected Outcomes  Short Term: Able to explain program exercise prescription;Long Term: Able to explain home exercise prescription to exercise independently          Exercise Goals Re-Evaluation :   Discharge Exercise Prescription (Final Exercise Prescription Changes): Exercise Prescription Changes - 08/24/19 1000      Response to Exercise   Blood Pressure (Admit)  112/56    Blood  Pressure (Exercise)  134/70    Blood Pressure (Exit)  130/64    Heart Rate (Admit)  70 bpm    Heart Rate (Exercise)  105 bpm    Heart Rate (Exit)  74 bpm    Oxygen Saturation (Admit)  97 %    Oxygen Saturation (Exercise)  95 %    Rating of Perceived Exertion (Exercise)  11    Perceived Dyspnea (Exercise)  1    Symptoms  SOB    Comments  walk test results       Nutrition:  Target Goals: Understanding of nutrition guidelines, daily intake of sodium <1517m, cholesterol <2034m calories 30% from fat and 7% or less from saturated fats, daily to have 5 or more servings of fruits and vegetables.  Biometrics: Pre Biometrics - 08/24/19 1040      Pre Biometrics   Height  5' 4.1" (1.628 m)    Weight  170 lb (77.1 kg)    BMI (Calculated)  29.09    Single Leg Stand  1.56 seconds        Nutrition Therapy Plan and Nutrition Goals: Nutrition Therapy & Goals - 08/24/19 1034      Nutrition Therapy   Diet  Low Na, HH eating    Protein (specify units)  65g    Fiber  25 grams    Whole Grain Foods  3 servings    Saturated Fats  12 max. grams    Fruits and Vegetables  5 servings/day    Sodium  1.5 grams      Personal Nutrition Goals   Nutrition Goal  ST: add more protein, make sure eating enough kcal LT: get more energy and strength    Comments  Pt reports eating raisin bread toast with some butter (WG) or instant oatmeal (pt reports the lower sugar one) or yogurt (pt reports 60kcal fruit one). Dinner is usually chicken (breast or thigh and skin) with vegetables (occasionally regular pasta , but small amount). Discussed protein and kcal needs and that she doesn't seem to be getting enough especially not enough to support exercise and muscle building. Talked about maybe a snack before and after exercise (pt wants to do a kind bar before and greek yogurt after). Talked about healthy snacking and  how to balance snacks (fat and prtoein especially). Pt reports will sometimes have a yogurt in the  middle of the day and will add jello or pudding cup if she feels like she isn't getting enough. Discussed HH eating and weight loss vs healthy outcomes. Discussed that we will review energy and strength levels during program to ensure she is getting enough.      Intervention Plan   Intervention  Prescribe, educate and counsel regarding individualized specific dietary modifications aiming towards targeted core components such as weight, hypertension, lipid management, diabetes, heart failure and other comorbidities.;Nutrition handout(s) given to patient.    Expected Outcomes  Short Term Goal: Understand basic principles of dietary content, such as calories, fat, sodium, cholesterol and nutrients.;Short Term Goal: A plan has been developed with personal nutrition goals set during dietitian appointment.;Long Term Goal: Adherence to prescribed nutrition plan.       Nutrition Assessments: Nutrition Assessments - 08/24/19 1314      MEDFICTS Scores   Pre Score  15       Nutrition Goals Re-Evaluation:   Nutrition Goals Discharge (Final Nutrition Goals Re-Evaluation):   Psychosocial: Target Goals: Acknowledge presence or absence of significant depression and/or stress, maximize coping skills, provide positive support system. Participant is able to verbalize types and ability to use techniques and skills needed for reducing stress and depression.   Initial Review & Psychosocial Screening: Initial Psych Review & Screening - 08/23/19 1134      Initial Review   Current issues with  Current Sleep Concerns   sleepy throughout the day     Family Dynamics   Good Support System?  Yes      Barriers   Psychosocial barriers to participate in program  There are no identifiable barriers or psychosocial needs.;The patient should benefit from training in stress management and relaxation.      Screening Interventions   Interventions  Encouraged to exercise;To provide support and resources with  identified psychosocial needs;Provide feedback about the scores to participant    Expected Outcomes  Short Term goal: Utilizing psychosocial counselor, staff and physician to assist with identification of specific Stressors or current issues interfering with healing process. Setting desired goal for each stressor or current issue identified.;Long Term Goal: Stressors or current issues are controlled or eliminated.;Short Term goal: Identification and review with participant of any Quality of Life or Depression concerns found by scoring the questionnaire.;Long Term goal: The participant improves quality of Life and PHQ9 Scores as seen by post scores and/or verbalization of changes       Quality of Life Scores:  Quality of Life - 08/24/19 1058      Quality of Life   Select  Quality of Life      Quality of Life Scores   Health/Function Pre  22.96 %    Socioeconomic Pre  27.14 %    Psych/Spiritual Pre  27.86 %    Family Pre  26.4 %    GLOBAL Pre  25.41 %      Scores of 19 and below usually indicate a poorer quality of life in these areas.  A difference of  2-3 points is a clinically meaningful difference.  A difference of 2-3 points in the total score of the Quality of Life Index has been associated with significant improvement in overall quality of life, self-image, physical symptoms, and general health in studies assessing change in quality of life.  PHQ-9: Recent Review Flowsheet Data    Depression screen [facility] 2/9  08/24/2019 09/21/2018 03/31/2018 03/03/2018 03/02/2018   Decreased Interest 0 0 0 0 0   Down, Depressed, Hopeless 0 0 0 0 0   PHQ - 2 Score 0 0 0 0 0   Altered sleeping 0 0 0 - -   Tired, decreased energy 1 0 0 - -   Change in appetite 0 0 0 - -   Feeling bad or failure about yourself  0 0 0 - -   Trouble concentrating 0 0 0 - -   Moving slowly or fidgety/restless 0 0 0 - -   Suicidal thoughts 0 0 0 - -   PHQ-9 Score 1 0 0 - -   Difficult doing work/chores Not difficult at all -  Not difficult at all - -     Interpretation of Total Score  Total Score Depression Severity:  1-4 = Minimal depression, 5-9 = Mild depression, 10-14 = Moderate depression, 15-19 = Moderately severe depression, 20-27 = Severe depression   Psychosocial Evaluation and Intervention:   Psychosocial Re-Evaluation:   Psychosocial Discharge (Final Psychosocial Re-Evaluation):   Vocational Rehabilitation: Provide vocational rehab assistance to qualifying candidates.   Vocational Rehab Evaluation & Intervention: Vocational Rehab - 08/23/19 1136      Initial Vocational Rehab Evaluation & Intervention   Assessment shows need for Vocational Rehabilitation  No       Education: Education Goals: Education classes will be provided on a variety of topics geared toward better understanding of heart health and risk factor modification. Participant will state understanding/return demonstration of topics presented as noted by education test scores.  Learning Barriers/Preferences: Learning Barriers/Preferences - 08/23/19 1136      Learning Barriers/Preferences   Learning Barriers  None    Learning Preferences  None       Education Topics:  AED/CPR: - Group verbal and written instruction with the use of models to demonstrate the basic use of the AED with the basic ABC's of resuscitation.   General Nutrition Guidelines/Fats and Fiber: -Group instruction provided by verbal, written material, models and posters to present the general guidelines for heart healthy nutrition. Gives an explanation and review of dietary fats and fiber.   Controlling Sodium/Reading Food Labels: -Group verbal and written material supporting the discussion of sodium use in heart healthy nutrition. Review and explanation with models, verbal and written materials for utilization of the food label.   Exercise Physiology & General Exercise Guidelines: - Group verbal and written instruction with models to review the  exercise physiology of the cardiovascular system and associated critical values. Provides general exercise guidelines with specific guidelines to those with heart or lung disease.    Aerobic Exercise & Resistance Training: - Gives group verbal and written instruction on the various components of exercise. Focuses on aerobic and resistive training programs and the benefits of this training and how to safely progress through these programs..   Flexibility, Balance, Mind/Body Relaxation: Provides group verbal/written instruction on the benefits of flexibility and balance training, including mind/body exercise modes such as yoga, pilates and tai chi.  Demonstration and skill practice provided.   Stress and Anxiety: - Provides group verbal and written instruction about the health risks of elevated stress and causes of high stress.  Discuss the correlation between heart/lung disease and anxiety and treatment options. Review healthy ways to manage with stress and anxiety.   Depression: - Provides group verbal and written instruction on the correlation between heart/lung disease and depressed mood, treatment options, and the stigmas associated with  seeking treatment.   Anatomy & Physiology of the Heart: - Group verbal and written instruction and models provide basic cardiac anatomy and physiology, with the coronary electrical and arterial systems. Review of Valvular disease and Heart Failure   Cardiac Procedures: - Group verbal and written instruction to review commonly prescribed medications for heart disease. Reviews the medication, class of the drug, and side effects. Includes the steps to properly store meds and maintain the prescription regimen. (beta blockers and nitrates)   Cardiac Medications I: - Group verbal and written instruction to review commonly prescribed medications for heart disease. Reviews the medication, class of the drug, and side effects. Includes the steps to properly  store meds and maintain the prescription regimen.   Cardiac Medications II: -Group verbal and written instruction to review commonly prescribed medications for heart disease. Reviews the medication, class of the drug, and side effects. (all other drug classes)    Go Sex-Intimacy & Heart Disease, Get SMART - Goal Setting: - Group verbal and written instruction through game format to discuss heart disease and the return to sexual intimacy. Provides group verbal and written material to discuss and apply goal setting through the application of the S.M.A.R.T. Method.   Other Matters of the Heart: - Provides group verbal, written materials and models to describe Stable Angina and Peripheral Artery. Includes description of the disease process and treatment options available to the cardiac patient.   Exercise & Equipment Safety: - Individual verbal instruction and demonstration of equipment use and safety with use of the equipment.   Cardiac Rehab from 08/24/2019 in [facility] Rehab  Date  08/24/19  Educator  [facility]  Instruction Review Code  1- Verbalizes Understanding      Infection Prevention: - Provides verbal and written material to individual with discussion of infection control including proper hand washing and proper equipment cleaning during exercise session.   Cardiac Rehab from 08/24/2019 in [facility] Card[facility] Pulmonary Rehab  Date  08/24/19  Educator  [facility]  Instruction Review Code  1- Verbalizes Understanding      Falls Prevention: - Provides verbal and written material to individual with discussion of falls prevention and safety.   Cardiac Rehab from 08/24/2019 in [facility] Cardiac and Pulmonary Rehab  Date  08/24/19  Educator  [facility]  Instruction Review Code  1- Verbalizes Understanding      Diabetes: - Individual verbal and written instruction to review signs/symptoms of diabetes, desired ranges of glucose level fasting, after meals and with exercise. Acknowledge that pre  and post exercise glucose checks will be done for 3 sessions at entry of program.   Know Your Numbers and Risk Factors: -Group verbal and written instruction about important numbers in your health.  Discussion of what are risk factors and how they play a role in the disease process.  Review of Cholesterol, Blood Pressure, Diabetes, and BMI and the role they play in your overall health.   Sleep Hygiene: -Provides group verbal and written instruction about how sleep can affect your health.  Define sleep hygiene, discuss sleep cycles and impact of sleep habits. Review good sleep hygiene tips.    Other: -Provides group and verbal instruction on various topics (see comments)   Knowledge Questionnaire Score: Knowledge Questionnaire Score - 08/24/19 1057      Knowledge Questionnaire Score   Pre Score  19/26 Education Focus: Angina, Sex, HF, Nutrition, Exercise       Core Components/Risk Factors/Patient Goals at Admission: Personal Goals and Risk Factors  at Admission - 08/24/19 1041      Core Components/Risk Factors/Patient Goals on Admission    Weight Management  Yes;Weight Loss    Intervention  Weight Management: Develop a combined nutrition and exercise program designed to reach desired caloric intake, while maintaining appropriate intake of nutrient and fiber, sodium and fats, and appropriate energy expenditure required for the weight goal.;Weight Management: Provide education and appropriate resources to help participant work on and attain dietary goals.;Weight Management/Obesity: Establish reasonable short term and long term weight goals.    Admit Weight  170 lb (77.1 kg)    Goal Weight: Short Term  165 lb (74.8 kg)    Goal Weight: Long Term  160 lb (72.6 kg)    Expected Outcomes  Short Term: Continue to assess and modify interventions until short term weight is achieved;Long Term: Adherence to nutrition and physical activity/exercise program aimed toward attainment of established  weight goal;Weight Loss: Understanding of general recommendations for a balanced deficit meal plan, which promotes 1-2 lb weight loss per week and includes a negative energy balance of [phone] kcal/d;Understanding recommendations for meals to include 15-35% energy as protein, 25-35% energy from fat, 35-60% energy from carbohydrates, less than '200mg'$  of dietary cholesterol, 20-35 gm of total fiber daily;Understanding of distribution of calorie intake throughout the day with the consumption of 4-5 meals/snacks    Improve shortness of breath with ADL's  Yes    Intervention  Provide education, individualized exercise plan and daily activity instruction to help decrease symptoms of SOB with activities of daily living.    Expected Outcomes  Short Term: Improve cardiorespiratory fitness to achieve a reduction of symptoms when performing ADLs;Long Term: Be able to perform more ADLs without symptoms or delay the onset of symptoms    Hypertension  Yes    Intervention  Provide education on lifestyle modifcations including regular physical activity/exercise, weight management, moderate sodium restriction and increased consumption of fresh fruit, vegetables, and low fat dairy, alcohol moderation, and smoking cessation.;Monitor prescription use compliance.    Expected Outcomes  Short Term: Continued assessment and intervention until BP is < 140/88m HG in hypertensive participants. < 130/884mHG in hypertensive participants with diabetes, heart failure or chronic kidney disease.;Long Term: Maintenance of blood pressure at goal levels.    Lipids  Yes    Intervention  Provide education and support for participant on nutrition & aerobic/resistive exercise along with prescribed medications to achieve LDL '70mg'$ , HDL >'40mg'$ .    Expected Outcomes  Short Term: Participant states understanding of desired cholesterol values and is compliant with medications prescribed. Participant is following exercise prescription and nutrition  guidelines.;Long Term: Cholesterol controlled with medications as prescribed, with individualized exercise RX and with personalized nutrition plan. Value goals: LDL < '70mg'$ , HDL > 40 mg.       Core Components/Risk Factors/Patient Goals Review:    Core Components/Risk Factors/Patient Goals at Discharge (Final Review):    ITP Comments: ITP Comments    Row Name 08/23/19 1138 08/24/19 1032 08/30/19 0546       ITP Comments  Virtual initial orientation completed. [facility] EP/RD Orientation scheduled 9/3 at 9:30  Completed 6MWT, gym orientation, and RD evaluation. Initial ITP created and sent for review to Dr. MaEmily FilbertMedical Director.  30 Day review. Continue with ITP unless directed changes per Medical Director review.  New to program attended orientation        Comments:

## 2019-09-04 ENCOUNTER — Encounter: Payer: PPO | Admitting: *Deleted

## 2019-09-04 ENCOUNTER — Other Ambulatory Visit: Payer: Self-pay

## 2019-09-04 DIAGNOSIS — E782 Mixed hyperlipidemia: Secondary | ICD-10-CM | POA: Diagnosis not present

## 2019-09-04 DIAGNOSIS — Z955 Presence of coronary angioplasty implant and graft: Secondary | ICD-10-CM

## 2019-09-04 DIAGNOSIS — I10 Essential (primary) hypertension: Secondary | ICD-10-CM | POA: Diagnosis not present

## 2019-09-04 DIAGNOSIS — I480 Paroxysmal atrial fibrillation: Secondary | ICD-10-CM | POA: Diagnosis present

## 2019-09-04 DIAGNOSIS — I2510 Atherosclerotic heart disease of native coronary artery without angina pectoris: Secondary | ICD-10-CM | POA: Diagnosis not present

## 2019-09-04 NOTE — Progress Notes (Signed)
Incomplete Session Note  Patient Details  Name: Kelsey Carter MRN: [id number] Date of Birth: February 26, 1946 Referring Provider:     Cardiac Rehab from 08/24/2019 in [facility]  Referring Provider  Josephina Gip MD      [name] did not complete her rehab session.   After placing Collie Siad on the monitor- noted fast heartrate and irregular rhythm.  Looking at previous strips she was in NSR.  Called MD office and she has appointment to see PA/NP at 230. No exercise, Sent home to go to MD appt today. Advised if she feels any symptoms SOB, Chest pain/tightness to call 911.

## 2019-09-04 NOTE — Progress Notes (Signed)
Daily Session Note  Patient Details  Name: Kelsey Carter MRN: [id number] Date of Birth: [birth year] Referring Provider:     Cardiac Rehab from 08/24/2019 in [facility] Cardiac and Pulmonary Rehab  Referring Provider  Josephina Gip MD      Encounter Date: 09/04/2019  Check In: Session Check In - 09/04/19 1024      Check-In   Supervising physician immediately available to respond to emergencies  See telemetry face sheet for immediately available ER MD    Location  [facility]-Cardiac & Pulmonary Rehab    Staff Present  Heath Lark, RN, BSN, Laveda Norman, BS, ACSM CEP, Exercise Physiologist;Joseph Tessie Fass RCP,RRT,BSRT    Virtual Visit  No    Medication changes reported      No    Fall or balance concerns reported     No    Warm-up and Cool-down  Performed on first and last piece of equipment    Resistance Training Performed  Yes    VAD Patient?  No    PAD/SET Patient?  No      Pain Assessment   Currently in Pain?  No/denies          Social History   Tobacco Use  Smoking Status Never Smoker  Smokeless Tobacco Never Used    Goals Met:    Goals Unmet:  Not Applicable  Comments: Incomplete session  See note   Dr. Emily Filbert is Medical Director for [place] and [facility].

## 2019-09-12 ENCOUNTER — Encounter: Payer: Self-pay | Admitting: *Deleted

## 2019-09-12 ENCOUNTER — Telehealth: Payer: Self-pay | Admitting: *Deleted

## 2019-09-12 DIAGNOSIS — Z955 Presence of coronary angioplasty implant and graft: Secondary | ICD-10-CM

## 2019-09-12 NOTE — Telephone Encounter (Signed)
Called check up on pt since we sent her to doctor for afib.  She has started some new medications.  She has a follow up appt on Friday and will ask about returning to rehab then.

## 2019-09-15 DIAGNOSIS — I2510 Atherosclerotic heart disease of native coronary artery without angina pectoris: Secondary | ICD-10-CM | POA: Diagnosis not present

## 2019-09-15 DIAGNOSIS — E782 Mixed hyperlipidemia: Secondary | ICD-10-CM | POA: Diagnosis not present

## 2019-09-15 DIAGNOSIS — I10 Essential (primary) hypertension: Secondary | ICD-10-CM | POA: Diagnosis not present

## 2019-09-15 DIAGNOSIS — I480 Paroxysmal atrial fibrillation: Secondary | ICD-10-CM | POA: Diagnosis not present

## 2019-09-25 DIAGNOSIS — E782 Mixed hyperlipidemia: Secondary | ICD-10-CM | POA: Diagnosis not present

## 2019-09-25 DIAGNOSIS — I480 Paroxysmal atrial fibrillation: Secondary | ICD-10-CM | POA: Diagnosis not present

## 2019-09-25 DIAGNOSIS — I10 Essential (primary) hypertension: Secondary | ICD-10-CM | POA: Diagnosis not present

## 2019-09-25 DIAGNOSIS — I2510 Atherosclerotic heart disease of native coronary artery without angina pectoris: Secondary | ICD-10-CM | POA: Diagnosis not present

## 2019-09-27 ENCOUNTER — Encounter: Payer: PPO | Attending: Internal Medicine

## 2019-09-27 ENCOUNTER — Encounter: Payer: Self-pay | Admitting: *Deleted

## 2019-09-27 DIAGNOSIS — R0602 Shortness of breath: Secondary | ICD-10-CM | POA: Diagnosis not present

## 2019-09-27 DIAGNOSIS — J40 Bronchitis, not specified as acute or chronic: Secondary | ICD-10-CM | POA: Diagnosis not present

## 2019-09-27 DIAGNOSIS — R197 Diarrhea, unspecified: Secondary | ICD-10-CM | POA: Diagnosis not present

## 2019-09-27 DIAGNOSIS — Z955 Presence of coronary angioplasty implant and graft: Secondary | ICD-10-CM | POA: Insufficient documentation

## 2019-09-27 NOTE — Progress Notes (Signed)
Cardiac Individual Treatment Plan  Patient Details  Name: Kelsey Carter MRN: [id number] Date of Birth: 04/28/1946 Referring Provider:     Cardiac Rehab from 08/24/2019 in [facility]  Referring Provider  Kowalski, Burce MD      Initial Encounter Date:    Cardiac Rehab from 08/24/2019 in [facility]  Date  08/24/19      Visit Diagnosis: Status post coronary artery stent placement  Patient's Home Medications on Admission:  Current Outpatient Medications:  .  alendronate (FOSAMAX) 70 MG tablet, Take 1 tablet (70 mg total) by mouth once a week. (Patient taking differently: Take 70 mg by mouth every Wednesday. ), Disp: 4 tablet, Rfl: 6 .  ALPRAZolam (XANAX) 1 MG tablet, TAKE 1 TABLET(1 MG) BY MOUTH AT BEDTIME AS NEEDED FOR ANXIETY (Patient taking differently: Take 1 mg by mouth at bedtime. ), Disp: 30 tablet, Rfl: 0 .  aspirin EC 81 MG tablet, Take 81 mg by mouth daily., Disp: , Rfl:  .  atorvastatin (LIPITOR) 40 MG tablet, Take 40 mg by mouth at bedtime., Disp: , Rfl:  .  Cholecalciferol (VITAMIN D) 2000 units CAPS, Take 1 capsule (2,000 Units total) by mouth daily. (Patient taking differently: Take 4,000 capsules by mouth daily. ), Disp: 30 capsule, Rfl:  .  clopidogrel (PLAVIX) 75 MG tablet, Take 1 tablet (75 mg total) by mouth daily., Disp: 90 tablet, Rfl: 2 .  MAGNESIUM PO, Take 1 tablet by mouth daily., Disp: , Rfl:  .  metoprolol succinate (TOPROL-XL) 50 MG 24 hr tablet, Take 50 mg by mouth daily. , Disp: , Rfl:  .  Omega-3 Fatty Acids (FISH OIL) 1000 MG CAPS, Take 1,000 mg by mouth daily., Disp: , Rfl:  .  telmisartan (MICARDIS) 80 MG tablet, Take 1 tablet (80 mg total) by mouth daily., Disp: 90 tablet, Rfl: 3 .  vitamin C (ASCORBIC ACID) 250 MG tablet, Take 500 mg by mouth daily., Disp: , Rfl:   Past Medical History: Past Medical History:  Diagnosis Date  . Anemia    vitamin d deficiency  . Anxiety   . Arthritis    back, DDD  .  Breast cancer (HCC)    bilateral mastectomies.  different types of cancer in each breast  . Cardiomyopathy due to chemotherapy (HCC) 2011  . Chronic pain 2019  . Complication of anesthesia   . Coronary artery disease   . GERD (gastroesophageal reflux disease)   . Hyperlipidemia   . Hypertension   . Lumbar herniated disc   . Peripheral vascular disease (HCC)    neuropathies in extremities due to chemotherapy  . Personal history of chemotherapy   . Personal history of radiation therapy   . PONV (postoperative nausea and vomiting)     Tobacco Use: Social History   Tobacco Use  Smoking Status Never Smoker  Smokeless Tobacco Never Used    Labs: Recent Review Flowsheet Data    Labs for ITP Cardiac and Pulmonary Rehab Latest Ref Rng & Units 04/24/2013 08/24/2017 03/22/2018 09/21/2018   Cholestrol 0 - 200 mg/dL 160 117 - 130   LDLCALC 0 - 99 mg/dL 87 48 - 59   HDL >40 mg/dL 54 53 - 49   Trlycerides <150 mg/dL 97 82 - 108   Hemoglobin A1c 4.8 - 5.6 % - - 5.9(H) 5.6       Exercise Target Goals: Exercise Program Goal: Individual exercise prescription set using results from initial 6 min walk test   and THRR while considering  patient's activity barriers and safety.   Exercise Prescription Goal: Initial exercise prescription builds to 30-45 minutes a day of aerobic activity, 2-3 days per week.  Home exercise guidelines will be given to patient during program as part of exercise prescription that the participant will acknowledge.  Activity Barriers & Risk Stratification: Activity Barriers & Cardiac Risk Stratification - 08/24/19 1034      Activity Barriers & Cardiac Risk Stratification   Activity Barriers  Other (comment);Balance Concerns;Back Problems;Muscular Weakness;Deconditioning;Shortness of Breath    Comments  neuropathy in hands and feet (post chemo), spinal stenosis    Cardiac Risk Stratification  Moderate       6 Minute Walk: 6 Minute Walk    Row Name 08/24/19 1033          6 Minute Walk   Phase  Initial     Distance  1170 feet     Walk Time  6 minutes     # of Rest Breaks  0     MPH  2.22     METS  2.43     RPE  1     Perceived Dyspnea   1     VO2 Peak  8.54     Symptoms  Yes (comment)     Comments  SOB     Resting HR  70 bpm     Resting BP  112/56     Resting Oxygen Saturation   97 %     Exercise Oxygen Saturation  during 6 min walk  95 %     Max Ex. HR  105 bpm     Max Ex. BP  134/70     2 Minute Post BP  130/64        Oxygen Initial Assessment:   Oxygen Re-Evaluation:   Oxygen Discharge (Final Oxygen Re-Evaluation):   Initial Exercise Prescription: Initial Exercise Prescription - 08/24/19 1000      Date of Initial Exercise RX and Referring Provider   Date  08/24/19    Referring Provider  Josephina Gip MD      Treadmill   MPH  1.9    Grade  0    Minutes  15    METs  2.45      Elliptical   Level  2    Speed  1    Minutes  15      REL-XR   Level  1    Speed  50    Minutes  15    METs  2      T5 Nustep   Level  1    SPM  80    Minutes  15    METs  2      Prescription Details   Frequency (times per week)  2    Duration  Progress to 30 minutes of continuous aerobic without signs/symptoms of physical distress      Intensity   THRR 40-80% of Max Heartrate  101-132    Ratings of Perceived Exertion  11-13    Perceived Dyspnea  0-4      Progression   Progression  Continue to progress workloads to maintain intensity without signs/symptoms of physical distress.      Resistance Training   Training Prescription  Yes    Weight  3lbs    Reps  10-15       Perform Capillary Blood Glucose checks as needed.  Exercise Prescription Changes: Exercise Prescription Changes  [place] Name 08/24/19 1000 08/30/19 1100           Response to Exercise   Blood Pressure (Admit)  112/56  124/84      Blood Pressure (Exercise)  134/70  146/72      Blood Pressure (Exit)  130/64  122/64      Heart Rate (Admit)  70 bpm  63  bpm      Heart Rate (Exercise)  105 bpm  131 bpm      Heart Rate (Exit)  74 bpm  85 bpm      Oxygen Saturation (Admit)  97 %  -      Oxygen Saturation (Exercise)  95 %  -      Rating of Perceived Exertion (Exercise)  11  14      Perceived Dyspnea (Exercise)  1  -      Symptoms  SOB  -      Comments  walk test results  first day         Exercise Comments: Exercise Comments    Row Name 08/30/19 1049 09/04/19 1056         Exercise Comments  First full day of exercise!  Patient was oriented to gym and equipment including functions, settings, policies, and procedures.  Patient's individual exercise prescription and treatment plan were reviewed.  All starting workloads were established based on the results of the 6 minute walk test done at initial orientation visit.  The plan for exercise progression was also introduced and progression will be customized based on patient's performance and goals.  [name] did not complete her rehab session.   After placing Collie Siad on the monitor- noted fast heartrate and irregular rhythm.  Looking at previous strips she was in NSR.  Called MD office and she has appointment to see PA/NP at 230. No exercise, Sent home to go to MD appt today. Advised if she feels any symptoms SOB, Chest pain/tightness to call 911.         Exercise Goals and Review: Exercise Goals    Row Name 08/24/19 1039             Exercise Goals   Increase Physical Activity  Yes       Intervention  Provide advice, education, support and counseling about physical activity/exercise needs.;Develop an individualized exercise prescription for aerobic and resistive training based on initial evaluation findings, risk stratification, comorbidities and participant's personal goals.       Expected Outcomes  Short Term: Attend rehab on a regular basis to increase amount of physical activity.;Long Term: Add in home exercise to make exercise part of routine and to increase amount of physical  activity.;Long Term: Exercising regularly at least 3-5 days a week.       Increase Strength and Stamina  Yes       Intervention  Provide advice, education, support and counseling about physical activity/exercise needs.;Develop an individualized exercise prescription for aerobic and resistive training based on initial evaluation findings, risk stratification, comorbidities and participant's personal goals.       Expected Outcomes  Short Term: Increase workloads from initial exercise prescription for resistance, speed, and METs.;Short Term: Perform resistance training exercises routinely during rehab and add in resistance training at home;Long Term: Improve cardiorespiratory fitness, muscular endurance and strength as measured by increased METs and functional capacity (6MWT)       Able to understand and use rate of perceived exertion (RPE) scale  Yes  Intervention  Provide education and explanation on how to use RPE scale       Expected Outcomes  Short Term: Able to use RPE daily in rehab to express subjective intensity level;Long Term:  Able to use RPE to guide intensity level when exercising independently       Able to understand and use Dyspnea scale  Yes       Intervention  Provide education and explanation on how to use Dyspnea scale       Expected Outcomes  Short Term: Able to use Dyspnea scale daily in rehab to express subjective sense of shortness of breath during exertion;Long Term: Able to use Dyspnea scale to guide intensity level when exercising independently       Knowledge and understanding of Target Heart Rate Range (THRR)  Yes       Intervention  Provide education and explanation of THRR including how the numbers were predicted and where they are located for reference       Expected Outcomes  Short Term: Able to state/look up THRR;Short Term: Able to use daily as guideline for intensity in rehab;Long Term: Able to use THRR to govern intensity when exercising independently       Able  to check pulse independently  Yes       Intervention  Provide education and demonstration on how to check pulse in carotid and radial arteries.;Review the importance of being able to check your own pulse for safety during independent exercise       Expected Outcomes  Short Term: Able to explain why pulse checking is important during independent exercise;Long Term: Able to check pulse independently and accurately       Understanding of Exercise Prescription  Yes       Intervention  Provide education, explanation, and written materials on patient's individual exercise prescription       Expected Outcomes  Short Term: Able to explain program exercise prescription;Long Term: Able to explain home exercise prescription to exercise independently          Exercise Goals Re-Evaluation : Exercise Goals Re-Evaluation    [place] Name 08/30/19 1049             Exercise Goal Re-Evaluation   Exercise Goals Review  Increase Strength and Stamina;Increase Physical Activity;Able to understand and use rate of perceived exertion (RPE) scale;Knowledge and understanding of Target Heart Rate Range (THRR);Understanding of Exercise Prescription       Comments  Reviewed RPE scale, THR and program prescription with pt today.  Pt voiced understanding and was given a copy of goals to take home.       Expected Outcomes  Short: Use RPE daily to regulate intensity. Long: Follow program prescription in THR.          Discharge Exercise Prescription (Final Exercise Prescription Changes): Exercise Prescription Changes - 08/30/19 1100      Response to Exercise   Blood Pressure (Admit)  124/84    Blood Pressure (Exercise)  146/72    Blood Pressure (Exit)  122/64    Heart Rate (Admit)  63 bpm    Heart Rate (Exercise)  131 bpm    Heart Rate (Exit)  85 bpm    Rating of Perceived Exertion (Exercise)  14    Comments  first day       Nutrition:  Target Goals: Understanding of nutrition guidelines, daily intake of sodium  '1500mg'$ , cholesterol '200mg'$ , calories 30% from fat and 7% or less from saturated fats, daily  to have 5 or more servings of fruits and vegetables.  Biometrics: Pre Biometrics - 08/24/19 1040      Pre Biometrics   Height  5' 4.1" (1.628 m)    Weight  170 lb (77.1 kg)    BMI (Calculated)  29.09    Single Leg Stand  1.56 seconds        Nutrition Therapy Plan and Nutrition Goals: Nutrition Therapy & Goals - 08/24/19 1034      Nutrition Therapy   Diet  Low Na, HH eating    Protein (specify units)  65g    Fiber  25 grams    Whole Grain Foods  3 servings    Saturated Fats  12 max. grams    Fruits and Vegetables  5 servings/day    Sodium  1.5 grams      Personal Nutrition Goals   Nutrition Goal  ST: add more protein, make sure eating enough kcal LT: get more energy and strength    Comments  Pt reports eating raisin bread toast with some butter (WG) or instant oatmeal (pt reports the lower sugar one) or yogurt (pt reports 60kcal fruit one). Dinner is usually chicken (breast or thigh and skin) with vegetables (occasionally regular pasta , but small amount). Discussed protein and kcal needs and that she doesn't seem to be getting enough especially not enough to support exercise and muscle building. Talked about maybe a snack before and after exercise (pt wants to do a kind bar before and greek yogurt after). Talked about healthy snacking and how to balance snacks (fat and prtoein especially). Pt reports will sometimes have a yogurt in the middle of the day and will add jello or pudding cup if she feels like she isn't getting enough. Discussed HH eating and weight loss vs healthy outcomes. Discussed that we will review energy and strength levels during program to ensure she is getting enough.      Intervention Plan   Intervention  Prescribe, educate and counsel regarding individualized specific dietary modifications aiming towards targeted core components such as weight, hypertension, lipid  management, diabetes, heart failure and other comorbidities.;Nutrition handout(s) given to patient.    Expected Outcomes  Short Term Goal: Understand basic principles of dietary content, such as calories, fat, sodium, cholesterol and nutrients.;Short Term Goal: A plan has been developed with personal nutrition goals set during dietitian appointment.;Long Term Goal: Adherence to prescribed nutrition plan.       Nutrition Assessments: Nutrition Assessments - 08/24/19 1314      MEDFICTS Scores   Pre Score  15       Nutrition Goals Re-Evaluation:   Nutrition Goals Discharge (Final Nutrition Goals Re-Evaluation):   Psychosocial: Target Goals: Acknowledge presence or absence of significant depression and/or stress, maximize coping skills, provide positive support system. Participant is able to verbalize types and ability to use techniques and skills needed for reducing stress and depression.   Initial Review & Psychosocial Screening: Initial Psych Review & Screening - 08/23/19 1134      Initial Review   Current issues with  Current Sleep Concerns   sleepy throughout the day     Family Dynamics   Good Support System?  Yes      Barriers   Psychosocial barriers to participate in program  There are no identifiable barriers or psychosocial needs.;The patient should benefit from training in stress management and relaxation.      Screening Interventions   Interventions  Encouraged to exercise;To provide support and  resources with identified psychosocial needs;Provide feedback about the scores to participant    Expected Outcomes  Short Term goal: Utilizing psychosocial counselor, staff and physician to assist with identification of specific Stressors or current issues interfering with healing process. Setting desired goal for each stressor or current issue identified.;Long Term Goal: Stressors or current issues are controlled or eliminated.;Short Term goal: Identification and review with  participant of any Quality of Life or Depression concerns found by scoring the questionnaire.;Long Term goal: The participant improves quality of Life and PHQ9 Scores as seen by post scores and/or verbalization of changes       Quality of Life Scores:  Quality of Life - 08/24/19 1058      Quality of Life   Select  Quality of Life      Quality of Life Scores   Health/Function Pre  22.96 %    Socioeconomic Pre  27.14 %    Psych/Spiritual Pre  27.86 %    Family Pre  26.4 %    GLOBAL Pre  25.41 %      Scores of 19 and below usually indicate a poorer quality of life in these areas.  A difference of  2-3 points is a clinically meaningful difference.  A difference of 2-3 points in the total score of the Quality of Life Index has been associated with significant improvement in overall quality of life, self-image, physical symptoms, and general health in studies assessing change in quality of life.  PHQ-9: Recent Review Flowsheet Data    Depression screen [facility] 2/9 08/24/2019 09/21/2018 03/31/2018 03/03/2018 03/02/2018   Decreased Interest 0 0 0 0 0   Down, Depressed, Hopeless 0 0 0 0 0   PHQ - 2 Score 0 0 0 0 0   Altered sleeping 0 0 0 - -   Tired, decreased energy 1 0 0 - -   Change in appetite 0 0 0 - -   Feeling bad or failure about yourself  0 0 0 - -   Trouble concentrating 0 0 0 - -   Moving slowly or fidgety/restless 0 0 0 - -   Suicidal thoughts 0 0 0 - -   PHQ-9 Score 1 0 0 - -   Difficult doing work/chores Not difficult at all - Not difficult at all - -     Interpretation of Total Score  Total Score Depression Severity:  1-4 = Minimal depression, 5-9 = Mild depression, 10-14 = Moderate depression, 15-19 = Moderately severe depression, 20-27 = Severe depression   Psychosocial Evaluation and Intervention:   Psychosocial Re-Evaluation:   Psychosocial Discharge (Final Psychosocial Re-Evaluation):   Vocational Rehabilitation: Provide vocational rehab assistance to qualifying  candidates.   Vocational Rehab Evaluation & Intervention: Vocational Rehab - 08/23/19 1136      Initial Vocational Rehab Evaluation & Intervention   Assessment shows need for Vocational Rehabilitation  No       Education: Education Goals: Education classes will be provided on a variety of topics geared toward better understanding of heart health and risk factor modification. Participant will state understanding/return demonstration of topics presented as noted by education test scores.  Learning Barriers/Preferences: Learning Barriers/Preferences - 08/23/19 1136      Learning Barriers/Preferences   Learning Barriers  None    Learning Preferences  None       Education Topics:  AED/CPR: - Group verbal and written instruction with the use of models to demonstrate the basic use of the AED with the basic  ABC's of resuscitation.   General Nutrition Guidelines/Fats and Fiber: -Group instruction provided by verbal, written material, models and posters to present the general guidelines for heart healthy nutrition. Gives an explanation and review of dietary fats and fiber.   Controlling Sodium/Reading Food Labels: -Group verbal and written material supporting the discussion of sodium use in heart healthy nutrition. Review and explanation with models, verbal and written materials for utilization of the food label.   Exercise Physiology & General Exercise Guidelines: - Group verbal and written instruction with models to review the exercise physiology of the cardiovascular system and associated critical values. Provides general exercise guidelines with specific guidelines to those with heart or lung disease.    Aerobic Exercise & Resistance Training: - Gives group verbal and written instruction on the various components of exercise. Focuses on aerobic and resistive training programs and the benefits of this training and how to safely progress through these programs..   Flexibility,  Balance, Mind/Body Relaxation: Provides group verbal/written instruction on the benefits of flexibility and balance training, including mind/body exercise modes such as yoga, pilates and tai chi.  Demonstration and skill practice provided.   Stress and Anxiety: - Provides group verbal and written instruction about the health risks of elevated stress and causes of high stress.  Discuss the correlation between heart/lung disease and anxiety and treatment options. Review healthy ways to manage with stress and anxiety.   Depression: - Provides group verbal and written instruction on the correlation between heart/lung disease and depressed mood, treatment options, and the stigmas associated with seeking treatment.   Anatomy & Physiology of the Heart: - Group verbal and written instruction and models provide basic cardiac anatomy and physiology, with the coronary electrical and arterial systems. Review of Valvular disease and Heart Failure   Cardiac Procedures: - Group verbal and written instruction to review commonly prescribed medications for heart disease. Reviews the medication, class of the drug, and side effects. Includes the steps to properly store meds and maintain the prescription regimen. (beta blockers and nitrates)   Cardiac Medications I: - Group verbal and written instruction to review commonly prescribed medications for heart disease. Reviews the medication, class of the drug, and side effects. Includes the steps to properly store meds and maintain the prescription regimen.   Cardiac Medications II: -Group verbal and written instruction to review commonly prescribed medications for heart disease. Reviews the medication, class of the drug, and side effects. (all other drug classes)    Go Sex-Intimacy & Heart Disease, Get SMART - Goal Setting: - Group verbal and written instruction through game format to discuss heart disease and the return to sexual intimacy. Provides group  verbal and written material to discuss and apply goal setting through the application of the S.M.A.R.T. Method.   Other Matters of the Heart: - Provides group verbal, written materials and models to describe Stable Angina and Peripheral Artery. Includes description of the disease process and treatment options available to the cardiac patient.   Exercise & Equipment Safety: - Individual verbal instruction and demonstration of equipment use and safety with use of the equipment.   Cardiac Rehab from 08/24/2019 in [facility]iac [facility] Pul[facility] Re[facility]  Date  08/24/19  Educator  [facility]  Instruction Review Code  1- Verbalizes Understanding      Infection Prevention: - Provides verbal and written material to individual with discussion of infection control including proper hand washing and proper equipment cleaning during exercise session.   Cardiac Rehab from 08/24/2019 in [facility] Cardiac  and Pulmonary Rehab  Date  08/24/19  Educator  [facility]  Instruction Review Code  1- Verbalizes Understanding      Falls Prevention: - Provides verbal and written material to individual with discussion of falls prevention and safety.   Cardiac Rehab from 08/24/2019 in [facility] Cardiac and Pulmonary Rehab  Date  08/24/19  Educator  [facility]  Instruction Review Code  1- Verbalizes Understanding      Diabetes: - Individual verbal and written instruction to review signs/symptoms of diabetes, desired ranges of glucose level fasting, after meals and with exercise. Acknowledge that pre and post exercise glucose checks will be done for 3 sessions at entry of program.   Know Your Numbers and Risk Factors: -Group verbal and written instruction about important numbers in your health.  Discussion of what are risk factors and how they play a role in the disease process.  Review of Cholesterol, Blood Pressure, Diabetes, and BMI and the role they play in your overall health.   Sleep Hygiene: -Provides group verbal and written instruction about  how sleep can affect your health.  Define sleep hygiene, discuss sleep cycles and impact of sleep habits. Review good sleep hygiene tips.    Other: -Provides group and verbal instruction on various topics (see comments)   Knowledge Questionnaire Score: Knowledge Questionnaire Score - 08/24/19 1057      Knowledge Questionnaire Score   Pre Score  19/26 Education Focus: Angina, Sex, HF, Nutrition, Exercise       Core Components/Risk Factors/Patient Goals at Admission: Personal Goals and Risk Factors at Admission - 08/24/19 1041      Core Components/Risk Factors/Patient Goals on Admission    Weight Management  Yes;Weight Loss    Intervention  Weight Management: Develop a combined nutrition and exercise program designed to reach desired caloric intake, while maintaining appropriate intake of nutrient and fiber, sodium and fats, and appropriate energy expenditure required for the weight goal.;Weight Management: Provide education and appropriate resources to help participant work on and attain dietary goals.;Weight Management/Obesity: Establish reasonable short term and long term weight goals.    Admit Weight  170 lb (77.1 kg)    Goal Weight: Short Term  165 lb (74.8 kg)    Goal Weight: Long Term  160 lb (72.6 kg)    Expected Outcomes  Short Term: Continue to assess and modify interventions until short term weight is achieved;Long Term: Adherence to nutrition and physical activity/exercise program aimed toward attainment of established weight goal;Weight Loss: Understanding of general recommendations for a balanced deficit meal plan, which promotes 1-2 lb weight loss per week and includes a negative energy balance of [phone] kcal/d;Understanding recommendations for meals to include 15-35% energy as protein, 25-35% energy from fat, 35-60% energy from carbohydrates, less than '200mg'$  of dietary cholesterol, 20-35 gm of total fiber daily;Understanding of distribution of calorie intake throughout the day  with the consumption of 4-5 meals/snacks    Improve shortness of breath with ADL's  Yes    Intervention  Provide education, individualized exercise plan and daily activity instruction to help decrease symptoms of SOB with activities of daily living.    Expected Outcomes  Short Term: Improve cardiorespiratory fitness to achieve a reduction of symptoms when performing ADLs;Long Term: Be able to perform more ADLs without symptoms or delay the onset of symptoms    Hypertension  Yes    Intervention  Provide education on lifestyle modifcations including regular physical activity/exercise, weight management, moderate sodium restriction and increased consumption of fresh fruit, vegetables,  and low fat dairy, alcohol moderation, and smoking cessation.;Monitor prescription use compliance.    Expected Outcomes  Short Term: Continued assessment and intervention until BP is < 140/58m HG in hypertensive participants. < 130/841mHG in hypertensive participants with diabetes, heart failure or chronic kidney disease.;Long Term: Maintenance of blood pressure at goal levels.    Lipids  Yes    Intervention  Provide education and support for participant on nutrition & aerobic/resistive exercise along with prescribed medications to achieve LDL '70mg'$ , HDL >'40mg'$ .    Expected Outcomes  Short Term: Participant states understanding of desired cholesterol values and is compliant with medications prescribed. Participant is following exercise prescription and nutrition guidelines.;Long Term: Cholesterol controlled with medications as prescribed, with individualized exercise RX and with personalized nutrition plan. Value goals: LDL < '70mg'$ , HDL > 40 mg.       Core Components/Risk Factors/Patient Goals Review:    Core Components/Risk Factors/Patient Goals at Discharge (Final Review):    ITP Comments: ITP Comments    Row Name 08/23/19 1138 08/24/19 1032 08/30/19 0546 09/04/19 1056 09/12/19 1109   ITP Comments  Virtual initial  orientation completed. [facility] EP/RD Orientation scheduled 9/3 at 9:30  Completed 6MWT, gym orientation, and RD evaluation. Initial ITP created and sent for review to Dr. MaEmily FilbertMedical Director.  30 Day review. Continue with ITP unless directed changes per Medical Director review.  New to program attended orientation  MaROSSI SILVESTROid not complete her rehab session.   After placing SuCollie Siadn the monitor- noted fast heartrate and irregular rhythm.  Looking at previous strips she was in NSR.  Called MD office and she has appointment to see PA/NP at 230. No exercise, Sent home to go to MD appt today. Advised if she feels any symptoms SOB, Chest pain/tightness to call 911.  Called check up on pt since we sent her to doctor for afib.  She has started some new medications.  She has a follow up appt on Friday and will ask about returning to rehab then.   [place] 09/27/19 1117           ITP Comments  30 day review completed. ITP sent to Dr. MaEmily FilbertMedical Director of Cardiac and Pulmonary Rehab. Continue with ITP unless changes are made by physician.  Department closed starting 10/2 until further notice by infection prevention and Health at Work teams for [place]         Comments: 30 day review

## 2019-10-03 ENCOUNTER — Encounter: Payer: Self-pay | Admitting: *Deleted

## 2019-10-03 ENCOUNTER — Telehealth: Payer: Self-pay | Admitting: *Deleted

## 2019-10-03 DIAGNOSIS — Z955 Presence of coronary angioplasty implant and graft: Secondary | ICD-10-CM

## 2019-10-03 NOTE — Telephone Encounter (Signed)
Called to check on pt.  She has been out since 9/9.  She was cleared to return to rehab and will be back next Monday.

## 2019-10-09 ENCOUNTER — Encounter: Payer: PPO | Admitting: *Deleted

## 2019-10-09 ENCOUNTER — Other Ambulatory Visit: Payer: Self-pay

## 2019-10-09 DIAGNOSIS — Z955 Presence of coronary angioplasty implant and graft: Secondary | ICD-10-CM | POA: Diagnosis not present

## 2019-10-09 NOTE — Progress Notes (Signed)
Daily Session Note  Patient Details  Name: Kelsey Carter MRN: [id number] Date of Birth: 10-05-1946 Referring Provider:     Cardiac Rehab from 08/24/2019 in [facility] Cardiac and Pulmonary Rehab  Referring Provider  Josephina Gip MD      Encounter Date: 10/09/2019  Check In: Session Check In - 10/09/19 1021      Check-In   Supervising physician immediately available to respond to emergencies  See telemetry face sheet for immediately available ER MD    Location  [facility]-Cardiac & Pulmonary Rehab    Staff Present  Heath Lark, RN, BSN, CCRP;Joseph Hood RCP,RRT,BSRT;Kelly [place], [place], ACSM CEP, Exercise Physiologist    Virtual Visit  No    Medication changes reported      No    Fall or balance concerns reported     No    Warm-up and Cool-down  Performed on first and last piece of equipment    Resistance Training Performed  Yes    VAD Patient?  No    PAD/SET Patient?  No      Pain Assessment   Currently in Pain?  No/denies          Social History   Tobacco Use  Smoking Status Never Smoker  Smokeless Tobacco Never Used    Goals Met:  Independence with exercise equipment Exercise tolerated well No report of cardiac concerns or symptoms  Goals Unmet:  Not Applicable  Comments: Pt able to follow exercise prescription today without complaint.  Will continue to monitor for progression.    Dr. Emily Filbert is Medical Director for [place] and [facility].

## 2019-10-11 ENCOUNTER — Encounter: Payer: PPO | Admitting: *Deleted

## 2019-10-11 ENCOUNTER — Other Ambulatory Visit: Payer: Self-pay

## 2019-10-11 DIAGNOSIS — Z955 Presence of coronary angioplasty implant and graft: Secondary | ICD-10-CM

## 2019-10-11 NOTE — Progress Notes (Signed)
Daily Session Note  Patient Details  Name: Kelsey Carter MRN: [id number] Date of Birth: 04/16/1946 Referring Provider:     Cardiac Rehab from 08/24/2019 in [facility] Cardiac and Pulmonary Rehab  Referring Provider  Kowalski, Burce MD      Encounter Date: 10/11/2019  Check In: Session Check In - 10/11/19 1029      Check-In   Supervising physician immediately available to respond to emergencies  See telemetry face sheet for immediately available ER MD    Location  [facility]-Cardiac & Pulmonary Rehab    Staff Present  Krista Spencer, RN BSN;Joseph Hood RCP,RRT,BSRT;Jessica Hawkins, MA, RCEP, CCRP, CCET    Virtual Visit  No    Medication changes reported      No    Fall or balance concerns reported     No    Warm-up and Cool-down  Performed on first and last piece of equipment    Resistance Training Performed  Yes    VAD Patient?  No    PAD/SET Patient?  No      Pain Assessment   Currently in Pain?  No/denies          Social History   Tobacco Use  Smoking Status Never Smoker  Smokeless Tobacco Never Used    Goals Met:  Independence with exercise equipment Exercise tolerated well No report of cardiac concerns or symptoms Strength training completed today  Goals Unmet:  Not Applicable  Comments: Pt able to follow exercise prescription today without complaint.  Will continue to monitor for progression.  Reviewed home exercise with pt today.  Pt plans to walking at home and staff videos for exercise.  Reviewed THR, pulse, RPE, sign and symptoms, and when to call 911 or MD.  Also discussed weather considerations and indoor options.  Pt voiced understanding.    Dr. Mark Miller is Medical Director for [facility] and [facility]. 

## 2019-10-16 DIAGNOSIS — I2510 Atherosclerotic heart disease of native coronary artery without angina pectoris: Secondary | ICD-10-CM | POA: Diagnosis not present

## 2019-10-16 DIAGNOSIS — E782 Mixed hyperlipidemia: Secondary | ICD-10-CM | POA: Diagnosis not present

## 2019-10-16 DIAGNOSIS — I480 Paroxysmal atrial fibrillation: Secondary | ICD-10-CM | POA: Diagnosis not present

## 2019-10-16 DIAGNOSIS — I10 Essential (primary) hypertension: Secondary | ICD-10-CM | POA: Diagnosis not present

## 2019-10-18 ENCOUNTER — Encounter: Payer: PPO | Admitting: *Deleted

## 2019-10-18 ENCOUNTER — Other Ambulatory Visit: Payer: Self-pay

## 2019-10-18 DIAGNOSIS — Z955 Presence of coronary angioplasty implant and graft: Secondary | ICD-10-CM | POA: Diagnosis not present

## 2019-10-18 NOTE — Progress Notes (Signed)
Daily Session Note  Patient Details  Name: Kelsey Carter MRN: [id number] Date of Birth: Mar 25, 1946 Referring Provider:     Cardiac Rehab from 08/24/2019 in [facility] Cardiac and Pulmonary Rehab  Referring Provider  Josephina Gip MD      Encounter Date: 10/18/2019  Check In: Session Check In - 10/18/19 1022      Check-In   Supervising physician immediately available to respond to emergencies  See telemetry face sheet for immediately available ER MD    Location  [facility]-Cardiac & Pulmonary Rehab    Staff Present  Heath Lark, RN, BSN, CCRP;Jeanna Durrell BS, Exercise Physiologist;Joseph Hood RCP,RRT,BSRT    Virtual Visit  No    Medication changes reported      No    Fall or balance concerns reported     No    Warm-up and Cool-down  Performed on first and last piece of equipment    Resistance Training Performed  Yes    VAD Patient?  No    PAD/SET Patient?  No      Pain Assessment   Currently in Pain?  No/denies          Social History   Tobacco Use  Smoking Status Never Smoker  Smokeless Tobacco Never Used    Goals Met:  Independence with exercise equipment Exercise tolerated well No report of cardiac concerns or symptoms  Goals Unmet:  Not Applicable  Comments: Pt able to follow exercise prescription today without complaint.  Will continue to monitor for progression.    Dr. Emily Filbert is Medical Director for [place] and [facility].

## 2019-10-23 ENCOUNTER — Other Ambulatory Visit: Payer: Self-pay

## 2019-10-23 ENCOUNTER — Encounter: Payer: PPO | Attending: Internal Medicine | Admitting: *Deleted

## 2019-10-23 DIAGNOSIS — Z955 Presence of coronary angioplasty implant and graft: Secondary | ICD-10-CM | POA: Diagnosis not present

## 2019-10-23 NOTE — Progress Notes (Signed)
Daily Session Note  Patient Details  Name: Kelsey Carter MRN: [id number] Date of Birth: 1946/10/04 Referring Provider:     Cardiac Rehab from 08/24/2019 in [facility] Cardiac and Pulmonary Rehab  Referring Provider  Josephina Gip MD      Encounter Date: 10/23/2019  Check In: Session Check In - 10/23/19 1023      Check-In   Supervising physician immediately available to respond to emergencies  See telemetry face sheet for immediately available ER MD    Location  [facility]-Cardiac & Pulmonary Rehab    Staff Present  Heath Lark, RN, BSN, CCRP;Joseph Hood RCP,RRT,BSRT;Kelly [place], [place], ACSM CEP, Exercise Physiologist    Virtual Visit  No    Medication changes reported      No    Fall or balance concerns reported     No    Warm-up and Cool-down  Performed on first and last piece of equipment    Resistance Training Performed  Yes    VAD Patient?  No    PAD/SET Patient?  No      Pain Assessment   Currently in Pain?  No/denies          Social History   Tobacco Use  Smoking Status Never Smoker  Smokeless Tobacco Never Used    Goals Met:  Independence with exercise equipment Exercise tolerated well No report of cardiac concerns or symptoms  Goals Unmet:  Not Applicable  Comments: Pt able to follow exercise prescription today without complaint.  Will continue to monitor for progression.    Dr. Emily Filbert is Medical Director for [place] and [facility].

## 2019-10-25 ENCOUNTER — Encounter: Payer: Self-pay | Admitting: *Deleted

## 2019-10-25 DIAGNOSIS — D649 Anemia, unspecified: Secondary | ICD-10-CM | POA: Diagnosis present

## 2019-10-25 DIAGNOSIS — Z9013 Acquired absence of bilateral breasts and nipples: Secondary | ICD-10-CM | POA: Diagnosis not present

## 2019-10-25 DIAGNOSIS — G620 Drug-induced polyneuropathy: Secondary | ICD-10-CM | POA: Diagnosis not present

## 2019-10-25 DIAGNOSIS — E782 Mixed hyperlipidemia: Secondary | ICD-10-CM | POA: Diagnosis not present

## 2019-10-25 DIAGNOSIS — T451X5A Adverse effect of antineoplastic and immunosuppressive drugs, initial encounter: Secondary | ICD-10-CM | POA: Diagnosis not present

## 2019-10-25 DIAGNOSIS — R7309 Other abnormal glucose: Secondary | ICD-10-CM | POA: Diagnosis not present

## 2019-10-25 DIAGNOSIS — I2510 Atherosclerotic heart disease of native coronary artery without angina pectoris: Secondary | ICD-10-CM | POA: Diagnosis not present

## 2019-10-25 DIAGNOSIS — I480 Paroxysmal atrial fibrillation: Secondary | ICD-10-CM | POA: Diagnosis not present

## 2019-10-25 DIAGNOSIS — M810 Age-related osteoporosis without current pathological fracture: Secondary | ICD-10-CM | POA: Diagnosis not present

## 2019-10-25 DIAGNOSIS — Z955 Presence of coronary angioplasty implant and graft: Secondary | ICD-10-CM

## 2019-10-25 DIAGNOSIS — Z7689 Persons encountering health services in other specified circumstances: Secondary | ICD-10-CM | POA: Diagnosis not present

## 2019-10-25 NOTE — Progress Notes (Signed)
Cardiac Individual Treatment Plan  Patient Details  Name: Kelsey Carter MRN: [id number] Date of Birth: 07/20/1946 Referring Provider:     Cardiac Rehab from 08/24/2019 in [facility]  Referring Provider  Kowalski, Burce MD      Initial Encounter Date:    Cardiac Rehab from 08/24/2019 in [facility]  Date  08/24/19      Visit Diagnosis: Status post coronary artery stent placement  Patient's Home Medications on Admission:  Current Outpatient Medications:  .  alendronate (FOSAMAX) 70 MG tablet, Take 1 tablet (70 mg total) by mouth once a week. (Patient taking differently: Take 70 mg by mouth every Wednesday. ), Disp: 4 tablet, Rfl: 6 .  ALPRAZolam (XANAX) 1 MG tablet, TAKE 1 TABLET(1 MG) BY MOUTH AT BEDTIME AS NEEDED FOR ANXIETY (Patient taking differently: Take 1 mg by mouth at bedtime. ), Disp: 30 tablet, Rfl: 0 .  aspirin EC 81 MG tablet, Take 81 mg by mouth daily., Disp: , Rfl:  .  atorvastatin (LIPITOR) 40 MG tablet, Take 40 mg by mouth at bedtime., Disp: , Rfl:  .  Cholecalciferol (VITAMIN D) 2000 units CAPS, Take 1 capsule (2,000 Units total) by mouth daily. (Patient taking differently: Take 4,000 capsules by mouth daily. ), Disp: 30 capsule, Rfl:  .  clopidogrel (PLAVIX) 75 MG tablet, Take 1 tablet (75 mg total) by mouth daily., Disp: 90 tablet, Rfl: 2 .  MAGNESIUM PO, Take 1 tablet by mouth daily., Disp: , Rfl:  .  metoprolol succinate (TOPROL-XL) 50 MG 24 hr tablet, Take 50 mg by mouth daily. , Disp: , Rfl:  .  Omega-3 Fatty Acids (FISH OIL) 1000 MG CAPS, Take 1,000 mg by mouth daily., Disp: , Rfl:  .  telmisartan (MICARDIS) 80 MG tablet, Take 1 tablet (80 mg total) by mouth daily., Disp: 90 tablet, Rfl: 3 .  vitamin C (ASCORBIC ACID) 250 MG tablet, Take 500 mg by mouth daily., Disp: , Rfl:   Past Medical History: Past Medical History:  Diagnosis Date  . Anemia    vitamin d deficiency  . Anxiety   . Arthritis    back, DDD  .  Breast cancer (HCC)    bilateral mastectomies.  different types of cancer in each breast  . Cardiomyopathy due to chemotherapy (HCC) 2011  . Chronic pain 2019  . Complication of anesthesia   . Coronary artery disease   . GERD (gastroesophageal reflux disease)   . Hyperlipidemia   . Hypertension   . Lumbar herniated disc   . Peripheral vascular disease (HCC)    neuropathies in extremities due to chemotherapy  . Personal history of chemotherapy   . Personal history of radiation therapy   . PONV (postoperative nausea and vomiting)     Tobacco Use: Social History   Tobacco Use  Smoking Status Never Smoker  Smokeless Tobacco Never Used    Labs: Recent Review Flowsheet Data    Labs for ITP Cardiac and Pulmonary Rehab Latest Ref Rng & Units 04/24/2013 08/24/2017 03/22/2018 09/21/2018   Cholestrol 0 - 200 mg/dL 160 117 - 130   LDLCALC 0 - 99 mg/dL 87 48 - 59   HDL >40 mg/dL 54 53 - 49   Trlycerides <150 mg/dL 97 82 - 108   Hemoglobin A1c 4.8 - 5.6 % - - 5.9(H) 5.6       Exercise Target Goals: Exercise Program Goal: Individual exercise prescription set using results from initial 6 min walk test   and THRR while considering  patient's activity barriers and safety.   Exercise Prescription Goal: Initial exercise prescription builds to 30-45 minutes a day of aerobic activity, 2-3 days per week.  Home exercise guidelines will be given to patient during program as part of exercise prescription that the participant will acknowledge.  Activity Barriers & Risk Stratification: Activity Barriers & Cardiac Risk Stratification - 08/24/19 1034      Activity Barriers & Cardiac Risk Stratification   Activity Barriers  Other (comment);Balance Concerns;Back Problems;Muscular Weakness;Deconditioning;Shortness of Breath    Comments  neuropathy in hands and feet (post chemo), spinal stenosis    Cardiac Risk Stratification  Moderate       6 Minute Walk: 6 Minute Walk    Row Name 08/24/19 1033          6 Minute Walk   Phase  Initial     Distance  1170 feet     Walk Time  6 minutes     # of Rest Breaks  0     MPH  2.22     METS  2.43     RPE  1     Perceived Dyspnea   1     VO2 Peak  8.54     Symptoms  Yes (comment)     Comments  SOB     Resting HR  70 bpm     Resting BP  112/56     Resting Oxygen Saturation   97 %     Exercise Oxygen Saturation  during 6 min walk  95 %     Max Ex. HR  105 bpm     Max Ex. BP  134/70     2 Minute Post BP  130/64        Oxygen Initial Assessment:   Oxygen Re-Evaluation:   Oxygen Discharge (Final Oxygen Re-Evaluation):   Initial Exercise Prescription: Initial Exercise Prescription - 08/24/19 1000      Date of Initial Exercise RX and Referring Provider   Date  08/24/19    Referring Provider  Josephina Gip MD      Treadmill   MPH  1.9    Grade  0    Minutes  15    METs  2.45      Elliptical   Level  2    Speed  1    Minutes  15      REL-XR   Level  1    Speed  50    Minutes  15    METs  2      T5 Nustep   Level  1    SPM  80    Minutes  15    METs  2      Prescription Details   Frequency (times per week)  2    Duration  Progress to 30 minutes of continuous aerobic without signs/symptoms of physical distress      Intensity   THRR 40-80% of Max Heartrate  101-132    Ratings of Perceived Exertion  11-13    Perceived Dyspnea  0-4      Progression   Progression  Continue to progress workloads to maintain intensity without signs/symptoms of physical distress.      Resistance Training   Training Prescription  Yes    Weight  3lbs    Reps  10-15       Perform Capillary Blood Glucose checks as needed.  Exercise Prescription Changes: Exercise Prescription Changes  Row Name 08/24/19 1000 08/30/19 1100 10/11/19 1000 10/11/19 1300       Response to Exercise   Blood Pressure (Admit)  112/56  124/84  -  118/60    Blood Pressure (Exercise)  134/70  146/72  -  138/72    Blood Pressure (Exit)  130/64   122/64  -  124/70    Heart Rate (Admit)  70 bpm  63 bpm  -  69 bpm    Heart Rate (Exercise)  105 bpm  131 bpm  -  105 bpm    Heart Rate (Exit)  74 bpm  85 bpm  -  75 bpm    Oxygen Saturation (Admit)  97 %  -  -  -    Oxygen Saturation (Exercise)  95 %  -  -  -    Rating of Perceived Exertion (Exercise)  11  14  -  12    Perceived Dyspnea (Exercise)  1  -  -  -    Symptoms  SOB  -  -  -    Comments  walk test results  first day  -  -    Duration  -  -  -  Progress to 30 minutes of  aerobic without signs/symptoms of physical distress    Intensity  -  -  -  THRR unchanged      Resistance Training   Training Prescription  -  -  -  Yes    Weight  -  -  -   3 lb    Reps  -  -  -  10-15      Treadmill   MPH  -  -  -  1.9    Grade  -  -  -  0    Minutes  -  -  -  15    METs  -  -  -  2.45      REL-XR   Level  -  -  -  1    Speed  -  -  -  50    Minutes  -  -  -  15    METs  -  -  -  3      Home Exercise Plan   Plans to continue exercise at  -  -  Home (comment) walking staff videos  Home (comment) walking staff videos    Frequency  -  -  Add 2 additional days to program exercise sessions.  Add 2 additional days to program exercise sessions.    Initial Home Exercises Provided  -  -  10/11/19  10/11/19       Exercise Comments: Exercise Comments    Row Name 08/30/19 1049 09/04/19 1056         Exercise Comments  First full day of exercise!  Patient was oriented to gym and equipment including functions, settings, policies, and procedures.  Patient's individual exercise prescription and treatment plan were reviewed.  All starting workloads were established based on the results of the 6 minute walk test done at initial orientation visit.  The plan for exercise progression was also introduced and progression will be customized based on patient's performance and goals.  [name] did not complete her rehab session.   After placing Collie Siad on the monitor- noted fast heartrate and  irregular rhythm.  Looking at previous strips she was in NSR.  Called MD office and she has appointment  to see PA/NP at 230. No exercise, Sent home to go to MD appt today. Advised if she feels any symptoms SOB, Chest pain/tightness to call 911.         Exercise Goals and Review: Exercise Goals    Row Name 08/24/19 1039             Exercise Goals   Increase Physical Activity  Yes       Intervention  Provide advice, education, support and counseling about physical activity/exercise needs.;Develop an individualized exercise prescription for aerobic and resistive training based on initial evaluation findings, risk stratification, comorbidities and participant's personal goals.       Expected Outcomes  Short Term: Attend rehab on a regular basis to increase amount of physical activity.;Long Term: Add in home exercise to make exercise part of routine and to increase amount of physical activity.;Long Term: Exercising regularly at least 3-5 days a week.       Increase Strength and Stamina  Yes       Intervention  Provide advice, education, support and counseling about physical activity/exercise needs.;Develop an individualized exercise prescription for aerobic and resistive training based on initial evaluation findings, risk stratification, comorbidities and participant's personal goals.       Expected Outcomes  Short Term: Increase workloads from initial exercise prescription for resistance, speed, and METs.;Short Term: Perform resistance training exercises routinely during rehab and add in resistance training at home;Long Term: Improve cardiorespiratory fitness, muscular endurance and strength as measured by increased METs and functional capacity (6MWT)       Able to understand and use rate of perceived exertion (RPE) scale  Yes       Intervention  Provide education and explanation on how to use RPE scale       Expected Outcomes  Short Term: Able to use RPE daily in rehab to express subjective  intensity level;Long Term:  Able to use RPE to guide intensity level when exercising independently       Able to understand and use Dyspnea scale  Yes       Intervention  Provide education and explanation on how to use Dyspnea scale       Expected Outcomes  Short Term: Able to use Dyspnea scale daily in rehab to express subjective sense of shortness of breath during exertion;Long Term: Able to use Dyspnea scale to guide intensity level when exercising independently       Knowledge and understanding of Target Heart Rate Range (THRR)  Yes       Intervention  Provide education and explanation of THRR including how the numbers were predicted and where they are located for reference       Expected Outcomes  Short Term: Able to state/look up THRR;Short Term: Able to use daily as guideline for intensity in rehab;Long Term: Able to use THRR to govern intensity when exercising independently       Able to check pulse independently  Yes       Intervention  Provide education and demonstration on how to check pulse in carotid and radial arteries.;Review the importance of being able to check your own pulse for safety during independent exercise       Expected Outcomes  Short Term: Able to explain why pulse checking is important during independent exercise;Long Term: Able to check pulse independently and accurately       Understanding of Exercise Prescription  Yes       Intervention  Provide education, explanation, and written materials  on patient's individual exercise prescription       Expected Outcomes  Short Term: Able to explain program exercise prescription;Long Term: Able to explain home exercise prescription to exercise independently          Exercise Goals Re-Evaluation : Exercise Goals Re-Evaluation    Row Name 08/30/19 1049 10/03/19 1001 10/11/19 1055         Exercise Goal Re-Evaluation   Exercise Goals Review  Increase Strength and Stamina;Increase Physical Activity;Able to understand and use  rate of perceived exertion (RPE) scale;Knowledge and understanding of Target Heart Rate Range (THRR);Understanding of Exercise Prescription  -  Increase Strength and Stamina;Increase Physical Activity;Able to understand and use rate of perceived exertion (RPE) scale;Knowledge and understanding of Target Heart Rate Range (THRR);Understanding of Exercise Prescription;Able to check pulse independently     Comments  Reviewed RPE scale, THR and program prescription with pt today.  Pt voiced understanding and was given a copy of goals to take home.  out since last review  Reviewed home exercise with pt today.  Pt plans to walking at home and staff videos for exercise.  Reviewed THR, pulse, RPE, sign and symptoms, and when to call 911 or MD.  Also discussed weather considerations and indoor options.  Pt voiced understanding.     Expected Outcomes  Short: Use RPE daily to regulate intensity. Long: Follow program prescription in THR.  -  Short: Start to walk at home.  Long: Exercise independently        Discharge Exercise Prescription (Final Exercise Prescription Changes): Exercise Prescription Changes - 10/11/19 1300      Response to Exercise   Blood Pressure (Admit)  118/60    Blood Pressure (Exercise)  138/72    Blood Pressure (Exit)  124/70    Heart Rate (Admit)  69 bpm    Heart Rate (Exercise)  105 bpm    Heart Rate (Exit)  75 bpm    Rating of Perceived Exertion (Exercise)  12    Duration  Progress to 30 minutes of  aerobic without signs/symptoms of physical distress    Intensity  THRR unchanged      Resistance Training   Training Prescription  Yes    Weight   3 lb    Reps  10-15      Treadmill   MPH  1.9    Grade  0    Minutes  15    METs  2.45      REL-XR   Level  1    Speed  50    Minutes  15    METs  3      Home Exercise Plan   Plans to continue exercise at  Home (comment)   walking staff videos   Frequency  Add 2 additional days to program exercise sessions.    Initial Home  Exercises Provided  10/11/19       Nutrition:  Target Goals: Understanding of nutrition guidelines, daily intake of sodium '1500mg'$ , cholesterol '200mg'$ , calories 30% from fat and 7% or less from saturated fats, daily to have 5 or more servings of fruits and vegetables.  Biometrics: Pre Biometrics - 08/24/19 1040      Pre Biometrics   Height  5' 4.1" (1.628 m)    Weight  170 lb (77.1 kg)    BMI (Calculated)  29.09    Single Leg Stand  1.56 seconds        Nutrition Therapy Plan and Nutrition Goals: Nutrition Therapy &  Goals - 08/24/19 1034      Nutrition Therapy   Diet  Low Na, HH eating    Protein (specify units)  65g    Fiber  25 grams    Whole Grain Foods  3 servings    Saturated Fats  12 max. grams    Fruits and Vegetables  5 servings/day    Sodium  1.5 grams      Personal Nutrition Goals   Nutrition Goal  ST: add more protein, make sure eating enough kcal LT: get more energy and strength    Comments  Pt reports eating raisin bread toast with some butter (WG) or instant oatmeal (pt reports the lower sugar one) or yogurt (pt reports 60kcal fruit one). Dinner is usually chicken (breast or thigh and skin) with vegetables (occasionally regular pasta , but small amount). Discussed protein and kcal needs and that she doesn't seem to be getting enough especially not enough to support exercise and muscle building. Talked about maybe a snack before and after exercise (pt wants to do a kind bar before and greek yogurt after). Talked about healthy snacking and how to balance snacks (fat and prtoein especially). Pt reports will sometimes have a yogurt in the middle of the day and will add jello or pudding cup if she feels like she isn't getting enough. Discussed HH eating and weight loss vs healthy outcomes. Discussed that we will review energy and strength levels during program to ensure she is getting enough.      Intervention Plan   Intervention  Prescribe, educate and counsel regarding  individualized specific dietary modifications aiming towards targeted core components such as weight, hypertension, lipid management, diabetes, heart failure and other comorbidities.;Nutrition handout(s) given to patient.    Expected Outcomes  Short Term Goal: Understand basic principles of dietary content, such as calories, fat, sodium, cholesterol and nutrients.;Short Term Goal: A plan has been developed with personal nutrition goals set during dietitian appointment.;Long Term Goal: Adherence to prescribed nutrition plan.       Nutrition Assessments: Nutrition Assessments - 08/24/19 1314      MEDFICTS Scores   Pre Score  15       Nutrition Goals Re-Evaluation: Nutrition Goals Re-Evaluation    Row Name 10/09/19 1050             Goals   Current Weight  168 lb 4.8 oz (76.3 kg)       Nutrition Goal  ST: add more protein, make sure eating enough kcal LT: get more energy and strength       Comment  Pt reports doing well, strength and energy is good, but pt reports having some heart problems she is working with her doctor to resolve. Pt reports adding a protein bar in between meals. Pt reports only hungry enough for two meals, discussed if energy and/or strength begins to deminish or not continue to imporve or if her weight falls too rapidly that we would have to implement high kcal and protein MNT as well as mechanical eating.       Expected Outcome  ST: add more protein, make sure eating enough kcal LT: get more energy and strength          Nutrition Goals Discharge (Final Nutrition Goals Re-Evaluation): Nutrition Goals Re-Evaluation - 10/09/19 1050      Goals   Current Weight  168 lb 4.8 oz (76.3 kg)    Nutrition Goal  ST: add more protein, make sure eating enough kcal LT:  get more energy and strength    Comment  Pt reports doing well, strength and energy is good, but pt reports having some heart problems she is working with her doctor to resolve. Pt reports adding a protein bar in  between meals. Pt reports only hungry enough for two meals, discussed if energy and/or strength begins to deminish or not continue to imporve or if her weight falls too rapidly that we would have to implement high kcal and protein MNT as well as mechanical eating.    Expected Outcome  ST: add more protein, make sure eating enough kcal LT: get more energy and strength       Psychosocial: Target Goals: Acknowledge presence or absence of significant depression and/or stress, maximize coping skills, provide positive support system. Participant is able to verbalize types and ability to use techniques and skills needed for reducing stress and depression.   Initial Review & Psychosocial Screening: Initial Psych Review & Screening - 08/23/19 1134      Initial Review   Current issues with  Current Sleep Concerns   sleepy throughout the day     Family Dynamics   Good Support System?  Yes      Barriers   Psychosocial barriers to participate in program  There are no identifiable barriers or psychosocial needs.;The patient should benefit from training in stress management and relaxation.      Screening Interventions   Interventions  Encouraged to exercise;To provide support and resources with identified psychosocial needs;Provide feedback about the scores to participant    Expected Outcomes  Short Term goal: Utilizing psychosocial counselor, staff and physician to assist with identification of specific Stressors or current issues interfering with healing process. Setting desired goal for each stressor or current issue identified.;Long Term Goal: Stressors or current issues are controlled or eliminated.;Short Term goal: Identification and review with participant of any Quality of Life or Depression concerns found by scoring the questionnaire.;Long Term goal: The participant improves quality of Life and PHQ9 Scores as seen by post scores and/or verbalization of changes       Quality of Life Scores:   Quality of Life - 08/24/19 1058      Quality of Life   Select  Quality of Life      Quality of Life Scores   Health/Function Pre  22.96 %    Socioeconomic Pre  27.14 %    Psych/Spiritual Pre  27.86 %    Family Pre  26.4 %    GLOBAL Pre  25.41 %      Scores of 19 and below usually indicate a poorer quality of life in these areas.  A difference of  2-3 points is a clinically meaningful difference.  A difference of 2-3 points in the total score of the Quality of Life Index has been associated with significant improvement in overall quality of life, self-image, physical symptoms, and general health in studies assessing change in quality of life.  PHQ-9: Recent Review Flowsheet Data    Depression screen [facility] 2/9 08/24/2019 09/21/2018 03/31/2018 03/03/2018 03/02/2018   Decreased Interest 0 0 0 0 0   Down, Depressed, Hopeless 0 0 0 0 0   PHQ - 2 Score 0 0 0 0 0   Altered sleeping 0 0 0 - -   Tired, decreased energy 1 0 0 - -   Change in appetite 0 0 0 - -   Feeling bad or failure about yourself  0 0 0 - -  Trouble concentrating 0 0 0 - -   Moving slowly or fidgety/restless 0 0 0 - -   Suicidal thoughts 0 0 0 - -   PHQ-9 Score 1 0 0 - -   Difficult doing work/chores Not difficult at all - Not difficult at all - -     Interpretation of Total Score  Total Score Depression Severity:  1-4 = Minimal depression, 5-9 = Mild depression, 10-14 = Moderate depression, 15-19 = Moderately severe depression, 20-27 = Severe depression   Psychosocial Evaluation and Intervention:   Psychosocial Re-Evaluation: Psychosocial Re-Evaluation    [place] Name 10/11/19 1034             Psychosocial Re-Evaluation   Current issues with  Current Sleep Concerns       Comments  Collie Siad says that she has changed primary doctors because she has neuropathy and her old doctor cut her medicine in half. She has to see her doctor yet to get her prescriptions in order. Her sleep has been ok but could be better. She takes a  lot of medicine and has to take thinners and she is coming off of some of them when her doctor tells her to.       Expected Outcomes  Short: attend class to reduce stress and improve sleep. Long: maintain medications to help sleep.       Interventions  Encouraged to attend Cardiac Rehabilitation for the exercise       Continue Psychosocial Services   Follow up required by staff          Psychosocial Discharge (Final Psychosocial Re-Evaluation): Psychosocial Re-Evaluation - 10/11/19 1034      Psychosocial Re-Evaluation   Current issues with  Current Sleep Concerns    Comments  Collie Siad says that she has changed primary doctors because she has neuropathy and her old doctor cut her medicine in half. She has to see her doctor yet to get her prescriptions in order. Her sleep has been ok but could be better. She takes a lot of medicine and has to take thinners and she is coming off of some of them when her doctor tells her to.    Expected Outcomes  Short: attend class to reduce stress and improve sleep. Long: maintain medications to help sleep.    Interventions  Encouraged to attend Cardiac Rehabilitation for the exercise    Continue Psychosocial Services   Follow up required by staff       Vocational Rehabilitation: Provide vocational rehab assistance to qualifying candidates.   Vocational Rehab Evaluation & Intervention: Vocational Rehab - 08/23/19 1136      Initial Vocational Rehab Evaluation & Intervention   Assessment shows need for Vocational Rehabilitation  No       Education: Education Goals: Education classes will be provided on a variety of topics geared toward better understanding of heart health and risk factor modification. Participant will state understanding/return demonstration of topics presented as noted by education test scores.  Learning Barriers/Preferences: Learning Barriers/Preferences - 08/23/19 1136      Learning Barriers/Preferences   Learning Barriers  None     Learning Preferences  None       Education Topics:  AED/CPR: - Group verbal and written instruction with the use of models to demonstrate the basic use of the AED with the basic ABC's of resuscitation.   General Nutrition Guidelines/Fats and Fiber: -Group instruction provided by verbal, written material, models and posters to present the general guidelines for heart healthy  nutrition. Gives an explanation and review of dietary fats and fiber.   Controlling Sodium/Reading Food Labels: -Group verbal and written material supporting the discussion of sodium use in heart healthy nutrition. Review and explanation with models, verbal and written materials for utilization of the food label.   Exercise Physiology & General Exercise Guidelines: - Group verbal and written instruction with models to review the exercise physiology of the cardiovascular system and associated critical values. Provides general exercise guidelines with specific guidelines to those with heart or lung disease.    Aerobic Exercise & Resistance Training: - Gives group verbal and written instruction on the various components of exercise. Focuses on aerobic and resistive training programs and the benefits of this training and how to safely progress through these programs..   Cardiac Rehab from 10/11/2019 in [facility] Cardiac and Pulmonary Rehab  Date  10/11/19  Educator  AS  Instruction Review Code  1- Verbalizes Understanding      Flexibility, Balance, Mind/Body Relaxation: Provides group verbal/written instruction on the benefits of flexibility and balance training, including mind/body exercise modes such as yoga, pilates and tai chi.  Demonstration and skill practice provided.   Stress and Anxiety: - Provides group verbal and written instruction about the health risks of elevated stress and causes of high stress.  Discuss the correlation between heart/lung disease and anxiety and treatment options. Review healthy ways to  manage with stress and anxiety.   Depression: - Provides group verbal and written instruction on the correlation between heart/lung disease and depressed mood, treatment options, and the stigmas associated with seeking treatment.   Anatomy & Physiology of the Heart: - Group verbal and written instruction and models provide basic cardiac anatomy and physiology, with the coronary electrical and arterial systems. Review of Valvular disease and Heart Failure   Cardiac Procedures: - Group verbal and written instruction to review commonly prescribed medications for heart disease. Reviews the medication, class of the drug, and side effects. Includes the steps to properly store meds and maintain the prescription regimen. (beta blockers and nitrates)   Cardiac Medications I: - Group verbal and written instruction to review commonly prescribed medications for heart disease. Reviews the medication, class of the drug, and side effects. Includes the steps to properly store meds and maintain the prescription regimen.   Cardiac Medications II: -Group verbal and written instruction to review commonly prescribed medications for heart disease. Reviews the medication, class of the drug, and side effects. (all other drug classes)   Cardiac Rehab from 10/11/2019 in [facility] Cardiac and Pulmonary Rehab  Date  10/11/19  Educator  SB  Instruction Review Code  1- Verbalizes Understanding       Go Sex-Intimacy & Heart Disease, Get SMART - Goal Setting: - Group verbal and written instruction through game format to discuss heart disease and the return to sexual intimacy. Provides group verbal and written material to discuss and apply goal setting through the application of the S.M.A.R.T. Method.   Other Matters of the Heart: - Provides group verbal, written materials and models to describe Stable Angina and Peripheral Artery. Includes description of the disease process and treatment options available to the  cardiac patient.   Exercise & Equipment Safety: - Individual verbal instruction and demonstration of equipment use and safety with use of the equipment.   Cardiac Rehab from 10/11/2019 in [facility] Cardiac and Pulmonary Rehab  Date  08/24/19  Educator  [facility]  Instruction Review Code  1- Verbalizes Understanding      Infection  Prevention: - Provides verbal and written material to individual with discussion of infection control including proper hand washing and proper equipment cleaning during exercise session.   Cardiac Rehab from 10/11/2019 in [facility] Cardiac and Pulmonary Rehab  Date  08/24/19  Educator  [facility]  Instruction Review Code  1- Verbalizes Understanding      Falls Prevention: - Provides verbal and written material to individual with discussion of falls prevention and safety.   Cardiac Rehab from 10/11/2019 in [facility] Cardiac and Pulmonary Rehab  Date  08/24/19  Educator  [facility]  Instruction Review Code  1- Verbalizes Understanding      Diabetes: - Individual verbal and written instruction to review signs/symptoms of diabetes, desired ranges of glucose level fasting, after meals and with exercise. Acknowledge that pre and post exercise glucose checks will be done for 3 sessions at entry of program.   Know Your Numbers and Risk Factors: -Group verbal and written instruction about important numbers in your health.  Discussion of what are risk factors and how they play a role in the disease process.  Review of Cholesterol, Blood Pressure, Diabetes, and BMI and the role they play in your overall health.   Cardiac Rehab from 10/11/2019 in [facility] Cardiac and Pulmonary Rehab  Date  10/11/19  Educator  SB  Instruction Review Code  1- Verbalizes Understanding      Sleep Hygiene: -Provides group verbal and written instruction about how sleep can affect your health.  Define sleep hygiene, discuss sleep cycles and impact of sleep habits. Review good sleep hygiene tips.    Other: -Provides  group and verbal instruction on various topics (see comments)   Knowledge Questionnaire Score: Knowledge Questionnaire Score - 08/24/19 1057      Knowledge Questionnaire Score   Pre Score  19/26 Education Focus: Angina, Sex, HF, Nutrition, Exercise       Core Components/Risk Factors/Patient Goals at Admission: Personal Goals and Risk Factors at Admission - 08/24/19 1041      Core Components/Risk Factors/Patient Goals on Admission    Weight Management  Yes;Weight Loss    Intervention  Weight Management: Develop a combined nutrition and exercise program designed to reach desired caloric intake, while maintaining appropriate intake of nutrient and fiber, sodium and fats, and appropriate energy expenditure required for the weight goal.;Weight Management: Provide education and appropriate resources to help participant work on and attain dietary goals.;Weight Management/Obesity: Establish reasonable short term and long term weight goals.    Admit Weight  170 lb (77.1 kg)    Goal Weight: Short Term  165 lb (74.8 kg)    Goal Weight: Long Term  160 lb (72.6 kg)    Expected Outcomes  Short Term: Continue to assess and modify interventions until short term weight is achieved;Long Term: Adherence to nutrition and physical activity/exercise program aimed toward attainment of established weight goal;Weight Loss: Understanding of general recommendations for a balanced deficit meal plan, which promotes 1-2 lb weight loss per week and includes a negative energy balance of [phone] kcal/d;Understanding recommendations for meals to include 15-35% energy as protein, 25-35% energy from fat, 35-60% energy from carbohydrates, less than '200mg'$  of dietary cholesterol, 20-35 gm of total fiber daily;Understanding of distribution of calorie intake throughout the day with the consumption of 4-5 meals/snacks    Improve shortness of breath with ADL's  Yes    Intervention  Provide education, individualized exercise plan and  daily activity instruction to help decrease symptoms of SOB with activities of daily living.  Expected Outcomes  Short Term: Improve cardiorespiratory fitness to achieve a reduction of symptoms when performing ADLs;Long Term: Be able to perform more ADLs without symptoms or delay the onset of symptoms    Hypertension  Yes    Intervention  Provide education on lifestyle modifcations including regular physical activity/exercise, weight management, moderate sodium restriction and increased consumption of fresh fruit, vegetables, and low fat dairy, alcohol moderation, and smoking cessation.;Monitor prescription use compliance.    Expected Outcomes  Short Term: Continued assessment and intervention until BP is < 140/45m HG in hypertensive participants. < 130/833mHG in hypertensive participants with diabetes, heart failure or chronic kidney disease.;Long Term: Maintenance of blood pressure at goal levels.    Lipids  Yes    Intervention  Provide education and support for participant on nutrition & aerobic/resistive exercise along with prescribed medications to achieve LDL '70mg'$ , HDL >'40mg'$ .    Expected Outcomes  Short Term: Participant states understanding of desired cholesterol values and is compliant with medications prescribed. Participant is following exercise prescription and nutrition guidelines.;Long Term: Cholesterol controlled with medications as prescribed, with individualized exercise RX and with personalized nutrition plan. Value goals: LDL < '70mg'$ , HDL > 40 mg.       Core Components/Risk Factors/Patient Goals Review:  Goals and Risk Factor Review    Row Name 10/11/19 1037             Core Components/Risk Factors/Patient Goals Review   Personal Goals Review  Weight Management/Obesity;Improve shortness of breath with ADL's;Lipids;Hypertension       Review  SuCollie Siadants to lose weight and so far she has lost about three pounds. She is taking her blood pressure medications everyday and her  statin. She wants to achieve a weight of 155 pounds. She is eating healthier and is ready to be lighter. Her shortness of breath is ok when she is cleaning and she watches when she gets short of breath.       Expected Outcomes  Short: lose 5 pounds in two weeks. Long: Weigh 155 pounds.          Core Components/Risk Factors/Patient Goals at Discharge (Final Review):  Goals and Risk Factor Review - 10/11/19 1037      Core Components/Risk Factors/Patient Goals Review   Personal Goals Review  Weight Management/Obesity;Improve shortness of breath with ADL's;Lipids;Hypertension    Review  SuCollie Siadants to lose weight and so far she has lost about three pounds. She is taking her blood pressure medications everyday and her statin. She wants to achieve a weight of 155 pounds. She is eating healthier and is ready to be lighter. Her shortness of breath is ok when she is cleaning and she watches when she gets short of breath.    Expected Outcomes  Short: lose 5 pounds in two weeks. Long: Weigh 155 pounds.       ITP Comments: ITP Comments    Row Name 08/23/19 1138 08/24/19 1032 08/30/19 0546 09/04/19 1056 09/12/19 1109   ITP Comments  Virtual initial orientation completed. [facility] EP/RD Orientation scheduled 9/3 at 9:30  Completed 6MWT, gym orientation, and RD evaluation. Initial ITP created and sent for review to Dr. MaEmily FilbertMedical Director.  30 Day review. Continue with ITP unless directed changes per Medical Director review.  New to program attended orientation  MaHAELYN FORGEYid not complete her rehab session.   After placing SuCollie Siadn the monitor- noted fast heartrate and irregular rhythm.  Looking at  previous strips she was in [facility].  Called MD office and she has appointment to see PA/NP at 230. No exercise, Sent home to go to MD appt today. Advised if she feels any symptoms SOB, Chest pain/tightness to call 911.  Called check up on pt since we sent her to doctor for afib.   She has started some new medications.  She has a follow up appt on Friday and will ask about returning to rehab then.   [place] Name 09/27/19 1117 10/03/19 1000 10/25/19 0613       ITP Comments  30 day review completed. ITP sent to Dr. Emily Filbert, Medical Director of Cardiac and Pulmonary Rehab. Continue with ITP unless changes are made by physician.  Department closed starting 10/2 until further notice by infection prevention and Health at Work teams for [place].  Called to check on pt.  She has been out since 9/9.  She was cleared to return to rehab on 10/5 and will be back next Monday.  30 day review completed. Continue with ITP sent to Dr. Emily Filbert, Medical Director of Cardiac and Pulmonary Rehab for review , changes as needed and signature.        Comments:

## 2019-10-27 ENCOUNTER — Encounter: Payer: PPO | Admitting: *Deleted

## 2019-10-27 ENCOUNTER — Other Ambulatory Visit: Payer: Self-pay

## 2019-10-27 DIAGNOSIS — Z955 Presence of coronary angioplasty implant and graft: Secondary | ICD-10-CM

## 2019-10-27 NOTE — Progress Notes (Signed)
Daily Session Note  Patient Details  Name: Kelsey Carter MRN: [id number] Date of Birth: 1946/12/07 Referring Provider:     Cardiac Rehab from 08/24/2019 in [facility] Cardiac and Pulmonary Rehab  Referring Provider  Josephina Gip MD      Encounter Date: 10/27/2019  Check In: Session Check In - 10/27/19 1011      Check-In   Supervising physician immediately available to respond to emergencies  See telemetry face sheet for immediately available ER MD    Location  [facility]-Cardiac & Pulmonary Rehab    Staff Present  Renita Papa, RN BSN;Jessica Oberlin, MA, RCEP, CCRP, CCET;Amanda Sommer, BA, ACSM CEP, Exercise Physiologist    Virtual Visit  No    Medication changes reported      No    Warm-up and Cool-down  Performed on first and last piece of equipment    Resistance Training Performed  Yes    VAD Patient?  No    PAD/SET Patient?  No      Pain Assessment   Currently in Pain?  No/denies          Social History   Tobacco Use  Smoking Status Never Smoker  Smokeless Tobacco Never Used    Goals Met:  Independence with exercise equipment Exercise tolerated well No report of cardiac concerns or symptoms Strength training completed today  Goals Unmet:  Not Applicable  Comments: Pt able to follow exercise prescription today without complaint.  Will continue to monitor for progression.    Dr. Emily Filbert is Medical Director for [place] and [facility].

## 2019-10-30 ENCOUNTER — Other Ambulatory Visit: Payer: Self-pay

## 2019-10-30 ENCOUNTER — Encounter: Payer: PPO | Admitting: *Deleted

## 2019-10-30 DIAGNOSIS — Z955 Presence of coronary angioplasty implant and graft: Secondary | ICD-10-CM

## 2019-10-30 NOTE — Progress Notes (Signed)
Daily Session Note  Patient Details  Name: Kelsey Carter MRN: [id number] Date of Birth: 06/23/1946 Referring Provider:     Cardiac Rehab from 08/24/2019 in [facility] Cardiac and Pulmonary Rehab  Referring Provider  Kowalski, Burce MD      Encounter Date: 10/30/2019  Check In: Session Check In - 10/30/19 1038      Check-In   Supervising physician immediately available to respond to emergencies  See telemetry face sheet for immediately available ER MD    Location  [facility]-Cardiac & Pulmonary Rehab    Staff Present  Susanne Bice, RN, BSN, CCRP;Meredith Craven, RN BSN;Kelly Hayes, BS, ACSM CEP, Exercise Physiologist    Virtual Visit  No    Medication changes reported      No    Fall or balance concerns reported     No    Warm-up and Cool-down  Performed on first and last piece of equipment    Resistance Training Performed  Yes    VAD Patient?  No    PAD/SET Patient?  No      Pain Assessment   Currently in Pain?  No/denies          Social History   Tobacco Use  Smoking Status Never Smoker  Smokeless Tobacco Never Used    Goals Met:  Independence with exercise equipment Exercise tolerated well No report of cardiac concerns or symptoms  Goals Unmet:  Not Applicable  Comments: Pt able to follow exercise prescription today without complaint.  Will continue to monitor for progression.    Dr. Mark Miller is Medical Director for [facility] and [facility]. 

## 2019-11-01 ENCOUNTER — Other Ambulatory Visit: Payer: Self-pay

## 2019-11-01 ENCOUNTER — Encounter: Payer: PPO | Admitting: *Deleted

## 2019-11-01 DIAGNOSIS — Z955 Presence of coronary angioplasty implant and graft: Secondary | ICD-10-CM | POA: Diagnosis not present

## 2019-11-01 NOTE — Progress Notes (Signed)
Daily Session Note  Patient Details  Name: Kelsey Carter MRN: [id number] Date of Birth: 08-Aug-1946 Referring Provider:     Cardiac Rehab from 08/24/2019 in [facility] Cardiac and Pulmonary Rehab  Referring Provider  Josephina Gip MD      Encounter Date: 11/01/2019  Check In: Session Check In - 11/01/19 1014      Check-In   Supervising physician immediately available to respond to emergencies  See telemetry face sheet for immediately available ER MD    Location  [facility]-Cardiac & Pulmonary Rehab    Staff Present  Heath Lark, RN, BSN, CCRP;Jeanna Durrell BS, Exercise Physiologist;Joseph Hood RCP,RRT,BSRT    Virtual Visit  No    Medication changes reported      No    Fall or balance concerns reported     No    Warm-up and Cool-down  Performed on first and last piece of equipment    Resistance Training Performed  Yes    VAD Patient?  No    PAD/SET Patient?  No      Pain Assessment   Currently in Pain?  No/denies          Social History   Tobacco Use  Smoking Status Never Smoker  Smokeless Tobacco Never Used    Goals Met:  Independence with exercise equipment Exercise tolerated well No report of cardiac concerns or symptoms  Goals Unmet:  Not Applicable  Comments: Pt able to follow exercise prescription today without complaint.  Will continue to monitor for progression.    Dr. Emily Filbert is Medical Director for [place] and [facility].

## 2019-11-06 ENCOUNTER — Encounter: Payer: PPO | Admitting: *Deleted

## 2019-11-06 ENCOUNTER — Other Ambulatory Visit: Payer: Self-pay

## 2019-11-06 DIAGNOSIS — Z955 Presence of coronary angioplasty implant and graft: Secondary | ICD-10-CM | POA: Diagnosis not present

## 2019-11-06 NOTE — Progress Notes (Signed)
Daily Session Note  Patient Details  Name: ONESHA KREBBS MRN: [id number] Date of Birth: 1946/01/23 Referring Provider:     Cardiac Rehab from 08/24/2019 in [facility] Cardiac and Pulmonary Rehab  Referring Provider  Josephina Gip MD      Encounter Date: 11/06/2019  Check In: Session Check In - 11/06/19 1023      Check-In   Supervising physician immediately available to respond to emergencies  See telemetry face sheet for immediately available ER MD    Location  [facility]-Cardiac & Pulmonary Rehab    Staff Present  Renita Papa, RN Moises Blood, BS, ACSM CEP, Exercise Physiologist;Joseph Tessie Fass RCP,RRT,BSRT    Virtual Visit  No    Medication changes reported      No    Fall or balance concerns reported     No    Warm-up and Cool-down  Performed on first and last piece of equipment    Resistance Training Performed  Yes    VAD Patient?  No    PAD/SET Patient?  No      Pain Assessment   Currently in Pain?  No/denies          Social History   Tobacco Use  Smoking Status Never Smoker  Smokeless Tobacco Never Used    Goals Met:  Independence with exercise equipment Exercise tolerated well No report of cardiac concerns or symptoms Strength training completed today  Goals Unmet:  Not Applicable  Comments: Pt able to follow exercise prescription today without complaint.  Will continue to monitor for progression.    Dr. Emily Filbert is Medical Director for [place] and [facility].

## 2019-11-08 ENCOUNTER — Encounter: Payer: PPO | Admitting: *Deleted

## 2019-11-08 ENCOUNTER — Other Ambulatory Visit: Payer: Self-pay

## 2019-11-08 DIAGNOSIS — Z955 Presence of coronary angioplasty implant and graft: Secondary | ICD-10-CM | POA: Diagnosis not present

## 2019-11-08 NOTE — Progress Notes (Signed)
Daily Session Note  Patient Details  Name: Kelsey Carter MRN: [id number] Date of Birth: 1946-10-04 Referring Provider:     Cardiac Rehab from 08/24/2019 in [facility] Cardiac and Pulmonary Rehab  Referring Provider  Josephina Gip MD      Encounter Date: 11/08/2019  Check In: Session Check In - 11/08/19 1012      Check-In   Supervising physician immediately available to respond to emergencies  See telemetry face sheet for immediately available ER MD    Location  [facility]-Cardiac & Pulmonary Rehab    Staff Present  Darel Hong, RN BSN;Leslie Castrejon RN, BSN;Susanne Bice, RN, BSN, CCRP    Virtual Visit  No    Medication changes reported      No    Fall or balance concerns reported     No    Warm-up and Cool-down  Performed on first and last piece of equipment    Resistance Training Performed  Yes    VAD Patient?  No    PAD/SET Patient?  No      Pain Assessment   Currently in Pain?  No/denies          Social History   Tobacco Use  Smoking Status Never Smoker  Smokeless Tobacco Never Used    Goals Met:  Independence with exercise equipment Exercise tolerated well No report of cardiac concerns or symptoms Strength training completed today  Goals Unmet:  Not Applicable  Comments: Pt able to follow exercise prescription today without complaint.  Will continue to monitor for progression.    Dr. Emily Filbert is Medical Director for [place] and [facility].

## 2019-11-20 ENCOUNTER — Other Ambulatory Visit: Payer: Self-pay

## 2019-11-20 ENCOUNTER — Encounter: Payer: PPO | Admitting: *Deleted

## 2019-11-20 DIAGNOSIS — Z955 Presence of coronary angioplasty implant and graft: Secondary | ICD-10-CM

## 2019-11-20 NOTE — Progress Notes (Signed)
Daily Session Note  Patient Details  Name: Kelsey Carter MRN: [id number] Date of Birth: 09/29/1946 Referring Provider:     Cardiac Rehab from 08/24/2019 in [facility] Cardiac and Pulmonary Rehab  Referring Provider  Kowalski, Burce MD      Encounter Date: 11/20/2019  Check In: Session Check In - 11/20/19 1023      Check-In   Supervising physician immediately available to respond to emergencies  See telemetry face sheet for immediately available ER MD    Location  [facility]-Cardiac & Pulmonary Rehab    Staff Present  Meredith Craven, RN BSN;Jessica Hawkins, MA, RCEP, CCRP, CCET;Kelly Hayes, BS, ACSM CEP, Exercise Physiologist    Virtual Visit  No    Medication changes reported      No    Fall or balance concerns reported     Yes    Comments  fell and bumped her knee, adjusting exercise as needed    Warm-up and Cool-down  Performed on first and last piece of equipment    Resistance Training Performed  Yes    VAD Patient?  No    PAD/SET Patient?  No      Pain Assessment   Currently in Pain?  No/denies          Social History   Tobacco Use  Smoking Status Never Smoker  Smokeless Tobacco Never Used    Goals Met:  Independence with exercise equipment Exercise tolerated well Personal goals reviewed No report of cardiac concerns or symptoms Strength training completed today  Goals Unmet:  Not Applicable  Comments: Pt able to follow exercise prescription today without complaint.  Will continue to monitor for progression.    Dr. Mark Miller is Medical Director for [facility] and [facility]. 

## 2019-11-22 ENCOUNTER — Encounter: Payer: Self-pay | Admitting: *Deleted

## 2019-11-22 DIAGNOSIS — Z955 Presence of coronary angioplasty implant and graft: Secondary | ICD-10-CM

## 2019-11-22 NOTE — Progress Notes (Signed)
Cardiac Individual Treatment Plan  Patient Details  Name: Kelsey Carter MRN: [id number] Date of Birth: 06/26/1946 Referring Provider:     Cardiac Rehab from 08/24/2019 in [facility]  Referring Provider  Kowalski, Burce MD      Initial Encounter Date:    Cardiac Rehab from 08/24/2019 in [facility]  Date  08/24/19      Visit Diagnosis: Status post coronary artery stent placement  Patient's Home Medications on Admission:  Current Outpatient Medications:  .  alendronate (FOSAMAX) 70 MG tablet, Take 1 tablet (70 mg total) by mouth once a week. (Patient taking differently: Take 70 mg by mouth every Wednesday. ), Disp: 4 tablet, Rfl: 6 .  ALPRAZolam (XANAX) 1 MG tablet, TAKE 1 TABLET(1 MG) BY MOUTH AT BEDTIME AS NEEDED FOR ANXIETY (Patient taking differently: Take 1 mg by mouth at bedtime. ), Disp: 30 tablet, Rfl: 0 .  aspirin EC 81 MG tablet, Take 81 mg by mouth daily., Disp: , Rfl:  .  atorvastatin (LIPITOR) 40 MG tablet, Take 40 mg by mouth at bedtime., Disp: , Rfl:  .  Cholecalciferol (VITAMIN D) 2000 units CAPS, Take 1 capsule (2,000 Units total) by mouth daily. (Patient taking differently: Take 4,000 capsules by mouth daily. ), Disp: 30 capsule, Rfl:  .  clopidogrel (PLAVIX) 75 MG tablet, Take 1 tablet (75 mg total) by mouth daily., Disp: 90 tablet, Rfl: 2 .  MAGNESIUM PO, Take 1 tablet by mouth daily., Disp: , Rfl:  .  metoprolol succinate (TOPROL-XL) 50 MG 24 hr tablet, Take 50 mg by mouth daily. , Disp: , Rfl:  .  Omega-3 Fatty Acids (FISH OIL) 1000 MG CAPS, Take 1,000 mg by mouth daily., Disp: , Rfl:  .  telmisartan (MICARDIS) 80 MG tablet, Take 1 tablet (80 mg total) by mouth daily., Disp: 90 tablet, Rfl: 3 .  vitamin C (ASCORBIC ACID) 250 MG tablet, Take 500 mg by mouth daily., Disp: , Rfl:   Past Medical History: Past Medical History:  Diagnosis Date  . Anemia    vitamin d deficiency  . Anxiety   . Arthritis    back, DDD  .  Breast cancer (HCC)    bilateral mastectomies.  different types of cancer in each breast  . Cardiomyopathy due to chemotherapy (HCC) 2011  . Chronic pain 2019  . Complication of anesthesia   . Coronary artery disease   . GERD (gastroesophageal reflux disease)   . Hyperlipidemia   . Hypertension   . Lumbar herniated disc   . Peripheral vascular disease (HCC)    neuropathies in extremities due to chemotherapy  . Personal history of chemotherapy   . Personal history of radiation therapy   . PONV (postoperative nausea and vomiting)     Tobacco Use: Social History   Tobacco Use  Smoking Status Never Smoker  Smokeless Tobacco Never Used    Labs: Recent Review Flowsheet Data    Labs for ITP Cardiac and Pulmonary Rehab Latest Ref Rng & Units 04/24/2013 08/24/2017 03/22/2018 09/21/2018   Cholestrol 0 - 200 mg/dL 160 117 - 130   LDLCALC 0 - 99 mg/dL 87 48 - 59   HDL >40 mg/dL 54 53 - 49   Trlycerides <150 mg/dL 97 82 - 108   Hemoglobin A1c 4.8 - 5.6 % - - 5.9(H) 5.6       Exercise Target Goals: Exercise Program Goal: Individual exercise prescription set using results from initial 6 min walk test   and THRR while considering  patient's activity barriers and safety.   Exercise Prescription Goal: Initial exercise prescription builds to 30-45 minutes a day of aerobic activity, 2-3 days per week.  Home exercise guidelines will be given to patient during program as part of exercise prescription that the participant will acknowledge.  Activity Barriers & Risk Stratification: Activity Barriers & Cardiac Risk Stratification - 08/24/19 1034      Activity Barriers & Cardiac Risk Stratification   Activity Barriers  Other (comment);Balance Concerns;Back Problems;Muscular Weakness;Deconditioning;Shortness of Breath    Comments  neuropathy in hands and feet (post chemo), spinal stenosis    Cardiac Risk Stratification  Moderate       6 Minute Walk: 6 Minute Walk    Row Name 08/24/19 1033          6 Minute Walk   Phase  Initial     Distance  1170 feet     Walk Time  6 minutes     # of Rest Breaks  0     MPH  2.22     METS  2.43     RPE  1     Perceived Dyspnea   1     VO2 Peak  8.54     Symptoms  Yes (comment)     Comments  SOB     Resting HR  70 bpm     Resting BP  112/56     Resting Oxygen Saturation   97 %     Exercise Oxygen Saturation  during 6 min walk  95 %     Max Ex. HR  105 bpm     Max Ex. BP  134/70     2 Minute Post BP  130/64        Oxygen Initial Assessment:   Oxygen Re-Evaluation:   Oxygen Discharge (Final Oxygen Re-Evaluation):   Initial Exercise Prescription: Initial Exercise Prescription - 08/24/19 1000      Date of Initial Exercise RX and Referring Provider   Date  08/24/19    Referring Provider  Josephina Gip MD      Treadmill   MPH  1.9    Grade  0    Minutes  15    METs  2.45      Elliptical   Level  2    Speed  1    Minutes  15      REL-XR   Level  1    Speed  50    Minutes  15    METs  2      T5 Nustep   Level  1    SPM  80    Minutes  15    METs  2      Prescription Details   Frequency (times per week)  2    Duration  Progress to 30 minutes of continuous aerobic without signs/symptoms of physical distress      Intensity   THRR 40-80% of Max Heartrate  101-132    Ratings of Perceived Exertion  11-13    Perceived Dyspnea  0-4      Progression   Progression  Continue to progress workloads to maintain intensity without signs/symptoms of physical distress.      Resistance Training   Training Prescription  Yes    Weight  3lbs    Reps  10-15       Perform Capillary Blood Glucose checks as needed.  Exercise Prescription Changes: Exercise Prescription Changes  Row Name 08/24/19 1000 08/30/19 1100 10/11/19 1000 10/11/19 1300 10/25/19 1100     Response to Exercise   Blood Pressure (Admit)  112/56  124/84  -  118/60  110/64   Blood Pressure (Exercise)  134/70  146/72  -  138/72  122/84   Blood  Pressure (Exit)  130/64  122/64  -  124/70  96/60   Heart Rate (Admit)  70 bpm  63 bpm  -  69 bpm  60 bpm   Heart Rate (Exercise)  105 bpm  131 bpm  -  105 bpm  96 bpm   Heart Rate (Exit)  74 bpm  85 bpm  -  75 bpm  66 bpm   Oxygen Saturation (Admit)  97 %  -  -  -  -   Oxygen Saturation (Exercise)  95 %  -  -  -  -   Rating of Perceived Exertion (Exercise)  11  14  -  12  13   Perceived Dyspnea (Exercise)  1  -  -  -  -   Symptoms  SOB  -  -  -  none   Comments  walk test results  first day  -  -  -   Duration  -  -  -  Progress to 30 minutes of  aerobic without signs/symptoms of physical distress  Continue with 30 min of aerobic exercise without signs/symptoms of physical distress.   Intensity  -  -  -  THRR unchanged  THRR unchanged     Progression   Progression  -  -  -  -  Continue to progress workloads to maintain intensity without signs/symptoms of physical distress.   Average METs  -  -  -  -  2.42     Resistance Training   Training Prescription  -  -  -  Yes  Yes   Weight  -  -  -   3 lb  3 lbs   Reps  -  -  -  10-15  10-15     Interval Training   Interval Training  -  -  -  -  No     Treadmill   MPH  -  -  -  1.9  1.9   Grade  -  -  -  0  0   Minutes  -  -  -  15  15   METs  -  -  -  2.45  2.45     REL-XR   Level  -  -  -  1  1   Speed  -  -  -  50  -   Minutes  -  -  -  15  15   METs  -  -  -  3  3     T5 Nustep   Level  -  -  -  -  1   Minutes  -  -  -  -  15   METs  -  -  -  -  1.8     Home Exercise Plan   Plans to continue exercise at  -  -  Home (comment) walking staff videos  Home (comment) walking staff videos  Home (comment) walking staff videos   Frequency  -  -  Add 2 additional days to program exercise sessions.  Add 2 additional days to program exercise sessions.  Add 2  additional days to program exercise sessions.   Initial Home Exercises Provided  -  -  10/11/19  10/11/19  10/11/19   Row Name 11/09/19 1500             Response to Exercise    Blood Pressure (Admit)  122/60       Blood Pressure (Exercise)  136/70       Blood Pressure (Exit)  122/62       Heart Rate (Admit)  83 bpm       Heart Rate (Exercise)  90 bpm       Heart Rate (Exit)  76 bpm       Rating of Perceived Exertion (Exercise)  15       Symptoms  none       Duration  Continue with 30 min of aerobic exercise without signs/symptoms of physical distress.       Intensity  THRR unchanged         Progression   Progression  Continue to progress workloads to maintain intensity without signs/symptoms of physical distress.       Average METs  2.85         Resistance Training   Training Prescription  Yes       Weight   3lb       Reps  10-15         Interval Training   Interval Training  No         Arm Ergometer   Level  1       Minutes  15       METs  2.1         REL-XR   Level  2       Speed  50       Minutes  15       METs  3.9         T5 Nustep   Level  3       Minutes  15       METs  1.8         Biostep-RELP   Level  2       SPM  50       Minutes  15       METs  2         Home Exercise Plan   Plans to continue exercise at  Home (comment) walking staff videos       Frequency  Add 2 additional days to program exercise sessions.       Initial Home Exercises Provided  10/11/19          Exercise Comments: Exercise Comments    Row Name 08/30/19 1049 09/04/19 1056         Exercise Comments  First full day of exercise!  Patient was oriented to gym and equipment including functions, settings, policies, and procedures.  Patient's individual exercise prescription and treatment plan were reviewed.  All starting workloads were established based on the results of the 6 minute walk test done at initial orientation visit.  The plan for exercise progression was also introduced and progression will be customized based on patient's performance and goals.  [name] did not complete her rehab session.   After placing Collie Siad on the monitor- noted fast  heartrate and irregular rhythm.  Looking at previous strips she was in NSR.  Called MD office and she has appointment to see PA/NP at 230. No exercise, Sent  home to go to MD appt today. Advised if she feels any symptoms SOB, Chest pain/tightness to call 911.         Exercise Goals and Review: Exercise Goals    Row Name 08/24/19 1039             Exercise Goals   Increase Physical Activity  Yes       Intervention  Provide advice, education, support and counseling about physical activity/exercise needs.;Develop an individualized exercise prescription for aerobic and resistive training based on initial evaluation findings, risk stratification, comorbidities and participant's personal goals.       Expected Outcomes  Short Term: Attend rehab on a regular basis to increase amount of physical activity.;Long Term: Add in home exercise to make exercise part of routine and to increase amount of physical activity.;Long Term: Exercising regularly at least 3-5 days a week.       Increase Strength and Stamina  Yes       Intervention  Provide advice, education, support and counseling about physical activity/exercise needs.;Develop an individualized exercise prescription for aerobic and resistive training based on initial evaluation findings, risk stratification, comorbidities and participant's personal goals.       Expected Outcomes  Short Term: Increase workloads from initial exercise prescription for resistance, speed, and METs.;Short Term: Perform resistance training exercises routinely during rehab and add in resistance training at home;Long Term: Improve cardiorespiratory fitness, muscular endurance and strength as measured by increased METs and functional capacity (6MWT)       Able to understand and use rate of perceived exertion (RPE) scale  Yes       Intervention  Provide education and explanation on how to use RPE scale       Expected Outcomes  Short Term: Able to use RPE daily in rehab to express  subjective intensity level;Long Term:  Able to use RPE to guide intensity level when exercising independently       Able to understand and use Dyspnea scale  Yes       Intervention  Provide education and explanation on how to use Dyspnea scale       Expected Outcomes  Short Term: Able to use Dyspnea scale daily in rehab to express subjective sense of shortness of breath during exertion;Long Term: Able to use Dyspnea scale to guide intensity level when exercising independently       Knowledge and understanding of Target Heart Rate Range (THRR)  Yes       Intervention  Provide education and explanation of THRR including how the numbers were predicted and where they are located for reference       Expected Outcomes  Short Term: Able to state/look up THRR;Short Term: Able to use daily as guideline for intensity in rehab;Long Term: Able to use THRR to govern intensity when exercising independently       Able to check pulse independently  Yes       Intervention  Provide education and demonstration on how to check pulse in carotid and radial arteries.;Review the importance of being able to check your own pulse for safety during independent exercise       Expected Outcomes  Short Term: Able to explain why pulse checking is important during independent exercise;Long Term: Able to check pulse independently and accurately       Understanding of Exercise Prescription  Yes       Intervention  Provide education, explanation, and written materials on patient's individual exercise prescription  Expected Outcomes  Short Term: Able to explain program exercise prescription;Long Term: Able to explain home exercise prescription to exercise independently          Exercise Goals Re-Evaluation : Exercise Goals Re-Evaluation    Row Name 08/30/19 1049 10/03/19 1001 10/11/19 1055 10/25/19 1151 11/09/19 1541     Exercise Goal Re-Evaluation   Exercise Goals Review  Increase Strength and Stamina;Increase Physical  Activity;Able to understand and use rate of perceived exertion (RPE) scale;Knowledge and understanding of Target Heart Rate Range (THRR);Understanding of Exercise Prescription  -  Increase Strength and Stamina;Increase Physical Activity;Able to understand and use rate of perceived exertion (RPE) scale;Knowledge and understanding of Target Heart Rate Range (THRR);Understanding of Exercise Prescription;Able to check pulse independently  Increase Physical Activity;Increase Strength and Stamina;Understanding of Exercise Prescription  Increase Physical Activity;Increase Strength and Stamina;Able to understand and use rate of perceived exertion (RPE) scale;Knowledge and understanding of Target Heart Rate Range (THRR);Able to check pulse independently;Understanding of Exercise Prescription   Comments  Reviewed RPE scale, THR and program prescription with pt today.  Pt voiced understanding and was given a copy of goals to take home.  out since last review  Reviewed home exercise with pt today.  Pt plans to walking at home and staff videos for exercise.  Reviewed THR, pulse, RPE, sign and symptoms, and when to call 911 or MD.  Also discussed weather considerations and indoor options.  Pt voiced understanding.  Collie Siad has returned to exericse.  She only had three visits during the month of October.  She was encouraged to attend regularly again.  We will continue to monitor her progress.  Collie Siad has been attending more regularly in November.  She is working in RPE 11-15.   Expected Outcomes  Short: Use RPE daily to regulate intensity. Long: Follow program prescription in THR.  -  Short: Start to walk at home.  Long: Exercise independently  Short: Attend rehab regularly.  Long: Continue to rebuild stamina.  Short - continue to attend consistently Long - improve overall MET level   Row Name 11/20/19 1026             Exercise Goal Re-Evaluation   Exercise Goals Review  Increase Physical Activity;Increase Strength and  Stamina;Able to understand and use rate of perceived exertion (RPE) scale;Knowledge and understanding of Target Heart Rate Range (THRR);Able to check pulse independently;Understanding of Exercise Prescription       Comments  Collie Siad reports that she is using a peddling machine on off days of rehab. She reported that in rehab some of her workloads are starting to seem easier. Guidelines on properly increasing workloads was given.       Expected Outcomes  Short: Increase workload on XR machine to level 3 next week. Long: Continue to gain strengh and stamina and increase MET levels.          Discharge Exercise Prescription (Final Exercise Prescription Changes): Exercise Prescription Changes - 11/09/19 1500      Response to Exercise   Blood Pressure (Admit)  122/60    Blood Pressure (Exercise)  136/70    Blood Pressure (Exit)  122/62    Heart Rate (Admit)  83 bpm    Heart Rate (Exercise)  90 bpm    Heart Rate (Exit)  76 bpm    Rating of Perceived Exertion (Exercise)  15    Symptoms  none    Duration  Continue with 30 min of aerobic exercise without signs/symptoms of physical distress.  Intensity  THRR unchanged      Progression   Progression  Continue to progress workloads to maintain intensity without signs/symptoms of physical distress.    Average METs  2.85      Resistance Training   Training Prescription  Yes    Weight   3lb    Reps  10-15      Interval Training   Interval Training  No      Arm Ergometer   Level  1    Minutes  15    METs  2.1      REL-XR   Level  2    Speed  50    Minutes  15    METs  3.9      T5 Nustep   Level  3    Minutes  15    METs  1.8      Biostep-RELP   Level  2    SPM  50    Minutes  15    METs  2      Home Exercise Plan   Plans to continue exercise at  Home (comment)   walking staff videos   Frequency  Add 2 additional days to program exercise sessions.    Initial Home Exercises Provided  10/11/19       Nutrition:  Target Goals:  Understanding of nutrition guidelines, daily intake of sodium '1500mg'$ , cholesterol '200mg'$ , calories 30% from fat and 7% or less from saturated fats, daily to have 5 or more servings of fruits and vegetables.  Biometrics: Pre Biometrics - 08/24/19 1040      Pre Biometrics   Height  5' 4.1" (1.628 m)    Weight  170 lb (77.1 kg)    BMI (Calculated)  29.09    Single Leg Stand  1.56 seconds        Nutrition Therapy Plan and Nutrition Goals: Nutrition Therapy & Goals - 08/24/19 1034      Nutrition Therapy   Diet  Low Na, HH eating    Protein (specify units)  65g    Fiber  25 grams    Whole Grain Foods  3 servings    Saturated Fats  12 max. grams    Fruits and Vegetables  5 servings/day    Sodium  1.5 grams      Personal Nutrition Goals   Nutrition Goal  ST: add more protein, make sure eating enough kcal LT: get more energy and strength    Comments  Pt reports eating raisin bread toast with some butter (WG) or instant oatmeal (pt reports the lower sugar one) or yogurt (pt reports 60kcal fruit one). Dinner is usually chicken (breast or thigh and skin) with vegetables (occasionally regular pasta , but small amount). Discussed protein and kcal needs and that she doesn't seem to be getting enough especially not enough to support exercise and muscle building. Talked about maybe a snack before and after exercise (pt wants to do a kind bar before and greek yogurt after). Talked about healthy snacking and how to balance snacks (fat and prtoein especially). Pt reports will sometimes have a yogurt in the middle of the day and will add jello or pudding cup if she feels like she isn't getting enough. Discussed HH eating and weight loss vs healthy outcomes. Discussed that we will review energy and strength levels during program to ensure she is getting enough.      Intervention Plan   Intervention  Prescribe, educate and counsel  regarding individualized specific dietary modifications aiming towards  targeted core components such as weight, hypertension, lipid management, diabetes, heart failure and other comorbidities.;Nutrition handout(s) given to patient.    Expected Outcomes  Short Term Goal: Understand basic principles of dietary content, such as calories, fat, sodium, cholesterol and nutrients.;Short Term Goal: A plan has been developed with personal nutrition goals set during dietitian appointment.;Long Term Goal: Adherence to prescribed nutrition plan.       Nutrition Assessments: Nutrition Assessments - 08/24/19 1314      MEDFICTS Scores   Pre Score  15       Nutrition Goals Re-Evaluation: Nutrition Goals Re-Evaluation    Row Name 10/09/19 1050 10/30/19 1016           Goals   Current Weight  168 lb 4.8 oz (76.3 kg)  164 lb (74.4 kg)      Nutrition Goal  ST: add more protein, make sure eating enough kcal LT: get more energy and strength  ST: add more protein, make sure eating enough kcal LT: get more energy and strength, get off medicince      Comment  Pt reports doing well, strength and energy is good, but pt reports having some heart problems she is working with her doctor to resolve. Pt reports adding a protein bar in between meals. Pt reports only hungry enough for two meals, discussed if energy and/or strength begins to deminish or not continue to imporve or if her weight falls too rapidly that we would have to implement high kcal and protein MNT as well as mechanical eating.  Pt reports now 162lbs from her scale - goal is 155lbs. Energy is ok, pt is doing more around the house. Pt still continuing to add protein with protein bars. pt reports strength is better than when we last spoke.      Expected Outcome  ST: add more protein, make sure eating enough kcal LT: get more energy and strength  ST: add more protein, make sure eating enough kcal LT: get more energy and strength         Nutrition Goals Discharge (Final Nutrition Goals Re-Evaluation): Nutrition Goals  Re-Evaluation - 10/30/19 1016      Goals   Current Weight  164 lb (74.4 kg)    Nutrition Goal  ST: add more protein, make sure eating enough kcal LT: get more energy and strength, get off medicince    Comment  Pt reports now 162lbs from her scale - goal is 155lbs. Energy is ok, pt is doing more around the house. Pt still continuing to add protein with protein bars. pt reports strength is better than when we last spoke.    Expected Outcome  ST: add more protein, make sure eating enough kcal LT: get more energy and strength       Psychosocial: Target Goals: Acknowledge presence or absence of significant depression and/or stress, maximize coping skills, provide positive support system. Participant is able to verbalize types and ability to use techniques and skills needed for reducing stress and depression.   Initial Review & Psychosocial Screening: Initial Psych Review & Screening - 08/23/19 1134      Initial Review   Current issues with  Current Sleep Concerns   sleepy throughout the day     Family Dynamics   Good Support System?  Yes      Barriers   Psychosocial barriers to participate in program  There are no identifiable barriers or psychosocial needs.;The patient should benefit from  training in stress management and relaxation.      Screening Interventions   Interventions  Encouraged to exercise;To provide support and resources with identified psychosocial needs;Provide feedback about the scores to participant    Expected Outcomes  Short Term goal: Utilizing psychosocial counselor, staff and physician to assist with identification of specific Stressors or current issues interfering with healing process. Setting desired goal for each stressor or current issue identified.;Long Term Goal: Stressors or current issues are controlled or eliminated.;Short Term goal: Identification and review with participant of any Quality of Life or Depression concerns found by scoring the questionnaire.;Long  Term goal: The participant improves quality of Life and PHQ9 Scores as seen by post scores and/or verbalization of changes       Quality of Life Scores:  Quality of Life - 08/24/19 1058      Quality of Life   Select  Quality of Life      Quality of Life Scores   Health/Function Pre  22.96 %    Socioeconomic Pre  27.14 %    Psych/Spiritual Pre  27.86 %    Family Pre  26.4 %    GLOBAL Pre  25.41 %      Scores of 19 and below usually indicate a poorer quality of life in these areas.  A difference of  2-3 points is a clinically meaningful difference.  A difference of 2-3 points in the total score of the Quality of Life Index has been associated with significant improvement in overall quality of life, self-image, physical symptoms, and general health in studies assessing change in quality of life.  PHQ-9: Recent Review Flowsheet Data    Depression screen [facility] 2/9 08/24/2019 09/21/2018 03/31/2018 03/03/2018 03/02/2018   Decreased Interest 0 0 0 0 0   Down, Depressed, Hopeless 0 0 0 0 0   PHQ - 2 Score 0 0 0 0 0   Altered sleeping 0 0 0 - -   Tired, decreased energy 1 0 0 - -   Change in appetite 0 0 0 - -   Feeling bad or failure about yourself  0 0 0 - -   Trouble concentrating 0 0 0 - -   Moving slowly or fidgety/restless 0 0 0 - -   Suicidal thoughts 0 0 0 - -   PHQ-9 Score 1 0 0 - -   Difficult doing work/chores Not difficult at all - Not difficult at all - -     Interpretation of Total Score  Total Score Depression Severity:  1-4 = Minimal depression, 5-9 = Mild depression, 10-14 = Moderate depression, 15-19 = Moderately severe depression, 20-27 = Severe depression   Psychosocial Evaluation and Intervention:   Psychosocial Re-Evaluation: Psychosocial Re-Evaluation    Row Name 10/11/19 1034 11/20/19 1028           Psychosocial Re-Evaluation   Current issues with  Current Sleep Concerns  None Identified      Comments  Collie Siad says that she has changed primary doctors because  she has neuropathy and her old doctor cut her medicine in half. She has to see her doctor yet to get her prescriptions in order. Her sleep has been ok but could be better. She takes a lot of medicine and has to take thinners and she is coming off of some of them when her doctor tells her to.  Collie Siad reports no new stress concerns,  sleeping well, and having a strong support system. She is still working with  her doctors on the amount of blood thinners she is taking and may come off of one at her next appointment.      Expected Outcomes  Short: attend class to reduce stress and improve sleep. Long: maintain medications to help sleep.  Short: attend class to reduce stress. Long: maintain good mental health with healthy habbits.      Interventions  Encouraged to attend Cardiac Rehabilitation for the exercise  -      Continue Psychosocial Services   Follow up required by staff  Follow up required by staff         Psychosocial Discharge (Final Psychosocial Re-Evaluation): Psychosocial Re-Evaluation - 11/20/19 1028      Psychosocial Re-Evaluation   Current issues with  None Identified    Comments  Collie Siad reports no new stress concerns,  sleeping well, and having a strong support system. She is still working with her doctors on the amount of blood thinners she is taking and may come off of one at her next appointment.    Expected Outcomes  Short: attend class to reduce stress. Long: maintain good mental health with healthy habbits.    Continue Psychosocial Services   Follow up required by staff       Vocational Rehabilitation: Provide vocational rehab assistance to qualifying candidates.   Vocational Rehab Evaluation & Intervention: Vocational Rehab - 08/23/19 1136      Initial Vocational Rehab Evaluation & Intervention   Assessment shows need for Vocational Rehabilitation  No       Education: Education Goals: Education classes will be provided on a variety of topics geared toward better  understanding of heart health and risk factor modification. Participant will state understanding/return demonstration of topics presented as noted by education test scores.  Learning Barriers/Preferences: Learning Barriers/Preferences - 08/23/19 1136      Learning Barriers/Preferences   Learning Barriers  None    Learning Preferences  None       Education Topics:  AED/CPR: - Group verbal and written instruction with the use of models to demonstrate the basic use of the AED with the basic ABC's of resuscitation.   General Nutrition Guidelines/Fats and Fiber: -Group instruction provided by verbal, written material, models and posters to present the general guidelines for heart healthy nutrition. Gives an explanation and review of dietary fats and fiber.   Controlling Sodium/Reading Food Labels: -Group verbal and written material supporting the discussion of sodium use in heart healthy nutrition. Review and explanation with models, verbal and written materials for utilization of the food label.   Exercise Physiology & General Exercise Guidelines: - Group verbal and written instruction with models to review the exercise physiology of the cardiovascular system and associated critical values. Provides general exercise guidelines with specific guidelines to those with heart or lung disease.    Aerobic Exercise & Resistance Training: - Gives group verbal and written instruction on the various components of exercise. Focuses on aerobic and resistive training programs and the benefits of this training and how to safely progress through these programs..   Cardiac Rehab from 10/11/2019 in [facility] Cardiac and Pulmonary Rehab  Date  10/11/19  Educator  AS  Instruction Review Code  1- Verbalizes Understanding      Flexibility, Balance, Mind/Body Relaxation: Provides group verbal/written instruction on the benefits of flexibility and balance training, including mind/body exercise modes such as  yoga, pilates and tai chi.  Demonstration and skill practice provided.   Stress and Anxiety: - Provides group verbal and written  instruction about the health risks of elevated stress and causes of high stress.  Discuss the correlation between heart/lung disease and anxiety and treatment options. Review healthy ways to manage with stress and anxiety.   Depression: - Provides group verbal and written instruction on the correlation between heart/lung disease and depressed mood, treatment options, and the stigmas associated with seeking treatment.   Anatomy & Physiology of the Heart: - Group verbal and written instruction and models provide basic cardiac anatomy and physiology, with the coronary electrical and arterial systems. Review of Valvular disease and Heart Failure   Cardiac Procedures: - Group verbal and written instruction to review commonly prescribed medications for heart disease. Reviews the medication, class of the drug, and side effects. Includes the steps to properly store meds and maintain the prescription regimen. (beta blockers and nitrates)   Cardiac Medications I: - Group verbal and written instruction to review commonly prescribed medications for heart disease. Reviews the medication, class of the drug, and side effects. Includes the steps to properly store meds and maintain the prescription regimen.   Cardiac Medications II: -Group verbal and written instruction to review commonly prescribed medications for heart disease. Reviews the medication, class of the drug, and side effects. (all other drug classes)   Cardiac Rehab from 10/11/2019 in [facility] Cardiac and Pulmonary Rehab  Date  10/11/19  Educator  SB  Instruction Review Code  1- Verbalizes Understanding       Go Sex-Intimacy & Heart Disease, Get SMART - Goal Setting: - Group verbal and written instruction through game format to discuss heart disease and the return to sexual intimacy. Provides group verbal and  written material to discuss and apply goal setting through the application of the S.M.A.R.T. Method.   Other Matters of the Heart: - Provides group verbal, written materials and models to describe Stable Angina and Peripheral Artery. Includes description of the disease process and treatment options available to the cardiac patient.   Exercise & Equipment Safety: - Individual verbal instruction and demonstration of equipment use and safety with use of the equipment.   Cardiac Rehab from 10/11/2019 in [facility] Cardiac and Pulmonary Rehab  Date  08/24/19  Educator  [facility]  Instruction Review Code  1- Verbalizes Understanding      Infection Prevention: - Provides verbal and written material to individual with discussion of infection control including proper hand washing and proper equipment cleaning during exercise session.   Cardiac Rehab from 10/11/2019 in [facility] Cardiac and Pulmonary Rehab  Date  08/24/19  Educator  [facility]  Instruction Review Code  1- Verbalizes Understanding      Falls Prevention: - Provides verbal and written material to individual with discussion of falls prevention and safety.   Cardiac Rehab from 10/11/2019 in [facility] Cardiac and Pulmonary Rehab  Date  08/24/19  Educator  [facility]  Instruction Review Code  1- Verbalizes Understanding      Diabetes: - Individual verbal and written instruction to review signs/symptoms of diabetes, desired ranges of glucose level fasting, after meals and with exercise. Acknowledge that pre and post exercise glucose checks will be done for 3 sessions at entry of program.   Know Your Numbers and Risk Factors: -Group verbal and written instruction about important numbers in your health.  Discussion of what are risk factors and how they play a role in the disease process.  Review of Cholesterol, Blood Pressure, Diabetes, and BMI and the role they play in your overall health.   Cardiac Rehab from 10/11/2019 in  [place] Cardiac and Pulmonary Rehab  Date   10/11/19  Educator  SB  Instruction Review Code  1- Verbalizes Understanding      Sleep Hygiene: -Provides group verbal and written instruction about how sleep can affect your health.  Define sleep hygiene, discuss sleep cycles and impact of sleep habits. Review good sleep hygiene tips.    Other: -Provides group and verbal instruction on various topics (see comments)   Knowledge Questionnaire Score: Knowledge Questionnaire Score - 08/24/19 1057      Knowledge Questionnaire Score   Pre Score  19/26 Education Focus: Angina, Sex, HF, Nutrition, Exercise       Core Components/Risk Factors/Patient Goals at Admission: Personal Goals and Risk Factors at Admission - 08/24/19 1041      Core Components/Risk Factors/Patient Goals on Admission    Weight Management  Yes;Weight Loss    Intervention  Weight Management: Develop a combined nutrition and exercise program designed to reach desired caloric intake, while maintaining appropriate intake of nutrient and fiber, sodium and fats, and appropriate energy expenditure required for the weight goal.;Weight Management: Provide education and appropriate resources to help participant work on and attain dietary goals.;Weight Management/Obesity: Establish reasonable short term and long term weight goals.    Admit Weight  170 lb (77.1 kg)    Goal Weight: Short Term  165 lb (74.8 kg)    Goal Weight: Long Term  160 lb (72.6 kg)    Expected Outcomes  Short Term: Continue to assess and modify interventions until short term weight is achieved;Long Term: Adherence to nutrition and physical activity/exercise program aimed toward attainment of established weight goal;Weight Loss: Understanding of general recommendations for a balanced deficit meal plan, which promotes 1-2 lb weight loss per week and includes a negative energy balance of [phone] kcal/d;Understanding recommendations for meals to include 15-35% energy as protein, 25-35% energy from fat, 35-60% energy  from carbohydrates, less than '200mg'$  of dietary cholesterol, 20-35 gm of total fiber daily;Understanding of distribution of calorie intake throughout the day with the consumption of 4-5 meals/snacks    Improve shortness of breath with ADL's  Yes    Intervention  Provide education, individualized exercise plan and daily activity instruction to help decrease symptoms of SOB with activities of daily living.    Expected Outcomes  Short Term: Improve cardiorespiratory fitness to achieve a reduction of symptoms when performing ADLs;Long Term: Be able to perform more ADLs without symptoms or delay the onset of symptoms    Hypertension  Yes    Intervention  Provide education on lifestyle modifcations including regular physical activity/exercise, weight management, moderate sodium restriction and increased consumption of fresh fruit, vegetables, and low fat dairy, alcohol moderation, and smoking cessation.;Monitor prescription use compliance.    Expected Outcomes  Short Term: Continued assessment and intervention until BP is < 140/19m HG in hypertensive participants. < 130/839mHG in hypertensive participants with diabetes, heart failure or chronic kidney disease.;Long Term: Maintenance of blood pressure at goal levels.    Lipids  Yes    Intervention  Provide education and support for participant on nutrition & aerobic/resistive exercise along with prescribed medications to achieve LDL '70mg'$ , HDL >'40mg'$ .    Expected Outcomes  Short Term: Participant states understanding of desired cholesterol values and is compliant with medications prescribed. Participant is following exercise prescription and nutrition guidelines.;Long Term: Cholesterol controlled with medications as prescribed, with individualized exercise RX and with personalized nutrition plan. Value goals: LDL < '70mg'$ , HDL > 40 mg.  Core Components/Risk Factors/Patient Goals Review:  Goals and Risk Factor Review    Row Name 10/11/19 1037 11/20/19 1040            Core Components/Risk Factors/Patient Goals Review   Personal Goals Review  Weight Management/Obesity;Improve shortness of breath with ADL's;Lipids;Hypertension  Weight Management/Obesity;Improve shortness of breath with ADL's;Lipids;Hypertension      Review  Collie Siad wants to lose weight and so far she has lost about three pounds. She is taking her blood pressure medications everyday and her statin. She wants to achieve a weight of 155 pounds. She is eating healthier and is ready to be lighter. Her shortness of breath is ok when she is cleaning and she watches when she gets short of breath.  Collie Siad reports that she is still losing weight and has met the goal of losing 5 lbs in 2 weeks. She plans to continue her exercise and eating habits to eventually reach goal weight of 155 lbs. Collie Siad reports her shortness of breath has impoved and she only gets short of breath when walking up a hill, but otherwise can accomplish most of her daily routine. Collie Siad reports taking all meds to control hypertension and lipies.      Expected Outcomes  Short: lose 5 pounds in two weeks. Long: Weigh 155 pounds.  Short: continue to lose 1-2 pounds a week. Long: weigh 155 lbs.         Core Components/Risk Factors/Patient Goals at Discharge (Final Review):  Goals and Risk Factor Review - 11/20/19 1040      Core Components/Risk Factors/Patient Goals Review   Personal Goals Review  Weight Management/Obesity;Improve shortness of breath with ADL's;Lipids;Hypertension    Review  Collie Siad reports that she is still losing weight and has met the goal of losing 5 lbs in 2 weeks. She plans to continue her exercise and eating habits to eventually reach goal weight of 155 lbs. Collie Siad reports her shortness of breath has impoved and she only gets short of breath when walking up a hill, but otherwise can accomplish most of her daily routine. Collie Siad reports taking all meds to control hypertension and lipies.    Expected Outcomes  Short: continue to lose  1-2 pounds a week. Long: weigh 155 lbs.       ITP Comments: ITP Comments    Row Name 08/23/19 1138 08/24/19 1032 08/30/19 0546 09/04/19 1056 09/12/19 1109   ITP Comments  Virtual initial orientation completed. Diagnosis can be found in [facility] 8/12. EP/RD Orientation scheduled 9/3 at 9:30  Completed 6MWT, gym orientation, and RD evaluation. Initial ITP created and sent for review to Dr. Emily Filbert, Medical Director.  30 Day review. Continue with ITP unless directed changes per Medical Director review.  New to program attended orientation  [facility] SHIMMEL did not complete her rehab session.   After placing Collie Siad on the monitor- noted fast heartrate and irregular rhythm.  Looking at previous strips she was in NSR.  Called MD office and she has appointment to see PA/NP at 230. No exercise, Sent home to go to MD appt today. Advised if she feels any symptoms SOB, Chest pain/tightness to call 911.  Called check up on pt since we sent her to doctor for afib.  She has started some new medications.  She has a follow up appt on Friday and will ask about returning to rehab then.   [place] Name 09/27/19 1117 10/03/19 1000 10/25/19 0613 11/22/19 0926     ITP Comments  30 day review  completed. ITP sent to Dr. Emily Filbert, Medical Director of Cardiac and Pulmonary Rehab. Continue with ITP unless changes are made by physician.  Department closed starting 10/2 until further notice by infection prevention and Health at Work teams for [place].  Called to check on pt.  She has been out since 9/9.  She was cleared to return to rehab on 10/5 and will be back next Monday.  30 day review completed. Continue with ITP sent to Dr. Emily Filbert, Medical Director of Cardiac and Pulmonary Rehab for review , changes as needed and signature.  30 day review competed . ITP sent to Dr Emily Filbert for review, changes as needed and ITP approval signature.       Comments:

## 2019-11-24 ENCOUNTER — Other Ambulatory Visit: Payer: Self-pay

## 2019-11-24 ENCOUNTER — Encounter: Payer: PPO | Attending: Internal Medicine | Admitting: *Deleted

## 2019-11-24 DIAGNOSIS — Z955 Presence of coronary angioplasty implant and graft: Secondary | ICD-10-CM | POA: Insufficient documentation

## 2019-11-24 NOTE — Progress Notes (Signed)
Daily Session Note  Patient Details  Name: Kelsey Carter MRN: [id number] Date of Birth: 06/12/1946 Referring Provider:     Cardiac Rehab from 08/24/2019 in [facility] Cardiac and Pulmonary Rehab  Referring Provider  Kowalski, Burce MD      Encounter Date: 11/24/2019  Check In: Session Check In - 11/24/19 1036      Check-In   Supervising physician immediately available to respond to emergencies  See telemetry face sheet for immediately available ER MD    Location  [facility]-Cardiac & Pulmonary Rehab    Staff Present  Susanne Bice, RN, BSN, CCRP;Jessica Hawkins, MA, RCEP, CCRP, CCET    Virtual Visit  No    Medication changes reported      No    Fall or balance concerns reported     No    Warm-up and Cool-down  Performed on first and last piece of equipment    Resistance Training Performed  Yes    VAD Patient?  No    PAD/SET Patient?  No      Pain Assessment   Currently in Pain?  No/denies          Social History   Tobacco Use  Smoking Status Never Smoker  Smokeless Tobacco Never Used    Goals Met:  Independence with exercise equipment Exercise tolerated well No report of cardiac concerns or symptoms  Goals Unmet:  Not Applicable  Comments: Pt able to follow exercise prescription today without complaint.  Will continue to monitor for progression.    Dr. Mark Miller is Medical Director for [facility] and [facility]. 

## 2019-11-27 ENCOUNTER — Other Ambulatory Visit: Payer: Self-pay

## 2019-11-27 ENCOUNTER — Encounter: Payer: PPO | Admitting: *Deleted

## 2019-11-27 DIAGNOSIS — Z955 Presence of coronary angioplasty implant and graft: Secondary | ICD-10-CM | POA: Diagnosis not present

## 2019-11-27 NOTE — Progress Notes (Signed)
Daily Session Note  Patient Details  Name: Kelsey Carter MRN: [id number] Date of Birth: Jan 10, 1946 Referring Provider:     Cardiac Rehab from 08/24/2019 in [facility] Cardiac and Pulmonary Rehab  Referring Provider  Josephina Gip MD      Encounter Date: 11/27/2019  Check In: Session Check In - 11/27/19 1025      Check-In   Supervising physician immediately available to respond to emergencies  See telemetry face sheet for immediately available ER MD    Location  [facility]-Cardiac & Pulmonary Rehab    Staff Present  Renita Papa, RN Moises Blood, BS, ACSM CEP, Exercise Physiologist;Joseph Tessie Fass RCP,RRT,BSRT    Virtual Visit  No    Medication changes reported      No    Fall or balance concerns reported     No    Warm-up and Cool-down  Performed on first and last piece of equipment    Resistance Training Performed  Yes    VAD Patient?  No    PAD/SET Patient?  No      Pain Assessment   Currently in Pain?  No/denies          Social History   Tobacco Use  Smoking Status Never Smoker  Smokeless Tobacco Never Used    Goals Met:  Independence with exercise equipment Exercise tolerated well No report of cardiac concerns or symptoms Strength training completed today  Goals Unmet:  Not Applicable  Comments: Pt able to follow exercise prescription today without complaint.  Will continue to monitor for progression.    Dr. Emily Filbert is Medical Director for [place] and [facility].

## 2019-11-29 ENCOUNTER — Encounter: Payer: PPO | Admitting: *Deleted

## 2019-11-29 ENCOUNTER — Other Ambulatory Visit: Payer: Self-pay

## 2019-11-29 DIAGNOSIS — Z955 Presence of coronary angioplasty implant and graft: Secondary | ICD-10-CM | POA: Diagnosis not present

## 2019-11-29 NOTE — Progress Notes (Signed)
Daily Session Note  Patient Details  Name: Kelsey Carter MRN: [id number] Date of Birth: Mar 20, 1946 Referring Provider:     Cardiac Rehab from 08/24/2019 in [facility] Cardiac and Pulmonary Rehab  Referring Provider  Josephina Gip MD      Encounter Date: 11/29/2019  Check In: Session Check In - 11/29/19 1045      Check-In   Supervising physician immediately available to respond to emergencies  See telemetry face sheet for immediately available ER MD    Location  [facility]-Cardiac & Pulmonary Rehab    Staff Present  Renita Papa, RN BSN;Jeanna Durrell BS, Exercise Physiologist;Joseph Tessie Fass RCP,RRT,BSRT    Virtual Visit  No    Medication changes reported      No    Fall or balance concerns reported     No    Warm-up and Cool-down  Performed on first and last piece of equipment    Resistance Training Performed  Yes    VAD Patient?  No    PAD/SET Patient?  No      Pain Assessment   Currently in Pain?  No/denies          Social History   Tobacco Use  Smoking Status Never Smoker  Smokeless Tobacco Never Used    Goals Met:  Independence with exercise equipment Exercise tolerated well No report of cardiac concerns or symptoms Strength training completed today  Goals Unmet:  Not Applicable  Comments: Pt able to follow exercise prescription today without complaint.  Will continue to monitor for progression.    Dr. Emily Filbert is Medical Director for [facility] and [facility].

## 2019-12-04 ENCOUNTER — Encounter: Payer: PPO | Admitting: *Deleted

## 2019-12-04 ENCOUNTER — Other Ambulatory Visit: Payer: Self-pay

## 2019-12-04 DIAGNOSIS — Z955 Presence of coronary angioplasty implant and graft: Secondary | ICD-10-CM

## 2019-12-04 NOTE — Progress Notes (Signed)
Daily Session Note  Patient Details  Name: DENIYA CRAIGO MRN: [id number] Date of Birth: 09-Mar-1946 Referring Provider:     Cardiac Rehab from 08/24/2019 in [facility] Cardiac and Pulmonary Rehab  Referring Provider  Josephina Gip MD      Encounter Date: 12/04/2019  Check In: Session Check In - 12/04/19 1019      Check-In   Supervising physician immediately available to respond to emergencies  See telemetry face sheet for immediately available ER MD    Location  [facility]-Cardiac & Pulmonary Rehab    Staff Present  Renita Papa, RN Moises Blood, BS, ACSM CEP, Exercise Physiologist;Joseph Tessie Fass RCP,RRT,BSRT    Virtual Visit  No    Medication changes reported      No    Fall or balance concerns reported     No    Warm-up and Cool-down  Performed on first and last piece of equipment    Resistance Training Performed  Yes    VAD Patient?  No    PAD/SET Patient?  No      Pain Assessment   Currently in Pain?  No/denies          Social History   Tobacco Use  Smoking Status Never Smoker  Smokeless Tobacco Never Used    Goals Met:  Independence with exercise equipment Exercise tolerated well No report of cardiac concerns or symptoms Strength training completed today  Goals Unmet:  Not Applicable  Comments: Pt able to follow exercise prescription today without complaint.  Will continue to monitor for progression.    Dr. Emily Filbert is Medical Director for [place] and [facility].

## 2019-12-07 ENCOUNTER — Ambulatory Visit: Payer: PPO

## 2019-12-11 ENCOUNTER — Encounter: Payer: PPO | Admitting: *Deleted

## 2019-12-11 ENCOUNTER — Other Ambulatory Visit: Payer: Self-pay

## 2019-12-11 DIAGNOSIS — Z955 Presence of coronary angioplasty implant and graft: Secondary | ICD-10-CM | POA: Diagnosis not present

## 2019-12-11 NOTE — Progress Notes (Signed)
Daily Session Note  Patient Details  Name: Kelsey Carter MRN: [id number] Date of Birth: 1946/03/21 Referring Provider:     Cardiac Rehab from 08/24/2019 in [facility] Cardiac and Pulmonary Rehab  Referring Provider  Josephina Gip MD      Encounter Date: 12/11/2019  Check In: Session Check In - 12/11/19 [place]      Check-In   Supervising physician immediately available to respond to emergencies  See telemetry face sheet for immediately available ER MD    Location  [facility]-Cardiac & Pulmonary Rehab    Staff Present  Renita Papa, RN BSN;Jessica [place], MA, RCEP, CCRP, [place], BS, ACSM CEP, Exercise Physiologist;Joseph Franklin RCP,RRT,BSRT    Virtual Visit  No    Medication changes reported      No    Fall or balance concerns reported     No    Warm-up and Cool-down  Performed on first and last piece of equipment    Resistance Training Performed  Yes    VAD Patient?  No    PAD/SET Patient?  No      Pain Assessment   Currently in Pain?  No/denies          Social History   Tobacco Use  Smoking Status Never Smoker  Smokeless Tobacco Never Used    Goals Met:  Independence with exercise equipment Exercise tolerated well No report of cardiac concerns or symptoms Strength training completed today  Goals Unmet:  Not Applicable  Comments: Pt able to follow exercise prescription today without complaint.  Will continue to monitor for progression.    Dr. Emily Filbert is Medical Director for [place] and [facility].

## 2019-12-18 ENCOUNTER — Ambulatory Visit: Payer: PPO

## 2019-12-18 ENCOUNTER — Other Ambulatory Visit: Payer: Self-pay

## 2019-12-18 ENCOUNTER — Encounter: Payer: PPO | Admitting: *Deleted

## 2019-12-18 DIAGNOSIS — Z955 Presence of coronary angioplasty implant and graft: Secondary | ICD-10-CM | POA: Diagnosis not present

## 2019-12-18 NOTE — Progress Notes (Signed)
Daily Session Note  Patient Details  Name: Kelsey Carter MRN: [id number] Date of Birth: [birth year] Referring Provider:     Cardiac Rehab from 08/24/2019 in [facility]  Referring Provider  Josephina Gip MD      Encounter Date: 12/18/2019  Check In: Session Check In - 12/18/19 1035      Check-In   Supervising physician immediately available to respond to emergencies  See telemetry face sheet for immediately available ER MD    Location  [facility]-Cardiac & Pulmonary Rehab    Staff Present  Heath Lark, RN, BSN, CCRP;Jeanna Durrell BS, Exercise Physiologist;Amanda Oletta Darter, BA, ACSM CEP, Exercise Physiologist    Virtual Visit  No    Medication changes reported      No    Fall or balance concerns reported     No    Warm-up and Cool-down  Performed on first and last piece of equipment    Resistance Training Performed  Yes    VAD Patient?  No    PAD/SET Patient?  No      Pain Assessment   Currently in Pain?  No/denies          Social History   Tobacco Use  Smoking Status Never Smoker  Smokeless Tobacco Never Used    Goals Met:  Independence with exercise equipment Exercise tolerated well No report of cardiac concerns or symptoms  Goals Unmet:  Not Applicable  Comments: Pt able to follow exercise prescription today without complaint.  Will continue to monitor for progression.    Dr. Emily Filbert is Medical Director for [facility].

## 2019-12-20 ENCOUNTER — Encounter: Payer: Self-pay | Admitting: *Deleted

## 2019-12-20 DIAGNOSIS — Z955 Presence of coronary angioplasty implant and graft: Secondary | ICD-10-CM

## 2019-12-20 NOTE — Progress Notes (Signed)
Cardiac Individual Treatment Plan  Patient Details  Name: Kelsey Carter MRN: [id number] Date of Birth: 09/17/1946 Referring Provider:     Cardiac Rehab from 08/24/2019 in [facility]  Referring Provider  Kowalski, Burce MD      Initial Encounter Date:    Cardiac Rehab from 08/24/2019 in [facility]  Date  08/24/19      Visit Diagnosis: Status post coronary artery stent placement  Patient's Home Medications on Admission:  Current Outpatient Medications:  .  alendronate (FOSAMAX) 70 MG tablet, Take 1 tablet (70 mg total) by mouth once a week. (Patient taking differently: Take 70 mg by mouth every Wednesday. ), Disp: 4 tablet, Rfl: 6 .  ALPRAZolam (XANAX) 1 MG tablet, TAKE 1 TABLET(1 MG) BY MOUTH AT BEDTIME AS NEEDED FOR ANXIETY (Patient taking differently: Take 1 mg by mouth at bedtime. ), Disp: 30 tablet, Rfl: 0 .  aspirin EC 81 MG tablet, Take 81 mg by mouth daily., Disp: , Rfl:  .  atorvastatin (LIPITOR) 40 MG tablet, Take 40 mg by mouth at bedtime., Disp: , Rfl:  .  Cholecalciferol (VITAMIN D) 2000 units CAPS, Take 1 capsule (2,000 Units total) by mouth daily. (Patient taking differently: Take 4,000 capsules by mouth daily. ), Disp: 30 capsule, Rfl:  .  clopidogrel (PLAVIX) 75 MG tablet, Take 1 tablet (75 mg total) by mouth daily., Disp: 90 tablet, Rfl: 2 .  MAGNESIUM PO, Take 1 tablet by mouth daily., Disp: , Rfl:  .  metoprolol succinate (TOPROL-XL) 50 MG 24 hr tablet, Take 50 mg by mouth daily. , Disp: , Rfl:  .  Omega-3 Fatty Acids (FISH OIL) 1000 MG CAPS, Take 1,000 mg by mouth daily., Disp: , Rfl:  .  telmisartan (MICARDIS) 80 MG tablet, Take 1 tablet (80 mg total) by mouth daily., Disp: 90 tablet, Rfl: 3 .  vitamin C (ASCORBIC ACID) 250 MG tablet, Take 500 mg by mouth daily., Disp: , Rfl:   Past Medical History: Past Medical History:  Diagnosis Date  . Anemia    vitamin d deficiency  . Anxiety   . Arthritis    back, DDD  .  Breast cancer (HCC)    bilateral mastectomies.  different types of cancer in each breast  . Cardiomyopathy due to chemotherapy (HCC) 2011  . Chronic pain 2019  . Complication of anesthesia   . Coronary artery disease   . GERD (gastroesophageal reflux disease)   . Hyperlipidemia   . Hypertension   . Lumbar herniated disc   . Peripheral vascular disease (HCC)    neuropathies in extremities due to chemotherapy  . Personal history of chemotherapy   . Personal history of radiation therapy   . PONV (postoperative nausea and vomiting)     Tobacco Use: Social History   Tobacco Use  Smoking Status Never Smoker  Smokeless Tobacco Never Used    Labs: Recent Review Flowsheet Data    Labs for ITP Cardiac and Pulmonary Rehab Latest Ref Rng & Units 04/24/2013 08/24/2017 03/22/2018 09/21/2018   Cholestrol 0 - 200 mg/dL 160 117 - 130   LDLCALC 0 - 99 mg/dL 87 48 - 59   HDL >40 mg/dL 54 53 - 49   Trlycerides <150 mg/dL 97 82 - 108   Hemoglobin A1c 4.8 - 5.6 % - - 5.9(H) 5.6       Exercise Target Goals: Exercise Program Goal: Individual exercise prescription set using results from initial 6 min walk test   and THRR while considering  patient's activity barriers and safety.   Exercise Prescription Goal: Initial exercise prescription builds to 30-45 minutes a day of aerobic activity, 2-3 days per week.  Home exercise guidelines will be given to patient during program as part of exercise prescription that the participant will acknowledge.  Activity Barriers & Risk Stratification: Activity Barriers & Cardiac Risk Stratification - 08/24/19 1034      Activity Barriers & Cardiac Risk Stratification   Activity Barriers  Other (comment);Balance Concerns;Back Problems;Muscular Weakness;Deconditioning;Shortness of Breath    Comments  neuropathy in hands and feet (post chemo), spinal stenosis    Cardiac Risk Stratification  Moderate       6 Minute Walk: 6 Minute Walk    Row Name 08/24/19 1033          6 Minute Walk   Phase  Initial     Distance  1170 feet     Walk Time  6 minutes     # of Rest Breaks  0     MPH  2.22     METS  2.43     RPE  1     Perceived Dyspnea   1     VO2 Peak  8.54     Symptoms  Yes (comment)     Comments  SOB     Resting HR  70 bpm     Resting BP  112/56     Resting Oxygen Saturation   97 %     Exercise Oxygen Saturation  during 6 min walk  95 %     Max Ex. HR  105 bpm     Max Ex. BP  134/70     2 Minute Post BP  130/64        Oxygen Initial Assessment:   Oxygen Re-Evaluation:   Oxygen Discharge (Final Oxygen Re-Evaluation):   Initial Exercise Prescription: Initial Exercise Prescription - 08/24/19 1000      Date of Initial Exercise RX and Referring Provider   Date  08/24/19    Referring Provider  Josephina Gip MD      Treadmill   MPH  1.9    Grade  0    Minutes  15    METs  2.45      Elliptical   Level  2    Speed  1    Minutes  15      REL-XR   Level  1    Speed  50    Minutes  15    METs  2      T5 Nustep   Level  1    SPM  80    Minutes  15    METs  2      Prescription Details   Frequency (times per week)  2    Duration  Progress to 30 minutes of continuous aerobic without signs/symptoms of physical distress      Intensity   THRR 40-80% of Max Heartrate  101-132    Ratings of Perceived Exertion  11-13    Perceived Dyspnea  0-4      Progression   Progression  Continue to progress workloads to maintain intensity without signs/symptoms of physical distress.      Resistance Training   Training Prescription  Yes    Weight  3lbs    Reps  10-15       Perform Capillary Blood Glucose checks as needed.  Exercise Prescription Changes: Exercise Prescription Changes  Row Name 08/24/19 1000 08/30/19 1100 10/11/19 1000 10/11/19 1300 10/25/19 1100     Response to Exercise   Blood Pressure (Admit)  112/56  124/84  -  118/60  110/64   Blood Pressure (Exercise)  134/70  146/72  -  138/72  122/84   Blood  Pressure (Exit)  130/64  122/64  -  124/70  96/60   Heart Rate (Admit)  70 bpm  63 bpm  -  69 bpm  60 bpm   Heart Rate (Exercise)  105 bpm  131 bpm  -  105 bpm  96 bpm   Heart Rate (Exit)  74 bpm  85 bpm  -  75 bpm  66 bpm   Oxygen Saturation (Admit)  97 %  -  -  -  -   Oxygen Saturation (Exercise)  95 %  -  -  -  -   Rating of Perceived Exertion (Exercise)  11  14  -  12  13   Perceived Dyspnea (Exercise)  1  -  -  -  -   Symptoms  SOB  -  -  -  none   Comments  walk test results  first day  -  -  -   Duration  -  -  -  Progress to 30 minutes of  aerobic without signs/symptoms of physical distress  Continue with 30 min of aerobic exercise without signs/symptoms of physical distress.   Intensity  -  -  -  THRR unchanged  THRR unchanged     Progression   Progression  -  -  -  -  Continue to progress workloads to maintain intensity without signs/symptoms of physical distress.   Average METs  -  -  -  -  2.42     Resistance Training   Training Prescription  -  -  -  Yes  Yes   Weight  -  -  -   3 lb  3 lbs   Reps  -  -  -  10-15  10-15     Interval Training   Interval Training  -  -  -  -  No     Treadmill   MPH  -  -  -  1.9  1.9   Grade  -  -  -  0  0   Minutes  -  -  -  15  15   METs  -  -  -  2.45  2.45     REL-XR   Level  -  -  -  1  1   Speed  -  -  -  50  -   Minutes  -  -  -  15  15   METs  -  -  -  3  3     T5 Nustep   Level  -  -  -  -  1   Minutes  -  -  -  -  15   METs  -  -  -  -  1.8     Home Exercise Plan   Plans to continue exercise at  -  -  Home (comment) walking staff videos  Home (comment) walking staff videos  Home (comment) walking staff videos   Frequency  -  -  Add 2 additional days to program exercise sessions.  Add 2 additional days to program exercise sessions.  Add 2  additional days to program exercise sessions.   Initial Home Exercises Provided  -  -  10/11/19  10/11/19  10/11/19   Row Name 11/09/19 1500 11/22/19 1400 12/08/19 1200          Response to Exercise   Blood Pressure (Admit)  122/60  122/80  116/60     Blood Pressure (Exercise)  136/70  122/70  120/60     Blood Pressure (Exit)  122/62  124/64  92/60     Heart Rate (Admit)  83 bpm  78 bpm  79 bpm     Heart Rate (Exercise)  90 bpm  95 bpm  98 bpm     Heart Rate (Exit)  76 bpm  66 bpm  75 bpm     Rating of Perceived Exertion (Exercise)  _0 Symptoms  none  none  none     Duration  Continue with 30 min of aerobic exercise without signs/symptoms of physical distress.  Continue with 30 min of aerobic exercise without signs/symptoms of physical distress.  Continue with 30 min of aerobic exercise without signs/symptoms of physical distress.     Intensity  THRR unchanged  THRR unchanged  THRR unchanged       Progression   Progression  Continue to progress workloads to maintain intensity without signs/symptoms of physical distress.  Continue to progress workloads to maintain intensity without signs/symptoms of physical distress.  Continue to progress workloads to maintain intensity without signs/symptoms of physical distress.     Average METs  2.85  2.25  2.32       Resistance Training   Training Prescription  Yes  Yes  Yes     Weight   3lb   3lb  3 lb     Reps  10-15  10-15  10-15       Interval Training   Interval Training  No  No  No       Arm Ergometer   Level  1  -  -     Minutes  15  -  -     METs  2.1  -  -       REL-XR   Level  _1 Speed  50  -  -     Minutes  _2 METs  3.9  2.8  3.1       T5 Nustep   Level  _3 Minutes  _4 METs  1.8  1.7  2       Biostep-RELP   Level  2  -  -     SPM  50  -  -     Minutes  15  -  -     METs  2  -  -       Home Exercise Plan   Plans to continue exercise at  Home (comment) walking staff videos  Home (comment) walking staff videos  Home (comment) walking staff videos     Frequency  Add 2 additional days to program exercise sessions.  Add 2 additional days to program  exercise sessions.  Add 2 additional days to program exercise sessions.     Initial Home Exercises Provided  10/11/19  10/11/19  10/11/19        Exercise  Comments: Exercise Comments    Row Name 08/30/19 1049 09/04/19 1056         Exercise Comments  First full day of exercise!  Patient was oriented to gym and equipment including functions, settings, policies, and procedures.  Patient's individual exercise prescription and treatment plan were reviewed.  All starting workloads were established based on the results of the 6 minute walk test done at initial orientation visit.  The plan for exercise progression was also introduced and progression will be customized based on patient's performance and goals.  Kelsey Carter did not complete her rehab session.   After placing Kelsey Carter on the monitor- noted fast heartrate and irregular rhythm.  Looking at previous strips she was in NSR.  Called MD office and she has appointment to see PA/NP at 230. No exercise, Sent home to go to MD appt today. Advised if she feels any symptoms SOB, Chest pain/tightness to call 911.         Exercise Goals and Review: Exercise Goals    Row Name 08/24/19 1039             Exercise Goals   Increase Physical Activity  Yes       Intervention  Provide advice, education, support and counseling about physical activity/exercise needs.;Develop an individualized exercise prescription for aerobic and resistive training based on initial evaluation findings, risk stratification, comorbidities and participant's personal goals.       Expected Outcomes  Short Term: Attend rehab on a regular basis to increase amount of physical activity.;Long Term: Add in home exercise to make exercise part of routine and to increase amount of physical activity.;Long Term: Exercising regularly at least 3-5 days a week.       Increase Strength and Stamina  Yes       Intervention  Provide advice, education, support and counseling about physical  activity/exercise needs.;Develop an individualized exercise prescription for aerobic and resistive training based on initial evaluation findings, risk stratification, comorbidities and participant's personal goals.       Expected Outcomes  Short Term: Increase workloads from initial exercise prescription for resistance, speed, and METs.;Short Term: Perform resistance training exercises routinely during rehab and add in resistance training at home;Long Term: Improve cardiorespiratory fitness, muscular endurance and strength as measured by increased METs and functional capacity (6MWT)       Able to understand and use rate of perceived exertion (RPE) scale  Yes       Intervention  Provide education and explanation on how to use RPE scale       Expected Outcomes  Short Term: Able to use RPE daily in rehab to express subjective intensity level;Long Term:  Able to use RPE to guide intensity level when exercising independently       Able to understand and use Dyspnea scale  Yes       Intervention  Provide education and explanation on how to use Dyspnea scale       Expected Outcomes  Short Term: Able to use Dyspnea scale daily in rehab to express subjective sense of shortness of breath during exertion;Long Term: Able to use Dyspnea scale to guide intensity level when exercising independently       Knowledge and understanding of Target Heart Rate Range (THRR)  Yes       Intervention  Provide education and explanation of THRR including how the numbers were predicted and where they are located for reference       Expected Outcomes  Short  Term: Able to state/look up THRR;Short Term: Able to use daily as guideline for intensity in rehab;Long Term: Able to use THRR to govern intensity when exercising independently       Able to check pulse independently  Yes       Intervention  Provide education and demonstration on how to check pulse in carotid and radial arteries.;Review the importance of being able to check your  own pulse for safety during independent exercise       Expected Outcomes  Short Term: Able to explain why pulse checking is important during independent exercise;Long Term: Able to check pulse independently and accurately       Understanding of Exercise Prescription  Yes       Intervention  Provide education, explanation, and written materials on patient's individual exercise prescription       Expected Outcomes  Short Term: Able to explain program exercise prescription;Long Term: Able to explain home exercise prescription to exercise independently          Exercise Goals Re-Evaluation : Exercise Goals Re-Evaluation    Row Name 08/30/19 1049 10/03/19 1001 10/11/19 1055 10/25/19 1151 11/09/19 1541     Exercise Goal Re-Evaluation   Exercise Goals Review  Increase Strength and Stamina;Increase Physical Activity;Able to understand and use rate of perceived exertion (RPE) scale;Knowledge and understanding of Target Heart Rate Range (THRR);Understanding of Exercise Prescription  -  Increase Strength and Stamina;Increase Physical Activity;Able to understand and use rate of perceived exertion (RPE) scale;Knowledge and understanding of Target Heart Rate Range (THRR);Understanding of Exercise Prescription;Able to check pulse independently  Increase Physical Activity;Increase Strength and Stamina;Understanding of Exercise Prescription  Increase Physical Activity;Increase Strength and Stamina;Able to understand and use rate of perceived exertion (RPE) scale;Knowledge and understanding of Target Heart Rate Range (THRR);Able to check pulse independently;Understanding of Exercise Prescription   Comments  Reviewed RPE scale, THR and program prescription with pt today.  Pt voiced understanding and was given a copy of goals to take home.  out since last review  Reviewed home exercise with pt today.  Pt plans to walking at home and staff videos for exercise.  Reviewed THR, pulse, RPE, sign and symptoms, and when to call  911 or MD.  Also discussed weather considerations and indoor options.  Pt voiced understanding.  Kelsey Carter has returned to exericse.  She only had three visits during the month of October.  She was encouraged to attend regularly again.  We will continue to monitor her progress.  Kelsey Carter has been attending more regularly in November.  She is working in RPE 11-15.   Expected Outcomes  Short: Use RPE daily to regulate intensity. Long: Follow program prescription in THR.  -  Short: Start to walk at home.  Long: Exercise independently  Short: Attend rehab regularly.  Long: Continue to rebuild stamina.  Short - continue to attend consistently Long - improve overall MET level   Row Name 11/20/19 1026 12/04/19 1031 12/08/19 1204         Exercise Goal Re-Evaluation   Exercise Goals Review  Increase Physical Activity;Increase Strength and Stamina;Able to understand and use rate of perceived exertion (RPE) scale;Knowledge and understanding of Target Heart Rate Range (THRR);Able to check pulse independently;Understanding of Exercise Prescription  Increase Physical Activity;Increase Strength and Stamina;Able to understand and use rate of perceived exertion (RPE) scale;Knowledge and understanding of Target Heart Rate Range (THRR);Able to check pulse independently;Understanding of Exercise Prescription  Increase Physical Activity;Increase Strength and Stamina;Able to understand and use rate  of perceived exertion (RPE) scale;Able to understand and use Dyspnea scale;Knowledge and understanding of Target Heart Rate Range (THRR);Able to check pulse independently;Understanding of Exercise Prescription     Comments  Kelsey Carter reports that she is using a peddling machine on off days of rehab. She reported that in rehab some of her workloads are starting to seem easier. Guidelines on properly increasing workloads was given.  Kelsey Carter is doing well in rehab.  She is now up to level 6 on the XR and is doing her home exercise.  She usually gets her one  extra day at home.  And she well also walk when she shops.  She is planning to join the [facility] [employer] [employer] when she graduates.  Overall she is feeling better.  -     Expected Outcomes  Short: Increase workload on XR machine to level 3 next week. Long: Continue to gain strengh and stamina and increase MET levels.  Short: Continue to improve workloads. Long: Continue to exercise independently  -        Discharge Exercise Prescription (Final Exercise Prescription Changes): Exercise Prescription Changes - 12/08/19 1200      Response to Exercise   Blood Pressure (Admit)  116/60    Blood Pressure (Exercise)  120/60    Blood Pressure (Exit)  92/60    Heart Rate (Admit)  79 bpm    Heart Rate (Exercise)  98 bpm    Heart Rate (Exit)  75 bpm    Rating of Perceived Exertion (Exercise)  13    Symptoms  none    Duration  Continue with 30 min of aerobic exercise without signs/symptoms of physical distress.    Intensity  THRR unchanged      Progression   Progression  Continue to progress workloads to maintain intensity without signs/symptoms of physical distress.    Average METs  2.32      Resistance Training   Training Prescription  Yes    Weight  3 lb    Reps  10-15      Interval Training   Interval Training  No      REL-XR   Level  6    Minutes  15    METs  3.1      T5 Nustep   Level  6    Minutes  15    METs  2      Home Exercise Plan   Plans to continue exercise at  Home (comment)   walking staff videos   Frequency  Add 2 additional days to program exercise sessions.    Initial Home Exercises Provided  10/11/19       Nutrition:  Target Goals: Understanding of nutrition guidelines, daily intake of sodium <1559m, cholesterol <2063m calories 30% from fat and 7% or less from saturated fats, daily to have 5 or more servings of fruits and vegetables.  Biometrics: Pre Biometrics - 08/24/19 1040      Pre Biometrics   Height  5' 4.1" (1.628 m)    Weight  170 lb (77.1 kg)    BMI  (Calculated)  29.09    Single Leg Stand  1.56 seconds        Nutrition Therapy Plan and Nutrition Goals: Nutrition Therapy & Goals - 08/24/19 1034      Nutrition Therapy   Diet  Low Na, HH eating    Protein (specify units)  65g    Fiber  25 grams    Whole Grain Foods  3 servings  Saturated Fats  12 max. grams    Fruits and Vegetables  5 servings/day    Sodium  1.5 grams      Personal Nutrition Goals   Nutrition Goal  ST: add more protein, make sure eating enough kcal LT: get more energy and strength    Comments  Pt reports eating raisin bread toast with some butter (WG) or instant oatmeal (pt reports the lower sugar one) or yogurt (pt reports 60kcal fruit one). Dinner is usually chicken (breast or thigh and skin) with vegetables (occasionally regular pasta , but small amount). Discussed protein and kcal needs and that she doesn't seem to be getting enough especially not enough to support exercise and muscle building. Talked about maybe a snack before and after exercise (pt wants to do a kind bar before and greek yogurt after). Talked about healthy snacking and how to balance snacks (fat and prtoein especially). Pt reports will sometimes have a yogurt in the middle of the day and will add jello or pudding cup if she feels like she isn't getting enough. Discussed HH eating and weight loss vs healthy outcomes. Discussed that we will review energy and strength levels during program to ensure she is getting enough.      Intervention Plan   Intervention  Prescribe, educate and counsel regarding individualized specific dietary modifications aiming towards targeted core components such as weight, hypertension, lipid management, diabetes, heart failure and other comorbidities.;Nutrition handout(s) given to patient.    Expected Outcomes  Short Term Goal: Understand basic principles of dietary content, such as calories, fat, sodium, cholesterol and nutrients.;Short Term Goal: A plan has been developed  with personal nutrition goals set during dietitian appointment.;Long Term Goal: Adherence to prescribed nutrition plan.       Nutrition Assessments: Nutrition Assessments - 08/24/19 1314      MEDFICTS Scores   Pre Score  15       Nutrition Goals Re-Evaluation: Nutrition Goals Re-Evaluation    Row Name 10/09/19 1050 10/30/19 1016 12/12/19 0828         Goals   Current Weight  168 lb 4.8 oz (76.3 kg)  164 lb (74.4 kg)  -     Nutrition Goal  ST: add more protein, make sure eating enough kcal LT: get more energy and strength  ST: add more protein, make sure eating enough kcal LT: get more energy and strength, get off medicince  ST: add more protein, make sure eating enough kcal LT: get more energy and strength, get off medicince     Comment  Pt reports doing well, strength and energy is good, but pt reports having some heart problems she is working with her doctor to resolve. Pt reports adding a protein bar in between meals. Pt reports only hungry enough for two meals, discussed if energy and/or strength begins to deminish or not continue to imporve or if her weight falls too rapidly that we would have to implement high kcal and protein MNT as well as mechanical eating.  Pt reports now 162lbs from her scale - goal is 155lbs. Energy is ok, pt is doing more around the house. Pt still continuing to add protein with protein bars. pt reports strength is better than when we last spoke.  Continue current changes     Expected Outcome  ST: add more protein, make sure eating enough kcal LT: get more energy and strength  ST: add more protein, make sure eating enough kcal LT: get more energy and strength  ST: add more protein, make sure eating enough kcal LT: get more energy and strength        Nutrition Goals Discharge (Final Nutrition Goals Re-Evaluation): Nutrition Goals Re-Evaluation - 12/12/19 8101      Goals   Nutrition Goal  ST: add more protein, make sure eating enough kcal LT: get more energy  and strength, get off medicince    Comment  Continue current changes    Expected Outcome  ST: add more protein, make sure eating enough kcal LT: get more energy and strength       Psychosocial: Target Goals: Acknowledge presence or absence of significant depression and/or stress, maximize coping skills, provide positive support system. Participant is able to verbalize types and ability to use techniques and skills needed for reducing stress and depression.   Initial Review & Psychosocial Screening: Initial Psych Review & Screening - 08/23/19 1134      Initial Review   Current issues with  Current Sleep Concerns   sleepy throughout the day     Family Dynamics   Good Support System?  Yes      Barriers   Psychosocial barriers to participate in program  There are no identifiable barriers or psychosocial needs.;The patient should benefit from training in stress management and relaxation.      Screening Interventions   Interventions  Encouraged to exercise;To provide support and resources with identified psychosocial needs;Provide feedback about the scores to participant    Expected Outcomes  Short Term goal: Utilizing psychosocial counselor, staff and physician to assist with identification of specific Stressors or current issues interfering with healing process. Setting desired goal for each stressor or current issue identified.;Long Term Goal: Stressors or current issues are controlled or eliminated.;Short Term goal: Identification and review with participant of any Quality of Life or Depression concerns found by scoring the questionnaire.;Long Term goal: The participant improves quality of Life and PHQ9 Scores as seen by post scores and/or verbalization of changes       Quality of Life Scores:  Quality of Life - 08/24/19 1058      Quality of Life   Select  Quality of Life      Quality of Life Scores   Health/Function Pre  22.96 %    Socioeconomic Pre  27.14 %    Psych/Spiritual Pre   27.86 %    Family Pre  26.4 %    GLOBAL Pre  25.41 %      Scores of 19 and below usually indicate a poorer quality of life in these areas.  A difference of  2-3 points is a clinically meaningful difference.  A difference of 2-3 points in the total score of the Quality of Life Index has been associated with significant improvement in overall quality of life, self-image, physical symptoms, and general health in studies assessing change in quality of life.  PHQ-9: Recent Review Flowsheet Data    Depression screen [facility] 2/9 08/24/2019 09/21/2018 03/31/2018 03/03/2018 03/02/2018   Decreased Interest 0 0 0 0 0   Down, Depressed, Hopeless 0 0 0 0 0   PHQ - 2 Score 0 0 0 0 0   Altered sleeping 0 0 0 - -   Tired, decreased energy 1 0 0 - -   Change in appetite 0 0 0 - -   Feeling bad or failure about yourself  0 0 0 - -   Trouble concentrating 0 0 0 - -   Moving slowly or fidgety/restless 0 0  0 - -   Suicidal thoughts 0 0 0 - -   PHQ-9 Score 1 0 0 - -   Difficult doing work/chores Not difficult at all - Not difficult at all - -     Interpretation of Total Score  Total Score Depression Severity:  1-4 = Minimal depression, 5-9 = Mild depression, 10-14 = Moderate depression, 15-19 = Moderately severe depression, 20-27 = Severe depression   Psychosocial Evaluation and Intervention:   Psychosocial Re-Evaluation: Psychosocial Re-Evaluation    Simms Name 10/11/19 1034 11/20/19 1028 12/04/19 1034         Psychosocial Re-Evaluation   Current issues with  Current Sleep Concerns  None Identified  None Identified     Comments  Kelsey Carter says that she has changed primary doctors because she has neuropathy and her old doctor cut her medicine in half. She has to see her doctor yet to get her prescriptions in order. Her sleep has been ok but could be better. She takes a lot of medicine and has to take thinners and she is coming off of some of them when her doctor tells her to.  Kelsey Carter reports no new stress concerns,   sleeping well, and having a strong support system. She is still working with her doctors on the amount of blood thinners she is taking and may come off of one at her next appointment.  Kelsey Carter is doing well in rehab.  She is pleased with her progress but still wants to come off her blood thinners.  She is bored at home, but managing.  She continues to sleep well.     Expected Outcomes  Short: attend class to reduce stress and improve sleep. Long: maintain medications to help sleep.  Short: attend class to reduce stress. Long: maintain good mental health with healthy habbits.  Short: Continue to work towards graduation.  Long: Continue to stay positive.     Interventions  Encouraged to attend Cardiac Rehabilitation for the exercise  -  Encouraged to attend Cardiac Rehabilitation for the exercise     Continue Psychosocial Services   Follow up required by staff  Follow up required by staff  -        Psychosocial Discharge (Final Psychosocial Re-Evaluation): Psychosocial Re-Evaluation - 12/04/19 1034      Psychosocial Re-Evaluation   Current issues with  None Identified    Comments  Kelsey Carter is doing well in rehab.  She is pleased with her progress but still wants to come off her blood thinners.  She is bored at home, but managing.  She continues to sleep well.    Expected Outcomes  Short: Continue to work towards graduation.  Long: Continue to stay positive.    Interventions  Encouraged to attend Cardiac Rehabilitation for the exercise       Vocational Rehabilitation: Provide vocational rehab assistance to qualifying candidates.   Vocational Rehab Evaluation & Intervention: Vocational Rehab - 08/23/19 1136      Initial Vocational Rehab Evaluation & Intervention   Assessment shows need for Vocational Rehabilitation  No       Education: Education Goals: Education classes will be provided on a variety of topics geared toward better understanding of heart health and risk factor modification.  Participant will state understanding/return demonstration of topics presented as noted by education test scores.  Learning Barriers/Preferences: Learning Barriers/Preferences - 08/23/19 1136      Learning Barriers/Preferences   Learning Barriers  None    Learning Preferences  None  Education Topics:  AED/CPR: - Group verbal and written instruction with the use of models to demonstrate the basic use of the AED with the basic ABC's of resuscitation.   General Nutrition Guidelines/Fats and Fiber: -Group instruction provided by verbal, written material, models and posters to present the general guidelines for heart healthy nutrition. Gives an explanation and review of dietary fats and fiber.   Controlling Sodium/Reading Food Labels: -Group verbal and written material supporting the discussion of sodium use in heart healthy nutrition. Review and explanation with models, verbal and written materials for utilization of the food label.   Exercise Physiology & General Exercise Guidelines: - Group verbal and written instruction with models to review the exercise physiology of the cardiovascular system and associated critical values. Provides general exercise guidelines with specific guidelines to those with heart or lung disease.    Aerobic Exercise & Resistance Training: - Gives group verbal and written instruction on the various components of exercise. Focuses on aerobic and resistive training programs and the benefits of this training and how to safely progress through these programs..   Cardiac Rehab from 10/11/2019 in [facility] Cardiac and Pulmonary Rehab  Date  10/11/19  Educator  AS  Instruction Review Code  1- Verbalizes Understanding      Flexibility, Balance, Mind/Body Relaxation: Provides group verbal/written instruction on the benefits of flexibility and balance training, including mind/body exercise modes such as yoga, pilates and tai chi.  Demonstration and skill practice  provided.   Stress and Anxiety: - Provides group verbal and written instruction about the health risks of elevated stress and causes of high stress.  Discuss the correlation between heart/lung disease and anxiety and treatment options. Review healthy ways to manage with stress and anxiety.   Depression: - Provides group verbal and written instruction on the correlation between heart/lung disease and depressed mood, treatment options, and the stigmas associated with seeking treatment.   Anatomy & Physiology of the Heart: - Group verbal and written instruction and models provide basic cardiac anatomy and physiology, with the coronary electrical and arterial systems. Review of Valvular disease and Heart Failure   Cardiac Procedures: - Group verbal and written instruction to review commonly prescribed medications for heart disease. Reviews the medication, class of the drug, and side effects. Includes the steps to properly store meds and maintain the prescription regimen. (beta blockers and nitrates)   Cardiac Medications I: - Group verbal and written instruction to review commonly prescribed medications for heart disease. Reviews the medication, class of the drug, and side effects. Includes the steps to properly store meds and maintain the prescription regimen.   Cardiac Medications II: -Group verbal and written instruction to review commonly prescribed medications for heart disease. Reviews the medication, class of the drug, and side effects. (all other drug classes)   Cardiac Rehab from 10/11/2019 in [facility] Cardiac and Pulmonary Rehab  Date  10/11/19  Educator  SB  Instruction Review Code  1- Verbalizes Understanding       Go Sex-Intimacy & Heart Disease, Get SMART - Goal Setting: - Group verbal and written instruction through game format to discuss heart disease and the return to sexual intimacy. Provides group verbal and written material to discuss and apply goal setting through the  application of the S.M.A.R.T. Method.   Other Matters of the Heart: - Provides group verbal, written materials and models to describe Stable Angina and Peripheral Artery. Includes description of the disease process and treatment options available to the cardiac patient.  Exercise & Equipment Safety: - Individual verbal instruction and demonstration of equipment use and safety with use of the equipment.   Cardiac Rehab from 10/11/2019 in [facility] Cardiac and Pulmonary Rehab  Date  08/24/19  Educator  [facility]  Instruction Review Code  1- Verbalizes Understanding      Infection Prevention: - Provides verbal and written material to individual with discussion of infection control including proper hand washing and proper equipment cleaning during exercise session.   Cardiac Rehab from 10/11/2019 in [facility] Cardiac and Pulmonary Rehab  Date  08/24/19  Educator  [facility]  Instruction Review Code  1- Verbalizes Understanding      Falls Prevention: - Provides verbal and written material to individual with discussion of falls prevention and safety.   Cardiac Rehab from 10/11/2019 in [facility] Cardiac and Pulmonary Rehab  Date  08/24/19  Educator  [facility]  Instruction Review Code  1- Verbalizes Understanding      Diabetes: - Individual verbal and written instruction to review signs/symptoms of diabetes, desired ranges of glucose level fasting, after meals and with exercise. Acknowledge that pre and post exercise glucose checks will be done for 3 sessions at entry of program.   Know Your Numbers and Risk Factors: -Group verbal and written instruction about important numbers in your health.  Discussion of what are risk factors and how they play a role in the disease process.  Review of Cholesterol, Blood Pressure, Diabetes, and BMI and the role they play in your overall health.   Cardiac Rehab from 10/11/2019 in [facility] Cardiac and Pulmonary Rehab  Date  10/11/19  Educator  SB  Instruction Review Code  1- Verbalizes  Understanding      Sleep Hygiene: -Provides group verbal and written instruction about how sleep can affect your health.  Define sleep hygiene, discuss sleep cycles and impact of sleep habits. Review good sleep hygiene tips.    Other: -Provides group and verbal instruction on various topics (see comments)   Knowledge Questionnaire Score: Knowledge Questionnaire Score - 08/24/19 1057      Knowledge Questionnaire Score   Pre Score  19/26 Education Focus: Angina, Sex, HF, Nutrition, Exercise       Core Components/Risk Factors/Patient Goals at Admission: Personal Goals and Risk Factors at Admission - 08/24/19 1041      Core Components/Risk Factors/Patient Goals on Admission    Weight Management  Yes;Weight Loss    Intervention  Weight Management: Develop a combined nutrition and exercise program designed to reach desired caloric intake, while maintaining appropriate intake of nutrient and fiber, sodium and fats, and appropriate energy expenditure required for the weight goal.;Weight Management: Provide education and appropriate resources to help participant work on and attain dietary goals.;Weight Management/Obesity: Establish reasonable short term and long term weight goals.    Admit Weight  170 lb (77.1 kg)    Goal Weight: Short Term  165 lb (74.8 kg)    Goal Weight: Long Term  160 lb (72.6 kg)    Expected Outcomes  Short Term: Continue to assess and modify interventions until short term weight is achieved;Long Term: Adherence to nutrition and physical activity/exercise program aimed toward attainment of established weight goal;Weight Loss: Understanding of general recommendations for a balanced deficit meal plan, which promotes 1-2 lb weight loss per week and includes a negative energy balance of [phone] kcal/d;Understanding recommendations for meals to include 15-35% energy as protein, 25-35% energy from fat, 35-60% energy from carbohydrates, less than 252m of dietary cholesterol,  20-35 gm of total  fiber daily;Understanding of distribution of calorie intake throughout the day with the consumption of 4-5 meals/snacks    Improve shortness of breath with ADL's  Yes    Intervention  Provide education, individualized exercise plan and daily activity instruction to help decrease symptoms of SOB with activities of daily living.    Expected Outcomes  Short Term: Improve cardiorespiratory fitness to achieve a reduction of symptoms when performing ADLs;Long Term: Be able to perform more ADLs without symptoms or delay the onset of symptoms    Hypertension  Yes    Intervention  Provide education on lifestyle modifcations including regular physical activity/exercise, weight management, moderate sodium restriction and increased consumption of fresh fruit, vegetables, and low fat dairy, alcohol moderation, and smoking cessation.;Monitor prescription use compliance.    Expected Outcomes  Short Term: Continued assessment and intervention until BP is < 140/44m HG in hypertensive participants. < 130/859mHG in hypertensive participants with diabetes, heart failure or chronic kidney disease.;Long Term: Maintenance of blood pressure at goal levels.    Lipids  Yes    Intervention  Provide education and support for participant on nutrition & aerobic/resistive exercise along with prescribed medications to achieve LDL <708mHDL >66m68m  Expected Outcomes  Short Term: Participant states understanding of desired cholesterol values and is compliant with medications prescribed. Participant is following exercise prescription and nutrition guidelines.;Long Term: Cholesterol controlled with medications as prescribed, with individualized exercise RX and with personalized nutrition plan. Value goals: LDL < 70mg36mL > 40 mg.       Core Components/Risk Factors/Patient Goals Review:  Goals and Risk Factor Review    Row Name 10/11/19 1037 11/20/19 1040 12/04/19 1032         Core Components/Risk  Factors/Patient Goals Review   Personal Goals Review  Weight Management/Obesity;Improve shortness of breath with ADL's;Lipids;Hypertension  Weight Management/Obesity;Improve shortness of breath with ADL's;Lipids;Hypertension  Weight Management/Obesity;Improve shortness of breath with ADL's;Lipids;Hypertension     Review  Sue wCollie Siads to lose weight and so far she has lost about three pounds. She is taking her blood pressure medications everyday and her statin. She wants to achieve a weight of 155 pounds. She is eating healthier and is ready to be lighter. Her shortness of breath is ok when she is cleaning and she watches when she gets short of breath.  Sue rCollie Siadrts that she is still losing weight and has met the goal of losing 5 lbs in 2 weeks. She plans to continue her exercise and eating habits to eventually reach goal weight of 155 lbs. Sue rCollie Siadrts her shortness of breath has impoved and she only gets short of breath when walking up a hill, but otherwise can accomplish most of her daily routine. Sue rCollie Siadrts taking all meds to control hypertension and lipies.  Sue cCollie Siadinues to do well in rehab.  Her weight continues to go down and this makes her happy.  Her blood pressures continue to do well.  Her SOB has greatly improved andshe feels better with all that she is doing.     Expected Outcomes  Short: lose 5 pounds in two weeks. Long: Weigh 155 pounds.  Short: continue to lose 1-2 pounds a week. Long: weigh 155 lbs.  Short: Continue to work on weight loss.  Long: Continue to monitor risk factors.        Core Components/Risk Factors/Patient Goals at Discharge (Final Review):  Goals and Risk Factor Review - 12/04/19 1032      Core Components/Risk Factors/Patient Goals Review  Personal Goals Review  Weight Management/Obesity;Improve shortness of breath with ADL's;Lipids;Hypertension    Review  Kelsey Carter continues to do well in rehab.  Her weight continues to go down and this makes her happy.  Her blood pressures  continue to do well.  Her SOB has greatly improved andshe feels better with all that she is doing.    Expected Outcomes  Short: Continue to work on weight loss.  Long: Continue to monitor risk factors.       ITP Comments: ITP Comments    Row Name 08/23/19 1138 08/24/19 1032 08/30/19 0546 09/04/19 1056 09/12/19 1109   ITP Comments  Virtual initial orientation completed. Diagnosis can be found in [facility] 8/12. EP/RD Orientation scheduled 9/3 at 9:30  Completed 6MWT, gym orientation, and RD evaluation. Initial ITP created and sent for review to Dr. Emily Filbert, Medical Director.  30 Day review. Continue with ITP unless directed changes per Medical Director review.  New to program attended orientation  Kelsey Carter did not complete her rehab session.   After placing Kelsey Carter on the monitor- noted fast heartrate and irregular rhythm.  Looking at previous strips she was in NSR.  Called MD office and she has appointment to see PA/NP at 230. No exercise, Sent home to go to MD appt today. Advised if she feels any symptoms SOB, Chest pain/tightness to call 911.  Called check up on pt since we sent her to doctor for afib.  She has started some new medications.  She has a follow up appt on Friday and will ask about returning to rehab then.   Row Name 09/27/19 1117 10/03/19 1000 10/25/19 0613 11/22/19 0926 12/20/19 0830   ITP Comments  30 day review completed. ITP sent to Dr. Emily Filbert, Medical Director of Cardiac and Pulmonary Rehab. Continue with ITP unless changes are made by physician.  Department closed starting 10/2 until further notice by infection prevention and Health at Work teams for [place].  Called to check on pt.  She has been out since 9/9.  She was cleared to return to rehab on 10/5 and will be back next Monday.  30 day review completed. Continue with ITP sent to Dr. Emily Filbert, Medical Director of Cardiac and Pulmonary Rehab for review , changes as needed and signature.  30 day review competed . ITP  sent to Dr Emily Filbert for review, changes as needed and ITP approval signature.  30 day review competed . ITP sent to Dr Emily Filbert for review, changes as needed and ITP approval signature      Comments:

## 2019-12-25 ENCOUNTER — Other Ambulatory Visit: Payer: Self-pay

## 2019-12-25 ENCOUNTER — Encounter: Payer: PPO | Attending: Internal Medicine | Admitting: *Deleted

## 2019-12-25 DIAGNOSIS — Z955 Presence of coronary angioplasty implant and graft: Secondary | ICD-10-CM | POA: Insufficient documentation

## 2019-12-25 NOTE — Progress Notes (Signed)
Virtual Visit call F/U done today. No rehab classes scheduled this week.

## 2019-12-26 DIAGNOSIS — I2510 Atherosclerotic heart disease of native coronary artery without angina pectoris: Secondary | ICD-10-CM | POA: Diagnosis not present

## 2019-12-26 DIAGNOSIS — E782 Mixed hyperlipidemia: Secondary | ICD-10-CM | POA: Diagnosis not present

## 2019-12-26 DIAGNOSIS — I480 Paroxysmal atrial fibrillation: Secondary | ICD-10-CM | POA: Diagnosis not present

## 2019-12-26 DIAGNOSIS — R9431 Abnormal electrocardiogram [ECG] [EKG]: Secondary | ICD-10-CM | POA: Diagnosis not present

## 2019-12-26 DIAGNOSIS — I10 Essential (primary) hypertension: Secondary | ICD-10-CM | POA: Diagnosis not present

## 2020-01-01 ENCOUNTER — Encounter: Payer: PPO | Admitting: *Deleted

## 2020-01-01 ENCOUNTER — Other Ambulatory Visit: Payer: Self-pay

## 2020-01-01 DIAGNOSIS — Z955 Presence of coronary angioplasty implant and graft: Secondary | ICD-10-CM

## 2020-01-01 NOTE — Progress Notes (Signed)
Daily Session Note  Patient Details  Name: Kelsey Carter MRN: [id number] Date of Birth: [birth year] Referring Provider:     Cardiac Rehab from 08/24/2019 in [facility] Cardiac and Pulmonary Rehab  Referring Provider  Josephina Gip MD      Encounter Date: 01/01/2020  Check In: Session Check In - 01/01/20 0911      Check-In   Supervising physician immediately available to respond to emergencies  See telemetry face sheet for immediately available ER MD    Location  [facility]-Cardiac & Pulmonary Rehab    Staff Present  Heath Lark, RN, BSN, CCRP;Jessica [place], MA, RCEP, CCRP, [place], BS, ACSM CEP, Exercise Physiologist;Joseph Lorenzo RCP,RRT,BSRT    Virtual Visit  No    Medication changes reported      No    Fall or balance concerns reported     No    Warm-up and Cool-down  Performed on first and last piece of equipment    Resistance Training Performed  Yes    VAD Patient?  No    PAD/SET Patient?  No      Pain Assessment   Currently in Pain?  No/denies          Social History   Tobacco Use  Smoking Status Never Smoker  Smokeless Tobacco Never Used    Goals Met:  Independence with exercise equipment Exercise tolerated well No report of cardiac concerns or symptoms  Goals Unmet:  Not Applicable  Comments: Pt able to follow exercise prescription today without complaint.  Will continue to monitor for progression.    Dr. Emily Filbert is Medical Director for [place] and [facility].

## 2020-01-08 ENCOUNTER — Encounter: Payer: PPO | Admitting: *Deleted

## 2020-01-08 ENCOUNTER — Other Ambulatory Visit: Payer: Self-pay

## 2020-01-08 DIAGNOSIS — Z955 Presence of coronary angioplasty implant and graft: Secondary | ICD-10-CM

## 2020-01-08 NOTE — Progress Notes (Signed)
Talked to Kelsey Carter to check in as this is her off week from inpatient class. She is doing well, trying to stay active given the cold weather. She is continuing to take her medication as prescribed and reports her BP feeling fine. She is planning on coming to class this coming Monday.

## 2020-01-15 ENCOUNTER — Encounter: Payer: PPO | Admitting: *Deleted

## 2020-01-15 ENCOUNTER — Other Ambulatory Visit: Payer: Self-pay

## 2020-01-15 DIAGNOSIS — Z955 Presence of coronary angioplasty implant and graft: Secondary | ICD-10-CM

## 2020-01-15 NOTE — Progress Notes (Signed)
Daily Session Note  Patient Details  Name: Kelsey Carter MRN: [id number] Date of Birth: 03-01-1946 Referring Provider:     Cardiac Rehab from 08/24/2019 in [facility] Cardiac and Pulmonary Rehab  Referring Provider  Josephina Gip MD      Encounter Date: 01/15/2020  Check In: Session Check In - 01/15/20 1532      Check-In   Supervising physician immediately available to respond to emergencies  See telemetry face sheet for immediately available ER MD    Location  [facility]-Cardiac & Pulmonary Rehab    Staff Present  Earlean Shawl, BS, ACSM CEP, Exercise Physiologist;Johncarlos [place] Sherryll Burger, RN BSN;Joseph Foy [place], [place], ACSM CEP, Exercise Physiologist    Virtual Visit  No    Medication changes reported      No    Fall or balance concerns reported     No    Warm-up and Cool-down  Performed on first and last piece of equipment    Resistance Training Performed  Yes    VAD Patient?  No    PAD/SET Patient?  No      Pain Assessment   Currently in Pain?  No/denies          Social History   Tobacco Use  Smoking Status Never Smoker  Smokeless Tobacco Never Used    Goals Met:  Independence with exercise equipment Exercise tolerated well No report of cardiac concerns or symptoms Strength training completed today  Goals Unmet:  Not Applicable  Comments: Pt able to follow exercise prescription today without complaint.  Will continue to monitor for progression.    Dr. Emily Filbert is Medical Director for [place] and [facility].

## 2020-01-16 DIAGNOSIS — R9431 Abnormal electrocardiogram [ECG] [EKG]: Secondary | ICD-10-CM | POA: Diagnosis not present

## 2020-01-16 DIAGNOSIS — I2510 Atherosclerotic heart disease of native coronary artery without angina pectoris: Secondary | ICD-10-CM | POA: Diagnosis not present

## 2020-01-16 DIAGNOSIS — I480 Paroxysmal atrial fibrillation: Secondary | ICD-10-CM | POA: Diagnosis not present

## 2020-01-16 DIAGNOSIS — E782 Mixed hyperlipidemia: Secondary | ICD-10-CM | POA: Diagnosis not present

## 2020-01-16 DIAGNOSIS — I10 Essential (primary) hypertension: Secondary | ICD-10-CM | POA: Diagnosis not present

## 2020-01-17 ENCOUNTER — Encounter: Payer: Self-pay | Admitting: *Deleted

## 2020-01-17 DIAGNOSIS — Z955 Presence of coronary angioplasty implant and graft: Secondary | ICD-10-CM

## 2020-01-17 NOTE — Progress Notes (Signed)
Cardiac Individual Treatment Plan  Patient Details  Name: Kelsey Carter MRN: [id number] Date of Birth: 03/04/1946 Referring Provider:     Cardiac Rehab from 08/24/2019 in [facility]  Referring Provider  Kowalski, Burce MD      Initial Encounter Date:    Cardiac Rehab from 08/24/2019 in [facility]  Date  08/24/19      Visit Diagnosis: Status post coronary artery stent placement  Patient's Home Medications on Admission:  Current Outpatient Medications:  .  alendronate (FOSAMAX) 70 MG tablet, Take 1 tablet (70 mg total) by mouth once a week. (Patient taking differently: Take 70 mg by mouth every Wednesday. ), Disp: 4 tablet, Rfl: 6 .  ALPRAZolam (XANAX) 1 MG tablet, TAKE 1 TABLET(1 MG) BY MOUTH AT BEDTIME AS NEEDED FOR ANXIETY (Patient taking differently: Take 1 mg by mouth at bedtime. ), Disp: 30 tablet, Rfl: 0 .  aspirin EC 81 MG tablet, Take 81 mg by mouth daily., Disp: , Rfl:  .  atorvastatin (LIPITOR) 40 MG tablet, Take 40 mg by mouth at bedtime., Disp: , Rfl:  .  Cholecalciferol (VITAMIN D) 2000 units CAPS, Take 1 capsule (2,000 Units total) by mouth daily. (Patient taking differently: Take 4,000 capsules by mouth daily. ), Disp: 30 capsule, Rfl:  .  clopidogrel (PLAVIX) 75 MG tablet, Take 1 tablet (75 mg total) by mouth daily., Disp: 90 tablet, Rfl: 2 .  MAGNESIUM PO, Take 1 tablet by mouth daily., Disp: , Rfl:  .  metoprolol succinate (TOPROL-XL) 50 MG 24 hr tablet, Take 50 mg by mouth daily. , Disp: , Rfl:  .  Omega-3 Fatty Acids (FISH OIL) 1000 MG CAPS, Take 1,000 mg by mouth daily., Disp: , Rfl:  .  telmisartan (MICARDIS) 80 MG tablet, Take 1 tablet (80 mg total) by mouth daily., Disp: 90 tablet, Rfl: 3 .  vitamin C (ASCORBIC ACID) 250 MG tablet, Take 500 mg by mouth daily., Disp: , Rfl:   Past Medical History: Past Medical History:  Diagnosis Date  . Anemia    vitamin d deficiency  . Anxiety   . Arthritis    back, DDD  .  Breast cancer (HCC)    bilateral mastectomies.  different types of cancer in each breast  . Cardiomyopathy due to chemotherapy (HCC) 2011  . Chronic pain 2019  . Complication of anesthesia   . Coronary artery disease   . GERD (gastroesophageal reflux disease)   . Hyperlipidemia   . Hypertension   . Lumbar herniated disc   . Peripheral vascular disease (HCC)    neuropathies in extremities due to chemotherapy  . Personal history of chemotherapy   . Personal history of radiation therapy   . PONV (postoperative nausea and vomiting)     Tobacco Use: Social History   Tobacco Use  Smoking Status Never Smoker  Smokeless Tobacco Never Used    Labs: Recent Review Flowsheet Data    Labs for ITP Cardiac and Pulmonary Rehab Latest Ref Rng & Units 04/24/2013 08/24/2017 03/22/2018 09/21/2018   Cholestrol 0 - 200 mg/dL 160 117 - 130   LDLCALC 0 - 99 mg/dL 87 48 - 59   HDL >40 mg/dL 54 53 - 49   Trlycerides <150 mg/dL 97 82 - 108   Hemoglobin A1c 4.8 - 5.6 % - - 5.9(H) 5.6       Exercise Target Goals: Exercise Program Goal: Individual exercise prescription set using results from initial 6 min walk test   and THRR while considering  patient's activity barriers and safety.   Exercise Prescription Goal: Initial exercise prescription builds to 30-45 minutes a day of aerobic activity, 2-3 days per week.  Home exercise guidelines will be given to patient during program as part of exercise prescription that the participant will acknowledge.  Activity Barriers & Risk Stratification: Activity Barriers & Cardiac Risk Stratification - 08/24/19 1034      Activity Barriers & Cardiac Risk Stratification   Activity Barriers  Other (comment);Balance Concerns;Back Problems;Muscular Weakness;Deconditioning;Shortness of Breath    Comments  neuropathy in hands and feet (post chemo), spinal stenosis    Cardiac Risk Stratification  Moderate       6 Minute Walk: 6 Minute Walk    Row Name 08/24/19 1033          6 Minute Walk   Phase  Initial     Distance  1170 feet     Walk Time  6 minutes     # of Rest Breaks  0     MPH  2.22     METS  2.43     RPE  1     Perceived Dyspnea   1     VO2 Peak  8.54     Symptoms  Yes (comment)     Comments  SOB     Resting HR  70 bpm     Resting BP  112/56     Resting Oxygen Saturation   97 %     Exercise Oxygen Saturation  during 6 min walk  95 %     Max Ex. HR  105 bpm     Max Ex. BP  134/70     2 Minute Post BP  130/64        Oxygen Initial Assessment:   Oxygen Re-Evaluation:   Oxygen Discharge (Final Oxygen Re-Evaluation):   Initial Exercise Prescription: Initial Exercise Prescription - 08/24/19 1000      Date of Initial Exercise RX and Referring Provider   Date  08/24/19    Referring Provider  Josephina Gip MD      Treadmill   MPH  1.9    Grade  0    Minutes  15    METs  2.45      Elliptical   Level  2    Speed  1    Minutes  15      REL-XR   Level  1    Speed  50    Minutes  15    METs  2      T5 Nustep   Level  1    SPM  80    Minutes  15    METs  2      Prescription Details   Frequency (times per week)  2    Duration  Progress to 30 minutes of continuous aerobic without signs/symptoms of physical distress      Intensity   THRR 40-80% of Max Heartrate  101-132    Ratings of Perceived Exertion  11-13    Perceived Dyspnea  0-4      Progression   Progression  Continue to progress workloads to maintain intensity without signs/symptoms of physical distress.      Resistance Training   Training Prescription  Yes    Weight  3lbs    Reps  10-15       Perform Capillary Blood Glucose checks as needed.  Exercise Prescription Changes: Exercise Prescription Changes  [place] Name 08/24/19 1000 08/30/19 1100 10/11/19 1000 10/11/19 1300 10/25/19 1100     Response to Exercise   Blood Pressure (Admit)  112/56  124/84  --  118/60  110/64   Blood Pressure (Exercise)  134/70  146/72  --  138/72  122/84   Blood  Pressure (Exit)  130/64  122/64  --  124/70  96/60   Heart Rate (Admit)  70 bpm  63 bpm  --  69 bpm  60 bpm   Heart Rate (Exercise)  105 bpm  131 bpm  --  105 bpm  96 bpm   Heart Rate (Exit)  74 bpm  85 bpm  --  75 bpm  66 bpm   Oxygen Saturation (Admit)  97 %  --  --  --  --   Oxygen Saturation (Exercise)  95 %  --  --  --  --   Rating of Perceived Exertion (Exercise)  11  14  --  12  13   Perceived Dyspnea (Exercise)  1  --  --  --  --   Symptoms  SOB  --  --  --  none   Comments  walk test results  first day  --  --  --   Duration  --  --  --  Progress to 30 minutes of  aerobic without signs/symptoms of physical distress  Continue with 30 min of aerobic exercise without signs/symptoms of physical distress.   Intensity  --  --  --  THRR unchanged  THRR unchanged     Progression   Progression  --  --  --  --  Continue to progress workloads to maintain intensity without signs/symptoms of physical distress.   Average METs  --  --  --  --  2.42     Resistance Training   Training Prescription  --  --  --  Yes  Yes   Weight  --  --  --   3 lb  3 lbs   Reps  --  --  --  10-15  10-15     Interval Training   Interval Training  --  --  --  --  No     Treadmill   MPH  --  --  --  1.9  1.9   Grade  --  --  --  0  0   Minutes  --  --  --  15  15   METs  --  --  --  2.45  2.45     REL-XR   Level  --  --  --  1  1   Speed  --  --  --  50  --   Minutes  --  --  --  15  15   METs  --  --  --  3  3     T5 Nustep   Level  --  --  --  --  1   Minutes  --  --  --  --  15   METs  --  --  --  --  1.8     Home Exercise Plan   Plans to continue exercise at  --  --  Home (comment) walking staff videos  Home (comment) walking staff videos  Home (comment) walking staff videos   Frequency  --  --  Add 2 additional days to program exercise sessions.  Add 2 additional days to program exercise sessions.  Add 2  additional days to program exercise sessions.   Initial Home Exercises Provided  --  --   10/11/19  10/11/19  10/11/19   Row Name 11/09/19 1500 11/22/19 1400 12/08/19 1200 12/21/19 1000 01/01/20 1400     Response to Exercise   Blood Pressure (Admit)  122/60  122/80  116/60  128/64  134/72   Blood Pressure (Exercise)  136/70  122/70  120/60  142/64  164/78   Blood Pressure (Exit)  122/62  124/64  92/60  100/60  120/64   Heart Rate (Admit)  83 bpm  78 bpm  79 bpm  70 bpm  84 bpm   Heart Rate (Exercise)  90 bpm  95 bpm  98 bpm  101 bpm  107 bpm   Heart Rate (Exit)  76 bpm  66 bpm  75 bpm  71 bpm  80 bpm   Rating of Perceived Exertion (Exercise)  _0 Symptoms  none  none  none  none  none   Duration  Continue with 30 min of aerobic exercise without signs/symptoms of physical distress.  Continue with 30 min of aerobic exercise without signs/symptoms of physical distress.  Continue with 30 min of aerobic exercise without signs/symptoms of physical distress.  Continue with 30 min of aerobic exercise without signs/symptoms of physical distress.  Continue with 30 min of aerobic exercise without signs/symptoms of physical distress.   Intensity  THRR unchanged  THRR unchanged  THRR unchanged  THRR unchanged  THRR unchanged     Progression   Progression  Continue to progress workloads to maintain intensity without signs/symptoms of physical distress.  Continue to progress workloads to maintain intensity without signs/symptoms of physical distress.  Continue to progress workloads to maintain intensity without signs/symptoms of physical distress.  Continue to progress workloads to maintain intensity without signs/symptoms of physical distress.  Continue to progress workloads to maintain intensity without signs/symptoms of physical distress.   Average METs  2.85  2.25  2.32  2.65  1.8     Resistance Training   Training Prescription  Yes  Yes  Yes  Yes  Yes   Weight   3lb   3lb  3 lb  3 lb  3 lb   Reps  10-15  10-15  10-15  10-15  10-15     Interval Training   Interval Training   No  No  No  No  No     Arm Ergometer   Level  1  --  --  1  1   Minutes  15  --  --  15  15   METs  2.1  --  --  1.5  1.5     REL-XR   Level  _1 --  --   Speed  50  --  --  --  --   Minutes  _2 --  --   METs  3.9  2.8  3.1  --  --     T5 Nustep   Level  _3 --  --   Minutes  _4 --  --   METs  1.8  1.7  2  --  --     Biostep-RELP   Level  2  --  --  2  2   SPM  50  --  --  50  50   Minutes  15  --  --  15  15   METs  2  --  --  2  2     Home Exercise Plan   Plans to continue exercise at  Home (comment) walking staff videos  Home (comment) walking staff videos  Home (comment) walking staff videos  Home (comment) walking staff videos  Home (comment) walking staff videos   Frequency  Add 2 additional days to program exercise sessions.  Add 2 additional days to program exercise sessions.  Add 2 additional days to program exercise sessions.  Add 2 additional days to program exercise sessions.  Add 2 additional days to program exercise sessions.   Initial Home Exercises Provided  10/11/19  10/11/19  10/11/19  10/11/19  10/11/19      Exercise Comments: Exercise Comments    Row Name 08/30/19 1049 09/04/19 1056         Exercise Comments  First full day of exercise!  Patient was oriented to gym and equipment including functions, settings, policies, and procedures.  Patient's individual exercise prescription and treatment plan were reviewed.  All starting workloads were established based on the results of the 6 minute walk test done at initial orientation visit.  The plan for exercise progression was also introduced and progression will be customized based on patient's performance and goals.  Kelsey Carter did not complete her rehab session.   After placing Kelsey Carter on the monitor- noted fast heartrate and irregular rhythm.  Looking at previous strips she was in NSR.  Called MD office and she has appointment to see PA/NP at 230. No exercise, Sent home to go to MD  appt today. Advised if she feels any symptoms SOB, Chest pain/tightness to call 911.         Exercise Goals and Review: Exercise Goals    Row Name 08/24/19 1039             Exercise Goals   Increase Physical Activity  Yes       Intervention  Provide advice, education, support and counseling about physical activity/exercise needs.;Develop an individualized exercise prescription for aerobic and resistive training based on initial evaluation findings, risk stratification, comorbidities and participant's personal goals.       Expected Outcomes  Short Term: Attend rehab on a regular basis to increase amount of physical activity.;Long Term: Add in home exercise to make exercise part of routine and to increase amount of physical activity.;Long Term: Exercising regularly at least 3-5 days a week.       Increase Strength and Stamina  Yes       Intervention  Provide advice, education, support and counseling about physical activity/exercise needs.;Develop an individualized exercise prescription for aerobic and resistive training based on initial evaluation findings, risk stratification, comorbidities and participant's personal goals.       Expected Outcomes  Short Term: Increase workloads from initial exercise prescription for resistance, speed, and METs.;Short Term: Perform resistance training exercises routinely during rehab and add in resistance training at home;Long Term: Improve cardiorespiratory fitness, muscular endurance and strength as measured by increased METs and functional capacity (6MWT)       Able to understand and use rate of perceived exertion (RPE) scale  Yes       Intervention  Provide education and explanation on how to use RPE scale       Expected Outcomes  Short Term: Able to use RPE daily in rehab to express subjective intensity level;Long Term:  Able to use RPE to guide intensity level when exercising independently       Able to understand and use Dyspnea scale  Yes        Intervention  Provide education and explanation on how to use Dyspnea scale       Expected Outcomes  Short Term: Able to use Dyspnea scale daily in rehab to express subjective sense of shortness of breath during exertion;Long Term: Able to use Dyspnea scale to guide intensity level when exercising independently       Knowledge and understanding of Target Heart Rate Range (THRR)  Yes       Intervention  Provide education and explanation of THRR including how the numbers were predicted and where they are located for reference       Expected Outcomes  Short Term: Able to state/look up THRR;Short Term: Able to use daily as guideline for intensity in rehab;Long Term: Able to use THRR to govern intensity when exercising independently       Able to check pulse independently  Yes       Intervention  Provide education and demonstration on how to check pulse in carotid and radial arteries.;Review the importance of being able to check your own pulse for safety during independent exercise       Expected Outcomes  Short Term: Able to explain why pulse checking is important during independent exercise;Long Term: Able to check pulse independently and accurately       Understanding of Exercise Prescription  Yes       Intervention  Provide education, explanation, and written materials on patient's individual exercise prescription       Expected Outcomes  Short Term: Able to explain program exercise prescription;Long Term: Able to explain home exercise prescription to exercise independently          Exercise Goals Re-Evaluation : Exercise Goals Re-Evaluation    Row Name 08/30/19 1049 10/03/19 1001 10/11/19 1055 10/25/19 1151 11/09/19 1541     Exercise Goal Re-Evaluation   Exercise Goals Review  Increase Strength and Stamina;Increase Physical Activity;Able to understand and use rate of perceived exertion (RPE) scale;Knowledge and understanding of Target Heart Rate Range (THRR);Understanding of Exercise Prescription   --  Increase Strength and Stamina;Increase Physical Activity;Able to understand and use rate of perceived exertion (RPE) scale;Knowledge and understanding of Target Heart Rate Range (THRR);Understanding of Exercise Prescription;Able to check pulse independently  Increase Physical Activity;Increase Strength and Stamina;Understanding of Exercise Prescription  Increase Physical Activity;Increase Strength and Stamina;Able to understand and use rate of perceived exertion (RPE) scale;Knowledge and understanding of Target Heart Rate Range (THRR);Able to check pulse independently;Understanding of Exercise Prescription   Comments  Reviewed RPE scale, THR and program prescription with pt today.  Pt voiced understanding and was given a copy of goals to take home.  out since last review  Reviewed home exercise with pt today.  Pt plans to walking at home and staff videos for exercise.  Reviewed THR, pulse, RPE, sign and symptoms, and when to call 911 or MD.  Also discussed weather considerations and indoor options.  Pt voiced understanding.  Kelsey Carter has returned to exericse.  She only had three visits during the month of October.  She was encouraged to attend regularly again.  We will continue to monitor her progress.  Kelsey Carter has been attending more regularly in November.  She is working in RPE 11-15.   Expected Outcomes  Short: Use RPE daily to regulate intensity. Long: Follow program prescription in  THR.  --  Short: Start to walk at home.  Long: Exercise independently  Short: Attend rehab regularly.  Long: Continue to rebuild stamina.  Short - continue to attend consistently Long - improve overall MET level   Row Name 11/20/19 1026 12/04/19 1031 12/08/19 1204 12/21/19 1030 01/01/20 1524     Exercise Goal Re-Evaluation   Exercise Goals Review  Increase Physical Activity;Increase Strength and Stamina;Able to understand and use rate of perceived exertion (RPE) scale;Knowledge and understanding of Target Heart Rate Range  (THRR);Able to check pulse independently;Understanding of Exercise Prescription  Increase Physical Activity;Increase Strength and Stamina;Able to understand and use rate of perceived exertion (RPE) scale;Knowledge and understanding of Target Heart Rate Range (THRR);Able to check pulse independently;Understanding of Exercise Prescription  Increase Physical Activity;Increase Strength and Stamina;Able to understand and use rate of perceived exertion (RPE) scale;Able to understand and use Dyspnea scale;Knowledge and understanding of Target Heart Rate Range (THRR);Able to check pulse independently;Understanding of Exercise Prescription  Increase Physical Activity;Increase Strength and Stamina;Able to understand and use rate of perceived exertion (RPE) scale;Knowledge and understanding of Target Heart Rate Range (THRR);Able to check pulse independently;Understanding of Exercise Prescription  Increase Physical Activity;Increase Strength and Stamina;Able to understand and use rate of perceived exertion (RPE) scale;Able to understand and use Dyspnea scale;Knowledge and understanding of Target Heart Rate Range (THRR);Able to check pulse independently;Understanding of Exercise Prescription   Comments  Kelsey Carter reports that she is using a peddling machine on off days of rehab. She reported that in rehab some of her workloads are starting to seem easier. Guidelines on properly increasing workloads was given.  Kelsey Carter is doing well in rehab.  She is now up to level 6 on the XR and is doing her home exercise.  She usually gets her one extra day at home.  And she well also walk when she shops.  She is planning to join the [employer] when she graduates.  Overall she is feeling better.  --  Kelsey Carter continues to do well. Staff will monitor progress.  Kelsey Carter attends the [employer] on days not at [facility] and rides the bike.   Expected Outcomes  Short: Increase workload on XR machine to level 3 next week. Long: Continue to gain strengh and stamina and increase MET  levels.  Short: Continue to improve workloads. Long: Continue to exercise independently  --  Short - continue home exercise and attend program sessions Long - increase stamina  Short - continue to exercise consistently Long - improve MET level   Row Name 01/15/20 1546             Exercise Goal Re-Evaluation   Exercise Goals Review  Increase Physical Activity;Increase Strength and Stamina;Able to understand and use rate of perceived exertion (RPE) scale;Able to understand and use Dyspnea scale;Knowledge and understanding of Target Heart Rate Range (THRR);Able to check pulse independently;Understanding of Exercise Prescription       Comments  Kelsey Carter attends the [employer] 2-3 days a week and uses a machine similar to the biostep; up to 5 miles (1 hour sessions). Pt reports doing seated exercises when watching TV sometimes. Encouraged pt to exercise most days of the week and include some resistance training.       Expected Outcomes  Short - continue to exercise consistently Long - improve MET level          Discharge Exercise Prescription (Final Exercise Prescription Changes): Exercise Prescription Changes - 01/01/20 1400      Response to Exercise   Blood  Pressure (Admit)  134/72    Blood Pressure (Exercise)  164/78    Blood Pressure (Exit)  120/64    Heart Rate (Admit)  84 bpm    Heart Rate (Exercise)  107 bpm    Heart Rate (Exit)  80 bpm    Rating of Perceived Exertion (Exercise)  15    Symptoms  none    Duration  Continue with 30 min of aerobic exercise without signs/symptoms of physical distress.    Intensity  THRR unchanged      Progression   Progression  Continue to progress workloads to maintain intensity without signs/symptoms of physical distress.    Average METs  1.8      Resistance Training   Training Prescription  Yes    Weight  3 lb    Reps  10-15      Interval Training   Interval Training  No      Arm Ergometer   Level  1    Minutes  15    METs  1.5      Biostep-RELP    Level  2    SPM  50    Minutes  15    METs  2      Home Exercise Plan   Plans to continue exercise at  Home (comment)   walking staff videos   Frequency  Add 2 additional days to program exercise sessions.    Initial Home Exercises Provided  10/11/19       Nutrition:  Target Goals: Understanding of nutrition guidelines, daily intake of sodium <1581m, cholesterol <2010m calories 30% from fat and 7% or less from saturated fats, daily to have 5 or more servings of fruits and vegetables.  Biometrics: Pre Biometrics - 08/24/19 1040      Pre Biometrics   Height  5' 4.1" (1.628 m)    Weight  170 lb (77.1 kg)    BMI (Calculated)  29.09    Single Leg Stand  1.56 seconds        Nutrition Therapy Plan and Nutrition Goals: Nutrition Therapy & Goals - 08/24/19 1034      Nutrition Therapy   Diet  Low Na, HH eating    Protein (specify units)  65g    Fiber  25 grams    Whole Grain Foods  3 servings    Saturated Fats  12 max. grams    Fruits and Vegetables  5 servings/day    Sodium  1.5 grams      Personal Nutrition Goals   Nutrition Goal  ST: add more protein, make sure eating enough kcal LT: get more energy and strength    Comments  Pt reports eating raisin bread toast with some butter (WG) or instant oatmeal (pt reports the lower sugar one) or yogurt (pt reports 60kcal fruit one). Dinner is usually chicken (breast or thigh and skin) with vegetables (occasionally regular pasta , but small amount). Discussed protein and kcal needs and that she doesn't seem to be getting enough especially not enough to support exercise and muscle building. Talked about maybe a snack before and after exercise (pt wants to do a kind bar before and greek yogurt after). Talked about healthy snacking and how to balance snacks (fat and prtoein especially). Pt reports will sometimes have a yogurt in the middle of the day and will add jello or pudding cup if she feels like she isn't getting enough. Discussed HH  eating and weight loss vs healthy outcomes. Discussed  that we will review energy and strength levels during program to ensure she is getting enough.      Intervention Plan   Intervention  Prescribe, educate and counsel regarding individualized specific dietary modifications aiming towards targeted core components such as weight, hypertension, lipid management, diabetes, heart failure and other comorbidities.;Nutrition handout(s) given to patient.    Expected Outcomes  Short Term Goal: Understand basic principles of dietary content, such as calories, fat, sodium, cholesterol and nutrients.;Short Term Goal: A plan has been developed with personal nutrition goals set during dietitian appointment.;Long Term Goal: Adherence to prescribed nutrition plan.       Nutrition Assessments: Nutrition Assessments - 08/24/19 1314      MEDFICTS Scores   Pre Score  15       Nutrition Goals Re-Evaluation: Nutrition Goals Re-Evaluation    Row Name 10/09/19 1050 10/30/19 1016 12/12/19 0828 01/15/20 1544       Goals   Current Weight  168 lb 4.8 oz (76.3 kg)  164 lb (74.4 kg)  --  --    Nutrition Goal  ST: add more protein, make sure eating enough kcal LT: get more energy and strength  ST: add more protein, make sure eating enough kcal LT: get more energy and strength, get off medicince  ST: add more protein, make sure eating enough kcal LT: get more energy and strength, get off medicince  LT: get more energy and strength, get off medicince    Comment  Pt reports doing well, strength and energy is good, but pt reports having some heart problems she is working with her doctor to resolve. Pt reports adding a protein bar in between meals. Pt reports only hungry enough for two meals, discussed if energy and/or strength begins to deminish or not continue to imporve or if her weight falls too rapidly that we would have to implement high kcal and protein MNT as well as mechanical eating.  Pt reports now 162lbs from her  scale - goal is 155lbs. Energy is ok, pt is doing more around the house. Pt still continuing to add protein with protein bars. pt reports strength is better than when we last spoke.  Continue current changes  Continue current changes; strength is good and energy is stable. Pt continues to lose weight. Pt would not like to make any further nutrition goals at this time.    Expected Outcome  ST: add more protein, make sure eating enough kcal LT: get more energy and strength  ST: add more protein, make sure eating enough kcal LT: get more energy and strength  ST: add more protein, make sure eating enough kcal LT: get more energy and strength  LT: get more energy and strength, get off medicince       Nutrition Goals Discharge (Final Nutrition Goals Re-Evaluation): Nutrition Goals Re-Evaluation - 01/15/20 1544      Goals   Nutrition Goal  LT: get more energy and strength, get off medicince    Comment  Continue current changes; strength is good and energy is stable. Pt continues to lose weight. Pt would not like to make any further nutrition goals at this time.    Expected Outcome  LT: get more energy and strength, get off medicince       Psychosocial: Target Goals: Acknowledge presence or absence of significant depression and/or stress, maximize coping skills, provide positive support system. Participant is able to verbalize types and ability to use techniques and skills needed for reducing stress and depression.  Initial Review & Psychosocial Screening: Initial Psych Review & Screening - 08/23/19 1134      Initial Review   Current issues with  Current Sleep Concerns   sleepy throughout the day     Family Dynamics   Good Support System?  Yes      Barriers   Psychosocial barriers to participate in program  There are no identifiable barriers or psychosocial needs.;The patient should benefit from training in stress management and relaxation.      Screening Interventions   Interventions   Encouraged to exercise;To provide support and resources with identified psychosocial needs;Provide feedback about the scores to participant    Expected Outcomes  Short Term goal: Utilizing psychosocial counselor, staff and physician to assist with identification of specific Stressors or current issues interfering with healing process. Setting desired goal for each stressor or current issue identified.;Long Term Goal: Stressors or current issues are controlled or eliminated.;Short Term goal: Identification and review with participant of any Quality of Life or Depression concerns found by scoring the questionnaire.;Long Term goal: The participant improves quality of Life and PHQ9 Scores as seen by post scores and/or verbalization of changes       Quality of Life Scores:  Quality of Life - 08/24/19 1058      Quality of Life   Select  Quality of Life      Quality of Life Scores   Health/Function Pre  22.96 %    Socioeconomic Pre  27.14 %    Psych/Spiritual Pre  27.86 %    Family Pre  26.4 %    GLOBAL Pre  25.41 %      Scores of 19 and below usually indicate a poorer quality of life in these areas.  A difference of  2-3 points is a clinically meaningful difference.  A difference of 2-3 points in the total score of the Quality of Life Index has been associated with significant improvement in overall quality of life, self-image, physical symptoms, and general health in studies assessing change in quality of life.  PHQ-9: Recent Review Flowsheet Data    Depression screen [facility] 2/9 08/24/2019 09/21/2018 03/31/2018 03/03/2018 03/02/2018   Decreased Interest 0 0 0 0 0   Down, Depressed, Hopeless 0 0 0 0 0   PHQ - 2 Score 0 0 0 0 0   Altered sleeping 0 0 0 - -   Tired, decreased energy 1 0 0 - -   Change in appetite 0 0 0 - -   Feeling bad or failure about yourself  0 0 0 - -   Trouble concentrating 0 0 0 - -   Moving slowly or fidgety/restless 0 0 0 - -   Suicidal thoughts 0 0 0 - -   PHQ-9 Score 1 0  0 - -   Difficult doing work/chores Not difficult at all - Not difficult at all - -     Interpretation of Total Score  Total Score Depression Severity:  1-4 = Minimal depression, 5-9 = Mild depression, 10-14 = Moderate depression, 15-19 = Moderately severe depression, 20-27 = Severe depression   Psychosocial Evaluation and Intervention:   Psychosocial Re-Evaluation: Psychosocial Re-Evaluation    Row Name 10/11/19 1034 11/20/19 1028 12/04/19 1034 01/15/20 1543       Psychosocial Re-Evaluation   Current issues with  Current Sleep Concerns  None Identified  None Identified  None Identified    Comments  Kelsey Carter says that she has changed primary doctors because she has  neuropathy and her old doctor cut her medicine in half. She has to see her doctor yet to get her prescriptions in order. Her sleep has been ok but could be better. She takes a lot of medicine and has to take thinners and she is coming off of some of them when her doctor tells her to.  Kelsey Carter reports no new stress concerns,  sleeping well, and having a strong support system. She is still working with her doctors on the amount of blood thinners she is taking and may come off of one at her next appointment.  Kelsey Carter is doing well in rehab.  She is pleased with her progress but still wants to come off her blood thinners.  She is bored at home, but managing.  She continues to sleep well.  Kelsey Carter is doing well in rehab.  She is bored at home, but managing.  She continues to sleep well. Pt reports her support system is good.    Expected Outcomes  Short: attend class to reduce stress and improve sleep. Long: maintain medications to help sleep.  Short: attend class to reduce stress. Long: maintain good mental health with healthy habbits.  Short: Continue to work towards graduation.  Long: Continue to stay positive.  Short: Continue to work towards graduation.  Long: Continue to stay positive.    Interventions  Encouraged to attend Cardiac Rehabilitation for  the exercise  --  Encouraged to attend Cardiac Rehabilitation for the exercise  Encouraged to attend Cardiac Rehabilitation for the exercise    Continue Psychosocial Services   Follow up required by staff  Follow up required by staff  --  Follow up required by staff       Psychosocial Discharge (Final Psychosocial Re-Evaluation): Psychosocial Re-Evaluation - 01/15/20 1543      Psychosocial Re-Evaluation   Current issues with  None Identified    Comments  Kelsey Carter is doing well in rehab.  She is bored at home, but managing.  She continues to sleep well. Pt reports her support system is good.    Expected Outcomes  Short: Continue to work towards graduation.  Long: Continue to stay positive.    Interventions  Encouraged to attend Cardiac Rehabilitation for the exercise    Continue Psychosocial Services   Follow up required by staff       Vocational Rehabilitation: Provide vocational rehab assistance to qualifying candidates.   Vocational Rehab Evaluation & Intervention: Vocational Rehab - 08/23/19 1136      Initial Vocational Rehab Evaluation & Intervention   Assessment shows need for Vocational Rehabilitation  No       Education: Education Goals: Education classes will be provided on a variety of topics geared toward better understanding of heart health and risk factor modification. Participant will state understanding/return demonstration of topics presented as noted by education test scores.  Learning Barriers/Preferences: Learning Barriers/Preferences - 08/23/19 1136      Learning Barriers/Preferences   Learning Barriers  None    Learning Preferences  None       Education Topics:  AED/CPR: - Group verbal and written instruction with the use of models to demonstrate the basic use of the AED with the basic ABC's of resuscitation.   General Nutrition Guidelines/Fats and Fiber: -Group instruction provided by verbal, written material, models and posters to present the general  guidelines for heart healthy nutrition. Gives an explanation and review of dietary fats and fiber.   Controlling Sodium/Reading Food Labels: -Group verbal and written material supporting the  discussion of sodium use in heart healthy nutrition. Review and explanation with models, verbal and written materials for utilization of the food label.   Exercise Physiology & General Exercise Guidelines: - Group verbal and written instruction with models to review the exercise physiology of the cardiovascular system and associated critical values. Provides general exercise guidelines with specific guidelines to those with heart or lung disease.    Aerobic Exercise & Resistance Training: - Gives group verbal and written instruction on the various components of exercise. Focuses on aerobic and resistive training programs and the benefits of this training and how to safely progress through these programs..   Cardiac Rehab from 10/11/2019 in [facility] Cardiac and Pulmonary Rehab  Date  10/11/19  Educator  AS  Instruction Review Code  1- Verbalizes Understanding      Flexibility, Balance, Mind/Body Relaxation: Provides group verbal/written instruction on the benefits of flexibility and balance training, including mind/body exercise modes such as yoga, pilates and tai chi.  Demonstration and skill practice provided.   Stress and Anxiety: - Provides group verbal and written instruction about the health risks of elevated stress and causes of high stress.  Discuss the correlation between heart/lung disease and anxiety and treatment options. Review healthy ways to manage with stress and anxiety.   Depression: - Provides group verbal and written instruction on the correlation between heart/lung disease and depressed mood, treatment options, and the stigmas associated with seeking treatment.   Anatomy & Physiology of the Heart: - Group verbal and written instruction and models provide basic cardiac anatomy and  physiology, with the coronary electrical and arterial systems. Review of Valvular disease and Heart Failure   Cardiac Procedures: - Group verbal and written instruction to review commonly prescribed medications for heart disease. Reviews the medication, class of the drug, and side effects. Includes the steps to properly store meds and maintain the prescription regimen. (beta blockers and nitrates)   Cardiac Medications I: - Group verbal and written instruction to review commonly prescribed medications for heart disease. Reviews the medication, class of the drug, and side effects. Includes the steps to properly store meds and maintain the prescription regimen.   Cardiac Medications II: -Group verbal and written instruction to review commonly prescribed medications for heart disease. Reviews the medication, class of the drug, and side effects. (all other drug classes)   Cardiac Rehab from 10/11/2019 in [facility] Cardiac and Pulmonary Rehab  Date  10/11/19  Educator  SB  Instruction Review Code  1- Verbalizes Understanding       Go Sex-Intimacy & Heart Disease, Get SMART - Goal Setting: - Group verbal and written instruction through game format to discuss heart disease and the return to sexual intimacy. Provides group verbal and written material to discuss and apply goal setting through the application of the S.M.A.R.T. Method.   Other Matters of the Heart: - Provides group verbal, written materials and models to describe Stable Angina and Peripheral Artery. Includes description of the disease process and treatment options available to the cardiac patient.   Exercise & Equipment Safety: - Individual verbal instruction and demonstration of equipment use and safety with use of the equipment.   Cardiac Rehab from 10/11/2019 in [facility] Cardiac and Pulmonary Rehab  Date  08/24/19  Educator  [facility]  Instruction Review Code  1- Verbalizes Understanding      Infection Prevention: - Provides verbal  and written material to individual with discussion of infection control including proper hand washing and proper equipment cleaning during exercise  session.   Cardiac Rehab from 10/11/2019 in [facility] Cardiac and Pulmonary Rehab  Date  08/24/19  Educator  [facility]  Instruction Review Code  1- Verbalizes Understanding      Falls Prevention: - Provides verbal and written material to individual with discussion of falls prevention and safety.   Cardiac Rehab from 10/11/2019 in [facility] Cardiac and Pulmonary Rehab  Date  08/24/19  Educator  [facility]  Instruction Review Code  1- Verbalizes Understanding      Diabetes: - Individual verbal and written instruction to review signs/symptoms of diabetes, desired ranges of glucose level fasting, after meals and with exercise. Acknowledge that pre and post exercise glucose checks will be done for 3 sessions at entry of program.   Know Your Numbers and Risk Factors: -Group verbal and written instruction about important numbers in your health.  Discussion of what are risk factors and how they play a role in the disease process.  Review of Cholesterol, Blood Pressure, Diabetes, and BMI and the role they play in your overall health.   Cardiac Rehab from 10/11/2019 in [facility] Cardiac and Pulmonary Rehab  Date  10/11/19  Educator  SB  Instruction Review Code  1- Verbalizes Understanding      Sleep Hygiene: -Provides group verbal and written instruction about how sleep can affect your health.  Define sleep hygiene, discuss sleep cycles and impact of sleep habits. Review good sleep hygiene tips.    Other: -Provides group and verbal instruction on various topics (see comments)   Knowledge Questionnaire Score: Knowledge Questionnaire Score - 08/24/19 1057      Knowledge Questionnaire Score   Pre Score  19/26 Education Focus: Angina, Sex, HF, Nutrition, Exercise       Core Components/Risk Factors/Patient Goals at Admission: Personal Goals and Risk Factors at  Admission - 08/24/19 1041      Core Components/Risk Factors/Patient Goals on Admission    Weight Management  Yes;Weight Loss    Intervention  Weight Management: Develop a combined nutrition and exercise program designed to reach desired caloric intake, while maintaining appropriate intake of nutrient and fiber, sodium and fats, and appropriate energy expenditure required for the weight goal.;Weight Management: Provide education and appropriate resources to help participant work on and attain dietary goals.;Weight Management/Obesity: Establish reasonable short term and long term weight goals.    Admit Weight  170 lb (77.1 kg)    Goal Weight: Short Term  165 lb (74.8 kg)    Goal Weight: Long Term  160 lb (72.6 kg)    Expected Outcomes  Short Term: Continue to assess and modify interventions until short term weight is achieved;Long Term: Adherence to nutrition and physical activity/exercise program aimed toward attainment of established weight goal;Weight Loss: Understanding of general recommendations for a balanced deficit meal plan, which promotes 1-2 lb weight loss per week and includes a negative energy balance of [phone] kcal/d;Understanding recommendations for meals to include 15-35% energy as protein, 25-35% energy from fat, 35-60% energy from carbohydrates, less than 229m of dietary cholesterol, 20-35 gm of total fiber daily;Understanding of distribution of calorie intake throughout the day with the consumption of 4-5 meals/snacks    Improve shortness of breath with ADL's  Yes    Intervention  Provide education, individualized exercise plan and daily activity instruction to help decrease symptoms of SOB with activities of daily living.    Expected Outcomes  Short Term: Improve cardiorespiratory fitness to achieve a reduction of symptoms when performing ADLs;Long Term: Be able to perform more ADLs  without symptoms or delay the onset of symptoms    Hypertension  Yes    Intervention  Provide  education on lifestyle modifcations including regular physical activity/exercise, weight management, moderate sodium restriction and increased consumption of fresh fruit, vegetables, and low fat dairy, alcohol moderation, and smoking cessation.;Monitor prescription use compliance.    Expected Outcomes  Short Term: Continued assessment and intervention until BP is < 140/62m HG in hypertensive participants. < 130/867mHG in hypertensive participants with diabetes, heart failure or chronic kidney disease.;Long Term: Maintenance of blood pressure at goal levels.    Lipids  Yes    Intervention  Provide education and support for participant on nutrition & aerobic/resistive exercise along with prescribed medications to achieve LDL <7014mHDL >1m21m  Expected Outcomes  Short Term: Participant states understanding of desired cholesterol values and is compliant with medications prescribed. Participant is following exercise prescription and nutrition guidelines.;Long Term: Cholesterol controlled with medications as prescribed, with individualized exercise RX and with personalized nutrition plan. Value goals: LDL < 70mg58mL > 40 mg.       Core Components/Risk Factors/Patient Goals Review:  Goals and Risk Factor Review    Row Name 10/11/19 1037 11/20/19 1040 12/04/19 1032 01/15/20 1541       Core Components/Risk Factors/Patient Goals Review   Personal Goals Review  Weight Management/Obesity;Improve shortness of breath with ADL's;Lipids;Hypertension  Weight Management/Obesity;Improve shortness of breath with ADL's;Lipids;Hypertension  Weight Management/Obesity;Improve shortness of breath with ADL's;Lipids;Hypertension  Weight Management/Obesity;Improve shortness of breath with ADL's;Lipids;Hypertension    Review  Kelsey wCollie Siads to lose weight and so far she has lost about three pounds. She is taking her blood pressure medications everyday and her statin. She wants to achieve a weight of 155 pounds. She is eating  healthier and is ready to be lighter. Her shortness of breath is ok when she is cleaning and she watches when she gets short of breath.  Kelsey Carter that she is still losing weight and has met the goal of losing 5 lbs in 2 weeks. She plans to continue her exercise and eating habits to eventually reach goal weight of 155 lbs. Kelsey Carter her shortness of breath has impoved and she only gets short of breath when walking up a hill, but otherwise can accomplish most of her daily routine. Kelsey Carter taking all meds to control hypertension and lipies.  Kelsey cCollie Siadinues to do well in rehab.  Her weight continues to go down and this makes her happy.  Her blood pressures continue to do well.  Her SOB has greatly improved andshe feels better with all that she is doing.  Pt reports going to echo and stress test tomorrow 1/26. Pt reports her blood pressure and SOB is doing well, pt reports no trouble with ADL with SOB. [facility] Up to 5 miles on biostep (1 hour sessions).    Expected Outcomes  Short: lose 5 pounds in two weeks. Long: Weigh 155 pounds.  Short: continue to lose 1-2 pounds a week. Long: weigh 155 lbs.  Short: Continue to work on weight loss.  Long: Continue to monitor risk factors.  Short: Continue to work on weight loss.  Long: Continue to monitor risk factors.       Core Components/Risk Factors/Patient Goals at Discharge (Final Review):  Goals and Risk Factor Review - 01/15/20 1541      Core Components/Risk Factors/Patient Goals Review   Personal Goals Review  Weight Management/Obesity;Improve shortness of breath with  ADL's;Lipids;Hypertension    Review  Pt reports going to echo and stress test tomorrow 1/26. Pt reports her blood pressure and SOB is doing well, pt reports no trouble with ADL with SOB. [facility]; Up to 5 miles on biostep (1 hour sessions).    Expected Outcomes  Short: Continue to work on weight loss.  Long: Continue to monitor  risk factors.       ITP Comments: ITP Comments    Row Name 08/23/19 1138 08/24/19 1032 08/30/19 0546 09/04/19 1056 09/12/19 1109   ITP Comments  Virtual initial orientation completed. Diagnosis can be found in [facility] 8/12. EP/RD Orientation scheduled 9/3 at 9:30  Completed 6MWT, gym orientation, and RD evaluation. Initial ITP created and sent for review to Dr. Emily Filbert, Medical Director.  30 Day review. Continue with ITP unless directed changes per Medical Director review.  New to program attended orientation  ANALAURA MESSLER did not complete her rehab session.   After placing Kelsey Carter on the monitor- noted fast heartrate and irregular rhythm.  Looking at previous strips she was in NS[facility].  Called MD office and she has appointment to see PA/NP at 230. No exercise, Sent home to go to MD appt today. Advised if she feels any symptoms SOB, Chest pain/tightness to call 911.  Called check up on pt since we sent her to doctor for afib.  She has started some new medications.  She has a follow up appt on Friday and will ask about returning to rehab then.   Row Name 09/27/19 1117 10/03/19 1000 10/25/19 0613 11/22/19 0926 12/20/19 0830   ITP Comments  30 day review completed. ITP sent to Dr. Emily Filbert, Medical Director of Cardiac and Pulmonary Rehab. Continue with ITP unless changes are made by physician.  Department closed starting 10/2 until further notice by infection prevention and Health at Work teams for [place].  Called to check on pt.  She has been out since 9/9.  She was cleared to return to rehab on 10/5 and will be back next Monday.  30 day review completed. Continue with ITP sent to Dr. Emily Filbert, Medical Director of Cardiac and Pulmonary Rehab for review , changes as needed and signature.  30 day review competed . ITP sent to Dr Emily Filbert for review, changes as needed and ITP approval signature.  30 day review competed . ITP sent to Dr Emily Filbert for review, changes as needed and ITP approval signature    [place] Name 12/25/19 1047 01/08/20 1655 01/17/20 0945       ITP Comments  Kelsey Carter stated that she has joined the [facility] to be able to exercise on off days of rehab. She is doing the cardio equipment and stretching. She reports going 2 times per week. Kelsey Carter stated that she will be calling her Cardiologist about an issue she is having with her blood thinners ( Bruising and blood blisters).  Talked to Kelsey Carter to check in as this is her off week from inpatient class. She is doing well, trying to stay active given the cold weather. She is continuing to take her medication as prescribed and reports her BP feeling fine. She is planning on coming to class this coming Monday.  30 day review completed. ITP sent to Dr. Emily Filbert, Medical Director of Cardiac and Pulmonary Rehab. Continue with ITP unless changes are made by physician.  Department operating under reduced schedule until further notice by request from hospital leadership.  Comments: 30 day review

## 2020-01-22 ENCOUNTER — Encounter: Payer: PPO | Attending: Internal Medicine | Admitting: *Deleted

## 2020-01-22 ENCOUNTER — Other Ambulatory Visit: Payer: Self-pay

## 2020-01-22 DIAGNOSIS — Z955 Presence of coronary angioplasty implant and graft: Secondary | ICD-10-CM | POA: Insufficient documentation

## 2020-01-22 NOTE — Progress Notes (Signed)
Followed up with Kelsey Carter during her off week from Cardiac Rehab. She reported doing well and feeling fine. [facility]

## 2020-01-29 ENCOUNTER — Encounter: Payer: PPO | Admitting: *Deleted

## 2020-01-29 ENCOUNTER — Other Ambulatory Visit: Payer: Self-pay

## 2020-01-29 VITALS — Ht 64.1 in | Wt 165.5 lb

## 2020-01-29 DIAGNOSIS — Z955 Presence of coronary angioplasty implant and graft: Secondary | ICD-10-CM | POA: Diagnosis not present

## 2020-01-29 NOTE — Progress Notes (Signed)
Discharge Progress Report  Patient Details  Name: Kelsey Carter MRN: [id number] Date of Birth: 07/07/1946 Referring Provider:     Cardiac Rehab from 08/24/2019 in [facility]  Referring Provider  Josephina Gip MD       Number of Visits: 21  Reason for Discharge:  Patient independent in their exercise.  Smoking History:  Social History   Tobacco Use  Smoking Status Never Smoker  Smokeless Tobacco Never Used    Diagnosis:  Status post coronary artery stent placement  ADL [facility]:   Initial Exercise Prescription: Initial Exercise Prescription - 08/24/19 1000      Date of Initial Exercise RX and Referring Provider   Date  08/24/19    Referring Provider  Josephina Gip MD      Treadmill   MPH  1.9    Grade  0    Minutes  15    METs  2.45      Elliptical   Level  2    Speed  1    Minutes  15      REL-XR   Level  1    Speed  50    Minutes  15    METs  2      T5 Nustep   Level  1    SPM  80    Minutes  15    METs  2      Prescription Details   Frequency (times per week)  2    Duration  Progress to 30 minutes of continuous aerobic without signs/symptoms of physical distress      Intensity   THRR 40-80% of Max Heartrate  101-132    Ratings of Perceived Exertion  11-13    Perceived Dyspnea  0-4      Progression   Progression  Continue to progress workloads to maintain intensity without signs/symptoms of physical distress.      Resistance Training   Training Prescription  Yes    Weight  3lbs    Reps  10-15       Discharge Exercise Prescription (Final Exercise Prescription Changes): Exercise Prescription Changes - 01/19/20 1000      Response to Exercise   Blood Pressure (Admit)  122/70    Blood Pressure (Exercise)  138/68    Blood Pressure (Exit)  90/60    Heart Rate (Admit)  72 bpm    Heart Rate (Exercise)  93 bpm    Heart Rate (Exit)  71 bpm    Rating of Perceived Exertion (Exercise)  11    Symptoms  none     Duration  Continue with 30 min of aerobic exercise without signs/symptoms of physical distress.    Intensity  THRR unchanged      Progression   Progression  Continue to progress workloads to maintain intensity without signs/symptoms of physical distress.    Average METs  2.15      Resistance Training   Training Prescription  Yes    Weight  3 lb    Reps  10-15      Interval Training   Interval Training  No      NuStep   Level  5    Minutes  15    METs  2.1      Biostep-RELP   Level  2    Minutes  15    METs  2      Home Exercise Plan   Plans to continue exercise at  Home (comment)   walking staff videos   Frequency  Add 2 additional days to program exercise sessions.    Initial Home Exercises Provided  10/11/19       Functional Capacity: 6 Minute Walk    Row Name 08/24/19 1033 01/29/20 1624       6 Minute Walk   Phase  Initial  Discharge    Distance  1170 feet  1340 feet    Distance % Change  --  14.5 %    Distance Feet Change  --  170 ft    Walk Time  6 minutes  6 minutes    # of Rest Breaks  0  0    MPH  2.22  2.53    METS  2.43  2.67    RPE  1  12    Perceived Dyspnea   1  0    VO2 Peak  8.54  9.35    Symptoms  Yes (comment)  No    Comments  SOB  --    Resting HR  70 bpm  65 bpm    Resting BP  112/56  126/64    Resting Oxygen Saturation   97 %  --    Exercise Oxygen Saturation  during 6 min walk  95 %  --    Max Ex. HR  105 bpm  89 bpm    Max Ex. BP  134/70  142/74    2 Minute Post BP  130/64  --       Psychological, QOL, Others - Outcomes: PHQ 2/9: Depression screen [facility] 2/9 01/29/2020 08/24/2019 09/21/2018 03/31/2018 03/03/2018  Decreased Interest 0 0 0 0 0  Down, Depressed, Hopeless 0 0 0 0 0  PHQ - 2 Score 0 0 0 0 0  Altered sleeping 0 0 0 0 -  Tired, decreased energy 0 1 0 0 -  Change in appetite 0 0 0 0 -  Feeling bad or failure about yourself  0 0 0 0 -  Trouble concentrating 0 0 0 0 -  Moving slowly or fidgety/restless 0 0 0 0 -  Suicidal  thoughts 0 0 0 0 -  PHQ-9 Score 0 1 0 0 -  Difficult doing work/chores - Not difficult at all - Not difficult at all -    Quality of Life: Quality of Life - 01/29/20 1622      Quality of Life Scores   Health/Function Post  27.4 %    Socioeconomic Post  28.17 %    Psych/Spiritual Post  28.29 %    Family Post  27.6 %    GLOBAL Post  27.76 %       Personal Goals: Goals established at orientation with interventions provided to work toward goal. Personal Goals and Risk Factors at Admission - 08/24/19 1041      Core Components/Risk Factors/Patient Goals on Admission    Weight Management  Yes;Weight Loss    Intervention  Weight Management: Develop a combined nutrition and exercise program designed to reach desired caloric intake, while maintaining appropriate intake of nutrient and fiber, sodium and fats, and appropriate energy expenditure required for the weight goal.;Weight Management: Provide education and appropriate resources to help participant work on and attain dietary goals.;Weight Management/Obesity: Establish reasonable short term and long term weight goals.    Admit Weight  170 lb (77.1 kg)    Goal Weight: Short Term  165 lb (74.8 kg)    Goal Weight: Long Term  160 lb (72.6 kg)    Expected Outcomes  Short Term: Continue to assess and modify interventions until short term weight is achieved;Long Term: Adherence to nutrition and physical activity/exercise program aimed toward attainment of established weight goal;Weight Loss: Understanding of general recommendations for a balanced deficit meal plan, which promotes 1-2 lb weight loss per week and includes a negative energy balance of [phone] kcal/d;Understanding recommendations for meals to include 15-35% energy as protein, 25-35% energy from fat, 35-60% energy from carbohydrates, less than '200mg'$  of dietary cholesterol, 20-35 gm of total fiber daily;Understanding of distribution of calorie intake throughout the day with the consumption  of 4-5 meals/snacks    Improve shortness of breath with ADL's  Yes    Intervention  Provide education, individualized exercise plan and daily activity instruction to help decrease symptoms of SOB with activities of daily living.    Expected Outcomes  Short Term: Improve cardiorespiratory fitness to achieve a reduction of symptoms when performing ADLs;Long Term: Be able to perform more ADLs without symptoms or delay the onset of symptoms    Hypertension  Yes    Intervention  Provide education on lifestyle modifcations including regular physical activity/exercise, weight management, moderate sodium restriction and increased consumption of fresh fruit, vegetables, and low fat dairy, alcohol moderation, and smoking cessation.;Monitor prescription use compliance.    Expected Outcomes  Short Term: Continued assessment and intervention until BP is < 140/51m HG in hypertensive participants. < 130/887mHG in hypertensive participants with diabetes, heart failure or chronic kidney disease.;Long Term: Maintenance of blood pressure at goal levels.    Lipids  Yes    Intervention  Provide education and support for participant on nutrition & aerobic/resistive exercise along with prescribed medications to achieve LDL '70mg'$ , HDL >'40mg'$ .    Expected Outcomes  Short Term: Participant states understanding of desired cholesterol values and is compliant with medications prescribed. Participant is following exercise prescription and nutrition guidelines.;Long Term: Cholesterol controlled with medications as prescribed, with individualized exercise RX and with personalized nutrition plan. Value goals: LDL < '70mg'$ , HDL > 40 mg.        Personal Goals Discharge: Goals and Risk Factor Review    Row Name 10/11/19 1037 11/20/19 1040 12/04/19 1032 01/15/20 1541       Core Components/Risk Factors/Patient Goals Review   Personal Goals Review  Weight Management/Obesity;Improve shortness of breath with ADL's;Lipids;Hypertension   Weight Management/Obesity;Improve shortness of breath with ADL's;Lipids;Hypertension  Weight Management/Obesity;Improve shortness of breath with ADL's;Lipids;Hypertension  Weight Management/Obesity;Improve shortness of breath with ADL's;Lipids;Hypertension    Review  SuCollie Siadants to lose weight and so far she has lost about three pounds. She is taking her blood pressure medications everyday and her statin. She wants to achieve a weight of 155 pounds. She is eating healthier and is ready to be lighter. Her shortness of breath is ok when she is cleaning and she watches when she gets short of breath.  SuCollie Siadeports that she is still losing weight and has met the goal of losing 5 lbs in 2 weeks. She plans to continue her exercise and eating habits to eventually reach goal weight of 155 lbs. SuCollie Siadeports her shortness of breath has impoved and she only gets short of breath when walking up a hill, but otherwise can accomplish most of her daily routine. SuCollie Siadeports taking all meds to control hypertension and lipies.  SuCollie Siadontinues to do well in rehab.  Her weight continues to go down and this makes her happy.  Her blood  pressures continue to do well.  Her SOB has greatly improved andshe feels better with all that she is doing.  Pt reports going to echo and stress test tomorrow 1/26. Pt reports her blood pressure and SOB is doing well, pt reports no trouble with ADL with SOB. [facility]; Up to 5 miles on biostep (1 hour sessions).    Expected Outcomes  Short: lose 5 pounds in two weeks. Long: Weigh 155 pounds.  Short: continue to lose 1-2 pounds a week. Long: weigh 155 lbs.  Short: Continue to work on weight loss.  Long: Continue to monitor risk factors.  Short: Continue to work on weight loss.  Long: Continue to monitor risk factors.       Exercise Goals and Review: Exercise Goals    Row Name 08/24/19 1039             Exercise Goals   Increase Physical Activity  Yes       Intervention   Provide advice, education, support and counseling about physical activity/exercise needs.;Develop an individualized exercise prescription for aerobic and resistive training based on initial evaluation findings, risk stratification, comorbidities and participant's personal goals.       Expected Outcomes  Short Term: Attend rehab on a regular basis to increase amount of physical activity.;Long Term: Add in home exercise to make exercise part of routine and to increase amount of physical activity.;Long Term: Exercising regularly at least 3-5 days a week.       Increase Strength and Stamina  Yes       Intervention  Provide advice, education, support and counseling about physical activity/exercise needs.;Develop an individualized exercise prescription for aerobic and resistive training based on initial evaluation findings, risk stratification, comorbidities and participant's personal goals.       Expected Outcomes  Short Term: Increase workloads from initial exercise prescription for resistance, speed, and METs.;Short Term: Perform resistance training exercises routinely during rehab and add in resistance training at home;Long Term: Improve cardiorespiratory fitness, muscular endurance and strength as measured by increased METs and functional capacity (6MWT)       Able to understand and use rate of perceived exertion (RPE) scale  Yes       Intervention  Provide education and explanation on how to use RPE scale       Expected Outcomes  Short Term: Able to use RPE daily in rehab to express subjective intensity level;Long Term:  Able to use RPE to guide intensity level when exercising independently       Able to understand and use Dyspnea scale  Yes       Intervention  Provide education and explanation on how to use Dyspnea scale       Expected Outcomes  Short Term: Able to use Dyspnea scale daily in rehab to express subjective sense of shortness of breath during exertion;Long Term: Able to use Dyspnea scale to  guide intensity level when exercising independently       Knowledge and understanding of Target Heart Rate Range (THRR)  Yes       Intervention  Provide education and explanation of THRR including how the numbers were predicted and where they are located for reference       Expected Outcomes  Short Term: Able to state/look up THRR;Short Term: Able to use daily as guideline for intensity in rehab;Long Term: Able to use THRR to govern intensity when exercising independently       Able to  check pulse independently  Yes       Intervention  Provide education and demonstration on how to check pulse in carotid and radial arteries.;Review the importance of being able to check your own pulse for safety during independent exercise       Expected Outcomes  Short Term: Able to explain why pulse checking is important during independent exercise;Long Term: Able to check pulse independently and accurately       Understanding of Exercise Prescription  Yes       Intervention  Provide education, explanation, and written materials on patient's individual exercise prescription       Expected Outcomes  Short Term: Able to explain program exercise prescription;Long Term: Able to explain home exercise prescription to exercise independently          Exercise Goals Re-Evaluation: Exercise Goals Re-Evaluation    Row Name 08/30/19 1049 10/03/19 1001 10/11/19 1055 10/25/19 1151 11/09/19 1541     Exercise Goal Re-Evaluation   Exercise Goals Review  Increase Strength and Stamina;Increase Physical Activity;Able to understand and use rate of perceived exertion (RPE) scale;Knowledge and understanding of Target Heart Rate Range (THRR);Understanding of Exercise Prescription  --  Increase Strength and Stamina;Increase Physical Activity;Able to understand and use rate of perceived exertion (RPE) scale;Knowledge and understanding of Target Heart Rate Range (THRR);Understanding of Exercise Prescription;Able to check pulse independently   Increase Physical Activity;Increase Strength and Stamina;Understanding of Exercise Prescription  Increase Physical Activity;Increase Strength and Stamina;Able to understand and use rate of perceived exertion (RPE) scale;Knowledge and understanding of Target Heart Rate Range (THRR);Able to check pulse independently;Understanding of Exercise Prescription   Comments  Reviewed RPE scale, THR and program prescription with pt today.  Pt voiced understanding and was given a copy of goals to take home.  out since last review  Reviewed home exercise with pt today.  Pt plans to walking at home and staff videos for exercise.  Reviewed THR, pulse, RPE, sign and symptoms, and when to call 911 or MD.  Also discussed weather considerations and indoor options.  Pt voiced understanding.  Collie Siad has returned to exericse.  She only had three visits during the month of October.  She was encouraged to attend regularly again.  We will continue to monitor her progress.  Collie Siad has been attending more regularly in November.  She is working in RPE 11-15.   Expected Outcomes  Short: Use RPE daily to regulate intensity. Long: Follow program prescription in THR.  --  Short: Start to walk at home.  Long: Exercise independently  Short: Attend rehab regularly.  Long: Continue to rebuild stamina.  Short - continue to attend consistently Long - improve overall MET level   Row Name 11/20/19 1026 12/04/19 1031 12/08/19 1204 12/21/19 1030 01/01/20 1524     Exercise Goal Re-Evaluation   Exercise Goals Review  Increase Physical Activity;Increase Strength and Stamina;Able to understand and use rate of perceived exertion (RPE) scale;Knowledge and understanding of Target Heart Rate Range (THRR);Able to check pulse independently;Understanding of Exercise Prescription  Increase Physical Activity;Increase Strength and Stamina;Able to understand and use rate of perceived exertion (RPE) scale;Knowledge and understanding of Target Heart Rate Range (THRR);Able to  check pulse independently;Understanding of Exercise Prescription  Increase Physical Activity;Increase Strength and Stamina;Able to understand and use rate of perceived exertion (RPE) scale;Able to understand and use Dyspnea scale;Knowledge and understanding of Target Heart Rate Range (THRR);Able to check pulse independently;Understanding of Exercise Prescription  Increase Physical Activity;Increase Strength and Stamina;Able to understand and use  rate of perceived exertion (RPE) scale;Knowledge and understanding of Target Heart Rate Range (THRR);Able to check pulse independently;Understanding of Exercise Prescription  Increase Physical Activity;Increase Strength and Stamina;Able to understand and use rate of perceived exertion (RPE) scale;Able to understand and use Dyspnea scale;Knowledge and understanding of Target Heart Rate Range (THRR);Able to check pulse independently;Understanding of Exercise Prescription   Comments  Collie Siad reports that she is using a peddling machine on off days of rehab. She reported that in rehab some of her workloads are starting to seem easier. Guidelines on properly increasing workloads was given.  Collie Siad is doing well in rehab.  She is now up to level 6 on the XR and is doing her home exercise.  She usually gets her one extra day at home.  And she well also walk when she shops.  She is planning to join the [employer] when she graduates.  Overall she is feeling better.  --  Collie Siad continues to do well. Staff will monitor progress.  Collie Siad attends the [facility] on days not at [facility] and rides the bike.   Expected Outcomes  Short: Increase workload on XR machine to level 3 next week. Long: Continue to gain strengh and stamina and increase MET levels.  Short: Continue to improve workloads. Long: Continue to exercise independently  --  Short - continue home exercise and attend program sessions Long - increase stamina  Short - continue to exercise consistently Long - improve MET level   Row Name 01/15/20 1546  01/19/20 1026           Exercise Goal Re-Evaluation   Exercise Goals Review  Increase Physical Activity;Increase Strength and Stamina;Able to understand and use rate of perceived exertion (RPE) scale;Able to understand and use Dyspnea scale;Knowledge and understanding of Target Heart Rate Range (THRR);Able to check pulse independently;Understanding of Exercise Prescription  Increase Physical Activity;Increase Strength and Stamina;Understanding of Exercise Prescription      Comments  Collie Siad attends the [employer] 2-3 days a week and uses a machine similar to the biostep; up to 5 miles (1 hour sessions). Pt reports doing seated exercises when watching TV sometimes. Encouraged pt to exercise most days of the week and include some resistance training.  Collie Siad is doing well in rehab.  She is now on level 5 for the NuStep!  We will continue to monitor her progress.      Expected Outcomes  Short - continue to exercise consistently Long - improve MET level  Short: Increase workload on BioStep.  Long: Continue to exercise on off days.         Nutrition & Weight - Outcomes: Pre Biometrics - 08/24/19 1040      Pre Biometrics   Height  5' 4.1" (1.628 m)    Weight  170 lb (77.1 kg)    BMI (Calculated)  29.09    Single Leg Stand  1.56 seconds      Post Biometrics - 01/29/20 1625       Post  Biometrics   Height  5' 4.1" (1.628 m)    Weight  165 lb 8 oz (75.1 kg)    BMI (Calculated)  28.32       Nutrition: Nutrition Therapy & Goals - 08/24/19 1034      Nutrition Therapy   Diet  Low Na, HH eating    Protein (specify units)  65g    Fiber  25 grams    Whole Grain Foods  3 servings    Saturated Fats  12 max.  grams    Fruits and Vegetables  5 servings/day    Sodium  1.5 grams      Personal Nutrition Goals   Nutrition Goal  ST: add more protein, make sure eating enough kcal LT: get more energy and strength    Comments  Pt reports eating raisin bread toast with some butter (WG) or instant oatmeal (pt  reports the lower sugar one) or yogurt (pt reports 60kcal fruit one). Dinner is usually chicken (breast or thigh and skin) with vegetables (occasionally regular pasta , but small amount). Discussed protein and kcal needs and that she doesn't seem to be getting enough especially not enough to support exercise and muscle building. Talked about maybe a snack before and after exercise (pt wants to do a kind bar before and greek yogurt after). Talked about healthy snacking and how to balance snacks (fat and prtoein especially). Pt reports will sometimes have a yogurt in the middle of the day and will add jello or pudding cup if she feels like she isn't getting enough. Discussed HH eating and weight loss vs healthy outcomes. Discussed that we will review energy and strength levels during program to ensure she is getting enough.      Intervention Plan   Intervention  Prescribe, educate and counsel regarding individualized specific dietary modifications aiming towards targeted core components such as weight, hypertension, lipid management, diabetes, heart failure and other comorbidities.;Nutrition handout(s) given to patient.    Expected Outcomes  Short Term Goal: Understand basic principles of dietary content, such as calories, fat, sodium, cholesterol and nutrients.;Short Term Goal: A plan has been developed with personal nutrition goals set during dietitian appointment.;Long Term Goal: Adherence to prescribed nutrition plan.       Nutrition Discharge: Nutrition Assessments - 08/24/19 1314      MEDFICTS Scores   Pre Score  15       Education Questionnaire Score: Knowledge Questionnaire Score - 01/29/20 1624      Knowledge Questionnaire Score   Post Score  24/26       Goals reviewed with patient; copy given to patient.

## 2020-01-29 NOTE — Progress Notes (Signed)
Cardiac Individual Treatment Plan  Patient Details  Name: Kelsey Carter MRN: [id number] Date of Birth: 06/29/1946 Referring Provider:     Cardiac Rehab from 08/24/2019 in [facility]  Referring Provider  Kowalski, Burce MD      Initial Encounter Date:    Cardiac Rehab from 08/24/2019 in [facility]  Date  08/24/19      Visit Diagnosis: Status post coronary artery stent placement  Patient's Home Medications on Admission:  Current Outpatient Medications:  .  alendronate (FOSAMAX) 70 MG tablet, Take 1 tablet (70 mg total) by mouth once a week. (Patient taking differently: Take 70 mg by mouth every Wednesday. ), Disp: 4 tablet, Rfl: 6 .  ALPRAZolam (XANAX) 1 MG tablet, TAKE 1 TABLET(1 MG) BY MOUTH AT BEDTIME AS NEEDED FOR ANXIETY (Patient taking differently: Take 1 mg by mouth at bedtime. ), Disp: 30 tablet, Rfl: 0 .  aspirin EC 81 MG tablet, Take 81 mg by mouth daily., Disp: , Rfl:  .  atorvastatin (LIPITOR) 40 MG tablet, Take 40 mg by mouth at bedtime., Disp: , Rfl:  .  Cholecalciferol (VITAMIN D) 2000 units CAPS, Take 1 capsule (2,000 Units total) by mouth daily. (Patient taking differently: Take 4,000 capsules by mouth daily. ), Disp: 30 capsule, Rfl:  .  clopidogrel (PLAVIX) 75 MG tablet, Take 1 tablet (75 mg total) by mouth daily., Disp: 90 tablet, Rfl: 2 .  MAGNESIUM PO, Take 1 tablet by mouth daily., Disp: , Rfl:  .  metoprolol succinate (TOPROL-XL) 50 MG 24 hr tablet, Take 50 mg by mouth daily. , Disp: , Rfl:  .  Omega-3 Fatty Acids (FISH OIL) 1000 MG CAPS, Take 1,000 mg by mouth daily., Disp: , Rfl:  .  telmisartan (MICARDIS) 80 MG tablet, Take 1 tablet (80 mg total) by mouth daily., Disp: 90 tablet, Rfl: 3 .  vitamin C (ASCORBIC ACID) 250 MG tablet, Take 500 mg by mouth daily., Disp: , Rfl:   Past Medical History: Past Medical History:  Diagnosis Date  . Anemia    vitamin d deficiency  . Anxiety   . Arthritis    back, DDD  .  Breast cancer (HCC)    bilateral mastectomies.  different types of cancer in each breast  . Cardiomyopathy due to chemotherapy (HCC) 2011  . Chronic pain 2019  . Complication of anesthesia   . Coronary artery disease   . GERD (gastroesophageal reflux disease)   . Hyperlipidemia   . Hypertension   . Lumbar herniated disc   . Peripheral vascular disease (HCC)    neuropathies in extremities due to chemotherapy  . Personal history of chemotherapy   . Personal history of radiation therapy   . PONV (postoperative nausea and vomiting)     Tobacco Use: Social History   Tobacco Use  Smoking Status Never Smoker  Smokeless Tobacco Never Used    Labs: Recent Review Flowsheet Data    Labs for ITP Cardiac and Pulmonary Rehab Latest Ref Rng & Units 04/24/2013 08/24/2017 03/22/2018 09/21/2018   Cholestrol 0 - 200 mg/dL 160 117 - 130   LDLCALC 0 - 99 mg/dL 87 48 - 59   HDL >40 mg/dL 54 53 - 49   Trlycerides <150 mg/dL 97 82 - 108   Hemoglobin A1c 4.8 - 5.6 % - - 5.9(H) 5.6       Exercise Target Goals: Exercise Program Goal: Individual exercise prescription set using results from initial 6 min walk test   and THRR while considering  patient's activity barriers and safety.   Exercise Prescription Goal: Initial exercise prescription builds to 30-45 minutes a day of aerobic activity, 2-3 days per week.  Home exercise guidelines will be given to patient during program as part of exercise prescription that the participant will acknowledge.  Activity Barriers & Risk Stratification: Activity Barriers & Cardiac Risk Stratification - 08/24/19 1034      Activity Barriers & Cardiac Risk Stratification   Activity Barriers  Other (comment);Balance Concerns;Back Problems;Muscular Weakness;Deconditioning;Shortness of Breath    Comments  neuropathy in hands and feet (post chemo), spinal stenosis    Cardiac Risk Stratification  Moderate       6 Minute Walk: 6 Minute Walk    Row Name 08/24/19 1033  01/29/20 1624       6 Minute Walk   Phase  Initial  Discharge    Distance  1170 feet  1340 feet    Distance % Change  --  14.5 %    Distance Feet Change  --  170 ft    Walk Time  6 minutes  6 minutes    # of Rest Breaks  0  0    MPH  2.22  2.53    METS  2.43  2.67    RPE  1  12    Perceived Dyspnea   1  0    VO2 Peak  8.54  9.35    Symptoms  Yes (comment)  No    Comments  SOB  --    Resting HR  70 bpm  65 bpm    Resting BP  112/56  126/64    Resting Oxygen Saturation   97 %  --    Exercise Oxygen Saturation  during 6 min walk  95 %  --    Max Ex. HR  105 bpm  89 bpm    Max Ex. BP  134/70  142/74    2 Minute Post BP  130/64  --       Oxygen Initial Assessment:   Oxygen Re-Evaluation:   Oxygen Discharge (Final Oxygen Re-Evaluation):   Initial Exercise Prescription: Initial Exercise Prescription - 08/24/19 1000      Date of Initial Exercise RX and Referring Provider   Date  08/24/19    Referring Provider  Kowalski, Burce MD      Treadmill   MPH  1.9    Grade  0    Minutes  15    METs  2.45      Elliptical   Level  2    Speed  1    Minutes  15      REL-XR   Level  1    Speed  50    Minutes  15    METs  2      T5 Nustep   Level  1    SPM  80    Minutes  15    METs  2      Prescription Details   Frequency (times per week)  2    Duration  Progress to 30 minutes of continuous aerobic without signs/symptoms of physical distress      Intensity   THRR 40-80% of Max Heartrate  101-132    Ratings of Perceived Exertion  11-13    Perceived Dyspnea  0-4      Progression   Progression  Continue to progress workloads to maintain intensity without signs/symptoms of physical distress.        Resistance Training   Training Prescription  Yes    Weight  3lbs    Reps  10-15       Perform Capillary Blood Glucose checks as needed.  Exercise Prescription Changes:  Exercise Prescription Changes    Row Name 08/24/19 1000 08/30/19 1100 10/11/19 1000 10/11/19  1300 10/25/19 1100     Response to Exercise   Blood Pressure (Admit)  112/56  124/84  --  118/60  110/64   Blood Pressure (Exercise)  134/70  146/72  --  138/72  122/84   Blood Pressure (Exit)  130/64  122/64  --  124/70  96/60   Heart Rate (Admit)  70 bpm  63 bpm  --  69 bpm  60 bpm   Heart Rate (Exercise)  105 bpm  131 bpm  --  105 bpm  96 bpm   Heart Rate (Exit)  74 bpm  85 bpm  --  75 bpm  66 bpm   Oxygen Saturation (Admit)  97 %  --  --  --  --   Oxygen Saturation (Exercise)  95 %  --  --  --  --   Rating of Perceived Exertion (Exercise)  11  14  --  12  13   Perceived Dyspnea (Exercise)  1  --  --  --  --   Symptoms  SOB  --  --  --  none   Comments  walk test results  first day  --  --  --   Duration  --  --  --  Progress to 30 minutes of  aerobic without signs/symptoms of physical distress  Continue with 30 min of aerobic exercise without signs/symptoms of physical distress.   Intensity  --  --  --  THRR unchanged  THRR unchanged     Progression   Progression  --  --  --  --  Continue to progress workloads to maintain intensity without signs/symptoms of physical distress.   Average METs  --  --  --  --  2.42     Resistance Training   Training Prescription  --  --  --  Yes  Yes   Weight  --  --  --   3 lb  3 lbs   Reps  --  --  --  10-15  10-15     Interval Training   Interval Training  --  --  --  --  No     Treadmill   MPH  --  --  --  1.9  1.9   Grade  --  --  --  0  0   Minutes  --  --  --  15  15   METs  --  --  --  2.45  2.45     REL-XR   Level  --  --  --  1  1   Speed  --  --  --  50  --   Minutes  --  --  --  15  15   METs  --  --  --  3  3     T5 Nustep   Level  --  --  --  --  1   Minutes  --  --  --  --  15   METs  --  --  --  --  1.8     Home Exercise Plan   Plans to continue exercise at  --  --  Home (  comment) walking staff videos  Home (comment) walking staff videos  Home (comment) walking staff videos   Frequency  --  --  Add 2 additional days  to program exercise sessions.  Add 2 additional days to program exercise sessions.  Add 2 additional days to program exercise sessions.   Initial Home Exercises Provided  --  --  10/11/19  10/11/19  10/11/19   Row Name 11/09/19 1500 11/22/19 1400 12/08/19 1200 12/21/19 1000 01/01/20 1400     Response to Exercise   Blood Pressure (Admit)  122/60  122/80  116/60  128/64  134/72   Blood Pressure (Exercise)  136/70  122/70  120/60  142/64  164/78   Blood Pressure (Exit)  122/62  124/64  92/60  100/60  120/64   Heart Rate (Admit)  83 bpm  78 bpm  79 bpm  70 bpm  84 bpm   Heart Rate (Exercise)  90 bpm  95 bpm  98 bpm  101 bpm  107 bpm   Heart Rate (Exit)  76 bpm  66 bpm  75 bpm  71 bpm  80 bpm   Rating of Perceived Exertion (Exercise)  _0 Symptoms  none  none  none  none  none   Duration  Continue with 30 min of aerobic exercise without signs/symptoms of physical distress.  Continue with 30 min of aerobic exercise without signs/symptoms of physical distress.  Continue with 30 min of aerobic exercise without signs/symptoms of physical distress.  Continue with 30 min of aerobic exercise without signs/symptoms of physical distress.  Continue with 30 min of aerobic exercise without signs/symptoms of physical distress.   Intensity  THRR unchanged  THRR unchanged  THRR unchanged  THRR unchanged  THRR unchanged     Progression   Progression  Continue to progress workloads to maintain intensity without signs/symptoms of physical distress.  Continue to progress workloads to maintain intensity without signs/symptoms of physical distress.  Continue to progress workloads to maintain intensity without signs/symptoms of physical distress.  Continue to progress workloads to maintain intensity without signs/symptoms of physical distress.  Continue to progress workloads to maintain intensity without signs/symptoms of physical distress.   Average METs  2.85  2.25  2.32  2.65  1.8     Resistance  Training   Training Prescription  Yes  Yes  Yes  Yes  Yes   Weight   3lb   3lb  3 lb  3 lb  3 lb   Reps  10-15  10-15  10-15  10-15  10-15     Interval Training   Interval Training  No  No  No  No  No     Arm Ergometer   Level  1  --  --  1  1   Minutes  15  --  --  15  15   METs  2.1  --  --  1.5  1.5     REL-XR   Level  _1 --  --   Speed  50  --  --  --  --   Minutes  _2 --  --   METs  3.9  2.8  3.1  --  --     T5 Nustep   Level  _3 --  --   Minutes  _4 --  --  METs  1.8  1.7  2  --  --     Biostep-RELP   Level  2  --  --  2  2   SPM  50  --  --  50  50   Minutes  15  --  --  15  15   METs  2  --  --  2  2     Home Exercise Plan   Plans to continue exercise at  Home (comment) walking staff videos  Home (comment) walking staff videos  Home (comment) walking staff videos  Home (comment) walking staff videos  Home (comment) walking staff videos   Frequency  Add 2 additional days to program exercise sessions.  Add 2 additional days to program exercise sessions.  Add 2 additional days to program exercise sessions.  Add 2 additional days to program exercise sessions.  Add 2 additional days to program exercise sessions.   Initial Home Exercises Provided  10/11/19  10/11/19  10/11/19  10/11/19  10/11/19   Row Name 01/19/20 1000             Response to Exercise   Blood Pressure (Admit)  122/70       Blood Pressure (Exercise)  138/68       Blood Pressure (Exit)  90/60       Heart Rate (Admit)  72 bpm       Heart Rate (Exercise)  93 bpm       Heart Rate (Exit)  71 bpm       Rating of Perceived Exertion (Exercise)  11       Symptoms  none       Duration  Continue with 30 min of aerobic exercise without signs/symptoms of physical distress.       Intensity  THRR unchanged         Progression   Progression  Continue to progress workloads to maintain intensity without signs/symptoms of physical distress.       Average METs  2.15          Resistance Training   Training Prescription  Yes       Weight  3 lb       Reps  10-15         Interval Training   Interval Training  No         NuStep   Level  5       Minutes  15       METs  2.1         Biostep-RELP   Level  2       Minutes  15       METs  2         Home Exercise Plan   Plans to continue exercise at  Home (comment) walking staff videos       Frequency  Add 2 additional days to program exercise sessions.       Initial Home Exercises Provided  10/11/19          Exercise Comments:  Exercise Comments    Row Name 08/30/19 1049 09/04/19 1056         Exercise Comments  First full day of exercise!  Patient was oriented to gym and equipment including functions, settings, policies, and procedures.  Patient's individual exercise prescription and treatment plan were reviewed.  All starting workloads were established based on the results of the 6 minute walk test done at initial orientation  visit.  The plan for exercise progression was also introduced and progression will be customized based on patient's performance and goals.  Kelsey Carter did not complete her rehab session.   After placing Kelsey Carter on the monitor- noted fast heartrate and irregular rhythm.  Looking at previous strips she was in NSR.  Called MD office and she has appointment to see PA/NP at 230. No exercise, Sent home to go to MD appt today. Advised if she feels any symptoms SOB, Chest pain/tightness to call 911.         Exercise Goals and Review:  Exercise Goals    Row Name 08/24/19 1039             Exercise Goals   Increase Physical Activity  Yes       Intervention  Provide advice, education, support and counseling about physical activity/exercise needs.;Develop an individualized exercise prescription for aerobic and resistive training based on initial evaluation findings, risk stratification, comorbidities and participant's personal goals.       Expected Outcomes  Short Term: Attend rehab on  a regular basis to increase amount of physical activity.;Long Term: Add in home exercise to make exercise part of routine and to increase amount of physical activity.;Long Term: Exercising regularly at least 3-5 days a week.       Increase Strength and Stamina  Yes       Intervention  Provide advice, education, support and counseling about physical activity/exercise needs.;Develop an individualized exercise prescription for aerobic and resistive training based on initial evaluation findings, risk stratification, comorbidities and participant's personal goals.       Expected Outcomes  Short Term: Increase workloads from initial exercise prescription for resistance, speed, and METs.;Short Term: Perform resistance training exercises routinely during rehab and add in resistance training at home;Long Term: Improve cardiorespiratory fitness, muscular endurance and strength as measured by increased METs and functional capacity (6MWT)       Able to understand and use rate of perceived exertion (RPE) scale  Yes       Intervention  Provide education and explanation on how to use RPE scale       Expected Outcomes  Short Term: Able to use RPE daily in rehab to express subjective intensity level;Long Term:  Able to use RPE to guide intensity level when exercising independently       Able to understand and use Dyspnea scale  Yes       Intervention  Provide education and explanation on how to use Dyspnea scale       Expected Outcomes  Short Term: Able to use Dyspnea scale daily in rehab to express subjective sense of shortness of breath during exertion;Long Term: Able to use Dyspnea scale to guide intensity level when exercising independently       Knowledge and understanding of Target Heart Rate Range (THRR)  Yes       Intervention  Provide education and explanation of THRR including how the numbers were predicted and where they are located for reference       Expected Outcomes  Short Term: Able to state/look up  THRR;Short Term: Able to use daily as guideline for intensity in rehab;Long Term: Able to use THRR to govern intensity when exercising independently       Able to check pulse independently  Yes       Intervention  Provide education and demonstration on how to check pulse in carotid and radial arteries.;Review the importance of being able to check  your own pulse for safety during independent exercise       Expected Outcomes  Short Term: Able to explain why pulse checking is important during independent exercise;Long Term: Able to check pulse independently and accurately       Understanding of Exercise Prescription  Yes       Intervention  Provide education, explanation, and written materials on patient's individual exercise prescription       Expected Outcomes  Short Term: Able to explain program exercise prescription;Long Term: Able to explain home exercise prescription to exercise independently          Exercise Goals Re-Evaluation : Exercise Goals Re-Evaluation    Row Name 08/30/19 1049 10/03/19 1001 10/11/19 1055 10/25/19 1151 11/09/19 1541     Exercise Goal Re-Evaluation   Exercise Goals Review  Increase Strength and Stamina;Increase Physical Activity;Able to understand and use rate of perceived exertion (RPE) scale;Knowledge and understanding of Target Heart Rate Range (THRR);Understanding of Exercise Prescription  --  Increase Strength and Stamina;Increase Physical Activity;Able to understand and use rate of perceived exertion (RPE) scale;Knowledge and understanding of Target Heart Rate Range (THRR);Understanding of Exercise Prescription;Able to check pulse independently  Increase Physical Activity;Increase Strength and Stamina;Understanding of Exercise Prescription  Increase Physical Activity;Increase Strength and Stamina;Able to understand and use rate of perceived exertion (RPE) scale;Knowledge and understanding of Target Heart Rate Range (THRR);Able to check pulse independently;Understanding  of Exercise Prescription   Comments  Reviewed RPE scale, THR and program prescription with pt today.  Pt voiced understanding and was given a copy of goals to take home.  out since last review  Reviewed home exercise with pt today.  Pt plans to walking at home and staff videos for exercise.  Reviewed THR, pulse, RPE, sign and symptoms, and when to call 911 or MD.  Also discussed weather considerations and indoor options.  Pt voiced understanding.  Kelsey Carter has returned to exericse.  She only had three visits during the month of October.  She was encouraged to attend regularly again.  We will continue to monitor her progress.  Kelsey Carter has been attending more regularly in November.  She is working in RPE 11-15.   Expected Outcomes  Short: Use RPE daily to regulate intensity. Long: Follow program prescription in THR.  --  Short: Start to walk at home.  Long: Exercise independently  Short: Attend rehab regularly.  Long: Continue to rebuild stamina.  Short - continue to attend consistently Long - improve overall MET level   Row Name 11/20/19 1026 12/04/19 1031 12/08/19 1204 12/21/19 1030 01/01/20 1524     Exercise Goal Re-Evaluation   Exercise Goals Review  Increase Physical Activity;Increase Strength and Stamina;Able to understand and use rate of perceived exertion (RPE) scale;Knowledge and understanding of Target Heart Rate Range (THRR);Able to check pulse independently;Understanding of Exercise Prescription  Increase Physical Activity;Increase Strength and Stamina;Able to understand and use rate of perceived exertion (RPE) scale;Knowledge and understanding of Target Heart Rate Range (THRR);Able to check pulse independently;Understanding of Exercise Prescription  Increase Physical Activity;Increase Strength and Stamina;Able to understand and use rate of perceived exertion (RPE) scale;Able to understand and use Dyspnea scale;Knowledge and understanding of Target Heart Rate Range (THRR);Able to check pulse  independently;Understanding of Exercise Prescription  Increase Physical Activity;Increase Strength and Stamina;Able to understand and use rate of perceived exertion (RPE) scale;Knowledge and understanding of Target Heart Rate Range (THRR);Able to check pulse independently;Understanding of Exercise Prescription  Increase Physical Activity;Increase Strength and Stamina;Able to understand and use rate   of perceived exertion (RPE) scale;Able to understand and use Dyspnea scale;Knowledge and understanding of Target Heart Rate Range (THRR);Able to check pulse independently;Understanding of Exercise Prescription   Comments  Kelsey Carter reports that she is using a peddling machine on off days of rehab. She reported that in rehab some of her workloads are starting to seem easier. Guidelines on properly increasing workloads was given.  Kelsey Carter is doing well in rehab.  She is now up to level 6 on the XR and is doing her home exercise.  She usually gets her one extra day at home.  And she well also walk when she shops.  She is planning to join the [employer] when she graduates.  Overall she is feeling better.  --  Kelsey Carter continues to do well. Staff will monitor progress.  Kelsey Carter attends the [employer] on days not at [facility] and rides the bike.   Expected Outcomes  Short: Increase workload on XR machine to level 3 next week. Long: Continue to gain strengh and stamina and increase MET levels.  Short: Continue to improve workloads. Long: Continue to exercise independently  --  Short - continue home exercise and attend program sessions Long - increase stamina  Short - continue to exercise consistently Long - improve MET level   Row Name 01/15/20 1546 01/19/20 1026           Exercise Goal Re-Evaluation   Exercise Goals Review  Increase Physical Activity;Increase Strength and Stamina;Able to understand and use rate of perceived exertion (RPE) scale;Able to understand and use Dyspnea scale;Knowledge and understanding of Target Heart Rate Range (THRR);Able to  check pulse independently;Understanding of Exercise Prescription  Increase Physical Activity;Increase Strength and Stamina;Understanding of Exercise Prescription      Comments  Kelsey Carter attends the [employer] 2-3 days a week and uses a machine similar to the biostep; up to 5 miles (1 hour sessions). Pt reports doing seated exercises when watching TV sometimes. Encouraged pt to exercise most days of the week and include some resistance training.  Kelsey Carter is doing well in rehab.  She is now on level 5 for the NuStep!  We will continue to monitor her progress.      Expected Outcomes  Short - continue to exercise consistently Long - improve MET level  Short: Increase workload on BioStep.  Long: Continue to exercise on off days.         Discharge Exercise Prescription (Final Exercise Prescription Changes): Exercise Prescription Changes - 01/19/20 1000      Response to Exercise   Blood Pressure (Admit)  122/70    Blood Pressure (Exercise)  138/68    Blood Pressure (Exit)  90/60    Heart Rate (Admit)  72 bpm    Heart Rate (Exercise)  93 bpm    Heart Rate (Exit)  71 bpm    Rating of Perceived Exertion (Exercise)  11    Symptoms  none    Duration  Continue with 30 min of aerobic exercise without signs/symptoms of physical distress.    Intensity  THRR unchanged      Progression   Progression  Continue to progress workloads to maintain intensity without signs/symptoms of physical distress.    Average METs  2.15      Resistance Training   Training Prescription  Yes    Weight  3 lb    Reps  10-15      Interval Training   Interval Training  No      NuStep   Level    5    Minutes  15    METs  2.1      Biostep-RELP   Level  2    Minutes  15    METs  2      Home Exercise Plan   Plans to continue exercise at  Home (comment)   walking staff videos   Frequency  Add 2 additional days to program exercise sessions.    Initial Home Exercises Provided  10/11/19       Nutrition:  Target Goals: Understanding  of nutrition guidelines, daily intake of sodium <1560m, cholesterol <204m calories 30% from fat and 7% or less from saturated fats, daily to have 5 or more servings of fruits and vegetables.  Biometrics: Pre Biometrics - 08/24/19 1040      Pre Biometrics   Height  5' 4.1" (1.628 m)    Weight  170 lb (77.1 kg)    BMI (Calculated)  29.09    Single Leg Stand  1.56 seconds      Post Biometrics - 01/29/20 1625       Post  Biometrics   Height  5' 4.1" (1.628 m)    Weight  165 lb 8 oz (75.1 kg)    BMI (Calculated)  28.32       Nutrition Therapy Plan and Nutrition Goals: Nutrition Therapy & Goals - 08/24/19 1034      Nutrition Therapy   Diet  Low Na, HH eating    Protein (specify units)  65g    Fiber  25 grams    Whole Grain Foods  3 servings    Saturated Fats  12 max. grams    Fruits and Vegetables  5 servings/day    Sodium  1.5 grams      Personal Nutrition Goals   Nutrition Goal  ST: add more protein, make sure eating enough kcal LT: get more energy and strength    Comments  Pt reports eating raisin bread toast with some butter (WG) or instant oatmeal (pt reports the lower sugar one) or yogurt (pt reports 60kcal fruit one). Dinner is usually chicken (breast or thigh and skin) with vegetables (occasionally regular pasta , but small amount). Discussed protein and kcal needs and that she doesn't seem to be getting enough especially not enough to support exercise and muscle building. Talked about maybe a snack before and after exercise (pt wants to do a kind bar before and greek yogurt after). Talked about healthy snacking and how to balance snacks (fat and prtoein especially). Pt reports will sometimes have a yogurt in the middle of the day and will add jello or pudding cup if she feels like she isn't getting enough. Discussed HH eating and weight loss vs healthy outcomes. Discussed that we will review energy and strength levels during program to ensure she is getting enough.       Intervention Plan   Intervention  Prescribe, educate and counsel regarding individualized specific dietary modifications aiming towards targeted core components such as weight, hypertension, lipid management, diabetes, heart failure and other comorbidities.;Nutrition handout(s) given to patient.    Expected Outcomes  Short Term Goal: Understand basic principles of dietary content, such as calories, fat, sodium, cholesterol and nutrients.;Short Term Goal: A plan has been developed with personal nutrition goals set during dietitian appointment.;Long Term Goal: Adherence to prescribed nutrition plan.       Nutrition Assessments: Nutrition Assessments - 08/24/19 1314      MEDFICTS Scores   Pre Score  15  Nutrition Goals Re-Evaluation: Nutrition Goals Re-Evaluation    Row Name 10/09/19 1050 10/30/19 1016 12/12/19 0828 01/15/20 1544       Goals   Current Weight  168 lb 4.8 oz (76.3 kg)  164 lb (74.4 kg)  --  --    Nutrition Goal  ST: add more protein, make sure eating enough kcal LT: get more energy and strength  ST: add more protein, make sure eating enough kcal LT: get more energy and strength, get off medicince  ST: add more protein, make sure eating enough kcal LT: get more energy and strength, get off medicince  LT: get more energy and strength, get off medicince    Comment  Pt reports doing well, strength and energy is good, but pt reports having some heart problems she is working with her doctor to resolve. Pt reports adding a protein bar in between meals. Pt reports only hungry enough for two meals, discussed if energy and/or strength begins to deminish or not continue to imporve or if her weight falls too rapidly that we would have to implement high kcal and protein MNT as well as mechanical eating.  Pt reports now 162lbs from her scale - goal is 155lbs. Energy is ok, pt is doing more around the house. Pt still continuing to add protein with protein bars. pt reports strength is better  than when we last spoke.  Continue current changes  Continue current changes; strength is good and energy is stable. Pt continues to lose weight. Pt would not like to make any further nutrition goals at this time.    Expected Outcome  ST: add more protein, make sure eating enough kcal LT: get more energy and strength  ST: add more protein, make sure eating enough kcal LT: get more energy and strength  ST: add more protein, make sure eating enough kcal LT: get more energy and strength  LT: get more energy and strength, get off medicince       Nutrition Goals Discharge (Final Nutrition Goals Re-Evaluation): Nutrition Goals Re-Evaluation - 01/15/20 1544      Goals   Nutrition Goal  LT: get more energy and strength, get off medicince    Comment  Continue current changes; strength is good and energy is stable. Pt continues to lose weight. Pt would not like to make any further nutrition goals at this time.    Expected Outcome  LT: get more energy and strength, get off medicince       Psychosocial: Target Goals: Acknowledge presence or absence of significant depression and/or stress, maximize coping skills, provide positive support system. Participant is able to verbalize types and ability to use techniques and skills needed for reducing stress and depression.   Initial Review & Psychosocial Screening: Initial Psych Review & Screening - 08/23/19 1134      Initial Review   Current issues with  Current Sleep Concerns   sleepy throughout the day     Family Dynamics   Good Support System?  Yes      Barriers   Psychosocial barriers to participate in program  There are no identifiable barriers or psychosocial needs.;The patient should benefit from training in stress management and relaxation.      Screening Interventions   Interventions  Encouraged to exercise;To provide support and resources with identified psychosocial needs;Provide feedback about the scores to participant    Expected Outcomes   Short Term goal: Utilizing psychosocial counselor, staff and physician to assist with identification of specific Stressors   or current issues interfering with healing process. Setting desired goal for each stressor or current issue identified.;Long Term Goal: Stressors or current issues are controlled or eliminated.;Short Term goal: Identification and review with participant of any Quality of Life or Depression concerns found by scoring the questionnaire.;Long Term goal: The participant improves quality of Life and PHQ9 Scores as seen by post scores and/or verbalization of changes       Quality of Life Scores:  Quality of Life - 01/29/20 1622      Quality of Life Scores   Health/Function Post  27.4 %    Socioeconomic Post  28.17 %    Psych/Spiritual Post  28.29 %    Family Post  27.6 %    GLOBAL Post  27.76 %      Scores of 19 and below usually indicate a poorer quality of life in these areas.  A difference of  2-3 points is a clinically meaningful difference.  A difference of 2-3 points in the total score of the Quality of Life Index has been associated with significant improvement in overall quality of life, self-image, physical symptoms, and general health in studies assessing change in quality of life.  PHQ-9: Recent Review Flowsheet Data    Depression screen [facility] 2/9 01/29/2020 08/24/2019 09/21/2018 03/31/2018 03/03/2018   Decreased Interest 0 0 0 0 0   Down, Depressed, Hopeless 0 0 0 0 0   PHQ - 2 Score 0 0 0 0 0   Altered sleeping 0 0 0 0 -   Tired, decreased energy 0 1 0 0 -   Change in appetite 0 0 0 0 -   Feeling bad or failure about yourself  0 0 0 0 -   Trouble concentrating 0 0 0 0 -   Moving slowly or fidgety/restless 0 0 0 0 -   Suicidal thoughts 0 0 0 0 -   PHQ-9 Score 0 1 0 0 -   Difficult doing work/chores - Not difficult at all - Not difficult at all -     Interpretation of Total Score  Total Score Depression Severity:  1-4 = Minimal depression, 5-9 = Mild depression,  10-14 = Moderate depression, 15-19 = Moderately severe depression, 20-27 = Severe depression   Psychosocial Evaluation and Intervention:   Psychosocial Re-Evaluation: Psychosocial Re-Evaluation    Row Name 10/11/19 1034 11/20/19 1028 12/04/19 1034 01/15/20 1543       Psychosocial Re-Evaluation   Current issues with  Current Sleep Concerns  None Identified  None Identified  None Identified    Comments  Kelsey Carter says that she has changed primary doctors because she has neuropathy and her old doctor cut her medicine in half. She has to see her doctor yet to get her prescriptions in order. Her sleep has been ok but could be better. She takes a lot of medicine and has to take thinners and she is coming off of some of them when her doctor tells her to.  Kelsey Carter reports no new stress concerns,  sleeping well, and having a strong support system. She is still working with her doctors on the amount of blood thinners she is taking and may come off of one at her next appointment.  Kelsey Carter is doing well in rehab.  She is pleased with her progress but still wants to come off her blood thinners.  She is bored at home, but managing.  She continues to sleep well.  Kelsey Carter is doing well in rehab.  She is  bored at home, but managing.  She continues to sleep well. Pt reports her support system is good.    Expected Outcomes  Short: attend class to reduce stress and improve sleep. Long: maintain medications to help sleep.  Short: attend class to reduce stress. Long: maintain good mental health with healthy habbits.  Short: Continue to work towards graduation.  Long: Continue to stay positive.  Short: Continue to work towards graduation.  Long: Continue to stay positive.    Interventions  Encouraged to attend Cardiac Rehabilitation for the exercise  --  Encouraged to attend Cardiac Rehabilitation for the exercise  Encouraged to attend Cardiac Rehabilitation for the exercise    Continue Psychosocial Services   Follow up required by staff   Follow up required by staff  --  Follow up required by staff       Psychosocial Discharge (Final Psychosocial Re-Evaluation): Psychosocial Re-Evaluation - 01/15/20 1543      Psychosocial Re-Evaluation   Current issues with  None Identified    Comments  Kelsey Carter is doing well in rehab.  She is bored at home, but managing.  She continues to sleep well. Pt reports her support system is good.    Expected Outcomes  Short: Continue to work towards graduation.  Long: Continue to stay positive.    Interventions  Encouraged to attend Cardiac Rehabilitation for the exercise    Continue Psychosocial Services   Follow up required by staff       Vocational Rehabilitation: Provide vocational rehab assistance to qualifying candidates.   Vocational Rehab Evaluation & Intervention: Vocational Rehab - 08/23/19 1136      Initial Vocational Rehab Evaluation & Intervention   Assessment shows need for Vocational Rehabilitation  No       Education: Education Goals: Education classes will be provided on a variety of topics geared toward better understanding of heart health and risk factor modification. Participant will state understanding/return demonstration of topics presented as noted by education test scores.  Learning Barriers/Preferences: Learning Barriers/Preferences - 08/23/19 1136      Learning Barriers/Preferences   Learning Barriers  None    Learning Preferences  None       Education Topics:  AED/CPR: - Group verbal and written instruction with the use of models to demonstrate the basic use of the AED with the basic ABC's of resuscitation.   General Nutrition Guidelines/Fats and Fiber: -Group instruction provided by verbal, written material, models and posters to present the general guidelines for heart healthy nutrition. Gives an explanation and review of dietary fats and fiber.   Controlling Sodium/Reading Food Labels: -Group verbal and written material supporting the discussion of  sodium use in heart healthy nutrition. Review and explanation with models, verbal and written materials for utilization of the food label.   Exercise Physiology & General Exercise Guidelines: - Group verbal and written instruction with models to review the exercise physiology of the cardiovascular system and associated critical values. Provides general exercise guidelines with specific guidelines to those with heart or lung disease.    Aerobic Exercise & Resistance Training: - Gives group verbal and written instruction on the various components of exercise. Focuses on aerobic and resistive training programs and the benefits of this training and how to safely progress through these programs..   Cardiac Rehab from 10/11/2019 in [facility] Cardiac and Pulmonary Rehab  Date  10/11/19  Educator  AS  Instruction Review Code  1- Verbalizes Understanding      Flexibility, Balance, Mind/Body Relaxation: Provides group  verbal/written instruction on the benefits of flexibility and balance training, including mind/body exercise modes such as yoga, pilates and tai chi.  Demonstration and skill practice provided.   Stress and Anxiety: - Provides group verbal and written instruction about the health risks of elevated stress and causes of high stress.  Discuss the correlation between heart/lung disease and anxiety and treatment options. Review healthy ways to manage with stress and anxiety.   Depression: - Provides group verbal and written instruction on the correlation between heart/lung disease and depressed mood, treatment options, and the stigmas associated with seeking treatment.   Anatomy & Physiology of the Heart: - Group verbal and written instruction and models provide basic cardiac anatomy and physiology, with the coronary electrical and arterial systems. Review of Valvular disease and Heart Failure   Cardiac Procedures: - Group verbal and written instruction to review commonly prescribed  medications for heart disease. Reviews the medication, class of the drug, and side effects. Includes the steps to properly store meds and maintain the prescription regimen. (beta blockers and nitrates)   Cardiac Medications I: - Group verbal and written instruction to review commonly prescribed medications for heart disease. Reviews the medication, class of the drug, and side effects. Includes the steps to properly store meds and maintain the prescription regimen.   Cardiac Medications II: -Group verbal and written instruction to review commonly prescribed medications for heart disease. Reviews the medication, class of the drug, and side effects. (all other drug classes)   Cardiac Rehab from 10/11/2019 in [facility] Cardiac and Pulmonary Rehab  Date  10/11/19  Educator  SB  Instruction Review Code  1- Verbalizes Understanding       Go Sex-Intimacy & Heart Disease, Get SMART - Goal Setting: - Group verbal and written instruction through game format to discuss heart disease and the return to sexual intimacy. Provides group verbal and written material to discuss and apply goal setting through the application of the S.M.A.R.T. Method.   Other Matters of the Heart: - Provides group verbal, written materials and models to describe Stable Angina and Peripheral Artery. Includes description of the disease process and treatment options available to the cardiac patient.   Exercise & Equipment Safety: - Individual verbal instruction and demonstration of equipment use and safety with use of the equipment.   Cardiac Rehab from 10/11/2019 in [facility] Cardiac and Pulmonary Rehab  Date  08/24/19  Educator  JH  Instruction Review Code  1- Verbalizes Understanding      Infection Prevention: - Provides verbal and written material to individual with discussion of infection control including proper hand washing and proper equipment cleaning during exercise session.   Cardiac Rehab from 10/11/2019 in [facility] Cardiac  and Pulmonary Rehab  Date  08/24/19  Educator  JH  Instruction Review Code  1- Verbalizes Understanding      Falls Prevention: - Provides verbal and written material to individual with discussion of falls prevention and safety.   Cardiac Rehab from 10/11/2019 in [facility] Cardiac and Pulmonary Rehab  Date  08/24/19  Educator  JH  Instruction Review Code  1- Verbalizes Understanding      Diabetes: - Individual verbal and written instruction to review signs/symptoms of diabetes, desired ranges of glucose level fasting, after meals and with exercise. Acknowledge that pre and post exercise glucose checks will be done for 3 sessions at entry of program.   Know Your Numbers and Risk Factors: -Group verbal and written instruction about important numbers in your health.  Discussion of   what are risk factors and how they play a role in the disease process.  Review of Cholesterol, Blood Pressure, Diabetes, and BMI and the role they play in your overall health.   Cardiac Rehab from 10/11/2019 in [facility] Cardiac and Pulmonary Rehab  Date  10/11/19  Educator  SB  Instruction Review Code  1- Verbalizes Understanding      Sleep Hygiene: -Provides group verbal and written instruction about how sleep can affect your health.  Define sleep hygiene, discuss sleep cycles and impact of sleep habits. Review good sleep hygiene tips.    Other: -Provides group and verbal instruction on various topics (see comments)   Knowledge Questionnaire Score: Knowledge Questionnaire Score - 01/29/20 1624      Knowledge Questionnaire Score   Post Score  24/26       Core Components/Risk Factors/Patient Goals at Admission: Personal Goals and Risk Factors at Admission - 08/24/19 1041      Core Components/Risk Factors/Patient Goals on Admission    Weight Management  Yes;Weight Loss    Intervention  Weight Management: Develop a combined nutrition and exercise program designed to reach desired caloric intake, while  maintaining appropriate intake of nutrient and fiber, sodium and fats, and appropriate energy expenditure required for the weight goal.;Weight Management: Provide education and appropriate resources to help participant work on and attain dietary goals.;Weight Management/Obesity: Establish reasonable short term and long term weight goals.    Admit Weight  170 lb (77.1 kg)    Goal Weight: Short Term  165 lb (74.8 kg)    Goal Weight: Long Term  160 lb (72.6 kg)    Expected Outcomes  Short Term: Continue to assess and modify interventions until short term weight is achieved;Long Term: Adherence to nutrition and physical activity/exercise program aimed toward attainment of established weight goal;Weight Loss: Understanding of general recommendations for a balanced deficit meal plan, which promotes 1-2 lb weight loss per week and includes a negative energy balance of [id number] kcal/d;Understanding recommendations for meals to include 15-35% energy as protein, 25-35% energy from fat, 35-60% energy from carbohydrates, less than 266m of dietary cholesterol, 20-35 gm of total fiber daily;Understanding of distribution of calorie intake throughout the day with the consumption of 4-5 meals/snacks    Improve shortness of breath with ADL's  Yes    Intervention  Provide education, individualized exercise plan and daily activity instruction to help decrease symptoms of SOB with activities of daily living.    Expected Outcomes  Short Term: Improve cardiorespiratory fitness to achieve a reduction of symptoms when performing ADLs;Long Term: Be able to perform more ADLs without symptoms or delay the onset of symptoms    Hypertension  Yes    Intervention  Provide education on lifestyle modifcations including regular physical activity/exercise, weight management, moderate sodium restriction and increased consumption of fresh fruit, vegetables, and low fat dairy, alcohol moderation, and smoking cessation.;Monitor prescription use  compliance.    Expected Outcomes  Short Term: Continued assessment and intervention until BP is < 140/914mHG in hypertensive participants. < 130/8047mG in hypertensive participants with diabetes, heart failure or chronic kidney disease.;Long Term: Maintenance of blood pressure at goal levels.    Lipids  Yes    Intervention  Provide education and support for participant on nutrition & aerobic/resistive exercise along with prescribed medications to achieve LDL <29m22mDL >40mg73m Expected Outcomes  Short Term: Participant states understanding of desired cholesterol values and is compliant with medications prescribed. Participant is following exercise prescription  and nutrition guidelines.;Long Term: Cholesterol controlled with medications as prescribed, with individualized exercise RX and with personalized nutrition plan. Value goals: LDL < 11m, HDL > 40 mg.       Core Components/Risk Factors/Patient Goals Review:  Goals and Risk Factor Review    Row Name 10/11/19 1037 11/20/19 1040 12/04/19 1032 01/15/20 1541       Core Components/Risk Factors/Patient Goals Review   Personal Goals Review  Weight Management/Obesity;Improve shortness of breath with ADL's;Lipids;Hypertension  Weight Management/Obesity;Improve shortness of breath with ADL's;Lipids;Hypertension  Weight Management/Obesity;Improve shortness of breath with ADL's;Lipids;Hypertension  Weight Management/Obesity;Improve shortness of breath with ADL's;Lipids;Hypertension    Review  Kelsey Siadwants to lose weight and so far she has lost about three pounds. She is taking her blood pressure medications everyday and her statin. She wants to achieve a weight of 155 pounds. She is eating healthier and is ready to be lighter. Her shortness of breath is ok when she is cleaning and she watches when she gets short of breath.  Kelsey Siadreports that she is still losing weight and has met the goal of losing 5 lbs in 2 weeks. She plans to continue her exercise and  eating habits to eventually reach goal weight of 155 lbs. Kelsey Siadreports her shortness of breath has impoved and she only gets short of breath when walking up a hill, but otherwise can accomplish most of her daily routine. Kelsey Siadreports taking all meds to control hypertension and lipies.  Kelsey Siadcontinues to do well in rehab.  Her weight continues to go down and this makes her happy.  Her blood pressures continue to do well.  Her SOB has greatly improved andshe feels better with all that she is doing.  Pt reports going to echo and stress test tomorrow 1/26. Pt reports her blood pressure and SOB is doing well, pt reports no trouble with ADL with SOB. [facility]; Up to 5 miles on biostep (1 hour sessions).    Expected Outcomes  Short: lose 5 pounds in two weeks. Long: Weigh 155 pounds.  Short: continue to lose 1-2 pounds a week. Long: weigh 155 lbs.  Short: Continue to work on weight loss.  Long: Continue to monitor risk factors.  Short: Continue to work on weight loss.  Long: Continue to monitor risk factors.       Core Components/Risk Factors/Patient Goals at Discharge (Final Review):  Goals and Risk Factor Review - 01/15/20 1541      Core Components/Risk Factors/Patient Goals Review   Personal Goals Review  Weight Management/Obesity;Improve shortness of breath with ADL's;Lipids;Hypertension    Review  Pt reports going to echo and stress test tomorrow 1/26. Pt reports her blood pressure and SOB is doing well, pt reports no trouble with ADL with SOB. [facility]; Up to 5 miles on biostep (1 hour sessions).    Expected Outcomes  Short: Continue to work on weight loss.  Long: Continue to monitor risk factors.       ITP Comments: ITP Comments    Row Name 08/23/19 1138 08/24/19 1032 08/30/19 0546 09/04/19 1056 09/12/19 1109   ITP Comments  Virtual initial orientation completed. [facility] EP/RD Orientation scheduled 9/3 at 9:30   Completed 6MWT, gym orientation, and RD evaluation. Initial ITP created and sent for review to Dr. MEmily Filbert Medical Director.  30 Day review. Continue with ITP unless directed changes per Medical Director review.  New to program attended orientation  Kelsey Carter did not complete her rehab session.   After placing Kelsey Carter on the monitor- noted fast heartrate and irregular rhythm.  Looking at previous strips she was in NSR.  Called MD office and she has appointment to see PA/NP at 230. No exercise, Sent home to go to MD appt today. Advised if she feels any symptoms SOB, Chest pain/tightness to call 911.  Called check up on pt since we sent her to doctor for afib.  She has started some new medications.  She has a follow up appt on Friday and will ask about returning to rehab then.   Row Name 09/27/19 1117 10/03/19 1000 10/25/19 0613 11/22/19 0926 12/20/19 0830   ITP Comments  30 day review completed. ITP sent to Dr. Emily Filbert, Medical Director of Cardiac and Pulmonary Rehab. Continue with ITP unless changes are made by physician.  Department closed starting 10/2 until further notice by infection prevention and Health at Work teams for [place].  Called to check on pt.  She has been out since 9/9.  She was cleared to return to rehab on 10/5 and will be back next Monday.  30 day review completed. Continue with ITP sent to Dr. Emily Filbert, Medical Director of Cardiac and Pulmonary Rehab for review , changes as needed and signature.  30 day review competed . ITP sent to Dr Emily Filbert for review, changes as needed and ITP approval signature.  30 day review competed . ITP sent to Dr Emily Filbert for review, changes as needed and ITP approval signature   [place] Name 12/25/19 1047 01/08/20 1655 01/17/20 0945 01/22/20 1119     ITP Comments  Kelsey Carter stated that she has joined the [facility] to be able to exercise on off days of rehab. She is doing the cardio equipment and stretching. She reports going 2 times per week. Kelsey Carter  stated that she will be calling her Cardiologist about an issue she is having with her blood thinners ( Bruising and blood blisters).  Talked to Kelsey Carter to check in as this is her off week from inpatient class. She is doing well, trying to stay active given the cold weather. She is continuing to take her medication as prescribed and reports her BP feeling fine. She is planning on coming to class this coming Monday.  30 day review completed. ITP sent to Dr. Emily Filbert, Medical Director of Cardiac and Pulmonary Rehab. Continue with ITP unless changes are made by physician.  Department operating under reduced schedule until further notice by request from hospital leadership.  Followed up with Kelsey Carter during her off week from Cardiac Rehab. She reported doing well and feeling fine. [facility]       Comments: 20

## 2020-01-29 NOTE — Progress Notes (Signed)
Daily Session Note  Patient Details  Name: Kelsey Carter MRN: [id number] Date of Birth: 10/11/1946 Referring Provider:     Cardiac Rehab from 08/24/2019 in [facility]  Referring Provider  Kowalski, Burce MD      Encounter Date: 01/29/2020  Check In: Session Check In - 01/29/20 1552      Check-In   Supervising physician immediately available to respond to emergencies  See telemetry face sheet for immediately available ER MD    Location  [facility]    Staff Present  Kelly Hayes, BS, ACSM CEP, Exercise Physiologist;Meredith Craven, RN BSN;Joseph Hood RCP,RRT,BSRT    Virtual Visit  No    Medication changes reported      No    Fall or balance concerns reported     No    Warm-up and Cool-down  Performed on first and last piece of equipment    Resistance Training Performed  Yes    VAD Patient?  No      Pain Assessment   Currently in Pain?  No/denies          Social History   Tobacco Use  Smoking Status Never Smoker  Smokeless Tobacco Never Used    Goals Met:  Independence with exercise equipment Exercise tolerated well No report of cardiac concerns or symptoms Strength training completed today  Goals Unmet:  Not Applicable  Comments:  Imojean graduated today from  rehab with 20 sessions completed.  Details of the patient's exercise prescription and what She needs to do in order to continue the prescription and progress were discussed with patient.  Patient was given a copy of prescription and goals.  Patient verbalized understanding.  Donnie plans to continue to exercise by exercising at the [employer].    Dr. Mark Miller is Medical Director for [facility] and [facility]. 

## 2020-01-29 NOTE — Patient Instructions (Addendum)
Discharge Patient Instructions  Patient Details  Name: Kelsey Carter MRN: [id number] Date of Birth: 1946/05/27 Referring Provider:  Corey Skains, MD   Number of Visits: 20  Reason for Discharge:  Patient independent in their exercise.  Smoking History:  Social History   Tobacco Use  Smoking Status Never Smoker  Smokeless Tobacco Never Used    Diagnosis:  Status post coronary artery stent placement  Initial Exercise Prescription: Initial Exercise Prescription - 08/24/19 1000      Date of Initial Exercise RX and Referring Provider   Date  08/24/19    Referring Provider  Josephina Gip MD      Treadmill   MPH  1.9    Grade  0    Minutes  15    METs  2.45      Elliptical   Level  2    Speed  1    Minutes  15      REL-XR   Level  1    Speed  50    Minutes  15    METs  2      T5 Nustep   Level  1    SPM  80    Minutes  15    METs  2      Prescription Details   Frequency (times per week)  2    Duration  Progress to 30 minutes of continuous aerobic without signs/symptoms of physical distress      Intensity   THRR 40-80% of Max Heartrate  101-132    Ratings of Perceived Exertion  11-13    Perceived Dyspnea  0-4      Progression   Progression  Continue to progress workloads to maintain intensity without signs/symptoms of physical distress.      Resistance Training   Training Prescription  Yes    Weight  3lbs    Reps  10-15       Discharge Exercise Prescription (Final Exercise Prescription Changes): Exercise Prescription Changes - 01/19/20 1000      Response to Exercise   Blood Pressure (Admit)  122/70    Blood Pressure (Exercise)  138/68    Blood Pressure (Exit)  90/60    Heart Rate (Admit)  72 bpm    Heart Rate (Exercise)  93 bpm    Heart Rate (Exit)  71 bpm    Rating of Perceived Exertion (Exercise)  11    Symptoms  none    Duration  Continue with 30 min of aerobic exercise without signs/symptoms of physical distress.     Intensity  THRR unchanged      Progression   Progression  Continue to progress workloads to maintain intensity without signs/symptoms of physical distress.    Average METs  2.15      Resistance Training   Training Prescription  Yes    Weight  3 lb    Reps  10-15      Interval Training   Interval Training  No      NuStep   Level  5    Minutes  15    METs  2.1      Biostep-RELP   Level  2    Minutes  15    METs  2      Home Exercise Plan   Plans to continue exercise at  Home (comment)   walking staff videos   Frequency  Add 2 additional days to program exercise sessions.    Initial  Home Exercises Provided  10/11/19       Functional Capacity: 6 Minute Walk    Row Name 08/24/19 1033 01/29/20 1624       6 Minute Walk   Phase  Initial  Discharge    Distance  1170 feet  1340 feet    Distance % Change  --  14.5 %    Distance Feet Change  --  170 ft    Walk Time  6 minutes  6 minutes    # of Rest Breaks  0  0    MPH  2.22  2.53    METS  2.43  2.67    RPE  1  12    Perceived Dyspnea   1  0    VO2 Peak  8.54  9.35    Symptoms  Yes (comment)  No    Comments  SOB  --    Resting HR  70 bpm  65 bpm    Resting BP  112/56  126/64    Resting Oxygen Saturation   97 %  --    Exercise Oxygen Saturation  during 6 min walk  95 %  --    Max Ex. HR  105 bpm  89 bpm    Max Ex. BP  134/70  142/74    2 Minute Post BP  130/64  --       Quality of Life: Quality of Life - 01/29/20 1622      Quality of Life Scores   Health/Function Post  27.4 %    Socioeconomic Post  28.17 %    Psych/Spiritual Post  28.29 %    Family Post  27.6 %    GLOBAL Post  27.76 %       Personal Goals: Goals established at orientation with interventions provided to work toward goal. Personal Goals and Risk Factors at Admission - 08/24/19 1041      Core Components/Risk Factors/Patient Goals on Admission    Weight Management  Yes;Weight Loss    Intervention  Weight Management: Develop a combined  nutrition and exercise program designed to reach desired caloric intake, while maintaining appropriate intake of nutrient and fiber, sodium and fats, and appropriate energy expenditure required for the weight goal.;Weight Management: Provide education and appropriate resources to help participant work on and attain dietary goals.;Weight Management/Obesity: Establish reasonable short term and long term weight goals.    Admit Weight  170 lb (77.1 kg)    Goal Weight: Short Term  165 lb (74.8 kg)    Goal Weight: Long Term  160 lb (72.6 kg)    Expected Outcomes  Short Term: Continue to assess and modify interventions until short term weight is achieved;Long Term: Adherence to nutrition and physical activity/exercise program aimed toward attainment of established weight goal;Weight Loss: Understanding of general recommendations for a balanced deficit meal plan, which promotes 1-2 lb weight loss per week and includes a negative energy balance of [phone] kcal/d;Understanding recommendations for meals to include 15-35% energy as protein, 25-35% energy from fat, 35-60% energy from carbohydrates, less than '200mg'$  of dietary cholesterol, 20-35 gm of total fiber daily;Understanding of distribution of calorie intake throughout the day with the consumption of 4-5 meals/snacks    Improve shortness of breath with ADL's  Yes    Intervention  Provide education, individualized exercise plan and daily activity instruction to help decrease symptoms of SOB with activities of daily living.    Expected Outcomes  Short Term: Improve cardiorespiratory fitness  to achieve a reduction of symptoms when performing ADLs;Long Term: Be able to perform more ADLs without symptoms or delay the onset of symptoms    Hypertension  Yes    Intervention  Provide education on lifestyle modifcations including regular physical activity/exercise, weight management, moderate sodium restriction and increased consumption of fresh fruit, vegetables, and low  fat dairy, alcohol moderation, and smoking cessation.;Monitor prescription use compliance.    Expected Outcomes  Short Term: Continued assessment and intervention until BP is < 140/65m HG in hypertensive participants. < 130/866mHG in hypertensive participants with diabetes, heart failure or chronic kidney disease.;Long Term: Maintenance of blood pressure at goal levels.    Lipids  Yes    Intervention  Provide education and support for participant on nutrition & aerobic/resistive exercise along with prescribed medications to achieve LDL '70mg'$ , HDL >'40mg'$ .    Expected Outcomes  Short Term: Participant states understanding of desired cholesterol values and is compliant with medications prescribed. Participant is following exercise prescription and nutrition guidelines.;Long Term: Cholesterol controlled with medications as prescribed, with individualized exercise RX and with personalized nutrition plan. Value goals: LDL < '70mg'$ , HDL > 40 mg.        Personal Goals Discharge: Goals and Risk Factor Review - 01/15/20 1541      Core Components/Risk Factors/Patient Goals Review   Personal Goals Review  Weight Management/Obesity;Improve shortness of breath with ADL's;Lipids;Hypertension    Review  Pt reports going to echo and stress test tomorrow 1/26. Pt reports her blood pressure and SOB is doing well, pt reports no trouble with ADL with SOB. [facility]; Up to 5 miles on biostep (1 hour sessions).    Expected Outcomes  Short: Continue to work on weight loss.  Long: Continue to monitor risk factors.       Exercise Goals and Review: Exercise Goals    Row Name 08/24/19 1039             Exercise Goals   Increase Physical Activity  Yes       Intervention  Provide advice, education, support and counseling about physical activity/exercise needs.;Develop an individualized exercise prescription for aerobic and resistive training based on initial evaluation findings, risk  stratification, comorbidities and participant's personal goals.       Expected Outcomes  Short Term: Attend rehab on a regular basis to increase amount of physical activity.;Long Term: Add in home exercise to make exercise part of routine and to increase amount of physical activity.;Long Term: Exercising regularly at least 3-5 days a week.       Increase Strength and Stamina  Yes       Intervention  Provide advice, education, support and counseling about physical activity/exercise needs.;Develop an individualized exercise prescription for aerobic and resistive training based on initial evaluation findings, risk stratification, comorbidities and participant's personal goals.       Expected Outcomes  Short Term: Increase workloads from initial exercise prescription for resistance, speed, and METs.;Short Term: Perform resistance training exercises routinely during rehab and add in resistance training at home;Long Term: Improve cardiorespiratory fitness, muscular endurance and strength as measured by increased METs and functional capacity (6MWT)       Able to understand and use rate of perceived exertion (RPE) scale  Yes       Intervention  Provide education and explanation on how to use RPE scale       Expected Outcomes  Short Term: Able to use RPE daily in rehab to  express subjective intensity level;Long Term:  Able to use RPE to guide intensity level when exercising independently       Able to understand and use Dyspnea scale  Yes       Intervention  Provide education and explanation on how to use Dyspnea scale       Expected Outcomes  Short Term: Able to use Dyspnea scale daily in rehab to express subjective sense of shortness of breath during exertion;Long Term: Able to use Dyspnea scale to guide intensity level when exercising independently       Knowledge and understanding of Target Heart Rate Range (THRR)  Yes       Intervention  Provide education and explanation of THRR including how the numbers  were predicted and where they are located for reference       Expected Outcomes  Short Term: Able to state/look up THRR;Short Term: Able to use daily as guideline for intensity in rehab;Long Term: Able to use THRR to govern intensity when exercising independently       Able to check pulse independently  Yes       Intervention  Provide education and demonstration on how to check pulse in carotid and radial arteries.;Review the importance of being able to check your own pulse for safety during independent exercise       Expected Outcomes  Short Term: Able to explain why pulse checking is important during independent exercise;Long Term: Able to check pulse independently and accurately       Understanding of Exercise Prescription  Yes       Intervention  Provide education, explanation, and written materials on patient's individual exercise prescription       Expected Outcomes  Short Term: Able to explain program exercise prescription;Long Term: Able to explain home exercise prescription to exercise independently          Exercise Goals Re-Evaluation: Exercise Goals Re-Evaluation    Row Name 08/30/19 1049 10/03/19 1001 10/11/19 1055 10/25/19 1151 11/09/19 1541     Exercise Goal Re-Evaluation   Exercise Goals Review  Increase Strength and Stamina;Increase Physical Activity;Able to understand and use rate of perceived exertion (RPE) scale;Knowledge and understanding of Target Heart Rate Range (THRR);Understanding of Exercise Prescription  --  Increase Strength and Stamina;Increase Physical Activity;Able to understand and use rate of perceived exertion (RPE) scale;Knowledge and understanding of Target Heart Rate Range (THRR);Understanding of Exercise Prescription;Able to check pulse independently  Increase Physical Activity;Increase Strength and Stamina;Understanding of Exercise Prescription  Increase Physical Activity;Increase Strength and Stamina;Able to understand and use rate of perceived exertion (RPE)  scale;Knowledge and understanding of Target Heart Rate Range (THRR);Able to check pulse independently;Understanding of Exercise Prescription   Comments  Reviewed RPE scale, THR and program prescription with pt today.  Pt voiced understanding and was given a copy of goals to take home.  out since last review  Reviewed home exercise with pt today.  Pt plans to walking at home and staff videos for exercise.  Reviewed THR, pulse, RPE, sign and symptoms, and when to call 911 or MD.  Also discussed weather considerations and indoor options.  Pt voiced understanding.  Collie Siad has returned to exericse.  She only had three visits during the month of October.  She was encouraged to attend regularly again.  We will continue to monitor her progress.  Collie Siad has been attending more regularly in November.  She is working in RPE 11-15.   Expected Outcomes  Short: Use RPE daily to regulate intensity.  Long: Follow program prescription in THR.  --  Short: Start to walk at home.  Long: Exercise independently  Short: Attend rehab regularly.  Long: Continue to rebuild stamina.  Short - continue to attend consistently Long - improve overall MET level   Row Name 11/20/19 1026 12/04/19 1031 12/08/19 1204 12/21/19 1030 01/01/20 1524     Exercise Goal Re-Evaluation   Exercise Goals Review  Increase Physical Activity;Increase Strength and Stamina;Able to understand and use rate of perceived exertion (RPE) scale;Knowledge and understanding of Target Heart Rate Range (THRR);Able to check pulse independently;Understanding of Exercise Prescription  Increase Physical Activity;Increase Strength and Stamina;Able to understand and use rate of perceived exertion (RPE) scale;Knowledge and understanding of Target Heart Rate Range (THRR);Able to check pulse independently;Understanding of Exercise Prescription  Increase Physical Activity;Increase Strength and Stamina;Able to understand and use rate of perceived exertion (RPE) scale;Able to understand and  use Dyspnea scale;Knowledge and understanding of Target Heart Rate Range (THRR);Able to check pulse independently;Understanding of Exercise Prescription  Increase Physical Activity;Increase Strength and Stamina;Able to understand and use rate of perceived exertion (RPE) scale;Knowledge and understanding of Target Heart Rate Range (THRR);Able to check pulse independently;Understanding of Exercise Prescription  Increase Physical Activity;Increase Strength and Stamina;Able to understand and use rate of perceived exertion (RPE) scale;Able to understand and use Dyspnea scale;Knowledge and understanding of Target Heart Rate Range (THRR);Able to check pulse independently;Understanding of Exercise Prescription   Comments  Collie Siad reports that she is using a peddling machine on off days of rehab. She reported that in rehab some of her workloads are starting to seem easier. Guidelines on properly increasing workloads was given.  Collie Siad is doing well in rehab.  She is now up to level 6 on the XR and is doing her home exercise.  She usually gets her one extra day at home.  And she well also walk when she shops.  She is planning to join the [facility] when she graduates.  Overall she is feeling better.  --  Collie Siad continues to do well. Staff will monitor progress.  Collie Siad attends the [facility] on days not at [facility] and rides the bike.   Expected Outcomes  Short: Increase workload on XR machine to level 3 next week. Long: Continue to gain strengh and stamina and increase MET levels.  Short: Continue to improve workloads. Long: Continue to exercise independently  --  Short - continue home exercise and attend program sessions Long - increase stamina  Short - continue to exercise consistently Long - improve MET level   Row Name 01/15/20 1546 01/19/20 1026           Exercise Goal Re-Evaluation   Exercise Goals Review  Increase Physical Activity;Increase Strength and Stamina;Able to understand and use rate of perceived exertion (RPE) scale;Able to  understand and use Dyspnea scale;Knowledge and understanding of Target Heart Rate Range (THRR);Able to check pulse independently;Understanding of Exercise Prescription  Increase Physical Activity;Increase Strength and Stamina;Understanding of Exercise Prescription      Comments  [facility] [employer] [facility] a [facility] and uses a machine similar to the biostep; up to 5 miles (1 hour sessions). Pt reports doing seated exercises when watching TV sometimes. Encouraged pt to exercise most days of the week and include some resistance training.  Collie Siad is doing well in rehab.  She is now on level 5 for the NuStep!  We will continue to monitor her progress.      Expected Outcomes  Short - continue to exercise consistently  Long - improve MET level  Short: Increase workload on BioStep.  Long: Continue to exercise on off days.         Nutrition & Weight - Outcomes: Pre Biometrics - 08/24/19 1040      Pre Biometrics   Height  5' 4.1" (1.628 m)    Weight  170 lb (77.1 kg)    BMI (Calculated)  29.09    Single Leg Stand  1.56 seconds      Post Biometrics - 01/29/20 1625       Post  Biometrics   Height  5' 4.1" (1.628 m)    Weight  165 lb 8 oz (75.1 kg)    BMI (Calculated)  28.32       Nutrition: Nutrition Therapy & Goals - 08/24/19 1034      Nutrition Therapy   Diet  Low Na, HH eating    Protein (specify units)  65g    Fiber  25 grams    Whole Grain Foods  3 servings    Saturated Fats  12 max. grams    Fruits and Vegetables  5 servings/day    Sodium  1.5 grams      Personal Nutrition Goals   Nutrition Goal  ST: add more protein, make sure eating enough kcal LT: get more energy and strength    Comments  Pt reports eating raisin bread toast with some butter (WG) or instant oatmeal (pt reports the lower sugar one) or yogurt (pt reports 60kcal fruit one). Dinner is usually chicken (breast or thigh and skin) with vegetables (occasionally regular pasta , but small amount). Discussed protein and kcal  needs and that she doesn't seem to be getting enough especially not enough to support exercise and muscle building. Talked about maybe a snack before and after exercise (pt wants to do a kind bar before and greek yogurt after). Talked about healthy snacking and how to balance snacks (fat and prtoein especially). Pt reports will sometimes have a yogurt in the middle of the day and will add jello or pudding cup if she feels like she isn't getting enough. Discussed HH eating and weight loss vs healthy outcomes. Discussed that we will review energy and strength levels during program to ensure she is getting enough.      Intervention Plan   Intervention  Prescribe, educate and counsel regarding individualized specific dietary modifications aiming towards targeted core components such as weight, hypertension, lipid management, diabetes, heart failure and other comorbidities.;Nutrition handout(s) given to patient.    Expected Outcomes  Short Term Goal: Understand basic principles of dietary content, such as calories, fat, sodium, cholesterol and nutrients.;Short Term Goal: A plan has been developed with personal nutrition goals set during dietitian appointment.;Long Term Goal: Adherence to prescribed nutrition plan.       Nutrition Discharge: Nutrition Assessments - 08/24/19 1314      MEDFICTS Scores   Pre Score  15       Education Questionnaire Score: Knowledge Questionnaire Score - 01/29/20 1624      Knowledge Questionnaire Score   Post Score  24/26       Goals reviewed with patient; copy given to patient.

## 2020-02-06 ENCOUNTER — Other Ambulatory Visit: Payer: Self-pay

## 2020-02-06 ENCOUNTER — Encounter: Payer: Self-pay | Admitting: Medical Oncology

## 2020-02-06 ENCOUNTER — Emergency Department
Admission: EM | Admit: 2020-02-06 | Discharge: 2020-02-06 | Disposition: A | Discharge status: Home / Self Care | Payer: PPO | Attending: Emergency Medicine | Admitting: Emergency Medicine

## 2020-02-06 DIAGNOSIS — Z7901 Long term (current) use of anticoagulants: Secondary | ICD-10-CM | POA: Insufficient documentation

## 2020-02-06 DIAGNOSIS — I10 Essential (primary) hypertension: Secondary | ICD-10-CM | POA: Diagnosis not present

## 2020-02-06 DIAGNOSIS — R040 Epistaxis: Secondary | ICD-10-CM | POA: Insufficient documentation

## 2020-02-06 DIAGNOSIS — Z7982 Long term (current) use of aspirin: Secondary | ICD-10-CM | POA: Insufficient documentation

## 2020-02-06 DIAGNOSIS — I2510 Atherosclerotic heart disease of native coronary artery without angina pectoris: Secondary | ICD-10-CM | POA: Insufficient documentation

## 2020-02-06 DIAGNOSIS — Z79899 Other long term (current) drug therapy: Secondary | ICD-10-CM | POA: Insufficient documentation

## 2020-02-06 MED ORDER — OXYMETAZOLINE HCL 0.05 % NA SOLN
1.0000 | Freq: Once | NASAL | Status: AC
  Administered 2020-02-06: 10:00:00 1 via NASAL
  Filled 2020-02-06: qty 30

## 2020-02-06 NOTE — ED Triage Notes (Signed)
Pt reports rt nare nose bleed x 1.5 hrs. Pt takes blood thinner.

## 2020-02-06 NOTE — ED Notes (Signed)
See triage note  Presents with nose bleed this am  Bleeding noted to right nare  Was given Afrin and nose clamp in triage

## 2020-02-06 NOTE — Discharge Instructions (Addendum)
Follow discharge care instruction use nasal clamp as directed.  Apply Neosporin to bilateral naris as directed.

## 2020-02-06 NOTE — ED Provider Notes (Signed)
[facility] Emergency Department Provider Note   ____________________________________________   First MD Initiated Contact with Patient 02/06/20 [phone]     (approximate)  I have reviewed the triage vital signs and the nursing notes.   HISTORY  Chief Complaint Epistaxis    HPI Kelsey Carter is a 74 y.o. female patient presents with epistaxis which occurred prior to arrival.  Patient said the right nare has been bleeding for 1/2 hours.  Patient takes a blood thinner.  Patient was given Afrin nasal spray in triage along with a nasal clamp.  Patient stated bleeding has resolved.         Past Medical History:  Diagnosis Date  . Anemia    vitamin d deficiency  . Anxiety   . Arthritis    back, DDD  . Breast cancer ([place])    bilateral mastectomies.  different types of cancer in each breast  . Cardiomyopathy due to chemotherapy ([place]) 2011  . Chronic pain 2019  . Complication of anesthesia   . Coronary artery disease   . GERD (gastroesophageal reflux disease)   . Hyperlipidemia   . Hypertension   . Lumbar herniated disc   . Peripheral vascular disease (HCC)    neuropathies in extremities due to chemotherapy  . Personal history of chemotherapy   . Personal history of radiation therapy   . PONV (postoperative nausea and vomiting)     Patient Active Problem List   Diagnosis Date Noted  . Effort angina ([place]) 08/02/2019  . Angina pectoris ([place]) 07/20/2019  . H/O bilateral mastectomy 02/14/2019  . Neuropathy due to chemotherapeutic drug ([place]) 02/14/2019  . Anxiety 03/23/2018  . Prediabetes 03/23/2018  . DDD (degenerative disc disease), lumbar 03/02/2018  . Chronic low back pain (Primary Area of Pain) (Bilateral) (L>R) with left-sided sciatica 02/15/2018  . Chronic lower extremity pain (Secondary Area of Pain) (Left) 02/15/2018  . Chronic pain syndrome 02/15/2018  . Long term prescription benzodiazepine use 02/15/2018  . Disorder of skeletal  system 02/15/2018  . Problems influencing health status 02/15/2018  . Chronic sacroiliac joint pain 02/15/2018  . Benign essential HTN 10/05/2017  . Coronary artery disease involving native coronary artery of native heart 10/05/2017  . Hyperlipidemia, mixed 10/05/2017  . Elevated TSH 09/21/2017  . Cardiomyopathy due to chemotherapy ([place]) 08/24/2017  . Insomnia 08/24/2017  . Peripheral neuropathy due to chemotherapy ([place]) 08/24/2017  . Bilateral breast cancer ([place]) 08/24/2017  . Onychomycosis 08/24/2017  . Overweight (BMI 25.0-29.9) 08/24/2017  . Vitamin D deficiency 08/24/2017  . Hyperlipidemia 08/24/2017    Past Surgical History:  Procedure Laterality Date  . APPENDECTOMY    . BREAST IMPLANT REMOVAL Bilateral 2016   d/t infection. nothing replaced  . CARDIAC CATHETERIZATION  2018   no blockages. pt thought it was an MI, but it wasn't  . CESAREAN SECTION     x 3  . CORONARY STENT INTERVENTION N/A 08/02/2019   Procedure: CORONARY STENT INTERVENTION;  Surgeon: Wellington Hampshire, MD;  Location: [place] [facility];  Service: Cardiovascular;  Laterality: N/A;  . EYE SURGERY Bilateral 2013   cataract extractions with iol. cornea replacement also  . INTRAVASCULAR PRESSURE WIRE/FFR STUDY N/A 08/02/2019   Procedure: INTRAVASCULAR PRESSURE WIRE/FFR STUDY;  Surgeon: Wellington Hampshire, MD;  Location: [place] [facility];  Service: Cardiovascular;  Laterality: N/A;  . LEFT HEART CATH AND CORONARY ANGIOGRAPHY Left 07/27/2019   Procedure: LEFT HEART CATH AND CORONARY ANGIOGRAPHY;  Surgeon: Corey Skains, MD;  Location: [place] [facility];  Service: Cardiovascular;  Laterality: Left;  . LUMBAR LAMINECTOMY/DECOMPRESSION MICRODISCECTOMY N/A 06/29/2018   Procedure: LUMBAR LAMINECTOMY/DECOMPRESSION MICRODISCECTOMY 1 LEVEL-L4-5, L5-S1 LATERAL DISCECTOMY;  Surgeon: Meade Maw, MD;  Location: [facility] ORS;  Service: Neurosurgery;  Laterality: N/A;  . MASTECTOMY Bilateral 2011, 2016   5 diff  procedures. reconstruction saline removed d/t infx  . nsvd     x 1  . PLACEMENT OF BREAST IMPLANTS Bilateral 2016  . PORT-A-CATH REMOVAL  2016  . TONSILLECTOMY      Prior to Admission medications   Medication Sig Start Date End Date Taking? Authorizing Provider  alendronate (FOSAMAX) 70 MG tablet Take 1 tablet (70 mg total) by mouth once a week. Patient taking differently: Take 70 mg by mouth every Wednesday.  06/19/19   Lloyd Huger, MD  ALPRAZolam (XANAX) 1 MG tablet TAKE 1 TABLET(1 MG) BY MOUTH AT BEDTIME AS NEEDED FOR ANXIETY Patient taking differently: Take 1 mg by mouth at bedtime.  12/15/18   Sindy Guadeloupe, MD  aspirin EC 81 MG tablet Take 81 mg by mouth daily.    [provider]  atorvastatin (LIPITOR) 40 MG tablet Take 40 mg by mouth at bedtime.    [provider]  Cholecalciferol (VITAMIN D) 2000 units CAPS Take 1 capsule (2,000 Units total) by mouth daily. Patient taking differently: Take 4,000 capsules by mouth daily.  08/25/17   Plonk, Gwyndolyn Saxon, MD  clopidogrel (PLAVIX) 75 MG tablet Take 1 tablet (75 mg total) by mouth daily. 08/03/19 04/29/20  Reino Bellis B, NP  MAGNESIUM PO Take 1 tablet by mouth daily.    [provider]  metoprolol succinate (TOPROL-XL) 50 MG 24 hr tablet Take 50 mg by mouth daily.  03/15/18   [provider]  Omega-3 Fatty Acids (FISH OIL) 1000 MG CAPS Take 1,000 mg by mouth daily.    [provider]  telmisartan (MICARDIS) 80 MG tablet Take 1 tablet (80 mg total) by mouth daily. 03/22/18   Plonk, Gwyndolyn Saxon, MD  vitamin C (ASCORBIC ACID) 250 MG tablet Take 500 mg by mouth daily.    [provider]    Allergies Hydrocodone-acetaminophen, Morphine and related, Ciprofloxacin, and Gabapentin  Family History  Problem Relation Age of Onset  . Hypertension Mother   . Cervical cancer Mother   . Renal Disease Mother   . Cancer Father   . Breast cancer Paternal Aunt   . Tuberculosis Maternal  Grandfather   . Breast cancer Paternal Grandmother     Social History Social History   Tobacco Use  . Smoking status: Never Smoker  . Smokeless tobacco: Never Used  Substance Use Topics  . Alcohol use: No  . Drug use: No    Review of Systems Constitutional: No fever/chills Eyes: No visual changes. ENT: No sore throat.  Right nasal bleeding. Cardiovascular: Denies chest pain. Respiratory: Denies shortness of breath. Gastrointestinal: No abdominal pain.  No nausea, no vomiting.  No diarrhea.  No constipation. Genitourinary: Negative for dysuria. Musculoskeletal: Negative for back pain. Skin: Negative for rash. Neurological: Negative for headaches, focal weakness or numbness. Allergic/Immunilogical: Percocet, morphine, Cipro, and gabapentin. ____________________________________________   PHYSICAL EXAM:  VITAL SIGNS: ED Triage Vitals [02/06/20 0935]  Enc Vitals Group     BP 126/84     Pulse Rate 78     Resp 18     Temp 98.2 F (36.8 C)     Temp Source Oral     SpO2 99 %  Weight 160 lb (72.6 kg)     Height 5\' 4"  (1.626 m)     Head Circumference      Peak Flow      Pain Score 0     Pain Loc      Pain Edu?      Excl. in [place]?    Constitutional: Alert and oriented. Well appearing and in no acute distress. Nose: No congestion/rhinnorhea.  Dried blood right nare. Mouth/Throat: Mucous membranes are moist.  Oropharynx non-erythematous. Cardiovascular: Normal rate, regular rhythm. Grossly normal heart sounds.  Good peripheral circulation. Respiratory: Normal respiratory effort.  No retractions. Lungs CTAB. Skin:  Skin is warm, dry and intact. No rash noted. Psychiatric: Mood and affect are normal. Speech and behavior are normal.  ____________________________________________   LABS (all labs ordered are listed, but only abnormal results are displayed)  Labs Reviewed - No data to display ____________________________________________  EKG    ____________________________________________  RADIOLOGY  ED MD interpretation:    Official radiology report(s): No results found.  ____________________________________________   PROCEDURES  Procedure(s) performed (including Critical Care):  Procedures   ____________________________________________   INITIAL IMPRESSION / ASSESSMENT AND PLAN / ED COURSE  As part of my medical decision making, I reviewed the following data within the [facility]      Patient presents with epistaxis from the right nostril.  Bleeding has resolved prior to my examination.  Patient given discharge care instructions and advised return back to ED if condition recurs.    Kelsey Carter was evaluated in Emergency Department on 02/06/2020 for the symptoms described in the history of present illness. She was evaluated in the context of the global COVID-19 pandemic, which necessitated consideration that the patient might be at risk for infection with the SARS-CoV-2 virus that causes COVID-19. Institutional protocols and algorithms that pertain to the evaluation of patients at risk for COVID-19 are in a state of rapid change based on information released by regulatory bodies including the CDC and federal and state organizations. These policies and algorithms were followed during the patient's care in the ED.       ____________________________________________   FINAL CLINICAL IMPRESSION(S) / ED DIAGNOSES  Final diagnoses:  Right-sided epistaxis     ED Discharge Orders    None       Note:  This document was prepared using Dragon voice recognition software and may include unintentional dictation errors.    Sable Feil, PA-C 02/06/20 1019    Carrie Mew, MD 02/06/20 [phone]

## 2020-02-15 DIAGNOSIS — D649 Anemia, unspecified: Secondary | ICD-10-CM | POA: Diagnosis not present

## 2020-02-15 DIAGNOSIS — Z7689 Persons encountering health services in other specified circumstances: Secondary | ICD-10-CM | POA: Diagnosis not present

## 2020-02-15 DIAGNOSIS — T451X5A Adverse effect of antineoplastic and immunosuppressive drugs, initial encounter: Secondary | ICD-10-CM | POA: Diagnosis not present

## 2020-02-15 DIAGNOSIS — I480 Paroxysmal atrial fibrillation: Secondary | ICD-10-CM | POA: Diagnosis not present

## 2020-02-15 DIAGNOSIS — E782 Mixed hyperlipidemia: Secondary | ICD-10-CM | POA: Diagnosis not present

## 2020-02-15 DIAGNOSIS — G620 Drug-induced polyneuropathy: Secondary | ICD-10-CM | POA: Diagnosis not present

## 2020-02-15 DIAGNOSIS — R8271 Bacteriuria: Secondary | ICD-10-CM | POA: Diagnosis not present

## 2020-02-15 DIAGNOSIS — I2510 Atherosclerotic heart disease of native coronary artery without angina pectoris: Secondary | ICD-10-CM | POA: Diagnosis not present

## 2020-02-15 DIAGNOSIS — R7309 Other abnormal glucose: Secondary | ICD-10-CM | POA: Diagnosis not present

## 2020-02-15 DIAGNOSIS — M810 Age-related osteoporosis without current pathological fracture: Secondary | ICD-10-CM | POA: Diagnosis not present

## 2020-02-15 DIAGNOSIS — Z9013 Acquired absence of bilateral breasts and nipples: Secondary | ICD-10-CM | POA: Diagnosis not present

## 2020-02-22 DIAGNOSIS — T451X5A Adverse effect of antineoplastic and immunosuppressive drugs, initial encounter: Secondary | ICD-10-CM | POA: Diagnosis not present

## 2020-02-22 DIAGNOSIS — I2510 Atherosclerotic heart disease of native coronary artery without angina pectoris: Secondary | ICD-10-CM | POA: Diagnosis not present

## 2020-02-22 DIAGNOSIS — Z9013 Acquired absence of bilateral breasts and nipples: Secondary | ICD-10-CM | POA: Diagnosis not present

## 2020-02-22 DIAGNOSIS — K148 Other diseases of tongue: Secondary | ICD-10-CM | POA: Diagnosis not present

## 2020-02-22 DIAGNOSIS — Z0000 Encounter for general adult medical examination without abnormal findings: Secondary | ICD-10-CM | POA: Diagnosis not present

## 2020-02-22 DIAGNOSIS — I10 Essential (primary) hypertension: Secondary | ICD-10-CM | POA: Diagnosis not present

## 2020-02-22 DIAGNOSIS — G620 Drug-induced polyneuropathy: Secondary | ICD-10-CM | POA: Diagnosis not present

## 2020-02-22 DIAGNOSIS — E782 Mixed hyperlipidemia: Secondary | ICD-10-CM | POA: Diagnosis not present

## 2020-02-22 DIAGNOSIS — D649 Anemia, unspecified: Secondary | ICD-10-CM | POA: Diagnosis not present

## 2020-02-22 DIAGNOSIS — I480 Paroxysmal atrial fibrillation: Secondary | ICD-10-CM | POA: Diagnosis not present

## 2020-02-27 DIAGNOSIS — K1321 Leukoplakia of oral mucosa, including tongue: Secondary | ICD-10-CM | POA: Diagnosis not present

## 2020-02-27 DIAGNOSIS — K121 Other forms of stomatitis: Secondary | ICD-10-CM | POA: Diagnosis not present

## 2020-03-07 DIAGNOSIS — I2510 Atherosclerotic heart disease of native coronary artery without angina pectoris: Secondary | ICD-10-CM | POA: Diagnosis not present

## 2020-03-07 DIAGNOSIS — Z9013 Acquired absence of bilateral breasts and nipples: Secondary | ICD-10-CM | POA: Diagnosis not present

## 2020-03-07 DIAGNOSIS — E538 Deficiency of other specified B group vitamins: Secondary | ICD-10-CM | POA: Diagnosis not present

## 2020-03-07 DIAGNOSIS — I10 Essential (primary) hypertension: Secondary | ICD-10-CM | POA: Diagnosis not present

## 2020-03-07 DIAGNOSIS — D509 Iron deficiency anemia, unspecified: Secondary | ICD-10-CM | POA: Diagnosis not present

## 2020-03-07 DIAGNOSIS — T451X5A Adverse effect of antineoplastic and immunosuppressive drugs, initial encounter: Secondary | ICD-10-CM | POA: Diagnosis not present

## 2020-03-07 DIAGNOSIS — D649 Anemia, unspecified: Secondary | ICD-10-CM | POA: Diagnosis not present

## 2020-03-07 DIAGNOSIS — E782 Mixed hyperlipidemia: Secondary | ICD-10-CM | POA: Diagnosis not present

## 2020-03-07 DIAGNOSIS — G620 Drug-induced polyneuropathy: Secondary | ICD-10-CM | POA: Diagnosis not present

## 2020-03-07 DIAGNOSIS — I480 Paroxysmal atrial fibrillation: Secondary | ICD-10-CM | POA: Diagnosis not present

## 2020-03-07 DIAGNOSIS — K148 Other diseases of tongue: Secondary | ICD-10-CM | POA: Diagnosis not present

## 2020-03-12 ENCOUNTER — Other Ambulatory Visit: Payer: Self-pay | Admitting: Otolaryngology

## 2020-03-12 DIAGNOSIS — K1329 Other disturbances of oral epithelium, including tongue: Secondary | ICD-10-CM | POA: Diagnosis not present

## 2020-03-12 DIAGNOSIS — L570 Actinic keratosis: Secondary | ICD-10-CM | POA: Diagnosis not present

## 2020-03-12 DIAGNOSIS — D3702 Neoplasm of uncertain behavior of tongue: Secondary | ICD-10-CM | POA: Diagnosis not present

## 2020-03-15 LAB — SURGICAL PATHOLOGY

## 2020-06-21 DIAGNOSIS — E782 Mixed hyperlipidemia: Secondary | ICD-10-CM | POA: Diagnosis not present

## 2020-06-21 DIAGNOSIS — I10 Essential (primary) hypertension: Secondary | ICD-10-CM | POA: Diagnosis not present

## 2020-06-21 DIAGNOSIS — D649 Anemia, unspecified: Secondary | ICD-10-CM | POA: Diagnosis not present

## 2020-06-21 DIAGNOSIS — Z9013 Acquired absence of bilateral breasts and nipples: Secondary | ICD-10-CM | POA: Diagnosis not present

## 2020-06-21 DIAGNOSIS — D509 Iron deficiency anemia, unspecified: Secondary | ICD-10-CM | POA: Diagnosis not present

## 2020-06-21 DIAGNOSIS — E538 Deficiency of other specified B group vitamins: Secondary | ICD-10-CM | POA: Diagnosis not present

## 2020-06-21 DIAGNOSIS — I2510 Atherosclerotic heart disease of native coronary artery without angina pectoris: Secondary | ICD-10-CM | POA: Diagnosis not present

## 2020-06-21 DIAGNOSIS — K148 Other diseases of tongue: Secondary | ICD-10-CM | POA: Diagnosis not present

## 2020-06-21 DIAGNOSIS — T451X5A Adverse effect of antineoplastic and immunosuppressive drugs, initial encounter: Secondary | ICD-10-CM | POA: Diagnosis not present

## 2020-06-21 DIAGNOSIS — I480 Paroxysmal atrial fibrillation: Secondary | ICD-10-CM | POA: Diagnosis not present

## 2020-06-21 DIAGNOSIS — G620 Drug-induced polyneuropathy: Secondary | ICD-10-CM | POA: Diagnosis not present

## 2020-06-27 DIAGNOSIS — I10 Essential (primary) hypertension: Secondary | ICD-10-CM | POA: Diagnosis not present

## 2020-06-27 DIAGNOSIS — R5383 Other fatigue: Secondary | ICD-10-CM | POA: Diagnosis not present

## 2020-06-27 DIAGNOSIS — I2510 Atherosclerotic heart disease of native coronary artery without angina pectoris: Secondary | ICD-10-CM | POA: Diagnosis not present

## 2020-06-27 DIAGNOSIS — I480 Paroxysmal atrial fibrillation: Secondary | ICD-10-CM | POA: Diagnosis not present

## 2020-06-27 DIAGNOSIS — D649 Anemia, unspecified: Secondary | ICD-10-CM | POA: Diagnosis not present

## 2020-06-27 DIAGNOSIS — Z9013 Acquired absence of bilateral breasts and nipples: Secondary | ICD-10-CM | POA: Diagnosis not present

## 2020-06-27 DIAGNOSIS — E782 Mixed hyperlipidemia: Secondary | ICD-10-CM | POA: Diagnosis not present

## 2020-06-27 DIAGNOSIS — R5381 Other malaise: Secondary | ICD-10-CM | POA: Diagnosis not present

## 2020-07-19 DIAGNOSIS — E782 Mixed hyperlipidemia: Secondary | ICD-10-CM | POA: Diagnosis not present

## 2020-07-19 DIAGNOSIS — I2510 Atherosclerotic heart disease of native coronary artery without angina pectoris: Secondary | ICD-10-CM | POA: Diagnosis not present

## 2020-07-19 DIAGNOSIS — I10 Essential (primary) hypertension: Secondary | ICD-10-CM | POA: Diagnosis not present

## 2020-07-19 DIAGNOSIS — I480 Paroxysmal atrial fibrillation: Secondary | ICD-10-CM | POA: Diagnosis not present

## 2020-08-14 DIAGNOSIS — R300 Dysuria: Secondary | ICD-10-CM | POA: Diagnosis not present

## 2020-08-14 DIAGNOSIS — N390 Urinary tract infection, site not specified: Secondary | ICD-10-CM | POA: Diagnosis not present

## 2020-08-23 DIAGNOSIS — B351 Tinea unguium: Secondary | ICD-10-CM | POA: Diagnosis not present

## 2020-08-23 DIAGNOSIS — G620 Drug-induced polyneuropathy: Secondary | ICD-10-CM | POA: Diagnosis not present

## 2020-09-11 ENCOUNTER — Other Ambulatory Visit: Payer: PPO

## 2020-09-11 ENCOUNTER — Other Ambulatory Visit: Payer: Self-pay

## 2020-09-11 DIAGNOSIS — Z20822 Contact with and (suspected) exposure to covid-19: Secondary | ICD-10-CM | POA: Diagnosis not present

## 2020-09-13 LAB — SARS-COV-2, NAA 2 DAY TAT

## 2020-09-13 LAB — NOVEL CORONAVIRUS, NAA: SARS-CoV-2, NAA: NOT DETECTED

## 2020-10-03 DIAGNOSIS — J019 Acute sinusitis, unspecified: Secondary | ICD-10-CM | POA: Diagnosis not present

## 2020-10-03 DIAGNOSIS — B9689 Other specified bacterial agents as the cause of diseases classified elsewhere: Secondary | ICD-10-CM | POA: Diagnosis not present

## 2020-10-03 DIAGNOSIS — J029 Acute pharyngitis, unspecified: Secondary | ICD-10-CM | POA: Diagnosis not present

## 2020-10-03 DIAGNOSIS — H66003 Acute suppurative otitis media without spontaneous rupture of ear drum, bilateral: Secondary | ICD-10-CM | POA: Diagnosis not present

## 2020-10-07 DIAGNOSIS — J40 Bronchitis, not specified as acute or chronic: Secondary | ICD-10-CM | POA: Diagnosis not present

## 2020-10-07 DIAGNOSIS — I2510 Atherosclerotic heart disease of native coronary artery without angina pectoris: Secondary | ICD-10-CM | POA: Diagnosis not present

## 2020-10-07 DIAGNOSIS — I10 Essential (primary) hypertension: Secondary | ICD-10-CM | POA: Diagnosis not present

## 2020-10-07 DIAGNOSIS — I517 Cardiomegaly: Secondary | ICD-10-CM | POA: Diagnosis not present

## 2020-10-24 DIAGNOSIS — I2510 Atherosclerotic heart disease of native coronary artery without angina pectoris: Secondary | ICD-10-CM | POA: Diagnosis not present

## 2020-10-24 DIAGNOSIS — R5383 Other fatigue: Secondary | ICD-10-CM | POA: Diagnosis not present

## 2020-10-24 DIAGNOSIS — Z9013 Acquired absence of bilateral breasts and nipples: Secondary | ICD-10-CM | POA: Diagnosis not present

## 2020-10-24 DIAGNOSIS — R7309 Other abnormal glucose: Secondary | ICD-10-CM | POA: Diagnosis not present

## 2020-10-24 DIAGNOSIS — I480 Paroxysmal atrial fibrillation: Secondary | ICD-10-CM | POA: Diagnosis not present

## 2020-10-24 DIAGNOSIS — R5381 Other malaise: Secondary | ICD-10-CM | POA: Diagnosis not present

## 2020-10-24 DIAGNOSIS — I10 Essential (primary) hypertension: Secondary | ICD-10-CM | POA: Diagnosis not present

## 2020-10-24 DIAGNOSIS — E782 Mixed hyperlipidemia: Secondary | ICD-10-CM | POA: Diagnosis not present

## 2020-10-24 IMAGING — CR CHEST - 2 VIEW
1 series · 2 of 2 positions shown · non-contrast
Comparison: 09/22/2011

CLINICAL DATA: Chest pain

EXAM:
CHEST - 2 VIEW

[Series 1: dg chest 2 view · 0.14mm/px · 2 of 2 slices shown]
[im 1/2]
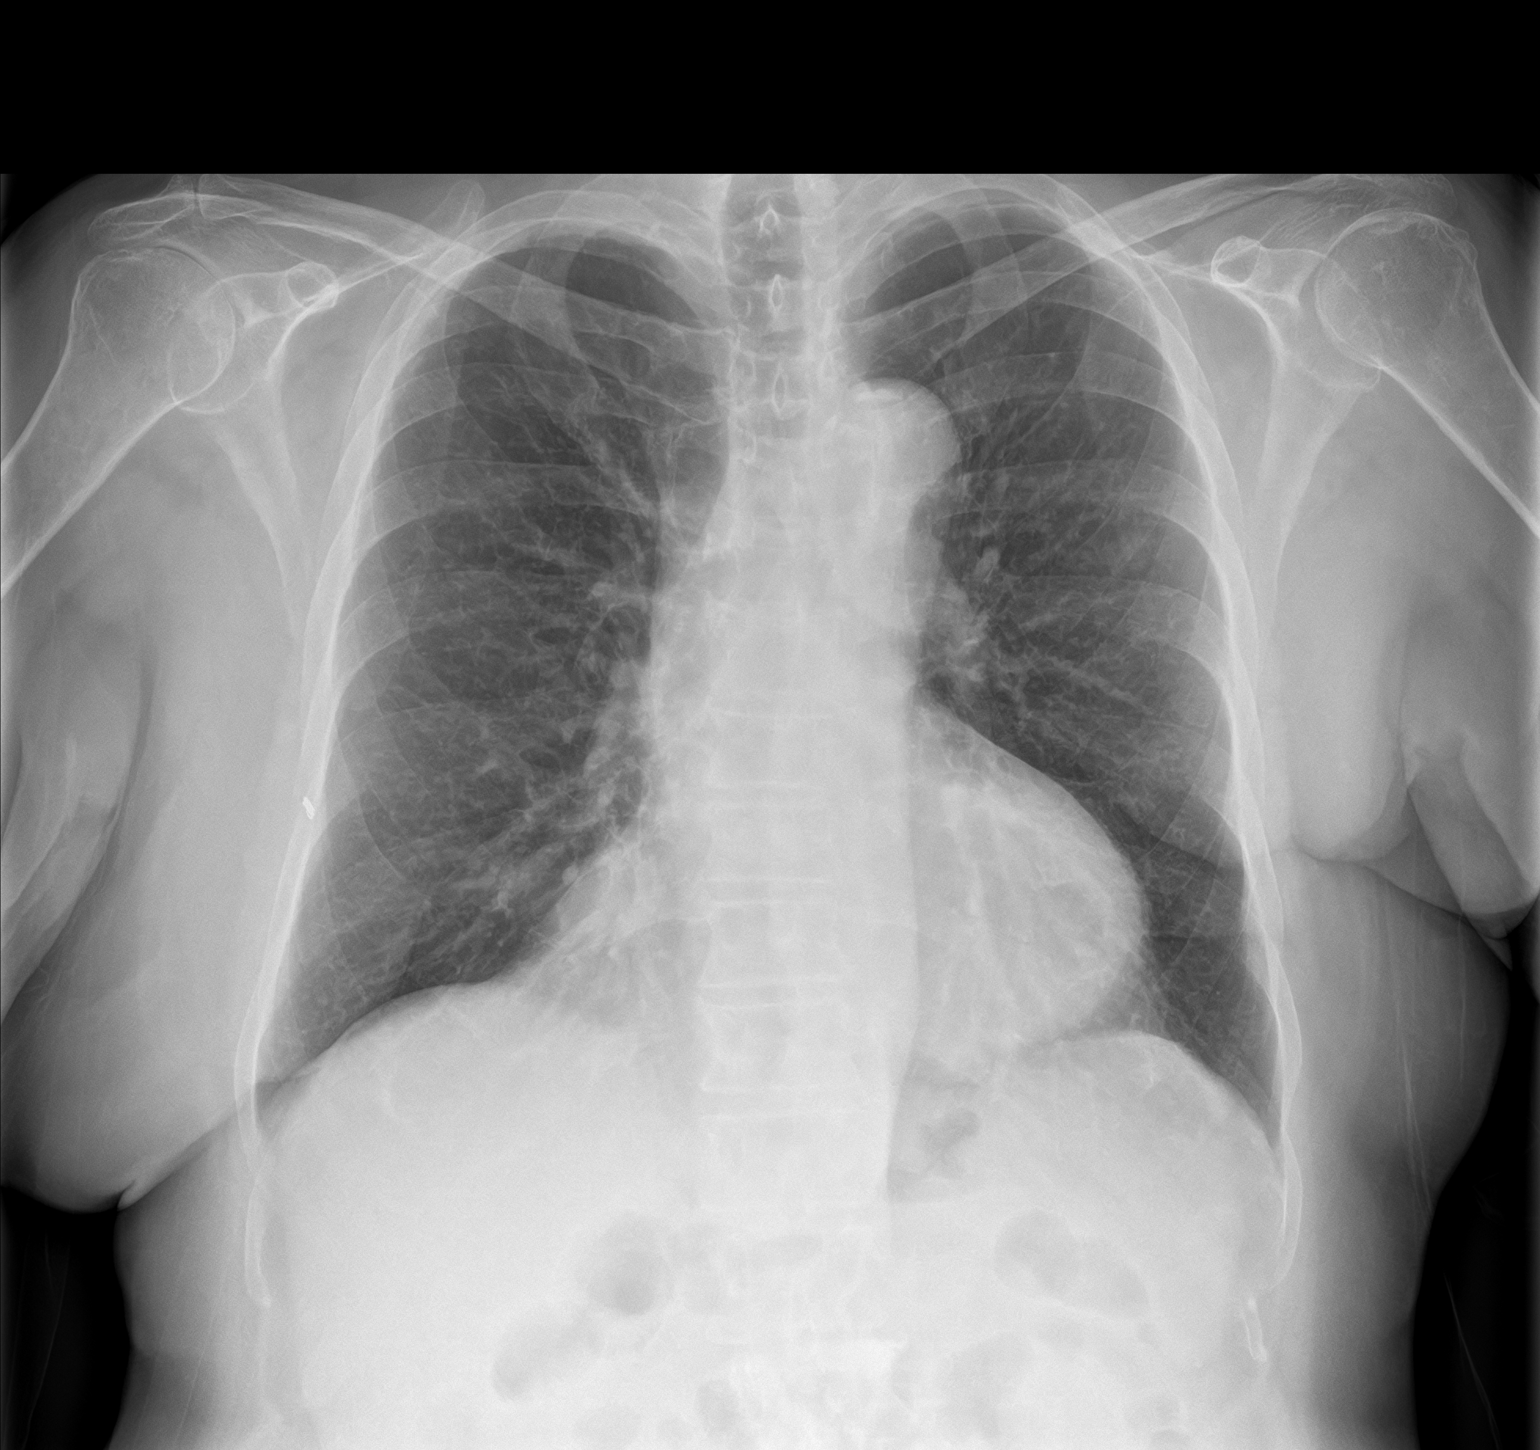
[im 2/2]
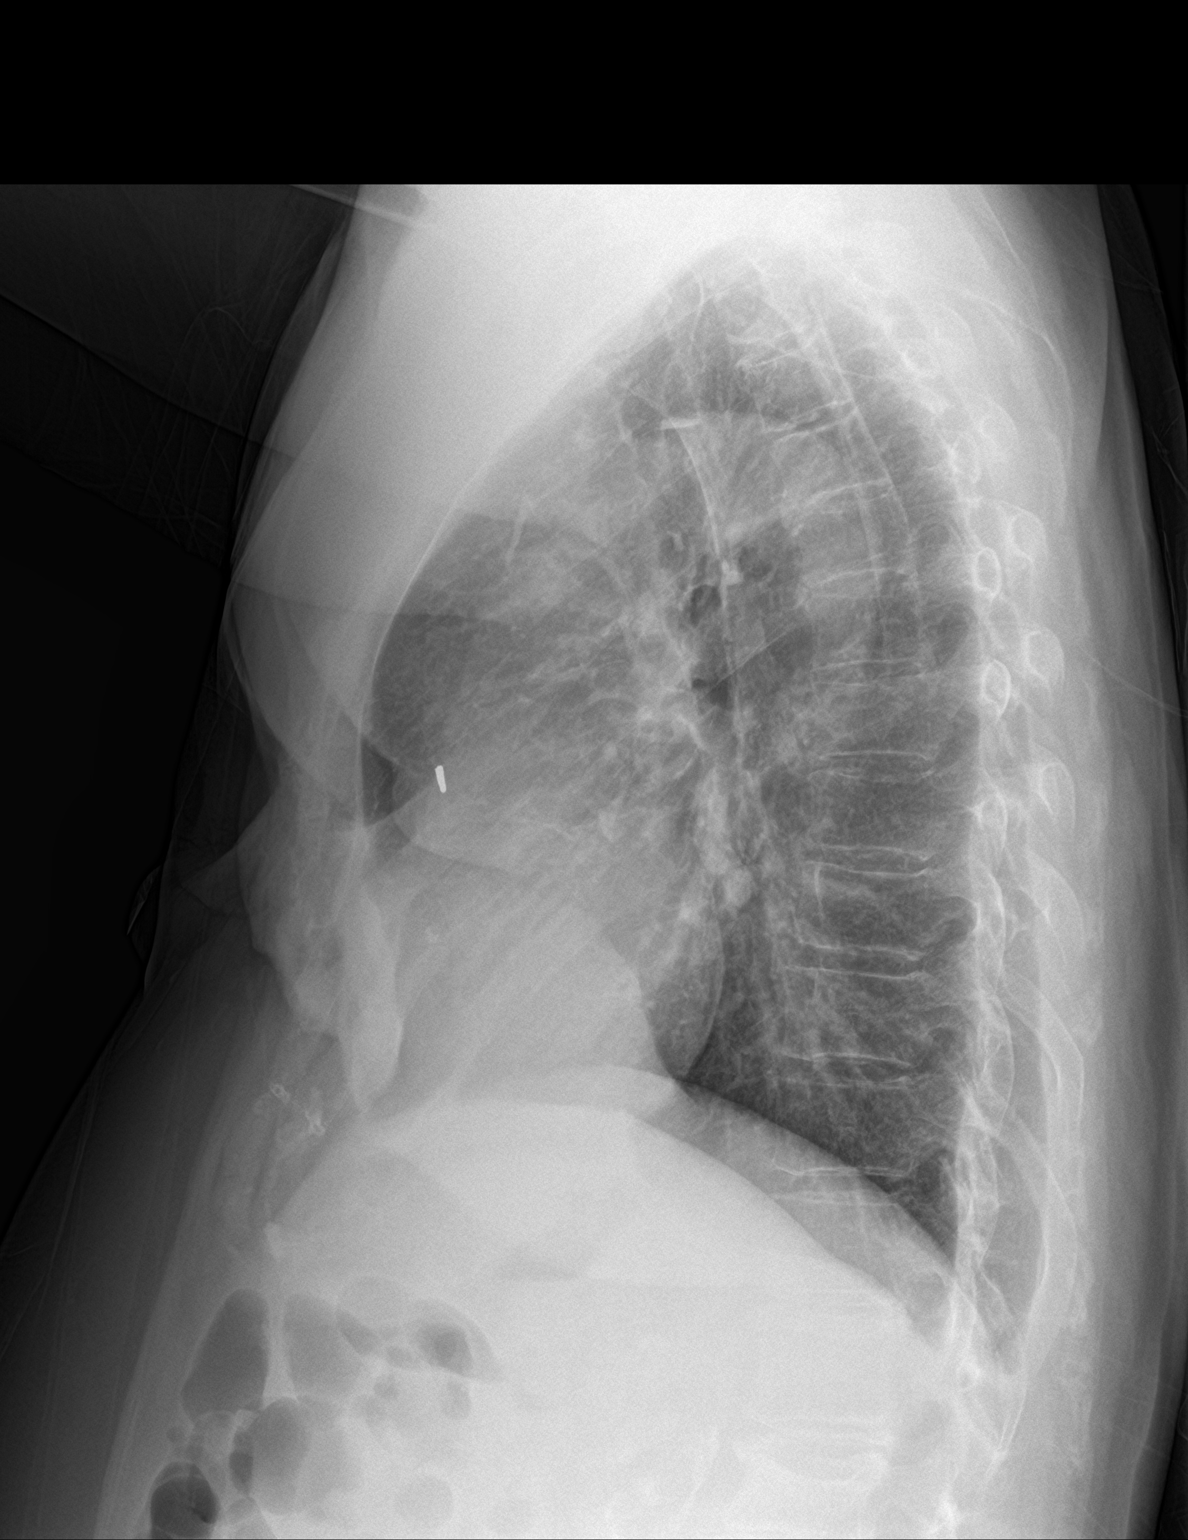

[2 of 2 positions shown; findings below may reference images not displayed]

FINDINGS: Normal heart size. Blunting of the right cardiophrenic sulcus from
fat pad by 4013 CT. Stable mediastinal contours. There is no edema,
consolidation, effusion, or pneumothorax. Mastectomy. Remote lumbar
compression fracture deformity.
IMPRESSION: No evidence of acute disease.

## 2020-10-25 DIAGNOSIS — R8290 Unspecified abnormal findings in urine: Secondary | ICD-10-CM | POA: Diagnosis not present

## 2020-10-31 DIAGNOSIS — D649 Anemia, unspecified: Secondary | ICD-10-CM | POA: Diagnosis not present

## 2020-10-31 DIAGNOSIS — M48061 Spinal stenosis, lumbar region without neurogenic claudication: Secondary | ICD-10-CM | POA: Diagnosis not present

## 2020-10-31 DIAGNOSIS — I427 Cardiomyopathy due to drug and external agent: Secondary | ICD-10-CM | POA: Diagnosis not present

## 2020-10-31 DIAGNOSIS — I10 Essential (primary) hypertension: Secondary | ICD-10-CM | POA: Diagnosis not present

## 2020-10-31 DIAGNOSIS — Z9889 Other specified postprocedural states: Secondary | ICD-10-CM | POA: Diagnosis not present

## 2020-10-31 DIAGNOSIS — M5136 Other intervertebral disc degeneration, lumbar region: Secondary | ICD-10-CM | POA: Diagnosis not present

## 2020-10-31 DIAGNOSIS — C50911 Malignant neoplasm of unspecified site of right female breast: Secondary | ICD-10-CM | POA: Diagnosis not present

## 2020-10-31 DIAGNOSIS — E875 Hyperkalemia: Secondary | ICD-10-CM | POA: Diagnosis not present

## 2020-10-31 DIAGNOSIS — R5381 Other malaise: Secondary | ICD-10-CM | POA: Diagnosis not present

## 2020-10-31 DIAGNOSIS — I480 Paroxysmal atrial fibrillation: Secondary | ICD-10-CM | POA: Diagnosis not present

## 2020-10-31 DIAGNOSIS — Z0000 Encounter for general adult medical examination without abnormal findings: Secondary | ICD-10-CM | POA: Diagnosis not present

## 2020-10-31 DIAGNOSIS — R5383 Other fatigue: Secondary | ICD-10-CM | POA: Diagnosis not present

## 2020-10-31 DIAGNOSIS — I2510 Atherosclerotic heart disease of native coronary artery without angina pectoris: Secondary | ICD-10-CM | POA: Diagnosis not present

## 2020-11-22 DIAGNOSIS — M79674 Pain in right toe(s): Secondary | ICD-10-CM | POA: Diagnosis not present

## 2020-11-22 DIAGNOSIS — M79675 Pain in left toe(s): Secondary | ICD-10-CM | POA: Diagnosis not present

## 2020-11-22 DIAGNOSIS — B351 Tinea unguium: Secondary | ICD-10-CM | POA: Diagnosis not present

## 2021-01-20 DIAGNOSIS — I2510 Atherosclerotic heart disease of native coronary artery without angina pectoris: Secondary | ICD-10-CM | POA: Diagnosis not present

## 2021-01-20 DIAGNOSIS — R9431 Abnormal electrocardiogram [ECG] [EKG]: Secondary | ICD-10-CM | POA: Diagnosis not present

## 2021-01-20 DIAGNOSIS — I10 Essential (primary) hypertension: Secondary | ICD-10-CM | POA: Diagnosis not present

## 2021-01-20 DIAGNOSIS — E782 Mixed hyperlipidemia: Secondary | ICD-10-CM | POA: Diagnosis not present

## 2021-01-20 DIAGNOSIS — I480 Paroxysmal atrial fibrillation: Secondary | ICD-10-CM | POA: Diagnosis not present

## 2021-02-24 DIAGNOSIS — R5383 Other fatigue: Secondary | ICD-10-CM | POA: Diagnosis not present

## 2021-02-24 DIAGNOSIS — T451X5A Adverse effect of antineoplastic and immunosuppressive drugs, initial encounter: Secondary | ICD-10-CM | POA: Diagnosis not present

## 2021-02-24 DIAGNOSIS — C50911 Malignant neoplasm of unspecified site of right female breast: Secondary | ICD-10-CM | POA: Diagnosis not present

## 2021-02-24 DIAGNOSIS — I2510 Atherosclerotic heart disease of native coronary artery without angina pectoris: Secondary | ICD-10-CM | POA: Diagnosis not present

## 2021-02-24 DIAGNOSIS — Z79899 Other long term (current) drug therapy: Secondary | ICD-10-CM | POA: Diagnosis not present

## 2021-02-24 DIAGNOSIS — D649 Anemia, unspecified: Secondary | ICD-10-CM | POA: Diagnosis not present

## 2021-02-24 DIAGNOSIS — I10 Essential (primary) hypertension: Secondary | ICD-10-CM | POA: Diagnosis not present

## 2021-02-24 DIAGNOSIS — C50912 Malignant neoplasm of unspecified site of left female breast: Secondary | ICD-10-CM | POA: Diagnosis not present

## 2021-02-24 DIAGNOSIS — M5136 Other intervertebral disc degeneration, lumbar region: Secondary | ICD-10-CM | POA: Diagnosis not present

## 2021-02-24 DIAGNOSIS — R8290 Unspecified abnormal findings in urine: Secondary | ICD-10-CM | POA: Diagnosis not present

## 2021-02-24 DIAGNOSIS — I480 Paroxysmal atrial fibrillation: Secondary | ICD-10-CM | POA: Diagnosis not present

## 2021-02-24 DIAGNOSIS — I427 Cardiomyopathy due to drug and external agent: Secondary | ICD-10-CM | POA: Diagnosis not present

## 2021-02-24 DIAGNOSIS — R5381 Other malaise: Secondary | ICD-10-CM | POA: Diagnosis not present

## 2021-02-24 DIAGNOSIS — Z0000 Encounter for general adult medical examination without abnormal findings: Secondary | ICD-10-CM | POA: Diagnosis not present

## 2021-03-03 DIAGNOSIS — I10 Essential (primary) hypertension: Secondary | ICD-10-CM | POA: Diagnosis not present

## 2021-03-03 DIAGNOSIS — G620 Drug-induced polyneuropathy: Secondary | ICD-10-CM | POA: Diagnosis not present

## 2021-03-03 DIAGNOSIS — Z0000 Encounter for general adult medical examination without abnormal findings: Secondary | ICD-10-CM | POA: Diagnosis not present

## 2021-03-03 DIAGNOSIS — R350 Frequency of micturition: Secondary | ICD-10-CM | POA: Diagnosis not present

## 2021-03-03 DIAGNOSIS — C50912 Malignant neoplasm of unspecified site of left female breast: Secondary | ICD-10-CM | POA: Diagnosis not present

## 2021-03-03 DIAGNOSIS — I2510 Atherosclerotic heart disease of native coronary artery without angina pectoris: Secondary | ICD-10-CM | POA: Diagnosis not present

## 2021-03-03 DIAGNOSIS — R5383 Other fatigue: Secondary | ICD-10-CM | POA: Diagnosis not present

## 2021-03-03 DIAGNOSIS — D649 Anemia, unspecified: Secondary | ICD-10-CM | POA: Diagnosis not present

## 2021-03-03 DIAGNOSIS — E039 Hypothyroidism, unspecified: Secondary | ICD-10-CM | POA: Diagnosis not present

## 2021-03-03 DIAGNOSIS — C50911 Malignant neoplasm of unspecified site of right female breast: Secondary | ICD-10-CM | POA: Diagnosis not present

## 2021-03-03 DIAGNOSIS — R5381 Other malaise: Secondary | ICD-10-CM | POA: Diagnosis not present

## 2021-03-03 DIAGNOSIS — I480 Paroxysmal atrial fibrillation: Secondary | ICD-10-CM | POA: Diagnosis not present

## 2021-03-03 DIAGNOSIS — Z9889 Other specified postprocedural states: Secondary | ICD-10-CM | POA: Diagnosis not present

## 2021-03-14 DIAGNOSIS — L84 Corns and callosities: Secondary | ICD-10-CM | POA: Diagnosis not present

## 2021-03-14 DIAGNOSIS — L851 Acquired keratosis [keratoderma] palmaris et plantaris: Secondary | ICD-10-CM | POA: Diagnosis not present

## 2021-03-14 DIAGNOSIS — B351 Tinea unguium: Secondary | ICD-10-CM | POA: Diagnosis not present

## 2021-03-14 DIAGNOSIS — M79675 Pain in left toe(s): Secondary | ICD-10-CM | POA: Diagnosis not present

## 2021-03-14 DIAGNOSIS — M79674 Pain in right toe(s): Secondary | ICD-10-CM | POA: Diagnosis not present

## 2021-05-12 DIAGNOSIS — L03114 Cellulitis of left upper limb: Secondary | ICD-10-CM | POA: Diagnosis not present

## 2021-05-12 DIAGNOSIS — R600 Localized edema: Secondary | ICD-10-CM | POA: Diagnosis not present

## 2021-05-12 DIAGNOSIS — L089 Local infection of the skin and subcutaneous tissue, unspecified: Secondary | ICD-10-CM | POA: Diagnosis not present

## 2021-05-12 DIAGNOSIS — S40862A Insect bite (nonvenomous) of left upper arm, initial encounter: Secondary | ICD-10-CM | POA: Diagnosis not present

## 2021-05-12 DIAGNOSIS — W57XXXA Bitten or stung by nonvenomous insect and other nonvenomous arthropods, initial encounter: Secondary | ICD-10-CM | POA: Diagnosis not present

## 2021-05-21 DIAGNOSIS — L03114 Cellulitis of left upper limb: Secondary | ICD-10-CM | POA: Diagnosis not present

## 2021-05-21 DIAGNOSIS — L309 Dermatitis, unspecified: Secondary | ICD-10-CM | POA: Diagnosis not present

## 2021-05-23 ENCOUNTER — Other Ambulatory Visit (HOSPITAL_COMMUNITY): Payer: Self-pay | Admitting: Physician Assistant

## 2021-05-23 ENCOUNTER — Other Ambulatory Visit: Payer: Self-pay | Admitting: Physician Assistant

## 2021-05-23 DIAGNOSIS — M4807 Spinal stenosis, lumbosacral region: Secondary | ICD-10-CM | POA: Diagnosis not present

## 2021-05-23 DIAGNOSIS — I480 Paroxysmal atrial fibrillation: Secondary | ICD-10-CM | POA: Diagnosis not present

## 2021-05-23 DIAGNOSIS — I10 Essential (primary) hypertension: Secondary | ICD-10-CM | POA: Diagnosis not present

## 2021-05-23 DIAGNOSIS — Z9889 Other specified postprocedural states: Secondary | ICD-10-CM | POA: Diagnosis not present

## 2021-06-04 ENCOUNTER — Other Ambulatory Visit: Payer: Self-pay

## 2021-06-04 ENCOUNTER — Ambulatory Visit
Admission: RE | Admit: 2021-06-04 | Discharge: 2021-06-04 | Disposition: A | Discharge status: Home / Self Care | Payer: PPO | Source: Ambulatory Visit | Attending: Physician Assistant | Admitting: Physician Assistant

## 2021-06-04 ENCOUNTER — Emergency Department
Admission: EM | Admit: 2021-06-04 | Discharge: 2021-06-04 | Disposition: A | Discharge status: Home / Self Care | Payer: PPO | Attending: Emergency Medicine | Admitting: Emergency Medicine

## 2021-06-04 ENCOUNTER — Encounter: Payer: Self-pay | Admitting: Intensive Care

## 2021-06-04 DIAGNOSIS — S32029A Unspecified fracture of second lumbar vertebra, initial encounter for closed fracture: Secondary | ICD-10-CM | POA: Diagnosis not present

## 2021-06-04 DIAGNOSIS — Z7982 Long term (current) use of aspirin: Secondary | ICD-10-CM | POA: Diagnosis not present

## 2021-06-04 DIAGNOSIS — M4807 Spinal stenosis, lumbosacral region: Secondary | ICD-10-CM | POA: Insufficient documentation

## 2021-06-04 DIAGNOSIS — Z955 Presence of coronary angioplasty implant and graft: Secondary | ICD-10-CM | POA: Diagnosis not present

## 2021-06-04 DIAGNOSIS — I2510 Atherosclerotic heart disease of native coronary artery without angina pectoris: Secondary | ICD-10-CM | POA: Insufficient documentation

## 2021-06-04 DIAGNOSIS — M5137 Other intervertebral disc degeneration, lumbosacral region: Secondary | ICD-10-CM | POA: Diagnosis not present

## 2021-06-04 DIAGNOSIS — S3992XA Unspecified injury of lower back, initial encounter: Secondary | ICD-10-CM | POA: Diagnosis present

## 2021-06-04 DIAGNOSIS — S32020A Wedge compression fracture of second lumbar vertebra, initial encounter for closed fracture: Secondary | ICD-10-CM

## 2021-06-04 DIAGNOSIS — Z79899 Other long term (current) drug therapy: Secondary | ICD-10-CM | POA: Insufficient documentation

## 2021-06-04 DIAGNOSIS — M545 Low back pain, unspecified: Secondary | ICD-10-CM | POA: Diagnosis not present

## 2021-06-04 DIAGNOSIS — M5136 Other intervertebral disc degeneration, lumbar region: Secondary | ICD-10-CM | POA: Diagnosis not present

## 2021-06-04 DIAGNOSIS — R52 Pain, unspecified: Secondary | ICD-10-CM | POA: Diagnosis not present

## 2021-06-04 DIAGNOSIS — I10 Essential (primary) hypertension: Secondary | ICD-10-CM | POA: Diagnosis not present

## 2021-06-04 DIAGNOSIS — Z853 Personal history of malignant neoplasm of breast: Secondary | ICD-10-CM | POA: Insufficient documentation

## 2021-06-04 DIAGNOSIS — X58XXXA Exposure to other specified factors, initial encounter: Secondary | ICD-10-CM | POA: Insufficient documentation

## 2021-06-04 DIAGNOSIS — M549 Dorsalgia, unspecified: Secondary | ICD-10-CM | POA: Diagnosis not present

## 2021-06-04 LAB — CBC WITH DIFFERENTIAL/PLATELET
Abs Immature Granulocytes: 0.04 10*3/uL (ref 0.00–0.07)
Basophils Absolute: 0 10*3/uL (ref 0.0–0.1)
Basophils Relative: 1 %
Eosinophils Absolute: 0.1 10*3/uL (ref 0.0–0.5)
Eosinophils Relative: 1 %
HCT: 32.2 % — ABNORMAL LOW (ref 36.0–46.0)
Hemoglobin: 11 g/dL — ABNORMAL LOW (ref 12.0–15.0)
Immature Granulocytes: 1 %
Lymphocytes Relative: 36 %
Lymphs Abs: 1.3 10*3/uL (ref 0.7–4.0)
MCH: 33 pg (ref 26.0–34.0)
MCHC: 34.2 g/dL (ref 30.0–36.0)
MCV: 96.7 fL (ref 80.0–100.0)
Monocytes Absolute: 0.4 10*3/uL (ref 0.1–1.0)
Monocytes Relative: 10 %
Neutro Abs: 1.8 10*3/uL (ref 1.7–7.7)
Neutrophils Relative %: 51 %
Platelets: 205 10*3/uL (ref 150–400)
RBC: 3.33 MIL/uL — ABNORMAL LOW (ref 3.87–5.11)
RDW: 14.3 % (ref 11.5–15.5)
WBC: 3.6 10*3/uL — ABNORMAL LOW (ref 4.0–10.5)
nRBC: 0 % (ref 0.0–0.2)

## 2021-06-04 LAB — COMPREHENSIVE METABOLIC PANEL
ALT: 22 U/L (ref 0–44)
AST: 29 U/L (ref 15–41)
Albumin: 2.9 g/dL — ABNORMAL LOW (ref 3.5–5.0)
Alkaline Phosphatase: 92 U/L (ref 38–126)
Anion gap: 10 (ref 5–15)
BUN: 15 mg/dL (ref 8–23)
CO2: 24 mmol/L (ref 22–32)
Calcium: 8.6 mg/dL — ABNORMAL LOW (ref 8.9–10.3)
Chloride: 100 mmol/L (ref 98–111)
Creatinine, Ser: 0.99 mg/dL (ref 0.44–1.00)
GFR, Estimated: 59 mL/min — ABNORMAL LOW (ref >60)
Glucose, Bld: 115 mg/dL — ABNORMAL HIGH (ref 70–99)
Potassium: 4.2 mmol/L (ref 3.5–5.1)
Sodium: 134 mmol/L — ABNORMAL LOW (ref 135–145)
Total Bilirubin: 0.8 mg/dL (ref 0.3–1.2)
Total Protein: 6 g/dL — ABNORMAL LOW (ref 6.5–8.1)

## 2021-06-04 MED ORDER — OXYCODONE HCL 5 MG PO TABS: 5.0000 mg | ORAL_TABLET | Freq: Four times a day (QID) | ORAL | 0 refills | Status: AC | PRN

## 2021-06-04 MED ORDER — LIDOCAINE 5 % EX PTCH: 1.0000 | MEDICATED_PATCH | Freq: Two times a day (BID) | CUTANEOUS | 0 refills | Status: AC | PRN

## 2021-06-04 MED ORDER — ONDANSETRON 4 MG PO TBDP: 4.0000 mg | ORAL_TABLET | Freq: Three times a day (TID) | ORAL | 0 refills | Status: DC | PRN

## 2021-06-04 MED ORDER — ONDANSETRON 4 MG PO TBDP
4.0000 mg | ORAL_TABLET | Freq: Once | ORAL | Status: AC
  Administered 2021-06-04: 4 mg via ORAL
  Filled 2021-06-04: qty 1

## 2021-06-04 MED ORDER — HYDROCODONE-ACETAMINOPHEN 5-325 MG PO TABS
1.0000 | ORAL_TABLET | Freq: Once | ORAL | Status: AC
Start: 2021-06-04 — End: 2021-06-04
  Administered 2021-06-04: 1 via ORAL
  Filled 2021-06-04: qty 1

## 2021-06-04 MED ORDER — LIDOCAINE 5 % EX PTCH
1.0000 | MEDICATED_PATCH | Freq: Once | CUTANEOUS | Status: DC
  Administered 2021-06-04: 1 via TRANSDERMAL
  Filled 2021-06-04: qty 1

## 2021-06-04 NOTE — Discharge Instructions (Addendum)
Take the pain medicine and nausea medicine as needed. Take Tylenol as needed for non-drowsy pain Follow-up with [facility] for surgical intervention. Return to the ED if needed.

## 2021-06-04 NOTE — ED Provider Notes (Signed)
[facility] Emergency Department Provider Note  ____________________________________________   Event Date/Time   First MD Initiated Contact with Patient 06/04/21 1955     (approximate)  I have reviewed the triage vital signs and the nursing notes.   HISTORY  Chief Complaint Back Pain  HPI Kelsey Carter is a 75 y.o. female with below medical history, presents to the ED for management of acute on chronic low back pain.  Patient presented today after she had been notified by her primary provider of the results of her recent lumbar MRI.  Patient was found to have a L2 compression fracture with 40% height loss.  She previously had been placed on a short course of hydrocodone, which she has completed, and notes ongoing pain patient denies any bladder or bowel incontinence, foot drop, or saddle anesthesia.  Patient had been evaluated by the PCP due to multiple falls resulting in ongoing back pain.  She reports that she was told that she would be a candidate for surgical intervention which likely will be a cemented injection to the affected compression fracture.  Past Medical History:  Diagnosis Date   Anemia    vitamin d deficiency   Anxiety    Arthritis    back, DDD   Breast cancer ([place])    bilateral mastectomies.  different types of cancer in each breast   Cardiomyopathy due to chemotherapy [facility]) 2011   Chronic pain 9935   Complication of anesthesia    Coronary artery disease    GERD (gastroesophageal reflux disease)    Hyperlipidemia    Hypertension    Lumbar herniated disc    Peripheral vascular disease (HCC)    neuropathies in extremities due to chemotherapy   Personal history of chemotherapy    Personal history of radiation therapy    PONV (postoperative nausea and vomiting)     Patient Active Problem List   Diagnosis Date Noted   Effort angina ([place]) 08/02/2019   Angina pectoris ([place]) 07/20/2019   H/O bilateral mastectomy 02/14/2019    Neuropathy due to chemotherapeutic drug ([place]) 02/14/2019   Anxiety 03/23/2018   Prediabetes 03/23/2018   DDD (degenerative disc disease), lumbar 03/02/2018   Chronic low back pain (Primary Area of Pain) (Bilateral) (L>R) with left-sided sciatica 02/15/2018   Chronic lower extremity pain (Secondary Area of Pain) (Left) 02/15/2018   Chronic pain syndrome 02/15/2018   Long term prescription benzodiazepine use 02/15/2018   Disorder of skeletal system 02/15/2018   Problems influencing health status 02/15/2018   Chronic sacroiliac joint pain 02/15/2018   Benign essential HTN 10/05/2017   Coronary artery disease involving native coronary artery of native heart 10/05/2017   Hyperlipidemia, mixed 10/05/2017   Elevated TSH 09/21/2017   Cardiomyopathy due to chemotherapy ([place]) 08/24/2017   Insomnia 08/24/2017   Peripheral neuropathy due to chemotherapy ([place]) 08/24/2017   Bilateral breast cancer ([place]) 08/24/2017   Onychomycosis 08/24/2017   Overweight (BMI 25.0-29.9) 08/24/2017   Vitamin D deficiency 08/24/2017   Hyperlipidemia 08/24/2017    Past Surgical History:  Procedure Laterality Date   APPENDECTOMY     BREAST IMPLANT REMOVAL Bilateral 2016   d/t infection. nothing replaced   CARDIAC CATHETERIZATION  2018   no blockages. pt thought it was an MI, but it wasn't   CESAREAN SECTION     x 3   CORONARY STENT INTERVENTION N/A 08/02/2019   Procedure: CORONARY STENT INTERVENTION;  Surgeon: Wellington Hampshire, MD;  Location: [place] [facility];  Service: Cardiovascular;  Laterality: N/A;   EYE SURGERY Bilateral 2013   cataract extractions with iol. cornea replacement also   INTRAVASCULAR PRESSURE WIRE/FFR STUDY N/A 08/02/2019   Procedure: INTRAVASCULAR PRESSURE WIRE/FFR STUDY;  Surgeon: Wellington Hampshire, MD;  Location: [place] [facility];  Service: Cardiovascular;  Laterality: N/A;   LEFT HEART CATH AND CORONARY ANGIOGRAPHY Left 07/27/2019   Procedure: LEFT HEART CATH AND CORONARY ANGIOGRAPHY;   Surgeon: Corey Skains, MD;  Location: [place] [facility];  Service: Cardiovascular;  Laterality: Left;   LUMBAR LAMINECTOMY/DECOMPRESSION MICRODISCECTOMY N/A 06/29/2018   Procedure: LUMBAR LAMINECTOMY/DECOMPRESSION MICRODISCECTOMY 1 LEVEL-L4-5, L5-S1 LATERAL DISCECTOMY;  Surgeon: Meade Maw, MD;  Location: [facility] ORS;  Service: Neurosurgery;  Laterality: N/A;   MASTECTOMY Bilateral 2011, 2016   5 diff procedures. reconstruction saline removed d/t infx   nsvd     x 1   PLACEMENT OF BREAST IMPLANTS Bilateral 2016   PORT-A-CATH REMOVAL  2016   TONSILLECTOMY      Prior to Admission medications   Medication Sig Start Date End Date Taking? Authorizing Provider  lidocaine (LIDODERM) 5 % Place 1 patch onto the skin every 12 (twelve) hours as needed for up to 10 days. Remove & Discard patch after 12 hours of wear each day. 06/04/21 06/14/21 Yes Marcio Hoque, Dannielle Karvonen, PA-C  ondansetron (ZOFRAN ODT) 4 MG disintegrating tablet Take 1 tablet (4 mg total) by mouth every 8 (eight) hours as needed. 06/04/21  Yes Asaiah Scarber, Dannielle Karvonen, PA-C  oxyCODONE (ROXICODONE) 5 MG immediate release tablet Take 1 tablet (5 mg total) by mouth every 6 (six) hours as needed for up to 5 days. 06/04/21 06/09/21 Yes Artis Buechele, Dannielle Karvonen, PA-C  alendronate (FOSAMAX) 70 MG tablet Take 1 tablet (70 mg total) by mouth once a week. Patient taking differently: Take 70 mg by mouth every Wednesday.  06/19/19   Lloyd Huger, MD  ALPRAZolam (XANAX) 1 MG tablet TAKE 1 TABLET(1 MG) BY MOUTH AT BEDTIME AS NEEDED FOR ANXIETY Patient taking differently: Take 1 mg by mouth at bedtime.  12/15/18   Sindy Guadeloupe, MD  aspirin EC 81 MG tablet Take 81 mg by mouth daily.    [provider]  atorvastatin (LIPITOR) 40 MG tablet Take 40 mg by mouth at bedtime.    [provider]  Cholecalciferol (VITAMIN D) 2000 units CAPS Take 1 capsule (2,000 Units total) by mouth daily. Patient taking differently: Take 4,000  capsules by mouth daily.  08/25/17   Plonk, Gwyndolyn Saxon, MD  MAGNESIUM PO Take 1 tablet by mouth daily.    [provider]  metoprolol succinate (TOPROL-XL) 50 MG 24 hr tablet Take 50 mg by mouth daily.  03/15/18   [provider]  Omega-3 Fatty Acids (FISH OIL) 1000 MG CAPS Take 1,000 mg by mouth daily.    [provider]  telmisartan (MICARDIS) 80 MG tablet Take 1 tablet (80 mg total) by mouth daily. 03/22/18   Plonk, Gwyndolyn Saxon, MD  vitamin C (ASCORBIC ACID) 250 MG tablet Take 500 mg by mouth daily.    [provider]    Allergies Hydrocodone-acetaminophen, Morphine and related, Ciprofloxacin, and Gabapentin  Family History  Problem Relation Age of Onset   Hypertension Mother    Cervical cancer Mother    Renal Disease Mother    Cancer Father    Breast cancer Paternal Aunt    Tuberculosis Maternal Grandfather    Breast cancer Paternal Grandmother     Social History Social History  Tobacco Use   Smoking status: Never   Smokeless tobacco: Never  Vaping Use   Vaping Use: Never used  Substance Use Topics   Alcohol use: No   Drug use: Yes    Comment: prescribed narcotics for back pain    Review of Systems  Constitutional: No fever/chills Eyes: No visual changes. ENT: No sore throat. Cardiovascular: Denies chest pain. Respiratory: Denies shortness of breath. Gastrointestinal: No abdominal pain.  No nausea, no vomiting.  No diarrhea.  No constipation. Genitourinary: Negative for dysuria. Musculoskeletal: Positive for back pain. Skin: Negative for rash. Neurological: Negative for headaches, focal weakness or numbness. ____________________________________________   PHYSICAL EXAM:  VITAL SIGNS: ED Triage Vitals  Enc Vitals Group     BP 06/04/21 1817 (!) 158/103     Pulse Rate 06/04/21 1817 97     Resp 06/04/21 1817 18     Temp 06/04/21 1817 99.4 F (37.4 C)     Temp Source 06/04/21 1817 Oral     SpO2 06/04/21 1817 97 %     Weight  06/04/21 1818 142 lb (64.4 kg)     Height 06/04/21 1818 5\' 5"  (1.651 m)     Head Circumference --      Peak Flow --      Pain Score 06/04/21 1818 8     Pain Loc --      Pain Edu? --      Excl. in [place]? --     Constitutional: Alert and oriented. Well appearing and in no acute distress. Eyes: Conjunctivae are normal. PERRL. EOMI. Head: Atraumatic. Cardiovascular: Normal rate, regular rhythm. Grossly normal heart sounds.  Good peripheral circulation. Respiratory: Normal respiratory effort.  No retractions. Lungs CTAB. Gastrointestinal: Soft and nontender. No distention. No abdominal bruits. No CVA tenderness. Musculoskeletal: No lower extremity tenderness nor edema.  No joint effusions. Neurologic: Cranial nerves II to XII grossly intact.  Normal LE DTRs.  Normal toe dorsiflexion on exam.  Normal speech and language. No gross focal neurologic deficits are appreciated.  Skin:  Skin is warm, dry and intact. No rash noted. Psychiatric: Mood and affect are normal. Speech and behavior are normal.  ____________________________________________   LABS (all labs ordered are listed, but only abnormal results are displayed)   ____________________________________________  EKG   ____________________________________________  RADIOLOGY I, Melvenia Needles, personally viewed and evaluated these images (plain radiographs) as part of my medical decision making, as well as reviewing the written report by the radiologist.  ED MD interpretation:  agree with report  Official radiology report(s): MR LUMBAR SPINE WO CONTRAST  Result Date: 06/04/2021 CLINICAL DATA:  Recent falls. Low back pain. History of back surgery 3 years ago EXAM: MRI LUMBAR SPINE WITHOUT CONTRAST TECHNIQUE: Multiplanar, multisequence MR imaging of the lumbar spine was performed. No intravenous contrast was administered. COMPARISON:  Lumbar MRI 04/06/2018 FINDINGS: Segmentation:  Normal Alignment: Mild retrolisthesis L2-3,  L3-4. Mild anterolisthesis L4-5. Mild to moderate lumbar levoscoliosis with progression from the prior MRI. Vertebrae: Compression fracture L2 vertebral body. Approximately 40% loss of vertebral body height. There is diffuse edema in the inferior endplate. The superior endplate fracture shows minimal edema and could be related to prior fracture. This fracture was not present in 2019. No other fracture or mass identified. Conus medullaris and cauda equina: Conus extends to the L2 level. Conus and cauda equina appear normal. Paraspinal and other soft tissues: Negative for paraspinous mass or adenopathy. Bilateral renal cysts. Atherosclerotic aorta without aneurysm. Disc levels: T11-12: Central disc  protrusion without significant stenosis. T12-L1: Central and left-sided disc protrusion without stenosis L1-2: Shallow central and left-sided disc protrusion without stenosis L2-3: Moderate disc degeneration with disc space narrowing. Broad-based disc protrusion. Moderate subarticular stenosis bilaterally. Mild narrowing of the spinal canal. L3-4: Moderate to advanced disc degeneration with disc space narrowing and spurring. Broad-based disc protrusion is predominately right-sided. There is moderate subarticular and foraminal stenosis on the right. Mild stenosis on the left. L4-5: Severe right foraminal encroachment due to disc protrusion and spurring. This is similar to the prior MRI. Progression of central and left-sided disc protrusion since the prior study with moderate left subarticular stenosis. Bilateral facet degeneration. Spinal canal adequate in size. L5-S1: Moderate to severe subarticular and foraminal stenosis on the left with impingement of the left L5 nerve root similar to the prior MRI. Left foraminal disc protrusion and bilateral facet degeneration unchanged. IMPRESSION: Acute compression fracture L2 vertebral body with 40% loss of vertebral body height. No other fracture. Multilevel spondylosis causing  foraminal stenosis bilaterally as detailed above. Electronically Signed   By: Franchot Gallo M.D.   On: 06/04/2021 08:46    ____________________________________________   PROCEDURES  Procedure(s) performed (including Critical Care):  Procedures  Zofran 4 mg ODT Hydrocodone 5-325 mg p.o. Lidoderm patch 5% topically  ____________________________________________   INITIAL IMPRESSION / ASSESSMENT AND PLAN / ED COURSE  As part of my medical decision making, I reviewed the following data within the [facility] chart reviewed, Radiograph reviewed as above, Notes from prior ED visits, and [place] Controlled Substance Database   Geriatric patient ED evaluation of acute on chronic low back pain, who was recently confirmed to have an L2 compression fracture.  Patient presents for pain relief.  She was evaluated in the ED without any signs of any acute or muscle deficit or red flags.  Patient is discharged with a prescription for Lidoderm patch, oxycodone, and Zofran.  She is encouraged to contact her Ortho provider for surgical scheduling.  Return precautions have been discussed. ____________________________________________   FINAL CLINICAL IMPRESSION(S) / ED DIAGNOSES  Final diagnoses:  Compression fracture of L2 vertebra, initial encounter ([place])  DDD (degenerative disc disease), lumbosacral     ED Discharge Orders          Ordered    lidocaine (LIDODERM) 5 %  Every 12 hours PRN        06/04/21 2017    oxyCODONE (ROXICODONE) 5 MG immediate release tablet  Every 6 hours PRN        06/04/21 2017    ondansetron (ZOFRAN ODT) 4 MG disintegrating tablet  Every 8 hours PRN        06/04/21 2017             Note:  This document was prepared using Dragon voice recognition software and may include unintentional dictation errors. good   Deya Bigos, Dannielle Karvonen, Hershal Coria 06/04/21 2041    Nance Pear, MD 06/04/21 2125

## 2021-06-04 NOTE — ED Notes (Signed)
Patient provided with walker

## 2021-06-04 NOTE — ED Triage Notes (Signed)
Pt comes into the ED via [facility] from home c/o back pain that has been ongoing x 2 weeks.  Pt went to the MD this morning for her MRI which showed fractures.  Pt unsure where the fractures are.  Pt states the pain is 8/10 and it is getting worse.  Pt is out of her hydrocodone medication since yesterday.  Pt also c/o nausea and vomiting. Pt was ambulatory upon EMS arrival.  VS:101 HR, 97% ra, 182/104

## 2021-06-11 ENCOUNTER — Other Ambulatory Visit: Payer: Self-pay | Admitting: Orthopedic Surgery

## 2021-06-11 DIAGNOSIS — S32020A Wedge compression fracture of second lumbar vertebra, initial encounter for closed fracture: Secondary | ICD-10-CM | POA: Diagnosis not present

## 2021-06-13 ENCOUNTER — Other Ambulatory Visit
Admission: RE | Admit: 2021-06-13 | Discharge: 2021-06-13 | Disposition: A | Discharge status: Home / Self Care | Payer: PPO | Source: Ambulatory Visit | Attending: Orthopedic Surgery | Admitting: Orthopedic Surgery

## 2021-06-13 ENCOUNTER — Other Ambulatory Visit: Payer: Self-pay

## 2021-06-13 ENCOUNTER — Encounter: Payer: Self-pay | Admitting: Orthopedic Surgery

## 2021-06-13 NOTE — Patient Instructions (Addendum)
Your procedure is scheduled on: Tuesday June 17, 2021. Report to Day Surgery inside [place] 2nd floor (stop by admissions desk before getting on elevator) To find out your arrival time please call [phone] between 1PM - 3PM on Monday June 16, 2021.  Remember: Instructions that are not followed completely may result in serious medical risk,  up to and including death, or upon the discretion of your surgeon and anesthesiologist your  surgery may need to be rescheduled.     _X__ 1. Do not eat food after midnight the night before your procedure.                 No chewing gum or hard candies. You may drink clear liquids up to 2 hours                 before you are scheduled to arrive for your surgery- DO not drink clear                 liquids within 2 hours of the start of your surgery.                 Clear Liquids include:  water, apple juice without pulp, clear Gatorade, G2 or                  Gatorade Zero (avoid Red/Blue), Black Coffee or Tea (Do not add                 anything to coffee or tea).  __X__2.   Complete the "Ensure Clear Pre-surgery Clear Carbohydrate Drink" provided to you, 2 hours before arrival. **If you are diabetic you will be provided with an alternative drink, Gatorade Zero or G2.  __X__3.  On the morning of surgery brush your teeth with toothpaste and water, you                may rinse your mouth with mouthwash if you wish.  Do not swallow any toothpaste of mouthwash.     _X__ 4.  No Alcohol for 24 hours before or after surgery.   _X__ 5.  Do Not Smoke or use e-cigarettes For 24 Hours Prior to Your Surgery.                 Do not use any chewable tobacco products for at least 6 hours prior to                 Surgery.  _X__  6.  Do not use any recreational drugs (marijuana, cocaine, heroin, ecstasy, MDMA or other)                For at least one week prior to your surgery.  Combination of these drugs with anesthesia                 May have life threatening results.  __X__7.  Notify your doctor if there is any change in your medical condition      (cold, fever, infections).     Do not wear jewelry, make-up, hairpins, clips or nail polish. Do not wear lotions, powders, or perfumes. You may wear deodorant. Do not shave 48 hours prior to surgery. Men may shave face and neck. Do not bring valuables to the hospital.    [facility] is not responsible for any belongings or valuables.  Contacts, dentures or bridgework may not be worn into surgery. Leave your suitcase in the car. After surgery it may  be brought to your room. For patients admitted to the hospital, discharge time is determined by your treatment team.   Patients discharged the day of surgery will not be allowed to drive home.   Make arrangements for someone to be with you for the first 24 hours of your Same Day Discharge.  __X__ Take these medicines the morning of surgery with A SIP OF WATER:    1. levothyroxine (SYNTHROID) 25 MCG  2. metoprolol succinate (TOPROL-XL) 50 MG  3. amiodarone (PACERONE) 200 MG  4.  5.  6.  ____ Fleet Enema (as directed)   __X__ Use CHG Soap (or wipes) as directed  ____ Use Benzoyl Peroxide Gel as instructed  ____ Use inhalers on the day of surgery  ____ Stop metformin 2 days prior to surgery    ____ Take 1/2 of usual insulin dose the night before surgery. No insulin the morning          of surgery.   __X _ Stop ELIQUIS 5 MG TABS as instructed by your doctor before your surgery.   __X__ One Week prior to surgery- Stop Anti-inflammatories such as Ibuprofen, Aleve, Advil, Motrin, meloxicam (MOBIC), diclofenac, etodolac, ketorolac, Toradol, Daypro, piroxicam, Goody's or BC powders. OK TO USE TYLENOL IF NEEDED   __X__ Stop supplements until after surgery.    ____ Bring C-Pap to the hospital.    If you have any questions regarding your pre-procedure instructions,  Please call Pre-admit Testing at [phone]

## 2021-06-13 NOTE — Progress Notes (Signed)
Perioperative Services  Pre-Admission/Anesthesia Testing Clinical Review  Date: 06/16/21  Patient Demographics:  Name: Kelsey Carter DOB:   1946/01/09 MRN:   [id number]  Planned Surgical Procedure(s):     NOTE: Available PAT nursing documentation and vital signs have been reviewed. Clinical nursing staff has updated patient's PMH/PSHx, current medication list, and drug allergies/intolerances to ensure comprehensive history available to assist in medical decision making as it pertains to the aforementioned surgical procedure and anticipated anesthetic course. Extensive review of available clinical information performed. [place] PMH and PSHx updated with any diagnoses/procedures that  may have been inadvertently omitted during her intake with the pre-admission testing department's nursing staff.  Clinical Discussion:  Kelsey Carter is a 75 y.o. female who is submitted for pre-surgical anesthesia review and clearance prior to her undergoing the above procedure. Patient has never been a smoker. Pertinent PMH includes: CAD, paroxysmal atrial fibrillation, mild valvular insufficiency, first-degree AV block, chemotherapy-induced cardiomyopathy, HTN, HLD, GERD (no daily Tx), lumbar DDD, anemia, breast cancer (s/p bilateral mastectomies), anxiety  Patient is followed by cardiology Nehemiah Massed, MD). She was last seen in the cardiology clinic on 01/20/2021; notes reviewed.  At the time of her clinic visit, patient doing well overall from a cardiovascular perspective.  She denied any chest pain, shortness breath, PND, orthopnea, palpitations, significant peripheral edema, vertiginous symptoms, or presyncope/syncope.  Patient with a PMH significant for cardiovascular diagnoses.  TTE performed on 10/18/2017 revealed globally normal ventricular systolic function, mild LVH, and mild valvular insufficiency.  There was no evidence of valvular stenosis.  LVEF low normal at 50%.  Repeat TTE in  01/16/2020 remained grossly unchanged.  Stress echocardiogram performed on 07/19/2019 revealed an LVEF of 55%.  There were reversible apical anteroseptal wall motion abnormalities noted.  Patient underwent diagnostic left heart catheterization on 07/27/2019 that revealed significant stenosis of the ostial to proximal LAD. Additionally, there was nonobstructive disease (40% stenosis) of the proximal RCA and 30% stenosis of the 1st marginal.   Intravascular pressure wire/FFR study performed on 08/02/2019.  FFR of the LAD revealed significant stenosis with a DFR ratio of 0.85.  Again, there was 75% stenosis of the proximal LAD, 50% stenosis of the mid LAD, 40% stenosis of the ostial LAD, and 30% stenosis of the 1st marginal.  DES x 1 placed to the proximal LAD.   Myocardial perfusion imaging study performed on 01/10/2020 revealed an LVEF of 55%.  There was no evidence of stress-induced myocardial ischemia or arrhythmia (see full interpretation of cardiovascular testing and intervention below).  Patient with a history of paroxysmal atrial fibrillation. CHA2DS2-VASc Score = 4 (age x 2, sex, HTN). Patient chronically anticoagulated using apixaban; compliant with therapy with no evidence of GI bleeding.  Rate and rhythm controlled on amiodarone.  Blood pressure well controlled on currently prescribed beta-blocker monotherapy.  Patient is on a statin for HLD.  Functional capacity, as defined by DASI, is documented as being >/= 4 METS.  No changes were made to patient's medication regimen.  Patient to follow-up with outpatient cardiology in 9 months or sooner if needed.  Patient is scheduled for an L2 kyphoplasty on 06/17/2021 with Dr. Hessie Knows, MD.  Given patient's past medical history significant for cardiovascular diagnoses, presurgical cardiac clearance was sought by the PAT team. Per cardiology (verbal), "this patient is optimized for surgery and may proceed with the planned procedural course with a  ACCEPTABLE risk stratification". Again, this patient is on daily anticoagulation therapy. She has been instructed on recommendations for  holding her daily apixaban for 3 days prior to her procedure with plans to restart as soon as postoperative bleeding risk felt to be minimized by her attending surgeon. The patient has been instructed that her last dose of her anticoagulant will be on 06/13/2021.  Patient denies previous perioperative complications with anesthesia in the past.  Review of medical record indicates that patient has a history of PONV, however she adamantly denied this and asked for this information to be removed from her record. In review of the available records, it is noted that patient underwent a general anesthetic course here (ASA III) in 06/2018 without documented complications.   Vitals with BMI 06/13/2021 06/04/2021 02/06/2020  Height 5\' 5"  5\' 5"  5\' 4"   Weight 145 lbs 142 lbs 160 lbs  BMI 24.13 23.30 07.62  Systolic - 263 335  Diastolic - 456 84  Pulse - 97 78    Providers/Specialists:   NOTE: Primary physician provider listed below. Patient may have been seen by APP or partner within same practice.   PROVIDER ROLE / SPECIALTY LAST Fabio Bering, MD  Orthopedics (Surgeon) 06/11/2021  Tracie Harrier, MD  Primary Care Provider 05/23/2021  Serafina Royals, MD  Cardiology 01/20/2021   Allergies:  Hydrocodone-acetaminophen, Morphine and related, Ciprofloxacin, and Gabapentin  Current Home Medications:   No current facility-administered medications for this encounter.    amiodarone (PACERONE) 200 MG tablet   Cholecalciferol (VITAMIN D3 PO)   ELIQUIS 5 MG TABS tablet   levothyroxine (SYNTHROID) 25 MCG tablet   MAGNESIUM PO   metoprolol succinate (TOPROL-XL) 50 MG 24 hr tablet   ondansetron (ZOFRAN ODT) 4 MG disintegrating tablet   History:   Past Medical History:  Diagnosis Date   Anemia    vitamin d deficiency   Anxiety    Breast cancer (HCC)     bilateral mastectomies.  different types of cancer in each breast   Cardiomyopathy due to chemotherapy [facility]) 2011   Chronic anticoagulation    Apixaban   Chronic pain 2019   Chronic SI joint pain    Coronary artery disease    DDD (degenerative disc disease), lumbar    First degree AV block    GERD (gastroesophageal reflux disease)    Hyperlipidemia    Hypertension    Insomnia    Lumbar herniated disc    Neuropathy    Paroxysmal atrial fibrillation (HCC)    Personal history of chemotherapy    Personal history of radiation therapy    S/P drug eluting coronary stent placement 08/02/2019   2.5 x 12 mm Resolute Onyx to pLAD   Valvular insufficiency    Mild   Past Surgical History:  Procedure Laterality Date   APPENDECTOMY  1961   BREAST IMPLANT REMOVAL Bilateral 2016   d/t infection. nothing replaced   CARDIAC CATHETERIZATION  2018   no blockages. pt thought it was an MI, but it wasn't   CESAREAN SECTION     x 3   CORONARY STENT INTERVENTION N/A 08/02/2019   Procedure: CORONARY STENT INTERVENTION;  Surgeon: Wellington Hampshire, MD;  Location: [place] [facility];  Service: Cardiovascular;  Laterality: N/A;   EYE SURGERY Bilateral 2013   cataract extractions with iol. cornea replacement also   INTRAVASCULAR PRESSURE WIRE/FFR STUDY N/A 08/02/2019   Procedure: INTRAVASCULAR PRESSURE WIRE/FFR STUDY;  Surgeon: Wellington Hampshire, MD;  Location: [place] [facility];  Service: Cardiovascular;  Laterality: N/A;   LEFT HEART CATH AND CORONARY ANGIOGRAPHY Left 07/27/2019  Procedure: LEFT HEART CATH AND CORONARY ANGIOGRAPHY;  Surgeon: Corey Skains, MD;  Location: [place] [facility];  Service: Cardiovascular;  Laterality: Left;   LUMBAR LAMINECTOMY/DECOMPRESSION MICRODISCECTOMY N/A 06/29/2018   Procedure: LUMBAR LAMINECTOMY/DECOMPRESSION MICRODISCECTOMY 1 LEVEL-L4-5, L5-S1 LATERAL DISCECTOMY;  Surgeon: Meade Maw, MD;  Location: [facility];  Service: Neurosurgery;  Laterality: N/A;    MASTECTOMY Bilateral 2011, 2016   5 diff procedures. reconstruction saline removed d/t infx   nsvd     x 1   PLACEMENT OF BREAST IMPLANTS Bilateral 2016   PORT-A-CATH REMOVAL  2016   TONSILLECTOMY     Family History  Problem Relation Age of Onset   Hypertension Mother    Cervical cancer Mother    Renal Disease Mother    Cancer Father    Breast cancer Paternal Aunt    Tuberculosis Maternal Grandfather    Breast cancer Paternal Grandmother    Social History   Tobacco Use   Smoking status: Never   Smokeless tobacco: Never  Vaping Use   Vaping Use: Never used  Substance Use Topics   Alcohol use: No   Drug use: Yes    Comment: prescribed narcotics for back pain    Pertinent Clinical Results:  LABS: Labs reviewed: Acceptable for surgery.  No visits with results within 3 Day(s) from this visit.  Latest known visit with results is:  Admission on 06/04/2021, Discharged on 06/04/2021  Component Date Value Ref Range Status   WBC 06/04/2021 3.6 (A) 4.0 - 10.5 K/uL Final   RBC 06/04/2021 3.33 (A) 3.87 - 5.11 MIL/uL Final   Hemoglobin 06/04/2021 11.0 (A) 12.0 - 15.0 g/dL Final   HCT 06/04/2021 32.2 (A) 36.0 - 46.0 % Final   MCV 06/04/2021 96.7  80.0 - 100.0 fL Final   MCH 06/04/2021 33.0  26.0 - 34.0 pg Final   MCHC 06/04/2021 34.2  30.0 - 36.0 g/dL Final   RDW 06/04/2021 14.3  11.5 - 15.5 % Final   Platelets 06/04/2021 205  150 - 400 K/uL Final   nRBC 06/04/2021 0.0  0.0 - 0.2 % Final   Neutrophils Relative % 06/04/2021 51  % Final   Neutro Abs 06/04/2021 1.8  1.7 - 7.7 K/uL Final   Lymphocytes Relative 06/04/2021 36  % Final   Lymphs Abs 06/04/2021 1.3  0.7 - 4.0 K/uL Final   Monocytes Relative 06/04/2021 10  % Final   Monocytes Absolute 06/04/2021 0.4  0.1 - 1.0 K/uL Final   Eosinophils Relative 06/04/2021 1  % Final   Eosinophils Absolute 06/04/2021 0.1  0.0 - 0.5 K/uL Final   Basophils Relative 06/04/2021 1  % Final   Basophils Absolute 06/04/2021 0.0  0.0 - 0.1  K/uL Final   Immature Granulocytes 06/04/2021 1  % Final   Abs Immature Granulocytes 06/04/2021 0.04  0.00 - 0.07 K/uL Final   Performed at [facility], [address] [place], [place] [address]   Sodium 06/04/2021 134 (A) 135 - 145 mmol/L Final   Potassium 06/04/2021 4.2  3.5 - 5.1 mmol/L Final   Chloride 06/04/2021 100  98 - 111 mmol/L Final   CO2 06/04/2021 24  22 - 32 mmol/L Final   Glucose, Bld 06/04/2021 115 (A) 70 - 99 mg/dL Final   Glucose reference range applies only to samples taken after fasting for at least 8 hours.   BUN 06/04/2021 15  8 - 23 mg/dL Final   Creatinine, Ser 06/04/2021 0.99  0.44 - 1.00 mg/dL Final  Calcium 06/04/2021 8.6 (A) 8.9 - 10.3 mg/dL Final   Total Protein 06/04/2021 6.0 (A) 6.5 - 8.1 g/dL Final   Albumin 06/04/2021 2.9 (A) 3.5 - 5.0 g/dL Final   AST 06/04/2021 29  15 - 41 U/L Final   ALT 06/04/2021 22  0 - 44 U/L Final   Alkaline Phosphatase 06/04/2021 92  38 - 126 U/L Final   Total Bilirubin 06/04/2021 0.8  0.3 - 1.2 mg/dL Final   GFR, Estimated 06/04/2021 59 (A) >60 mL/min Final   Comment: (NOTE) Calculated using the CKD-EPI Creatinine Equation (2021)    Anion gap 06/04/2021 10  5 - 15 Final   Performed at [facility], [address] [place], [place] [address]    ECG: Date: 01/20/2021 Rate: 66 bpm Rhythm:  Sinus rhythm with first-degree AV block Axis (leads I and aVF): Left axis deviation Intervals: PR 232 ms. QRS 104 ms. QTc 434 ms. ST segment and T wave changes: No evidence of acute ST segment elevation or depression.  Evidence of an age undetermined septal infarct present. Comparison: Similar to previous tracing obtained on 08/02/2019   IMAGING / PROCEDURES: MRI LUMBAR WITHOUT CONTRAST PERFORMED ON 06/04/2021 Acute compression fracture of the L2 vertebral body with 40% height loss. No other fracture Multilevel spondylosis causing foraminal stenosis bilaterally  LEXISCAN performed on 01/18/2020 LVEF  55% Regional wall motion reveals normal myocardial thickening and wall motion No artifacts noted Left ventricular cavity size normal No evidence of stress-induced myocardial ischemia or arrhythmia The overall quality of the study is good  TRANSTHORACIC ECHOCARDIOGRAM performed on 01/16/2020 LVEF 50% Normal left ventricular systolic function Normal right ventricular systolic function Mild MR and TR Trivial PR No AR No valvular stenosis No evidence of pericardial effusion  INTRAVASCULAR PRESSURE WIRE/FFR STUDY WITH CORONARY STENT INTERVENTION performed on 08/02/2019 Flow reserve evaluation of the LAD revealed significant stenosis with a DFR ratio of 0.85 CAD 30% stenosis of the 1st marginal 40% stenosis of the ostial LAD 50% stenosis of the mid LAD 75% stenosis of the proximal LAD Successful angioplasty and DES placement to the proximal LAD 2.5 x 12 mm Resolute Onyx DES x 1 placed  There was moderate ostial and mid LAD disease that was left to be treated medically  LEFT HEART CATHETERIZATION AND CORONARY ANGIOGRAPHY performed on 07/27/2019 LVEF normal at 55% CAD 40% stenosis of the proximal RCA  75% stenosis of the ostial to proximal LAD 30% stenosis of the 1st marginal Recommendations High intensity cholesterol therapy DAPT therapy Antihypertensive medication as before PCI and stent placement of proximal left anterior descending artery  Impression and Plan:  Kelsey Carter has been referred for pre-anesthesia review and clearance prior to her undergoing the planned anesthetic and procedural courses. Available labs, pertinent testing, and imaging results were personally reviewed by me. This patient has been appropriately cleared by cardiology with an overall ACCEPTABLE risk of significant perioperative cardiovascular complications.  Based on clinical review performed today (06/16/21), barring any significant acute changes in the patient's overall condition, it is  anticipated that she will be able to proceed with the planned surgical intervention. Any acute changes in clinical condition may necessitate her procedure being postponed and/or cancelled. Patient will meet with anesthesia team (MD and/or CRNA) on the day of her procedure for preoperative evaluation/assessment. Questions regarding anesthetic course will be fielded at that time.   Pre-surgical instructions were reviewed with the patient during her PAT appointment and questions were fielded by PAT clinical staff. Patient was  advised that if any questions or concerns arise prior to her procedure then she should return a call to PAT and/or her surgeon's office to discuss.  Honor Loh, MSN, APRN, FNP-C, CEN [facility]  Peri-operative Services Nurse Practitioner Phone: [phone] Fax: [phone] 06/16/21 9:34 AM  NOTE: This note has been prepared using Dragon dictation software. Despite my best ability to proofread, there is always the potential that unintentional transcriptional errors may still occur from this process.

## 2021-06-17 ENCOUNTER — Encounter
Admission: RE | Disposition: A | Discharge status: Home / Self Care | Payer: Self-pay | Source: Home / Self Care | Attending: Orthopedic Surgery

## 2021-06-17 ENCOUNTER — Ambulatory Visit: Payer: PPO | Admitting: Urgent Care

## 2021-06-17 ENCOUNTER — Ambulatory Visit: Payer: PPO

## 2021-06-17 ENCOUNTER — Other Ambulatory Visit: Payer: Self-pay

## 2021-06-17 ENCOUNTER — Encounter: Payer: Self-pay | Admitting: Orthopedic Surgery

## 2021-06-17 ENCOUNTER — Ambulatory Visit
Admission: RE | Admit: 2021-06-17 | Discharge: 2021-06-17 | Disposition: A | Discharge status: Home / Self Care | Payer: PPO | Attending: Orthopedic Surgery | Admitting: Orthopedic Surgery

## 2021-06-17 DIAGNOSIS — Z888 Allergy status to other drugs, medicaments and biological substances status: Secondary | ICD-10-CM | POA: Insufficient documentation

## 2021-06-17 DIAGNOSIS — M4856XA Collapsed vertebra, not elsewhere classified, lumbar region, initial encounter for fracture: Secondary | ICD-10-CM | POA: Diagnosis not present

## 2021-06-17 DIAGNOSIS — M4807 Spinal stenosis, lumbosacral region: Secondary | ICD-10-CM | POA: Insufficient documentation

## 2021-06-17 DIAGNOSIS — Z7901 Long term (current) use of anticoagulants: Secondary | ICD-10-CM | POA: Insufficient documentation

## 2021-06-17 DIAGNOSIS — Z885 Allergy status to narcotic agent status: Secondary | ICD-10-CM | POA: Insufficient documentation

## 2021-06-17 DIAGNOSIS — Z419 Encounter for procedure for purposes other than remedying health state, unspecified: Secondary | ICD-10-CM

## 2021-06-17 DIAGNOSIS — W19XXXA Unspecified fall, initial encounter: Secondary | ICD-10-CM | POA: Diagnosis not present

## 2021-06-17 DIAGNOSIS — Y939 Activity, unspecified: Secondary | ICD-10-CM | POA: Diagnosis not present

## 2021-06-17 DIAGNOSIS — Z881 Allergy status to other antibiotic agents status: Secondary | ICD-10-CM | POA: Insufficient documentation

## 2021-06-17 DIAGNOSIS — Z7951 Long term (current) use of inhaled steroids: Secondary | ICD-10-CM | POA: Insufficient documentation

## 2021-06-17 DIAGNOSIS — M8088XA Other osteoporosis with current pathological fracture, vertebra(e), initial encounter for fracture: Secondary | ICD-10-CM | POA: Insufficient documentation

## 2021-06-17 DIAGNOSIS — Z79899 Other long term (current) drug therapy: Secondary | ICD-10-CM | POA: Insufficient documentation

## 2021-06-17 DIAGNOSIS — M48061 Spinal stenosis, lumbar region without neurogenic claudication: Secondary | ICD-10-CM | POA: Diagnosis not present

## 2021-06-17 DIAGNOSIS — Z8249 Family history of ischemic heart disease and other diseases of the circulatory system: Secondary | ICD-10-CM | POA: Insufficient documentation

## 2021-06-17 DIAGNOSIS — Z853 Personal history of malignant neoplasm of breast: Secondary | ICD-10-CM | POA: Diagnosis not present

## 2021-06-17 DIAGNOSIS — Z955 Presence of coronary angioplasty implant and graft: Secondary | ICD-10-CM | POA: Diagnosis not present

## 2021-06-17 DIAGNOSIS — S32020A Wedge compression fracture of second lumbar vertebra, initial encounter for closed fracture: Secondary | ICD-10-CM | POA: Insufficient documentation

## 2021-06-17 DIAGNOSIS — D649 Anemia, unspecified: Secondary | ICD-10-CM | POA: Diagnosis not present

## 2021-06-17 HISTORY — DX: Polyneuropathy, unspecified: G62.9

## 2021-06-17 HISTORY — DX: Long term (current) use of anticoagulants: Z79.01

## 2021-06-17 HISTORY — PX: KYPHOPLASTY: SHX5884

## 2021-06-17 HISTORY — DX: Atrioventricular block, first degree: I44.0

## 2021-06-17 HISTORY — DX: Other intervertebral disc degeneration, lumbar region: M51.36

## 2021-06-17 HISTORY — DX: Other chronic pain: G89.29

## 2021-06-17 HISTORY — DX: Other intervertebral disc degeneration, lumbar region without mention of lumbar back pain or lower extremity pain: M51.369

## 2021-06-17 HISTORY — DX: Insomnia, unspecified: G47.00

## 2021-06-17 HISTORY — DX: Endocarditis, valve unspecified: I38

## 2021-06-17 HISTORY — DX: Paroxysmal atrial fibrillation: I48.0

## 2021-06-17 SURGERY — KYPHOPLASTY
Anesthesia: General

## 2021-06-17 MED ORDER — ONDANSETRON HCL 4 MG/2ML IJ SOLN
INTRAMUSCULAR | Status: DC | PRN
  Administered 2021-06-17: 4 mg via INTRAVENOUS

## 2021-06-17 MED ORDER — ORAL CARE MOUTH RINSE: 15.0000 mL | Freq: Once | OROMUCOSAL | Status: DC

## 2021-06-17 MED ORDER — CHLORHEXIDINE GLUCONATE 0.12 % MT SOLN
OROMUCOSAL | Status: AC
  Filled 2021-06-17: qty 15

## 2021-06-17 MED ORDER — LIDOCAINE HCL (PF) 1 % IJ SOLN
INTRAMUSCULAR | Status: AC
  Filled 2021-06-17: qty 30

## 2021-06-17 MED ORDER — LACTATED RINGERS IV SOLN: INTRAVENOUS | Status: DC | PRN

## 2021-06-17 MED ORDER — PROPOFOL 500 MG/50ML IV EMUL
INTRAVENOUS | Status: DC | PRN
  Administered 2021-06-17: 25 ug/kg/min via INTRAVENOUS

## 2021-06-17 MED ORDER — METOCLOPRAMIDE HCL 10 MG PO TABS: 5.0000 mg | ORAL_TABLET | Freq: Three times a day (TID) | ORAL | Status: DC | PRN

## 2021-06-17 MED ORDER — LIDOCAINE HCL 1 % IJ SOLN
INTRAMUSCULAR | Status: DC | PRN
  Administered 2021-06-17: 10 mL
  Administered 2021-06-17: 20 mL

## 2021-06-17 MED ORDER — 0.9 % SODIUM CHLORIDE (POUR BTL) OPTIME
TOPICAL | Status: DC | PRN
  Administered 2021-06-17: 500 mL

## 2021-06-17 MED ORDER — ONDANSETRON HCL 4 MG/2ML IJ SOLN: 4.0000 mg | Freq: Once | INTRAMUSCULAR | Status: AC | PRN

## 2021-06-17 MED ORDER — BUPIVACAINE-EPINEPHRINE (PF) 0.5% -1:200000 IJ SOLN
INTRAMUSCULAR | Status: DC | PRN
  Administered 2021-06-17: 20 mL via PERINEURAL

## 2021-06-17 MED ORDER — ONDANSETRON HCL 4 MG PO TABS: 4.0000 mg | ORAL_TABLET | Freq: Four times a day (QID) | ORAL | Status: DC | PRN

## 2021-06-17 MED ORDER — TRAMADOL HCL 50 MG PO TABS: 50.0000 mg | ORAL_TABLET | Freq: Four times a day (QID) | ORAL | 0 refills | Status: DC | PRN

## 2021-06-17 MED ORDER — CEFAZOLIN SODIUM-DEXTROSE 2-4 GM/100ML-% IV SOLN
INTRAVENOUS | Status: AC
  Filled 2021-06-17: qty 100

## 2021-06-17 MED ORDER — FENTANYL CITRATE (PF) 100 MCG/2ML IJ SOLN
INTRAMUSCULAR | Status: AC
  Filled 2021-06-17: qty 2

## 2021-06-17 MED ORDER — FENTANYL CITRATE (PF) 100 MCG/2ML IJ SOLN: 25.0000 ug | INTRAMUSCULAR | Status: DC | PRN

## 2021-06-17 MED ORDER — CHLORHEXIDINE GLUCONATE 0.12 % MT SOLN: 15.0000 mL | Freq: Once | OROMUCOSAL | Status: DC

## 2021-06-17 MED ORDER — BUPIVACAINE-EPINEPHRINE (PF) 0.5% -1:200000 IJ SOLN
INTRAMUSCULAR | Status: AC
  Filled 2021-06-17: qty 30

## 2021-06-17 MED ORDER — LACTATED RINGERS IV SOLN: INTRAVENOUS | Status: DC

## 2021-06-17 MED ORDER — SODIUM CHLORIDE 0.9 % IV SOLN: INTRAVENOUS | Status: DC

## 2021-06-17 MED ORDER — FAMOTIDINE 20 MG PO TABS
ORAL_TABLET | ORAL | Status: AC
  Filled 2021-06-17: qty 1

## 2021-06-17 MED ORDER — ONDANSETRON HCL 4 MG/2ML IJ SOLN: 4.0000 mg | Freq: Four times a day (QID) | INTRAMUSCULAR | Status: DC | PRN

## 2021-06-17 MED ORDER — IOHEXOL 180 MG/ML  SOLN
INTRAMUSCULAR | Status: DC | PRN
  Administered 2021-06-17: 20 mL

## 2021-06-17 MED ORDER — DEXAMETHASONE SODIUM PHOSPHATE 10 MG/ML IJ SOLN
INTRAMUSCULAR | Status: DC | PRN
  Administered 2021-06-17: 10 mg via INTRAVENOUS

## 2021-06-17 MED ORDER — METOCLOPRAMIDE HCL 5 MG/ML IJ SOLN
INTRAMUSCULAR | Status: AC
  Filled 2021-06-17: qty 2

## 2021-06-17 MED ORDER — FENTANYL CITRATE (PF) 100 MCG/2ML IJ SOLN
INTRAMUSCULAR | Status: DC | PRN
  Administered 2021-06-17 (×3): 25 ug via INTRAVENOUS

## 2021-06-17 MED ORDER — MIDAZOLAM HCL 2 MG/2ML IJ SOLN
INTRAMUSCULAR | Status: AC
  Filled 2021-06-17: qty 2

## 2021-06-17 MED ORDER — MIDAZOLAM HCL 2 MG/2ML IJ SOLN
INTRAMUSCULAR | Status: DC | PRN
  Administered 2021-06-17 (×2): 1 mg via INTRAVENOUS

## 2021-06-17 MED ORDER — METOCLOPRAMIDE HCL 5 MG/ML IJ SOLN
5.0000 mg | Freq: Three times a day (TID) | INTRAMUSCULAR | Status: DC | PRN
  Administered 2021-06-17: 10 mg via INTRAVENOUS

## 2021-06-17 MED ORDER — ONDANSETRON HCL 4 MG/2ML IJ SOLN
INTRAMUSCULAR | Status: AC
  Administered 2021-06-17: 4 mg via INTRAVENOUS
  Filled 2021-06-17: qty 2

## 2021-06-17 MED ORDER — CEFAZOLIN SODIUM-DEXTROSE 2-4 GM/100ML-% IV SOLN
2.0000 g | INTRAVENOUS | Status: AC
  Administered 2021-06-17: 2 g via INTRAVENOUS

## 2021-06-17 MED ORDER — FAMOTIDINE 20 MG PO TABS
20.0000 mg | ORAL_TABLET | Freq: Once | ORAL | Status: AC
  Administered 2021-06-17: 20 mg via ORAL

## 2021-06-17 SURGICAL SUPPLY — 24 items
ADH SKN CLS APL DERMABOND .7 (GAUZE/BANDAGES/DRESSINGS) ×1
CEMENT KYPHON CX01A KIT/MIXER (Cement) ×3 IMPLANT
COVER WAND RF STERILE (DRAPES) ×3 IMPLANT
DERMABOND ADVANCED (GAUZE/BANDAGES/DRESSINGS) ×2
DERMABOND ADVANCED .7 DNX12 (GAUZE/BANDAGES/DRESSINGS) ×1 IMPLANT
DEVICE BIOPSY BONE KYPHX (INSTRUMENTS) ×3 IMPLANT
DRAPE C-ARM XRAY 36X54 (DRAPES) ×3 IMPLANT
DURAPREP 26ML APPLICATOR (WOUND CARE) ×3 IMPLANT
FEE RENTAL RFA GENERATOR (MISCELLANEOUS) IMPLANT
GAUZE 4X4 16PLY ~~LOC~~+RFID DBL (SPONGE) ×3 IMPLANT
GLOVE SURG SYN 9.0  PF PI (GLOVE) ×2
GLOVE SURG SYN 9.0 PF PI (GLOVE) ×1 IMPLANT
GOWN SRG 2XL LVL 4 RGLN SLV (GOWNS) ×1 IMPLANT
GOWN STRL NON-REIN 2XL LVL4 (GOWNS) ×3
GOWN STRL REUS W/ TWL LRG LVL3 (GOWN DISPOSABLE) ×1 IMPLANT
GOWN STRL REUS W/TWL LRG LVL3 (GOWN DISPOSABLE) ×3
MANIFOLD NEPTUNE II (INSTRUMENTS) ×3 IMPLANT
PACK KYPHOPLASTY (MISCELLANEOUS) ×3 IMPLANT
RENTAL RFA  GENERATOR (MISCELLANEOUS)
RENTAL RFA GENERATOR (MISCELLANEOUS) IMPLANT
STRAP SAFETY 5IN WIDE (MISCELLANEOUS) ×3 IMPLANT
SWABSTK COMLB BENZOIN TINCTURE (MISCELLANEOUS) ×3 IMPLANT
TRAY KYPHOPAK 15/3 EXPRESS 1ST (MISCELLANEOUS) ×3 IMPLANT
TRAY KYPHOPAK 20/3 EXPRESS 1ST (MISCELLANEOUS) ×1 IMPLANT

## 2021-06-17 NOTE — Anesthesia Preprocedure Evaluation (Signed)
Anesthesia Evaluation  Patient identified by MRN, date of birth, ID band Patient awake    Reviewed: Allergy & Precautions, NPO status , Patient's Chart, lab work & pertinent test results  History of Anesthesia Complications Negative for: history of anesthetic complications  Airway Mallampati: II  TM Distance: >3 FB Neck ROM: Full    Dental  (+) Poor Dentition   Pulmonary neg pulmonary ROS, neg sleep apnea, neg COPD,    breath sounds clear to auscultation- rhonchi (-) wheezing      Cardiovascular hypertension, Pt. on medications (-) angina+ CAD and + Cardiac Stents  (-) CABG  Rhythm:Regular Rate:Normal - Systolic murmurs and - Diastolic murmurs    Neuro/Psych neg Seizures PSYCHIATRIC DISORDERS Anxiety negative neurological ROS     GI/Hepatic Neg liver ROS, GERD  ,  Endo/Other  negative endocrine ROSneg diabetes  Renal/GU Renal InsufficiencyRenal disease     Musculoskeletal  (+) Arthritis ,   Abdominal (+) - obese,   Peds  Hematology  (+) anemia ,   Anesthesia Other Findings Past Medical History: No date: Anemia     Comment:  vitamin d deficiency No date: Anxiety No date: Breast cancer [facility])     Comment:  bilateral mastectomies.  different types of cancer in               each breast 2011: Cardiomyopathy due to chemotherapy [facility]) No date: Chronic anticoagulation     Comment:  Apixaban 2019: Chronic pain No date: Chronic SI joint pain No date: Coronary artery disease No date: DDD (degenerative disc disease), lumbar No date: First degree AV block No date: GERD (gastroesophageal reflux disease) No date: Hyperlipidemia No date: Hypertension No date: Insomnia No date: Lumbar herniated disc No date: Neuropathy No date: Paroxysmal atrial fibrillation (HCC) No date: Personal history of chemotherapy No date: Personal history of radiation therapy 08/02/2019: S/P drug eluting coronary stent placement      Comment:  2.5 x 12 mm Resolute Onyx to pLAD No date: Valvular insufficiency     Comment:  Mild   Reproductive/Obstetrics                             Anesthesia Physical Anesthesia Plan  ASA: 3  Anesthesia Plan: General   Post-op Pain Management:    Induction: Intravenous  PONV Risk Score and Plan: 2 and Propofol infusion  Airway Management Planned: Natural Airway  Additional Equipment:   Intra-op Plan:   Post-operative Plan:   Informed Consent: I have reviewed the patients History and Physical, chart, labs and discussed the procedure including the risks, benefits and alternatives for the proposed anesthesia with the patient or authorized representative who has indicated his/her understanding and acceptance.     Dental advisory given  Plan Discussed with: CRNA and Anesthesiologist  Anesthesia Plan Comments:         Anesthesia Quick Evaluation

## 2021-06-17 NOTE — Anesthesia Postprocedure Evaluation (Signed)
Anesthesia Post Note  Patient: CYBELE MAULE  Procedure(s) Performed: L2 KYPHOPLASTY  Patient location during evaluation: PACU Anesthesia Type: General Level of consciousness: awake and alert Pain management: pain level controlled Vital Signs Assessment: post-procedure vital signs reviewed and stable Respiratory status: spontaneous breathing, nonlabored ventilation, respiratory function stable and patient connected to nasal cannula oxygen Cardiovascular status: blood pressure returned to baseline and stable Postop Assessment: no apparent nausea or vomiting Anesthetic complications: no   No notable events documented.   Last Vitals:  Vitals:   06/17/21 1615 06/17/21 1633  BP: 112/68 (!) 142/69  Pulse: 76 80  Resp: 17 18  Temp: (!) 36.2 C (!) 36.2 C  SpO2: 97% 97%    Last Pain:  Vitals:   06/17/21 1633  TempSrc: Temporal  PainSc: 0-No pain                 Martha Clan

## 2021-06-17 NOTE — Discharge Instructions (Addendum)
Take it easy today and try to start walking more tomorrow Remove Band-Aids on Thursday then okay to shower Pain medicine as directed Resume Eliquis tonight Call office if you are having problems.AMBULATORY SURGERY  DISCHARGE INSTRUCTIONS   The drugs that you were given will stay in your system until tomorrow so for the next 24 hours you should not:  Drive an automobile Make any legal decisions Drink any alcoholic beverage   You may resume regular meals tomorrow.  Today it is better to start with liquids and gradually work up to solid foods.  You may eat anything you prefer, but it is better to start with liquids, then soup and crackers, and gradually work up to solid foods.   Please notify your doctor immediately if you have any unusual bleeding, trouble breathing, redness and pain at the surgery site, drainage, fever, or pain not relieved by medication.    Additional Instructions:    Please contact your physician with any problems or Same Day Surgery at [phone], Monday through Friday 6 am to 4 pm, or [place] [facility] number at [phone].

## 2021-06-17 NOTE — Transfer of Care (Signed)
Immediate Anesthesia Transfer of Care Note  Patient: Kelsey Carter  Procedure(s) Performed: L2 KYPHOPLASTY  Patient Location: PACU  Anesthesia Type:General  Level of Consciousness: awake, alert  and oriented  Airway & Oxygen Therapy: Patient Spontanous Breathing and Patient connected to face mask oxygen  Post-op Assessment: Report given to RN and Post -op Vital signs reviewed and stable  Post vital signs: Reviewed and stable  Last Vitals:  Vitals Value Taken Time  BP 93/54 06/17/21 1535  Temp 36.1 C 06/17/21 1535  Pulse 106 06/17/21 1543  Resp 20 06/17/21 1536  SpO2 91 % 06/17/21 1543  Vitals shown include unvalidated device data.  Last Pain:  Vitals:   06/17/21 1535  TempSrc:   PainSc: 0-No pain         Complications: No notable events documented.

## 2021-06-17 NOTE — H&P (Signed)
Chief Complaint  Patient presents with   Middle Back - Pain    History of the Present Illness: Kelsey Carter is a 75 y.o. female here today.   The patient presents for evaluation of an L2 compression fracture. She was in the emergency room at [facility]. She had a subsequent MRI on 06/04/2021 that shows significant stenosis at L5-S1 and L4-L5, along with an L2 compression fracture with 40 percent loss of height. She comes in today to discuss potential kyphoplasty.  The patient states her back pain started a few weeks ago, and she was told she had stenosis. She states Dr. Meade Maw told her she probably needed surgery. She states her legs gave out, and she fell. She states she was getting worse, and she called in and was told to come see Dr. Hessie Knows. She states she stopped taking the pain medication because they were making her nauseous. She states she is able to care for herself, but she cannot cook or clean.   The patient states she has had cancer surgery in the past. She is taking Eliquis.  The patient lives in [place] in a [place].  I have reviewed past medical, surgical, social and family history, and allergies as documented in the EMR.  Past Medical History: Past Medical History:  Diagnosis Date   Cancer (CMS-HCC)  2 times   Chronic low back pain   Chronic sacroiliac joint pain   Coronary artery disease   DDD (degenerative disc disease), lumbar   Hyperlipidemia   Hypertension   Insomnia   Neuropathy   Past Surgical History: Past Surgical History:  Procedure Laterality Date   APPENDECTOMY   cardiac catheterization 2018   CESAREAN SECTION   double mastectomy Bilateral   L4-5 decompression, L5-S1 far lateral discectomy 06/29/2018  Dr Meade Maw at [facility]   Past Family History: Family History  Problem Relation Age of Onset   High blood pressure (Hypertension) Mother   Cervical cancer Mother   Cancer Father   Medications: Current  Outpatient Medications Ordered in Epic  Medication Sig Dispense Refill   AMIOdarone (PACERONE) 200 MG tablet Take 1 tablet (200 mg total) by mouth 2 (two) times daily 180 tablet 1   atorvastatin (LIPITOR) 40 MG tablet TAKE 1 TABLET(40 MG) BY MOUTH EVERY DAY 90 tablet 3   calcium carbonate (CALCIUM 500 ORAL) Take 1 tablet by mouth once daily   cholecalciferol (VITAMIN D3) 2,000 unit capsule Take 1 capsule by mouth once daily   cyanocobalamin (VITAMIN B12) 1000 MCG tablet Take 1,000 mcg by mouth once daily   ELIQUIS 5 mg tablet TAKE 1 TABLET(5 MG) BY MOUTH TWICE DAILY 180 tablet 3   magnesium 250 mg Tab Take 1 tablet by mouth once daily   metoprolol succinate (TOPROL-XL) 50 MG XL tablet TAKE 1 TABLET(50 MG) BY MOUTH EVERY DAY 90 tablet 1   mometasone (ELOCON) 0.1 % cream Apply topically once daily For 1-2 weeks. 45 g 0   telmisartan (MICARDIS) 80 MG tablet TAKE 1 TABLET(80 MG) BY MOUTH EVERY DAY 90 tablet 1   hydrOXYzine (ATARAX) 25 MG tablet Take 1 tablet (25 mg total) by mouth 3 (three) times daily as needed for Itching for up to 20 doses (Patient not taking: Reported on 06/11/2021) 20 tablet 0   levothyroxine (SYNTHROID) 25 MCG tablet Take 1 tablet (25 mcg total) by mouth once daily Take on an empty stomach with a glass of water at least 30-60 minutes before breakfast. (Patient not  taking: Reported on 06/11/2021) 90 tablet 1   MULTAQ 400 mg tablet TAKE 1 TABLET(400 MG) BY MOUTH TWICE DAILY WITH MEALS (Patient not taking: No sig reported) 180 tablet 1   ondansetron (ZOFRAN-ODT) 4 MG disintegrating tablet Take 1 tablet (4 mg total) by mouth every 6 (six) hours as needed for Nausea for up to 7 days (Patient not taking: Reported on 06/11/2021) 30 tablet 0   traMADoL (ULTRAM) 50 mg tablet Take 1 tablet (50 mg total) by mouth every 6 (six) hours as needed (Patient not taking: Reported on 06/11/2021) 20 tablet 0   No current Epic-ordered facility-administered medications on file.   Allergies: Allergies   Allergen Reactions   Ciprofloxacin Other (See Comments)  Joint pain   Gabapentin Other (See Comments)  Eyes blurry; vision decreased   Hydrocodone-Acetaminophen Nausea And Vomiting    Body mass index is 26.63 kg/m.  Review of Systems: A comprehensive 14 point ROS was performed, reviewed, and the pertinent orthopaedic findings are documented in the HPI.  Vitals:  06/11/21 0753  BP: 128/74    General Physical Examination:   General/Constitutional: No apparent distress: well-nourished and well developed. Eyes: Pupils equal, round with synchronous movement. Lungs: Clear to auscultation HEENT: Remarkable for caps, but no loose or removable dentures. Vascular: No edema, swelling or tenderness, except as noted in detailed exam. Cardiac: Heart rate and rhythm is regular. Integumentary: No impressive skin lesions present, except as noted in detailed exam. Neuro/Psych: Normal mood and affect, oriented to person, place and time.  Musculoskeletal Examination:  On exam, no clonus. Very tender at L2 with palpable kyphotic deformity.  Radiographs:  No new imaging studies were obtained today.  Assessment: ICD-10-CM  1. Closed compression fracture of L2 vertebra, initial encounter (CMS-HCC) S32.020A   Plan:  The patient has clinical findings of severe pain limiting activities of daily living, inability to care for herself with an L2 compression fracture with underlying severe lumbar stenosis.  We discussed the patient's prior MRI findings. I explained her stenosis likely contributed to her falling. We will plan for L2 kyphoplasty. The surgery and postoperative course in detail.   Surgical Risks:  The nature of the condition and the proposed procedure has been reviewed in detail with the patient. Surgical versus non-surgical options and prognosis for recovery have been reviewed and the inherent risks and benefits of each have been discussed including the risks of infection,  bleeding, injury to nerves/blood vessels/tendons, incomplete relief of symptoms, persisting pain and/or stiffness, loss of function, complex regional pain syndrome, failure of the procedure, as appropriate.  Teeth: Remarkable for caps, but no loose or removable dentures.  Attestation: I, Dawn Royse, am documenting for [employer], MD utilizing [place].   Reviewed  H+P. No changes noted.

## 2021-06-17 NOTE — Op Note (Signed)
06/17/2021  3:25 PM  PATIENT:  Kelsey Carter   MRN: [id number]   PRE-OPERATIVE DIAGNOSIS:  closed wedge compression fracture of L2   POST-OPERATIVE DIAGNOSIS:  closed wedge compression fracture of L2   PROCEDURE:  Procedure(s): KYPHOPLASTY L2  SURGEON: Laurene Footman, MD   ASSISTANTS: None   ANESTHESIA:   local and MAC   EBL:  No intake/output data recorded.   BLOOD ADMINISTERED:none   DRAINS: none    LOCAL MEDICATIONS USED:  MARCAINE    and XYLOCAINE    SPECIMEN: L2 vertebral body biopsy   DISPOSITION OF SPECIMEN: Pathology   COUNTS:  YES   TOURNIQUET:  * No tourniquets in log *   IMPLANTS: Bone cement   DICTATION: .Dragon Dictation  patient was brought to the operating room and after adequate anesthesia was obtained the patient was placed prone.  C arm was brought in in good visualization of the affected level obtained on both AP and lateral projections.  After patient identification and timeout procedures were completed, local anesthetic was infiltrated with 10 cc 1% Xylocaine infiltrated subcutaneously.  This is done the area on the each side of the planned approach.  The back was then prepped and draped in the usual sterile manner and repeat timeout procedure carried out.  A spinal needle was brought down to the pedicle on the each side of L2 and a 50-50 mix of 1% Xylocaine half percent Sensorcaine with epinephrine total of 20 cc injected on each side.  After allowing this to set a small incision was made and the trocar was advanced into the vertebral body in an extrapedicular fashion on the left after first trying on the right not getting good needle position.  Biopsy was obtained drilling was carried out balloon inserted with inflation to 3 cc on the left.  When the cement was appropriate consistency 4.5 cc on the left into the vertebral body without extravasation, good fill superior to inferior endplates and from right to left sides along the inferior endplate.   After the cement had set the trochar was removed and permanent C-arm views obtained.  The wound was closed with Dermabond followed by Band-Aid   PLAN OF CARE: Discharge home after recovery room   PATIENT DISPOSITION:  PACU - hemodynamically stable.

## 2021-06-18 ENCOUNTER — Encounter: Payer: Self-pay | Admitting: Orthopedic Surgery

## 2021-06-19 LAB — SURGICAL PATHOLOGY

## 2021-06-27 DIAGNOSIS — I2510 Atherosclerotic heart disease of native coronary artery without angina pectoris: Secondary | ICD-10-CM | POA: Diagnosis not present

## 2021-06-27 DIAGNOSIS — I480 Paroxysmal atrial fibrillation: Secondary | ICD-10-CM | POA: Diagnosis not present

## 2021-06-27 DIAGNOSIS — R5383 Other fatigue: Secondary | ICD-10-CM | POA: Diagnosis not present

## 2021-06-27 DIAGNOSIS — C50911 Malignant neoplasm of unspecified site of right female breast: Secondary | ICD-10-CM | POA: Diagnosis not present

## 2021-06-27 DIAGNOSIS — Z9889 Other specified postprocedural states: Secondary | ICD-10-CM | POA: Diagnosis not present

## 2021-06-27 DIAGNOSIS — R350 Frequency of micturition: Secondary | ICD-10-CM | POA: Diagnosis not present

## 2021-06-27 DIAGNOSIS — E039 Hypothyroidism, unspecified: Secondary | ICD-10-CM | POA: Diagnosis not present

## 2021-06-27 DIAGNOSIS — Z79899 Other long term (current) drug therapy: Secondary | ICD-10-CM | POA: Diagnosis not present

## 2021-06-27 DIAGNOSIS — T451X5A Adverse effect of antineoplastic and immunosuppressive drugs, initial encounter: Secondary | ICD-10-CM | POA: Diagnosis not present

## 2021-06-27 DIAGNOSIS — R7309 Other abnormal glucose: Secondary | ICD-10-CM | POA: Diagnosis not present

## 2021-06-27 DIAGNOSIS — R5381 Other malaise: Secondary | ICD-10-CM | POA: Diagnosis not present

## 2021-06-27 DIAGNOSIS — I10 Essential (primary) hypertension: Secondary | ICD-10-CM | POA: Diagnosis not present

## 2021-06-27 DIAGNOSIS — C50912 Malignant neoplasm of unspecified site of left female breast: Secondary | ICD-10-CM | POA: Diagnosis not present

## 2021-06-27 DIAGNOSIS — D649 Anemia, unspecified: Secondary | ICD-10-CM | POA: Diagnosis not present

## 2021-06-27 DIAGNOSIS — G620 Drug-induced polyneuropathy: Secondary | ICD-10-CM | POA: Diagnosis not present

## 2021-06-30 DIAGNOSIS — T451X5A Adverse effect of antineoplastic and immunosuppressive drugs, initial encounter: Secondary | ICD-10-CM | POA: Diagnosis not present

## 2021-06-30 DIAGNOSIS — I427 Cardiomyopathy due to drug and external agent: Secondary | ICD-10-CM | POA: Diagnosis not present

## 2021-06-30 DIAGNOSIS — I25118 Atherosclerotic heart disease of native coronary artery with other forms of angina pectoris: Secondary | ICD-10-CM | POA: Diagnosis not present

## 2021-06-30 DIAGNOSIS — I480 Paroxysmal atrial fibrillation: Secondary | ICD-10-CM | POA: Diagnosis not present

## 2021-06-30 DIAGNOSIS — I10 Essential (primary) hypertension: Secondary | ICD-10-CM | POA: Diagnosis not present

## 2021-06-30 DIAGNOSIS — E782 Mixed hyperlipidemia: Secondary | ICD-10-CM | POA: Diagnosis not present

## 2021-07-01 DIAGNOSIS — Z9889 Other specified postprocedural states: Secondary | ICD-10-CM | POA: Diagnosis not present

## 2021-07-04 DIAGNOSIS — M5136 Other intervertebral disc degeneration, lumbar region: Secondary | ICD-10-CM | POA: Diagnosis not present

## 2021-07-04 DIAGNOSIS — C50911 Malignant neoplasm of unspecified site of right female breast: Secondary | ICD-10-CM | POA: Diagnosis not present

## 2021-07-04 DIAGNOSIS — R7309 Other abnormal glucose: Secondary | ICD-10-CM | POA: Diagnosis not present

## 2021-07-04 DIAGNOSIS — C50912 Malignant neoplasm of unspecified site of left female breast: Secondary | ICD-10-CM | POA: Diagnosis not present

## 2021-07-04 DIAGNOSIS — E039 Hypothyroidism, unspecified: Secondary | ICD-10-CM | POA: Diagnosis not present

## 2021-07-04 DIAGNOSIS — I480 Paroxysmal atrial fibrillation: Secondary | ICD-10-CM | POA: Diagnosis not present

## 2021-07-04 DIAGNOSIS — Z9889 Other specified postprocedural states: Secondary | ICD-10-CM | POA: Diagnosis not present

## 2021-07-04 DIAGNOSIS — I427 Cardiomyopathy due to drug and external agent: Secondary | ICD-10-CM | POA: Diagnosis not present

## 2021-07-04 DIAGNOSIS — I25118 Atherosclerotic heart disease of native coronary artery with other forms of angina pectoris: Secondary | ICD-10-CM | POA: Diagnosis not present

## 2021-07-04 DIAGNOSIS — G609 Hereditary and idiopathic neuropathy, unspecified: Secondary | ICD-10-CM | POA: Diagnosis not present

## 2021-07-04 DIAGNOSIS — G894 Chronic pain syndrome: Secondary | ICD-10-CM | POA: Diagnosis not present

## 2021-07-04 DIAGNOSIS — M48061 Spinal stenosis, lumbar region without neurogenic claudication: Secondary | ICD-10-CM | POA: Diagnosis not present

## 2021-08-05 DIAGNOSIS — I480 Paroxysmal atrial fibrillation: Secondary | ICD-10-CM | POA: Diagnosis not present

## 2021-08-05 DIAGNOSIS — I25118 Atherosclerotic heart disease of native coronary artery with other forms of angina pectoris: Secondary | ICD-10-CM | POA: Diagnosis not present

## 2021-08-07 DIAGNOSIS — R29898 Other symptoms and signs involving the musculoskeletal system: Secondary | ICD-10-CM | POA: Diagnosis not present

## 2021-08-07 DIAGNOSIS — G8929 Other chronic pain: Secondary | ICD-10-CM | POA: Diagnosis not present

## 2021-08-07 DIAGNOSIS — R2689 Other abnormalities of gait and mobility: Secondary | ICD-10-CM | POA: Diagnosis not present

## 2021-08-07 DIAGNOSIS — M545 Low back pain, unspecified: Secondary | ICD-10-CM | POA: Diagnosis not present

## 2021-08-13 ENCOUNTER — Other Ambulatory Visit: Payer: Self-pay | Admitting: Neurosurgery

## 2021-08-13 DIAGNOSIS — E782 Mixed hyperlipidemia: Secondary | ICD-10-CM | POA: Diagnosis not present

## 2021-08-13 DIAGNOSIS — I480 Paroxysmal atrial fibrillation: Secondary | ICD-10-CM | POA: Diagnosis not present

## 2021-08-13 DIAGNOSIS — R2689 Other abnormalities of gait and mobility: Secondary | ICD-10-CM

## 2021-08-13 DIAGNOSIS — I10 Essential (primary) hypertension: Secondary | ICD-10-CM | POA: Diagnosis not present

## 2021-08-13 DIAGNOSIS — R29898 Other symptoms and signs involving the musculoskeletal system: Secondary | ICD-10-CM

## 2021-08-13 DIAGNOSIS — I447 Left bundle-branch block, unspecified: Secondary | ICD-10-CM

## 2021-08-13 DIAGNOSIS — I2510 Atherosclerotic heart disease of native coronary artery without angina pectoris: Secondary | ICD-10-CM | POA: Diagnosis not present

## 2021-08-15 DIAGNOSIS — K121 Other forms of stomatitis: Secondary | ICD-10-CM | POA: Diagnosis not present

## 2021-08-18 DIAGNOSIS — M810 Age-related osteoporosis without current pathological fracture: Secondary | ICD-10-CM | POA: Diagnosis not present

## 2021-08-27 ENCOUNTER — Other Ambulatory Visit: Payer: Self-pay

## 2021-08-27 ENCOUNTER — Ambulatory Visit
Admission: RE | Admit: 2021-08-27 | Discharge: 2021-08-27 | Disposition: A | Discharge status: Home / Self Care | Payer: PPO | Source: Ambulatory Visit | Attending: Neurosurgery | Admitting: Neurosurgery

## 2021-08-27 DIAGNOSIS — M5124 Other intervertebral disc displacement, thoracic region: Secondary | ICD-10-CM | POA: Diagnosis not present

## 2021-08-27 DIAGNOSIS — M50223 Other cervical disc displacement at C6-C7 level: Secondary | ICD-10-CM | POA: Diagnosis not present

## 2021-08-27 DIAGNOSIS — M5125 Other intervertebral disc displacement, thoracolumbar region: Secondary | ICD-10-CM | POA: Diagnosis not present

## 2021-08-27 DIAGNOSIS — R29898 Other symptoms and signs involving the musculoskeletal system: Secondary | ICD-10-CM | POA: Diagnosis not present

## 2021-08-27 DIAGNOSIS — R2689 Other abnormalities of gait and mobility: Secondary | ICD-10-CM | POA: Insufficient documentation

## 2021-08-27 DIAGNOSIS — M4802 Spinal stenosis, cervical region: Secondary | ICD-10-CM | POA: Diagnosis not present

## 2021-09-04 ENCOUNTER — Other Ambulatory Visit: Payer: Self-pay | Admitting: Neurosurgery

## 2021-09-04 DIAGNOSIS — M4802 Spinal stenosis, cervical region: Secondary | ICD-10-CM

## 2021-09-09 ENCOUNTER — Emergency Department: Payer: PPO

## 2021-09-09 ENCOUNTER — Other Ambulatory Visit: Payer: Self-pay

## 2021-09-09 ENCOUNTER — Emergency Department
Admission: EM | Admit: 2021-09-09 | Discharge: 2021-09-09 | Disposition: A | Discharge status: Home / Self Care | Payer: PPO | Attending: Emergency Medicine | Admitting: Emergency Medicine

## 2021-09-09 DIAGNOSIS — I10 Essential (primary) hypertension: Secondary | ICD-10-CM | POA: Insufficient documentation

## 2021-09-09 DIAGNOSIS — W16212A Fall in (into) filled bathtub causing other injury, initial encounter: Secondary | ICD-10-CM | POA: Insufficient documentation

## 2021-09-09 DIAGNOSIS — Z853 Personal history of malignant neoplasm of breast: Secondary | ICD-10-CM | POA: Insufficient documentation

## 2021-09-09 DIAGNOSIS — Z7901 Long term (current) use of anticoagulants: Secondary | ICD-10-CM | POA: Diagnosis not present

## 2021-09-09 DIAGNOSIS — S0990XA Unspecified injury of head, initial encounter: Secondary | ICD-10-CM | POA: Insufficient documentation

## 2021-09-09 DIAGNOSIS — M852 Hyperostosis of skull: Secondary | ICD-10-CM | POA: Diagnosis not present

## 2021-09-09 DIAGNOSIS — M546 Pain in thoracic spine: Secondary | ICD-10-CM | POA: Diagnosis not present

## 2021-09-09 DIAGNOSIS — I480 Paroxysmal atrial fibrillation: Secondary | ICD-10-CM | POA: Diagnosis not present

## 2021-09-09 DIAGNOSIS — M545 Low back pain, unspecified: Secondary | ICD-10-CM | POA: Diagnosis not present

## 2021-09-09 DIAGNOSIS — G319 Degenerative disease of nervous system, unspecified: Secondary | ICD-10-CM | POA: Diagnosis not present

## 2021-09-09 DIAGNOSIS — R9082 White matter disease, unspecified: Secondary | ICD-10-CM | POA: Diagnosis not present

## 2021-09-09 DIAGNOSIS — W19XXXA Unspecified fall, initial encounter: Secondary | ICD-10-CM

## 2021-09-09 DIAGNOSIS — I2510 Atherosclerotic heart disease of native coronary artery without angina pectoris: Secondary | ICD-10-CM | POA: Diagnosis not present

## 2021-09-09 DIAGNOSIS — Z79899 Other long term (current) drug therapy: Secondary | ICD-10-CM | POA: Insufficient documentation

## 2021-09-09 MED ORDER — LIDOCAINE 5 % EX PTCH: 1.0000 | MEDICATED_PATCH | CUTANEOUS | 0 refills | Status: DC

## 2021-09-09 NOTE — ED Triage Notes (Signed)
Pt comes with c/o fall. Pt states she slipped in the tub and hit head on floor. Pt states back pain.  Pt states no LOC. Pt is on eliquis.

## 2021-09-09 NOTE — Discharge Instructions (Signed)
Your CAT scan did not show any injury to your brain or skull.  You can continue to take your Eliquis.  You can use the Lidoderm patch for back pain.

## 2021-09-09 NOTE — ED Provider Notes (Signed)
[facility]  ____________________________________________   Event Date/Time   First MD Initiated Contact with Patient 09/09/21 1748     (approximate)  I have reviewed the triage vital signs and the nursing notes.   HISTORY  Chief Complaint Fall    HPI Kelsey Carter is a 75 y.o. female past medical history of cardiomyopathy, paroxysmal A. fib on Eliquis, GERD, hypertension, hyperlipidemia, prior lumbar spinal surgery who presents after a fall.  Patient was getting into the bathtub at a hotel when she slipped and fell hitting the back of her head on the hard floor.  She denies loss of consciousness.  Has a lump on the back of her head.  She denies headache, nausea vomiting.  She did also hit her upper back which is sore.  Denies any numbness or weakness in her upper or lower extremities.  Denies chest or abdominal pain.         Past Medical History:  Diagnosis Date   Anemia    vitamin d deficiency   Anxiety    Breast cancer ([place])    bilateral mastectomies.  different types of cancer in each breast   Cardiomyopathy due to chemotherapy [facility]) 2011   Chronic anticoagulation    Apixaban   Chronic pain 2019   Chronic SI joint pain    Coronary artery disease    DDD (degenerative disc disease), lumbar    First degree AV block    GERD (gastroesophageal reflux disease)    Hyperlipidemia    Hypertension    Insomnia    Lumbar herniated disc    Neuropathy    Paroxysmal atrial fibrillation (HCC)    Personal history of chemotherapy    Personal history of radiation therapy    S/P drug eluting coronary stent placement 08/02/2019   2.5 x 12 mm Resolute Onyx to pLAD   Valvular insufficiency    Mild    Patient Active Problem List   Diagnosis Date Noted   Effort angina ([place]) 08/02/2019   Angina pectoris ([place]) 07/20/2019   H/O bilateral mastectomy 02/14/2019   Neuropathy due to chemotherapeutic drug ([place]) 02/14/2019   Anxiety 03/23/2018    Prediabetes 03/23/2018   DDD (degenerative disc disease), lumbar 03/02/2018   Chronic low back pain (Primary Area of Pain) (Bilateral) (L>R) with left-sided sciatica 02/15/2018   Chronic lower extremity pain (Secondary Area of Pain) (Left) 02/15/2018   Chronic pain syndrome 02/15/2018   Long term prescription benzodiazepine use 02/15/2018   Disorder of skeletal system 02/15/2018   Problems influencing health status 02/15/2018   Chronic sacroiliac joint pain 02/15/2018   Benign essential HTN 10/05/2017   Coronary artery disease involving native coronary artery of native heart 10/05/2017   Hyperlipidemia, mixed 10/05/2017   Elevated TSH 09/21/2017   Cardiomyopathy due to chemotherapy ([place]) 08/24/2017   Insomnia 08/24/2017   Peripheral neuropathy due to chemotherapy ([place]) 08/24/2017   Bilateral breast cancer ([place]) 08/24/2017   Onychomycosis 08/24/2017   Overweight (BMI 25.0-29.9) 08/24/2017   Vitamin D deficiency 08/24/2017   Hyperlipidemia 08/24/2017    Past Surgical History:  Procedure Laterality Date   APPENDECTOMY  1961   BREAST IMPLANT REMOVAL Bilateral 2016   d/t infection. nothing replaced   CARDIAC CATHETERIZATION  2018   no blockages. pt thought it was an MI, but it wasn't   CESAREAN SECTION     x 3   CORONARY STENT INTERVENTION N/A 08/02/2019   Procedure: CORONARY STENT INTERVENTION;  Surgeon: Wellington Hampshire, MD;  Location: [place] [facility];  Service: Cardiovascular;  Laterality: N/A;   EYE SURGERY Bilateral 2013   cataract extractions with iol. cornea replacement also   INTRAVASCULAR PRESSURE WIRE/FFR STUDY N/A 08/02/2019   Procedure: INTRAVASCULAR PRESSURE WIRE/FFR STUDY;  Surgeon: Wellington Hampshire, MD;  Location: [place] [facility];  Service: Cardiovascular;  Laterality: N/A;   KYPHOPLASTY N/A 06/17/2021   Procedure: L2 KYPHOPLASTY;  Surgeon: Hessie Knows, MD;  Location: [facility];  Service: Orthopedics;  Laterality: N/A;   LEFT HEART CATH AND CORONARY  ANGIOGRAPHY Left 07/27/2019   Procedure: LEFT HEART CATH AND CORONARY ANGIOGRAPHY;  Surgeon: Corey Skains, MD;  Location: [place] [facility];  Service: Cardiovascular;  Laterality: Left;   LUMBAR LAMINECTOMY/DECOMPRESSION MICRODISCECTOMY N/A 06/29/2018   Procedure: LUMBAR LAMINECTOMY/DECOMPRESSION MICRODISCECTOMY 1 LEVEL-L4-5, L5-S1 LATERAL DISCECTOMY;  Surgeon: Meade Maw, MD;  Location: [facility];  Service: Neurosurgery;  Laterality: N/A;   MASTECTOMY Bilateral 2011, 2016   5 diff procedures. reconstruction saline removed d/t infx   nsvd     x 1   PLACEMENT OF BREAST IMPLANTS Bilateral 2016   PORT-A-CATH REMOVAL  2016   TONSILLECTOMY      Prior to Admission medications   Medication Sig Start Date End Date Taking? Authorizing Provider  lidocaine (LIDODERM) 5 % Place 1 patch onto the skin daily. Remove & Discard patch within 12 hours or as directed by MD 09/09/21  Yes Rada Hay, MD  amiodarone (PACERONE) 200 MG tablet Take 200 mg by mouth 2 (two) times daily. 04/17/21   [provider]  Cholecalciferol (VITAMIN D3 PO) Take 1 tablet by mouth in the morning.    [provider]  ELIQUIS 5 MG TABS tablet Take 5 mg by mouth 2 (two) times daily. 06/01/21   [provider]  levothyroxine (SYNTHROID) 25 MCG tablet Take 25 mcg by mouth in the morning. 06/11/21   [provider]  MAGNESIUM PO Take 1 tablet by mouth in the morning.    [provider]  metoprolol succinate (TOPROL-XL) 50 MG 24 hr tablet Take 50 mg by mouth in the morning. 03/15/18   [provider]  traMADol (ULTRAM) 50 MG tablet Take 1 tablet (50 mg total) by mouth every 6 (six) hours as needed. 06/17/21   Hessie Knows, MD    Allergies Hydrocodone-acetaminophen, Morphine and related, Ciprofloxacin, and Gabapentin  Family History  Problem Relation Age of Onset   Hypertension Mother    Cervical cancer Mother    Renal Disease Mother    Cancer Father    Breast  cancer Paternal Aunt    Tuberculosis Maternal Grandfather    Breast cancer Paternal Grandmother     Social History Social History   Tobacco Use   Smoking status: Never   Smokeless tobacco: Never  Vaping Use   Vaping Use: Never used  Substance Use Topics   Alcohol use: No   Drug use: Yes    Comment: prescribed narcotics for back pain    Review of Systems   Review of Systems  Eyes:  Negative for visual disturbance.  Respiratory:  Negative for shortness of breath.   Cardiovascular:  Negative for chest pain.  Gastrointestinal:  Negative for abdominal pain, nausea and vomiting.  Musculoskeletal:  Positive for back pain. Negative for gait problem and neck pain.  Neurological:  Negative for headaches.  All other systems reviewed and are negative.  Physical Exam Updated Vital Signs BP (!) 164/94   Pulse 91   Temp 98.4  F (36.9 C)   Resp 18   Ht 5\' 5"  (1.651 m)   Wt 68 kg   SpO2 100%   BMI 24.95 kg/m   Physical Exam Vitals and nursing note reviewed.  Constitutional:      General: She is not in acute distress.    Appearance: Normal appearance.  HENT:     Head: Normocephalic.     Comments: Swelling to the posterior occiput, no laceration    Nose: Nose normal.  Eyes:     General: No scleral icterus.    Conjunctiva/sclera: Conjunctivae normal.  Pulmonary:     Effort: Pulmonary effort is normal. No respiratory distress.     Breath sounds: No stridor.  Musculoskeletal:        General: No deformity or signs of injury.     Cervical back: Normal range of motion. No rigidity or tenderness.     Comments: No midline C, T or L-spine tenderness, tender to palpation over the left thoracic paraspinal musculature  Skin:    General: Skin is dry.     Coloration: Skin is not jaundiced or pale.  Neurological:     General: No focal deficit present.     Mental Status: She is alert and oriented to person, place, and time. Mental status is at baseline.  Psychiatric:        Mood and  Affect: Mood normal.        Behavior: Behavior normal.     LABS (all labs ordered are listed, but only abnormal results are displayed)  Labs Reviewed - No data to display ____________________________________________  EKG  N/a ____________________________________________  RADIOLOGY Almeta Monas, personally viewed and evaluated these images (plain radiographs) as part of my medical decision making, as well as reviewing the written report by the radiologist.  ED MD interpretation: Reviewed the CT head which is negative for acute pathology    ____________________________________________   PROCEDURES  Procedure(s) performed (including Critical Care):  Procedures   ____________________________________________   INITIAL IMPRESSION / ASSESSMENT AND PLAN / ED COURSE     75 year old female presents after mechanical fall with head injury on Eliquis.  CT head obtained from triage is negative.  On exam she has an area of swelling to her posterior occiput but no laceration.  She did hit her back as well but there is no midline C, T or L-spine tenderness.  No other neurologic symptoms.  Patient is stable for discharge.  I advised that she avoid NSAIDs for pain given she is anticoagulated.  Will prescribe her Lidoderm patch for the pain.      ____________________________________________   FINAL CLINICAL IMPRESSION(S) / ED DIAGNOSES  Final diagnoses:  Fall, initial encounter     ED Discharge Orders          Ordered    lidocaine (LIDODERM) 5 %  Every 24 hours        09/09/21 1817             Note:  This document was prepared using Dragon voice recognition software and may include unintentional dictation errors.    Rada Hay, MD 09/09/21 [phone]

## 2021-09-09 NOTE — ED Notes (Addendum)
See triage note  presents s/p fall  states slipped in tub hitting  and having back pain  no LOC

## 2021-09-12 DIAGNOSIS — I2510 Atherosclerotic heart disease of native coronary artery without angina pectoris: Secondary | ICD-10-CM | POA: Diagnosis not present

## 2021-09-12 DIAGNOSIS — I480 Paroxysmal atrial fibrillation: Secondary | ICD-10-CM | POA: Diagnosis not present

## 2021-09-12 DIAGNOSIS — I10 Essential (primary) hypertension: Secondary | ICD-10-CM | POA: Diagnosis not present

## 2021-09-12 DIAGNOSIS — E782 Mixed hyperlipidemia: Secondary | ICD-10-CM | POA: Diagnosis not present

## 2021-09-16 DIAGNOSIS — W182XXA Fall in (into) shower or empty bathtub, initial encounter: Secondary | ICD-10-CM | POA: Diagnosis not present

## 2021-09-16 DIAGNOSIS — M940 Chondrocostal junction syndrome [Tietze]: Secondary | ICD-10-CM | POA: Diagnosis not present

## 2021-09-16 DIAGNOSIS — R42 Dizziness and giddiness: Secondary | ICD-10-CM | POA: Diagnosis not present

## 2021-10-03 ENCOUNTER — Encounter: Payer: Self-pay | Admitting: Emergency Medicine

## 2021-10-03 ENCOUNTER — Other Ambulatory Visit: Payer: Self-pay

## 2021-10-03 ENCOUNTER — Inpatient Hospital Stay
Admission: EM | Admit: 2021-10-03 | Discharge: 2021-10-07 | DRG: 291 | Disposition: A | Discharge status: Home / Self Care | Payer: PPO | Attending: Internal Medicine | Admitting: Internal Medicine

## 2021-10-03 ENCOUNTER — Emergency Department: Payer: PPO

## 2021-10-03 DIAGNOSIS — R079 Chest pain, unspecified: Secondary | ICD-10-CM | POA: Diagnosis not present

## 2021-10-03 DIAGNOSIS — Z79890 Hormone replacement therapy (postmenopausal): Secondary | ICD-10-CM

## 2021-10-03 DIAGNOSIS — I5041 Acute combined systolic (congestive) and diastolic (congestive) heart failure: Secondary | ICD-10-CM | POA: Diagnosis not present

## 2021-10-03 DIAGNOSIS — G8929 Other chronic pain: Secondary | ICD-10-CM | POA: Diagnosis present

## 2021-10-03 DIAGNOSIS — R3914 Feeling of incomplete bladder emptying: Secondary | ICD-10-CM | POA: Diagnosis not present

## 2021-10-03 DIAGNOSIS — M419 Scoliosis, unspecified: Secondary | ICD-10-CM | POA: Diagnosis not present

## 2021-10-03 DIAGNOSIS — R0789 Other chest pain: Secondary | ICD-10-CM | POA: Diagnosis not present

## 2021-10-03 DIAGNOSIS — Z66 Do not resuscitate: Secondary | ICD-10-CM | POA: Diagnosis present

## 2021-10-03 DIAGNOSIS — I480 Paroxysmal atrial fibrillation: Secondary | ICD-10-CM | POA: Diagnosis present

## 2021-10-03 DIAGNOSIS — I509 Heart failure, unspecified: Secondary | ICD-10-CM | POA: Diagnosis not present

## 2021-10-03 DIAGNOSIS — Z79899 Other long term (current) drug therapy: Secondary | ICD-10-CM | POA: Diagnosis not present

## 2021-10-03 DIAGNOSIS — G629 Polyneuropathy, unspecified: Secondary | ICD-10-CM | POA: Diagnosis not present

## 2021-10-03 DIAGNOSIS — C50912 Malignant neoplasm of unspecified site of left female breast: Secondary | ICD-10-CM | POA: Diagnosis present

## 2021-10-03 DIAGNOSIS — Z923 Personal history of irradiation: Secondary | ICD-10-CM

## 2021-10-03 DIAGNOSIS — Z9221 Personal history of antineoplastic chemotherapy: Secondary | ICD-10-CM | POA: Diagnosis not present

## 2021-10-03 DIAGNOSIS — Z955 Presence of coronary angioplasty implant and graft: Secondary | ICD-10-CM

## 2021-10-03 DIAGNOSIS — Z803 Family history of malignant neoplasm of breast: Secondary | ICD-10-CM

## 2021-10-03 DIAGNOSIS — E785 Hyperlipidemia, unspecified: Secondary | ICD-10-CM | POA: Diagnosis not present

## 2021-10-03 DIAGNOSIS — I110 Hypertensive heart disease with heart failure: Secondary | ICD-10-CM | POA: Diagnosis not present

## 2021-10-03 DIAGNOSIS — K449 Diaphragmatic hernia without obstruction or gangrene: Secondary | ICD-10-CM | POA: Diagnosis not present

## 2021-10-03 DIAGNOSIS — Z7901 Long term (current) use of anticoagulants: Secondary | ICD-10-CM | POA: Diagnosis not present

## 2021-10-03 DIAGNOSIS — K219 Gastro-esophageal reflux disease without esophagitis: Secondary | ICD-10-CM | POA: Diagnosis present

## 2021-10-03 DIAGNOSIS — R0902 Hypoxemia: Secondary | ICD-10-CM | POA: Diagnosis present

## 2021-10-03 DIAGNOSIS — Z8049 Family history of malignant neoplasm of other genital organs: Secondary | ICD-10-CM

## 2021-10-03 DIAGNOSIS — I2510 Atherosclerotic heart disease of native coronary artery without angina pectoris: Secondary | ICD-10-CM | POA: Diagnosis not present

## 2021-10-03 DIAGNOSIS — Z885 Allergy status to narcotic agent status: Secondary | ICD-10-CM

## 2021-10-03 DIAGNOSIS — E559 Vitamin D deficiency, unspecified: Secondary | ICD-10-CM | POA: Diagnosis present

## 2021-10-03 DIAGNOSIS — I5031 Acute diastolic (congestive) heart failure: Secondary | ICD-10-CM | POA: Diagnosis not present

## 2021-10-03 DIAGNOSIS — Z853 Personal history of malignant neoplasm of breast: Secondary | ICD-10-CM | POA: Diagnosis not present

## 2021-10-03 DIAGNOSIS — J810 Acute pulmonary edema: Secondary | ICD-10-CM | POA: Diagnosis not present

## 2021-10-03 DIAGNOSIS — Z20822 Contact with and (suspected) exposure to covid-19: Secondary | ICD-10-CM | POA: Diagnosis not present

## 2021-10-03 DIAGNOSIS — Z8249 Family history of ischemic heart disease and other diseases of the circulatory system: Secondary | ICD-10-CM | POA: Diagnosis not present

## 2021-10-03 DIAGNOSIS — N1330 Unspecified hydronephrosis: Secondary | ICD-10-CM | POA: Diagnosis not present

## 2021-10-03 DIAGNOSIS — I440 Atrioventricular block, first degree: Secondary | ICD-10-CM | POA: Diagnosis present

## 2021-10-03 DIAGNOSIS — Z841 Family history of disorders of kidney and ureter: Secondary | ICD-10-CM | POA: Diagnosis not present

## 2021-10-03 DIAGNOSIS — I10 Essential (primary) hypertension: Secondary | ICD-10-CM | POA: Diagnosis present

## 2021-10-03 DIAGNOSIS — R400 Somnolence: Secondary | ICD-10-CM | POA: Diagnosis present

## 2021-10-03 DIAGNOSIS — N289 Disorder of kidney and ureter, unspecified: Secondary | ICD-10-CM | POA: Diagnosis not present

## 2021-10-03 DIAGNOSIS — N179 Acute kidney failure, unspecified: Secondary | ICD-10-CM | POA: Diagnosis not present

## 2021-10-03 DIAGNOSIS — T502X5A Adverse effect of carbonic-anhydrase inhibitors, benzothiadiazides and other diuretics, initial encounter: Secondary | ICD-10-CM | POA: Diagnosis not present

## 2021-10-03 DIAGNOSIS — Z881 Allergy status to other antibiotic agents status: Secondary | ICD-10-CM

## 2021-10-03 DIAGNOSIS — Z9109 Other allergy status, other than to drugs and biological substances: Secondary | ICD-10-CM

## 2021-10-03 DIAGNOSIS — I517 Cardiomegaly: Secondary | ICD-10-CM | POA: Diagnosis not present

## 2021-10-03 DIAGNOSIS — C50911 Malignant neoplasm of unspecified site of right female breast: Secondary | ICD-10-CM | POA: Diagnosis present

## 2021-10-03 DIAGNOSIS — Z9013 Acquired absence of bilateral breasts and nipples: Secondary | ICD-10-CM

## 2021-10-03 DIAGNOSIS — G9389 Other specified disorders of brain: Secondary | ICD-10-CM | POA: Diagnosis not present

## 2021-10-03 DIAGNOSIS — J811 Chronic pulmonary edema: Secondary | ICD-10-CM | POA: Diagnosis not present

## 2021-10-03 DIAGNOSIS — N3289 Other specified disorders of bladder: Secondary | ICD-10-CM | POA: Diagnosis not present

## 2021-10-03 DIAGNOSIS — I447 Left bundle-branch block, unspecified: Secondary | ICD-10-CM

## 2021-10-03 DIAGNOSIS — Z87891 Personal history of nicotine dependence: Secondary | ICD-10-CM

## 2021-10-03 DIAGNOSIS — I5021 Acute systolic (congestive) heart failure: Secondary | ICD-10-CM | POA: Diagnosis not present

## 2021-10-03 DIAGNOSIS — I501 Left ventricular failure: Secondary | ICD-10-CM

## 2021-10-03 DIAGNOSIS — F419 Anxiety disorder, unspecified: Secondary | ICD-10-CM | POA: Diagnosis present

## 2021-10-03 LAB — CBC
HCT: 33.6 % — ABNORMAL LOW (ref 36.0–46.0)
Hemoglobin: 11.6 g/dL — ABNORMAL LOW (ref 12.0–15.0)
MCH: 34.8 pg — ABNORMAL HIGH (ref 26.0–34.0)
MCHC: 34.5 g/dL (ref 30.0–36.0)
MCV: 100.9 fL — ABNORMAL HIGH (ref 80.0–100.0)
Platelets: 322 10*3/uL (ref 150–400)
RBC: 3.33 MIL/uL — ABNORMAL LOW (ref 3.87–5.11)
RDW: 14.6 % (ref 11.5–15.5)
WBC: 5.2 10*3/uL (ref 4.0–10.5)
nRBC: 1 % — ABNORMAL HIGH (ref 0.0–0.2)

## 2021-10-03 LAB — BASIC METABOLIC PANEL
Anion gap: 10 (ref 5–15)
BUN: 16 mg/dL (ref 8–23)
CO2: 27 mmol/L (ref 22–32)
Calcium: 9.4 mg/dL (ref 8.9–10.3)
Chloride: 103 mmol/L (ref 98–111)
Creatinine, Ser: 0.83 mg/dL (ref 0.44–1.00)
GFR, Estimated: >60 mL/min (ref >60)
Glucose, Bld: 128 mg/dL — ABNORMAL HIGH (ref 70–99)
Potassium: 4.6 mmol/L (ref 3.5–5.1)
Sodium: 140 mmol/L (ref 135–145)

## 2021-10-03 LAB — D-DIMER, QUANTITATIVE: D-Dimer, Quant: 0.46 ug/mL-FEU (ref 0.00–0.50)

## 2021-10-03 LAB — RESP PANEL BY RT-PCR (FLU A&B, COVID) ARPGX2
Influenza A by PCR: NEGATIVE
Influenza B by PCR: NEGATIVE
SARS Coronavirus 2 by RT PCR: NEGATIVE

## 2021-10-03 LAB — TROPONIN I (HIGH SENSITIVITY)
Troponin I (High Sensitivity): 11 ng/L (ref <18)
Troponin I (High Sensitivity): 16 ng/L (ref <18)

## 2021-10-03 LAB — TSH: TSH: 11.568 u[IU]/mL — ABNORMAL HIGH (ref 0.350–4.500)

## 2021-10-03 LAB — BRAIN NATRIURETIC PEPTIDE: B Natriuretic Peptide: 745.3 pg/mL — ABNORMAL HIGH (ref 0.0–100.0)

## 2021-10-03 MED ORDER — ONDANSETRON HCL 4 MG/2ML IJ SOLN: 4.0000 mg | Freq: Four times a day (QID) | INTRAMUSCULAR | Status: DC | PRN

## 2021-10-03 MED ORDER — OXYCODONE HCL 5 MG PO TABS: 5.0000 mg | ORAL_TABLET | ORAL | Status: DC | PRN

## 2021-10-03 MED ORDER — METOPROLOL SUCCINATE ER 50 MG PO TB24: 50.0000 mg | ORAL_TABLET | Freq: Every morning | ORAL | Status: DC

## 2021-10-03 MED ORDER — POLYETHYLENE GLYCOL 3350 17 G PO PACK: 17.0000 g | PACK | Freq: Every day | ORAL | Status: DC | PRN

## 2021-10-03 MED ORDER — ASPIRIN EC 81 MG PO TBEC
81.0000 mg | DELAYED_RELEASE_TABLET | Freq: Every day | ORAL | Status: DC
  Administered 2021-10-03 – 2021-10-07 (×5): 81 mg via ORAL
  Filled 2021-10-03 (×5): qty 1

## 2021-10-03 MED ORDER — ATORVASTATIN CALCIUM 10 MG PO TABS
10.0000 mg | ORAL_TABLET | Freq: Every day | ORAL | Status: DC
  Administered 2021-10-04 – 2021-10-07 (×4): 10 mg via ORAL
  Filled 2021-10-03 (×6): qty 1

## 2021-10-03 MED ORDER — APIXABAN 5 MG PO TABS
5.0000 mg | ORAL_TABLET | Freq: Two times a day (BID) | ORAL | Status: DC
  Administered 2021-10-03 – 2021-10-07 (×8): 5 mg via ORAL
  Filled 2021-10-03 (×9): qty 1

## 2021-10-03 MED ORDER — AMIODARONE HCL 200 MG PO TABS
200.0000 mg | ORAL_TABLET | Freq: Two times a day (BID) | ORAL | Status: DC
  Administered 2021-10-03 – 2021-10-07 (×8): 200 mg via ORAL
  Filled 2021-10-03 (×9): qty 1

## 2021-10-03 MED ORDER — SPIRONOLACTONE 25 MG PO TABS
25.0000 mg | ORAL_TABLET | Freq: Every day | ORAL | Status: DC
  Administered 2021-10-03 – 2021-10-04 (×2): 25 mg via ORAL
  Filled 2021-10-03 (×2): qty 1

## 2021-10-03 MED ORDER — ONDANSETRON HCL 4 MG PO TABS: 4.0000 mg | ORAL_TABLET | Freq: Four times a day (QID) | ORAL | Status: DC | PRN

## 2021-10-03 MED ORDER — FUROSEMIDE 10 MG/ML IJ SOLN
40.0000 mg | Freq: Once | INTRAMUSCULAR | Status: AC
  Administered 2021-10-03: 40 mg via INTRAVENOUS
  Filled 2021-10-03: qty 4

## 2021-10-03 MED ORDER — TRAZODONE HCL 50 MG PO TABS: 25.0000 mg | ORAL_TABLET | Freq: Every evening | ORAL | Status: DC | PRN

## 2021-10-03 MED ORDER — DOCUSATE SODIUM 100 MG PO CAPS
100.0000 mg | ORAL_CAPSULE | Freq: Two times a day (BID) | ORAL | Status: DC
  Administered 2021-10-03 – 2021-10-06 (×7): 100 mg via ORAL
  Filled 2021-10-03 (×8): qty 1

## 2021-10-03 MED ORDER — ACETAMINOPHEN 650 MG RE SUPP
650.0000 mg | Freq: Four times a day (QID) | RECTAL | Status: DC | PRN
  Filled 2021-10-03: qty 1

## 2021-10-03 MED ORDER — LEVOTHYROXINE SODIUM 25 MCG PO TABS
37.5000 ug | ORAL_TABLET | Freq: Every day | ORAL | Status: DC
  Administered 2021-10-04 – 2021-10-07 (×4): 37.5 ug via ORAL
  Filled 2021-10-03: qty 2
  Filled 2021-10-03: qty 0.5
  Filled 2021-10-03: qty 2
  Filled 2021-10-03: qty 0.5
  Filled 2021-10-03 (×2): qty 2

## 2021-10-03 MED ORDER — NITROGLYCERIN 0.4 MG SL SUBL
0.4000 mg | SUBLINGUAL_TABLET | SUBLINGUAL | Status: DC | PRN
  Administered 2021-10-03: 0.4 mg via SUBLINGUAL
  Filled 2021-10-03: qty 1

## 2021-10-03 MED ORDER — IRBESARTAN 150 MG PO TABS
150.0000 mg | ORAL_TABLET | Freq: Every day | ORAL | Status: DC
  Filled 2021-10-03: qty 1

## 2021-10-03 MED ORDER — ACETAMINOPHEN 325 MG PO TABS: 650.0000 mg | ORAL_TABLET | Freq: Four times a day (QID) | ORAL | Status: DC | PRN

## 2021-10-03 MED ORDER — FUROSEMIDE 10 MG/ML IJ SOLN
40.0000 mg | Freq: Two times a day (BID) | INTRAMUSCULAR | Status: DC
  Administered 2021-10-03 – 2021-10-04 (×2): 40 mg via INTRAVENOUS
  Filled 2021-10-03 (×2): qty 4

## 2021-10-03 MED ORDER — SODIUM CHLORIDE 0.9% FLUSH
3.0000 mL | Freq: Two times a day (BID) | INTRAVENOUS | Status: DC
  Administered 2021-10-03 – 2021-10-07 (×9): 3 mL via INTRAVENOUS

## 2021-10-03 MED ORDER — HYDRALAZINE HCL 20 MG/ML IJ SOLN: 5.0000 mg | INTRAMUSCULAR | Status: DC | PRN

## 2021-10-03 MED ORDER — BISACODYL 5 MG PO TBEC: 5.0000 mg | DELAYED_RELEASE_TABLET | Freq: Every day | ORAL | Status: DC | PRN

## 2021-10-03 NOTE — ED Provider Notes (Signed)
[facility] Department Provider Note ____________________________________________   Event Date/Time   First MD Initiated Contact with Patient 10/03/21 [phone]     (approximate)  I have reviewed the triage vital signs and the nursing notes.  HISTORY  Chief Complaint Chest Pain  HPI LORAY AKARD is a 75 y.o. female with a history of breast cancer atrial fibrillation cardiomyopathy coronary disease  Patient felt well yesterday.  Reports she got up this morning she was awoken to feeling of racing heart and "palpitations".  She took her blood pressure and her blood pressure machine told her that her blood pressure was about 180 and "critically high"  Since her palpitations actually have resolved but associated with this she now left with a sense of a moderate to light pressure across the left chest.  She also developed a slight cough this morning.  No leg swelling.  No fevers or chills.   No pain in her back or difficulty breathing.  She reports she has a history of significant blood pressure issues, follows with Dr. Nehemiah Massed.  She is compliant with her use of Eliquis taking it twice daily.  When she felt a racing heart and saw her blood pressure was very high she took her morning dose of blood pressure medication reports her palpitations have resolved.    Past Medical History:  Diagnosis Date   Anemia    vitamin d deficiency   Anxiety    Breast cancer ([place])    bilateral mastectomies.  different types of cancer in each breast   Cardiomyopathy due to chemotherapy [facility]) 2011   Chronic anticoagulation    Apixaban   Chronic pain 2019   Chronic SI joint pain    Coronary artery disease    DDD (degenerative disc disease), lumbar    First degree AV block    GERD (gastroesophageal reflux disease)    Hyperlipidemia    Hypertension    Insomnia    Lumbar herniated disc    Neuropathy    Paroxysmal atrial fibrillation (HCC)    Personal history of  chemotherapy    Personal history of radiation therapy    S/P drug eluting coronary stent placement 08/02/2019   2.5 x 12 mm Resolute Onyx to pLAD   Valvular insufficiency    Mild    Patient Active Problem List   Diagnosis Date Noted   Effort angina ([place]) 08/02/2019   Angina pectoris ([place]) 07/20/2019   H/O bilateral mastectomy 02/14/2019   Neuropathy due to chemotherapeutic drug ([place]) 02/14/2019   Anxiety 03/23/2018   Prediabetes 03/23/2018   DDD (degenerative disc disease), lumbar 03/02/2018   Chronic low back pain (Primary Area of Pain) (Bilateral) (L>R) with left-sided sciatica 02/15/2018   Chronic lower extremity pain (Secondary Area of Pain) (Left) 02/15/2018   Chronic pain syndrome 02/15/2018   Long term prescription benzodiazepine use 02/15/2018   Disorder of skeletal system 02/15/2018   Problems influencing health status 02/15/2018   Chronic sacroiliac joint pain 02/15/2018   Benign essential HTN 10/05/2017   Coronary artery disease involving native coronary artery of native heart 10/05/2017   Hyperlipidemia, mixed 10/05/2017   Elevated TSH 09/21/2017   Cardiomyopathy due to chemotherapy ([place]) 08/24/2017   Insomnia 08/24/2017   Peripheral neuropathy due to chemotherapy ([place]) 08/24/2017   Bilateral breast cancer ([place]) 08/24/2017   Onychomycosis 08/24/2017   Overweight (BMI 25.0-29.9) 08/24/2017   Vitamin D deficiency 08/24/2017   Hyperlipidemia 08/24/2017    Past Surgical History:  Procedure Laterality Date  APPENDECTOMY  1961   BREAST IMPLANT REMOVAL Bilateral 2016   d/t infection. nothing replaced   CARDIAC CATHETERIZATION  2018   no blockages. pt thought it was an MI, but it wasn't   CESAREAN SECTION     x 3   CORONARY STENT INTERVENTION N/A 08/02/2019   Procedure: CORONARY STENT INTERVENTION;  Surgeon: Wellington Hampshire, MD;  Location: [place] [facility];  Service: Cardiovascular;  Laterality: N/A;   EYE SURGERY Bilateral 2013   cataract extractions with  iol. cornea replacement also   INTRAVASCULAR PRESSURE WIRE/FFR STUDY N/A 08/02/2019   Procedure: INTRAVASCULAR PRESSURE WIRE/FFR STUDY;  Surgeon: Wellington Hampshire, MD;  Location: [place] [facility];  Service: Cardiovascular;  Laterality: N/A;   KYPHOPLASTY N/A 06/17/2021   Procedure: L2 KYPHOPLASTY;  Surgeon: Hessie Knows, MD;  Location: [facility] OR[facility];  Service: Orthopedics;  Laterality: N/A;   LEFT HEART CATH AND CORONARY ANGIOGRAPHY Left 07/27/2019   Procedure: LEFT HEART CATH AND CORONARY ANGIOGRAPHY;  Surgeon: Corey Skains, MD;  Location: [place] [facility];  Service: Cardiovascular;  Laterality: Left;   LUMBAR LAMINECTOMY/DECOMPRESSION MICRODISCECTOMY N/A 06/29/2018   Procedure: LUMBAR LAMINECTOMY/DECOMPRESSION MICRODISCECTOMY 1 LEVEL-L4-5, L5-S1 LATERAL DISCECTOMY;  Surgeon: Meade Maw, MD;  Location: [facility] OR[facility];  Service: Neurosurgery;  Laterality: N/A;   MASTECTOMY Bilateral 2011, 2016   5 diff procedures. reconstruction saline removed d/t infx   nsvd     x 1   PLACEMENT OF BREAST IMPLANTS Bilateral 2016   PORT-A-CATH REMOVAL  2016   TONSILLECTOMY      Prior to Admission medications   Medication Sig Start Date End Date Taking? Authorizing Provider  alendronate (FOSAMAX) 70 MG tablet Take 70 mg by mouth once a week. 08/21/21  Yes [provider]  amiodarone (PACERONE) 200 MG tablet Take 200 mg by mouth 2 (two) times daily. 04/17/21  Yes [provider]  Cholecalciferol (VITAMIN D3 PO) Take 1 tablet by mouth in the morning.   Yes [provider]  ELIQUIS 5 MG TABS tablet Take 5 mg by mouth 2 (two) times daily. 06/01/21  Yes [provider]  levothyroxine (SYNTHROID) 25 MCG tablet Take 25 mcg by mouth in the morning. 06/11/21  Yes [provider]  lidocaine (LIDODERM) 5 % Place 1 patch onto the skin daily. Remove & Discard patch within 12 hours or as directed by MD 09/09/21  Yes Rada Hay, MD  MAGNESIUM PO Take 1 tablet by mouth  in the morning.   Yes [provider]  metoprolol succinate (TOPROL-XL) 50 MG 24 hr tablet Take 50 mg by mouth in the morning. 03/15/18  Yes [provider]  valsartan (DIOVAN) 160 MG tablet Take 160 mg by mouth daily. 09/12/21  Yes [provider]  traMADol (ULTRAM) 50 MG tablet Take 1 tablet (50 mg total) by mouth every 6 (six) hours as needed. Patient not taking: No sig reported 06/17/21   Hessie Knows, MD    Allergies Hydrocodone-acetaminophen, Morphine and related, Ciprofloxacin, and Gabapentin  Family History  Problem Relation Age of Onset   Hypertension Mother    Cervical cancer Mother    Renal Disease Mother    Cancer Father    Breast cancer Paternal Aunt    Tuberculosis Maternal Grandfather    Breast cancer Paternal Grandmother     Social History Social History   Tobacco Use   Smoking status: Never   Smokeless tobacco: Never  Vaping Use   Vaping Use: Never used  Substance Use  Topics   Alcohol use: No   Drug use: Yes    Comment: prescribed narcotics for back pain    Review of Systems Constitutional: No fever/chills Eyes: No visual changes. ENT: No sore throat. Cardiovascular: Denies chest pain. Respiratory: Denies shortness of breath.  Started noticing she was having cough while sleeping last night.  Her husband noticed her coughing during the night. Gastrointestinal: No abdominal pain.   Musculoskeletal: Negative for back pain. Skin: Negative for rash. Neurological: Negative for headaches, areas of focal weakness or numbness.    ____________________________________________   PHYSICAL EXAM:  VITAL SIGNS: ED Triage Vitals  Enc Vitals Group     BP 10/03/21 0714 (!) 141/103     Pulse Rate 10/03/21 0714 89     Resp 10/03/21 0714 18     Temp 10/03/21 0714 98 F (36.7 C)     Temp Source 10/03/21 0714 Oral     SpO2 10/03/21 0714 94 %     Weight 10/03/21 0711 148 lb (67.1 kg)     Height 10/03/21 0711 5\' 5"  (1.651 m)     Head  Circumference --      Peak Flow --      Pain Score 10/03/21 0711 8     Pain Loc --      Pain Edu? --      Excl. in [place]? --     Constitutional: Alert and oriented. Well appearing and in no acute distress. Eyes: Conjunctivae are normal. Head: Atraumatic. Nose: No congestion/rhinnorhea. Mouth/Throat: Mucous membranes are moist. Neck: No stridor.  Cardiovascular: Normal rate, regular rhythm. Grossly normal heart sounds.  Good peripheral circulation. Respiratory: Normal respiratory effort.  No retractions. Lungs CTAB except for some very slight crackles in the bases.  Does exhibit occasional dry cough. Gastrointestinal: Soft and nontender. No distention. Musculoskeletal: No lower extremity tenderness nor edema.  No noted peripheral edema. Neurologic:  Normal speech and language. No gross focal neurologic deficits are appreciated.  Skin:  Skin is warm, dry and intact. No rash noted. Psychiatric: Mood and affect are normal. Speech and behavior are normal.  ____________________________________________   LABS (all labs ordered are listed, but only abnormal results are displayed)  Labs Reviewed  BASIC METABOLIC PANEL - Abnormal; Notable for the following components:      Result Value   Glucose, Bld 128 (*)    All other components within normal limits  CBC - Abnormal; Notable for the following components:   RBC 3.33 (*)    Hemoglobin 11.6 (*)    HCT 33.6 (*)    MCV 100.9 (*)    MCH 34.8 (*)    nRBC 1.0 (*)    All other components within normal limits  TSH - Abnormal; Notable for the following components:   TSH 11.568 (*)    All other components within normal limits  BRAIN NATRIURETIC PEPTIDE - Abnormal; Notable for the following components:   B Natriuretic Peptide 745.3 (*)    All other components within normal limits  RESP PANEL BY RT-PCR (FLU A&B, COVID) ARPGX2  D-DIMER, QUANTITATIVE  CBG MONITORING, ED  TROPONIN I (HIGH SENSITIVITY)  TROPONIN I (HIGH SENSITIVITY)    ____________________________________________  EKG  Reviewed inter by me at 710 Heart rate 99 QRS 140 QTc 470 Left bundle branch block.  Sinus rhythm.  No evidence of obvious ischemia in the setting of left bundle branch block.  Left bundle branch block is pre-existing ____________________________________________  RADIOLOGY  DG Chest Portable 1 View  Result  Date: 10/03/2021 CLINICAL DATA:  Chest pain EXAM: PORTABLE CHEST 1 VIEW COMPARISON:  07/21/2019 FINDINGS: Mild cardiac enlargement. Pulmonary vascular congestion. Mild interstitial edema bilaterally. No pleural effusion. IMPRESSION: Congestive heart failure with mild edema. Electronically Signed   By: Franchot Gallo M.D.   On: 10/03/2021 07:56     Chest x-ray reviewed, mild bilateral interstitial edema.  ____________________________________________   PROCEDURES  Procedure(s) performed: None  Procedures  Critical Care performed: No  ____________________________________________   INITIAL IMPRESSION / ASSESSMENT AND PLAN / ED COURSE  Pertinent labs & imaging results that were available during my care of the patient were reviewed by me and considered in my medical decision making (see chart for details).   Patient presents for chest pressure palpitations hypertension.  Also noted to drop her saturations 82% with good waveform with short ambulation.  Placed on 2 L nasal cannula.  Chest x-ray showing findings concerning for potential CHF BNP elevated.  No elevated white count no fevers no chills no infectious symptoms noted.  The patient's clinical history hypertension clinical exam history I suspect likely volume overload possibly CHF or hypertensive emergency  Patient given nitroglycerin blood pressure improved to 160s symptoms improved chest pain resolving.  Discussed with Dr. Josefa Half and cardiology will also give 40 mg IV Lasix.  Discussed further with the patient patient feels that she would definitely like to be admitted  given the severity of the dyspnea she experienced with short ambulation this morning.  I think that is peripheral acceptable.  Will admit to the hospital service for further care and management of what appears to be apparently CHF with pulmonary edema, further testing to include COVID testing pending at this time.  Negative D-dimer patient uses Eliquis, low likelihood of PE  ----------------------------------------- 9:06 AM on 10/03/2021 ----------------------------------------- Admission discussed with Dr. Lorin Mercy      ____________________________________________   FINAL CLINICAL IMPRESSION(S) / ED DIAGNOSES  Final diagnoses:  Acute pulmonary edema with heart disease ([place])        Note:  This document was prepared using Dragon voice recognition software and may include unintentional dictation errors       Delman Kitten, MD 10/03/21 [phone]

## 2021-10-03 NOTE — Assessment & Plan Note (Signed)
-  s/p stent -On Eliquis so not on ASA

## 2021-10-03 NOTE — ED Notes (Signed)
Patient placed on hospital bed. Purewick applied. Resting.

## 2021-10-03 NOTE — ED Notes (Signed)
Pt ambulated to toilet and back to bed. 

## 2021-10-03 NOTE — Assessment & Plan Note (Signed)
-  Recent LDL was 93, goal <70 -Will add Lipitor 10 mg daily

## 2021-10-03 NOTE — Assessment & Plan Note (Signed)
-  s/p B mastectomy -No longer taking anastrazole -Last f/u was in 08/2018, needs outpatient f/u

## 2021-10-03 NOTE — ED Notes (Signed)
MD MQ placed pt on 2 lpm via [place].

## 2021-10-03 NOTE — Assessment & Plan Note (Signed)
-  Rate controlled with Amiodarone and metoprolol -Continue Eliquis -Not in RVR at the time of admission

## 2021-10-03 NOTE — Consult Note (Signed)
CARDIOLOGY CONSULT NOTE               Patient ID: Kelsey Carter MRN: [id number] DOB/AGE: 02-03-1946 75 y.o.  Admit date: 10/03/2021 Referring Physician Lorin Mercy Primary Physician [facility] Primary Cardiologist Nehemiah Massed Reason for Consultation Acute CHF  HPI: 75 year old female referred for evaluation of acute CHF. The patient has a history of hypertension, paroxysmal atrial fibrillation, on amiodarone and Eliquis, hyperlipidemia, and coronary artery disease status post PCI and stent placement of proximal LAD in 07/2019. The patient reports a several day history of generalized fatigue, daytime somnolence, and development of a cough last night while lying in bed. She denies any recent noticeable exertional shortness of breath, but states she has been minimally active. She reports that she woke up early this morning with palpitations described as heart racing with left sided chest discomfort. Her blood pressure was elevated to 176H systolic. She took her morning doses of metoprolol and amiodarone, and due to the persistent nature of her palpitations, she presented to [facility] ER for evaluation. Her blood pressure in the ER was elevated to 141/103. BNP was elevated to 745. Troponin was normal x 2. ECG showed sinus rhythm at a rate of 99 bpm with known LBBB. Chest xray showed mild interstitial edema. She received one dose of IV Lasix and has had good urine output. Her oxygen dropped to 85-88% with ambulation, and she was placed on supplemental oxygen. She denies any previous similar episodes. At this time, she reports feeling better, denies chest pain, palpitations, or shortness of breath. Recent outpatient 2D echocardiogram on 08/05/2021 revealed normal left ventricular function with LVEF greater than 55% with mild valvular insufficiencies. Lexiscan Myoview on 08/05/21 revealed LVEF 47% with minimal hypokinesis of the septum and apex without evidence of ischemia.  Review of systems complete and found  to be negative unless listed above     Past Medical History:  Diagnosis Date   Anemia    vitamin d deficiency   Anxiety    Breast cancer ([place])    bilateral mastectomies.  different types of cancer in each breast   Cardiomyopathy due to chemotherapy [facility]) 2011   Chronic anticoagulation    Apixaban   Chronic pain 2019   Chronic SI joint pain    Coronary artery disease    DDD (degenerative disc disease), lumbar    First degree AV block    GERD (gastroesophageal reflux disease)    Hyperlipidemia    Hypertension    Insomnia    Lumbar herniated disc    Neuropathy    Paroxysmal atrial fibrillation (HCC)    Personal history of chemotherapy    Personal history of radiation therapy    S/P drug eluting coronary stent placement 08/02/2019   2.5 x 12 mm Resolute Onyx to pLAD   Valvular insufficiency    Mild    Past Surgical History:  Procedure Laterality Date   APPENDECTOMY  1961   BREAST IMPLANT REMOVAL Bilateral 2016   d/t infection. nothing replaced   CARDIAC CATHETERIZATION  2018   no blockages. pt thought it was an MI, but it wasn't   CESAREAN SECTION     x 3   CORONARY STENT INTERVENTION N/A 08/02/2019   Procedure: CORONARY STENT INTERVENTION;  Surgeon: Wellington Hampshire, MD;  Location: [place] [facility];  Service: Cardiovascular;  Laterality: N/A;   EYE SURGERY Bilateral 2013   cataract extractions with iol. cornea replacement also   INTRAVASCULAR PRESSURE WIRE/FFR STUDY N/A 08/02/2019   Procedure:  INTRAVASCULAR PRESSURE WIRE/FFR STUDY;  Surgeon: Wellington Hampshire, MD;  Location: [place] [facility];  Service: Cardiovascular;  Laterality: N/A;   KYPHOPLASTY N/A 06/17/2021   Procedure: L2 KYPHOPLASTY;  Surgeon: Hessie Knows, MD;  Location: [facility] OR[facility];  Service: Orthopedics;  Laterality: N/A;   LEFT HEART CATH AND CORONARY ANGIOGRAPHY Left 07/27/2019   Procedure: LEFT HEART CATH AND CORONARY ANGIOGRAPHY;  Surgeon: Corey Skains, MD;  Location: [place] [facility];   Service: Cardiovascular;  Laterality: Left;   LUMBAR LAMINECTOMY/DECOMPRESSION MICRODISCECTOMY N/A 06/29/2018   Procedure: LUMBAR LAMINECTOMY/DECOMPRESSION MICRODISCECTOMY 1 LEVEL-L4-5, L5-S1 LATERAL DISCECTOMY;  Surgeon: Meade Maw, MD;  Location: [facility] OR[facility];  Service: Neurosurgery;  Laterality: N/A;   MASTECTOMY Bilateral 2011, 2016   5 diff procedures. reconstruction saline removed d/t infx   nsvd     x 1   PLACEMENT OF BREAST IMPLANTS Bilateral 2016   PORT-A-CATH REMOVAL  2016   TONSILLECTOMY      (Not in a hospital admission)  Social History   Socioeconomic History   Marital status: Married    Spouse name: Eddie Dibbles   Number of children: 4   Years of education: 1 year of college   Highest education level: 12th grade  Occupational History   Occupation: Retired  Tobacco Use   Smoking status: Former    Years: 3.00    Types: Cigarettes   Smokeless tobacco: Never  Scientific laboratory technician Use: Never used  Substance and Sexual Activity   Alcohol use: No   Drug use: Yes    Comment: prescribed narcotics for back pain   Sexual activity: Not Currently  Other Topics Concern   Not on file  Social History Narrative   Not on file   Social Determinants of Health   Financial Resource Strain: Not on file  Food Insecurity: Not on file  Transportation Needs: Not on file  Physical Activity: Not on file  Stress: Not on file  Social Connections: Not on file  Intimate Partner Violence: Not on file    Family History  Problem Relation Age of Onset   Heart failure Mother    Hypertension Mother    Cervical cancer Mother    Renal Disease Mother    Cancer Father    Tuberculosis Maternal Grandfather    Breast cancer Paternal Grandmother    Breast cancer Paternal Aunt       Review of systems complete and found to be negative unless listed above      PHYSICAL EXAM  General: Well developed, well nourished, in no acute distress HEENT:  Normocephalic and atramatic Neck:  No JVD.   Lungs: Clear bilaterally to auscultation  Heart: HRRR . Normal S1 and S2 without gallops or murmurs.  Abdomen: nondistended Msk:  no obvious abnormalities Extremities: No clubbing, cyanosis, with trace bilateral anklesinu edema.   Neuro: Alert and oriented X 3. Psych:  Good affect, responds appropriately  Labs:   Lab Results  Component Value Date   WBC 5.2 10/03/2021   HGB 11.6 (L) 10/03/2021   HCT 33.6 (L) 10/03/2021   MCV 100.9 (H) 10/03/2021   PLT 322 10/03/2021    Recent Labs  Lab 10/03/21 0752  NA 140  K 4.6  CL 103  CO2 27  BUN 16  CREATININE 0.83  CALCIUM 9.4  GLUCOSE 128*   Lab Results  Component Value Date   CKTOTAL 115 10/04/2013   CKMB 1.4 10/04/2013   TROPONINI 0.02 10/04/2013    Lab Results  Component  Value Date   CHOL 130 09/21/2018   CHOL 117 08/24/2017   CHOL 160 04/24/2013   Lab Results  Component Value Date   HDL 49 09/21/2018   HDL 53 08/24/2017   HDL 54 04/24/2013   Lab Results  Component Value Date   LDLCALC 59 09/21/2018   LDLCALC 48 08/24/2017   LDLCALC 87 04/24/2013   Lab Results  Component Value Date   TRIG 108 09/21/2018   TRIG 82 08/24/2017   TRIG 97 04/24/2013   Lab Results  Component Value Date   CHOLHDL 2.7 09/21/2018   CHOLHDL 2.2 08/24/2017   No results found for: LDLDIRECT    Radiology: CT HEAD WO CONTRAST (5MM)  Result Date: 09/09/2021 CLINICAL DATA:  [place] and hit head today. EXAM: CT HEAD WITHOUT CONTRAST TECHNIQUE: Contiguous axial images were obtained from the base of the skull through the vertex without intravenous contrast. COMPARISON:  None. FINDINGS: Brain: Age related cerebral atrophy, ventriculomegaly and periventricular white matter disease. No extra-axial fluid collections are identified. No CT findings for acute hemispheric infarction or intracranial hemorrhage. No mass lesions. The brainstem and cerebellum are normal. Vascular: Moderate vascular calcifications but no aneurysm or hyperdense vessels.  Skull: No acute fracture. No bone lesions. Hyperostosis frontalis interna. Sinuses/Orbits: The paranasal sinuses and mastoid air cells are clear. The globes are intact. Other: No scalp lesions or hematoma. IMPRESSION: 1. Age related cerebral atrophy, ventriculomegaly and periventricular white matter disease. 2. No acute intracranial findings or skull fracture. Electronically Signed   By: Marijo Sanes M.D.   On: 09/09/2021 17:34   DG Chest Portable 1 View  Result Date: 10/03/2021 CLINICAL DATA:  Chest pain EXAM: PORTABLE CHEST 1 VIEW COMPARISON:  07/21/2019 FINDINGS: Mild cardiac enlargement. Pulmonary vascular congestion. Mild interstitial edema bilaterally. No pleural effusion. IMPRESSION: Congestive heart failure with mild edema. Electronically Signed   By: Franchot Gallo M.D.   On: 10/03/2021 07:56    EKG: sinus rhythm, LBBB  ASSESSMENT AND PLAN:  Acute CHF, presenting with a several day history of generalized fatigue, daytime somnolence, and a cough that started last night. BNP elevated; high sensitivity troponin normal. Chest xray with mild interstitial edema. Patient placed on supplemental oxygen and IV Lasix. 2D echocardiogram 07/2021 revealed normal left ventricular function. Paroxysmal atrial fibrillation, on amiodarone and Eliquis, with heart racing and palpitations that woke her this morning,  resolved after taking morning medications. Currently in sinus rhythm. Coronary artery disease, status post DES proximal LAD, in 07/2019. On statin; not on aspirin. Recent Lexiscan Myoview in 07/2021 negative for ischemia. 4.   Hypertension, elevated this morning, improved now, currently on spironolactone, metoprolol succinate, and irbesartan.   Plan: Continue Eliquis for stroke prevention Continue amiodarone and metoprolol for rate and rhythm control Start low dose aspirin for history of coronary disease with stent Review 2D echocardiogram Continue diuresis with careful monitoring of renal  status Continue statin  Signed: Clabe Seal PA-C 10/03/2021, 12:09 PM

## 2021-10-03 NOTE — ED Notes (Signed)
Pt ambulated to toilet then back to bed.

## 2021-10-03 NOTE — ED Triage Notes (Signed)
Presents with chest pain and pressure this am  states pain woke her up   pain is non radiating

## 2021-10-03 NOTE — ED Notes (Addendum)
Pt ambulted to toilet. Pt advised she feels more SOB when walking than normal. Her oxygen sat dropped to 85-88% with good waveform.  Returned to 93% on RA.

## 2021-10-03 NOTE — Assessment & Plan Note (Signed)
-  Patient without known h/o CHF presenting with new-onset cough, SOB, and hypoxia to the low 80s with ambulation -CXR consistent with pulmonary edema -Eevated BNP without known baseline -With elevated BNP and abnl CXR, acute decompensated CHF seems probable as diagnosis -Will admit, as per the Emergency HF Mortality Risk Grade.  The patient has: severe pulmonary edema requiring new O2 therapy -Will request echocardiogram - although she had this last done in August, she has a change in clinical status -ACE/ARB, beta blocker, and spironolactone are recommended as per guideline-directed medical therapy to reduce morbidity/mortality; she is already on the others, will add spironolactone -CHF order set utilized -Cardiology consultation requested -Was given Lasix 40 mg x 1 in ER and will repeat with 40 mg IV BID -Continue [place] O2 for now as needed -Normal kidney function at this time, will follow with diuresis -Initial HS troponin negative with repeat pending; doubt ACS based on symptoms

## 2021-10-03 NOTE — H&P (Signed)
History and Physical    Kelsey Carter ZHG:[id number] DOB: 11/20/1946 DOA: 10/03/2021  PCP: Tracie Harrier, MD Consultants:  Nehemiah Massed - cardiology; Izora Ribas - neurosurgery; Grayland Ormond - oncology Patient coming from:  Home - lives with husband; NOK: Husband, [phone]  Chief Complaint: SOB/CP  HPI: Kelsey Carter is a 75 y.o. female with medical history significant of breast cancer; CAD s/p stent; HTN; HLD; and afib on Eliquis presenting with SOB/CP. She reports that she had a cough last night, it woke her up.  She awoke this AM with palpitations and chest pain.  She got up and took her BP and her machine said "I'm in crisis."  She woke up her husband and came to the ER. She hasn't felt well for a few days, getting out of breath with ambulation.  No orthopnea, no PND.  No weight change.  No LE edema.  She has always had HTN since her 15s but denies h/o CHF.  Last echo was in August by Dr. Nehemiah Massed and was unremarkable.    ED Course: Likely new-onset CHF with pulm edema.  Husband noticed coughing overnight.  This AM with palpitations and chest pressure with cough.  Not SOB at rest but got very dyspneic and dropped O2 sats to 82%.  Not on home O2.  BNP 745.  BP improved with NTG, given 40 mg IV Lasix.  On 2L [place] O2.  Review of Systems: As per HPI; otherwise review of systems reviewed and negative.   Ambulatory Status:  Ambulates without assistance  COVID Vaccine Status:  None  Past Medical History:  Diagnosis Date   Anemia    vitamin d deficiency   Anxiety    Breast cancer ([place])    bilateral mastectomies.  different types of cancer in each breast   Cardiomyopathy due to chemotherapy [facility]) 2011   Chronic anticoagulation    Apixaban   Chronic pain 2019   Chronic SI joint pain    Coronary artery disease    DDD (degenerative disc disease), lumbar    First degree AV block    GERD (gastroesophageal reflux disease)    Hyperlipidemia    Hypertension    Insomnia    Lumbar  herniated disc    Neuropathy    Paroxysmal atrial fibrillation (HCC)    Personal history of chemotherapy    Personal history of radiation therapy    S/P drug eluting coronary stent placement 08/02/2019   2.5 x 12 mm Resolute Onyx to pLAD   Valvular insufficiency    Mild    Past Surgical History:  Procedure Laterality Date   APPENDECTOMY  1961   BREAST IMPLANT REMOVAL Bilateral 2016   d/t infection. nothing replaced   CARDIAC CATHETERIZATION  2018   no blockages. pt thought it was an MI, but it wasn't   CESAREAN SECTION     x 3   CORONARY STENT INTERVENTION N/A 08/02/2019   Procedure: CORONARY STENT INTERVENTION;  Surgeon: Wellington Hampshire, MD;  Location: [place];  Service: Cardiovascular;  Laterality: N/A;   EYE SURGERY Bilateral 2013   cataract extractions with iol. cornea replacement also   INTRAVASCULAR PRESSURE WIRE/FFR STUDY N/A 08/02/2019   Procedure: INTRAVASCULAR PRESSURE WIRE/FFR STUDY;  Surgeon: Wellington Hampshire, MD;  Location: [facility];  Service: Cardiovascular;  Laterality: N/A;   KYPHOPLASTY N/A 06/17/2021   Procedure: L2 KYPHOPLASTY;  Surgeon: Hessie Knows, MD;  Location: [facility];  Service: Orthopedics;  Laterality: N/A;   LEFT HEART CATH  AND CORONARY ANGIOGRAPHY Left 07/27/2019   Procedure: LEFT HEART CATH AND CORONARY ANGIOGRAPHY;  Surgeon: Corey Skains, MD;  Location: [place] [facility];  Service: Cardiovascular;  Laterality: Left;   LUMBAR LAMINECTOMY/DECOMPRESSION MICRODISCECTOMY N/A 06/29/2018   Procedure: LUMBAR LAMINECTOMY/DECOMPRESSION MICRODISCECTOMY 1 LEVEL-L4-5, L5-S1 LATERAL DISCECTOMY;  Surgeon: Meade Maw, MD;  Location: [facility];  Service: Neurosurgery;  Laterality: N/A;   MASTECTOMY Bilateral 2011, 2016   5 diff procedures. reconstruction saline removed d/t infx   nsvd     x 1   PLACEMENT OF BREAST IMPLANTS Bilateral 2016   PORT-A-CATH REMOVAL  2016   TONSILLECTOMY      Social History   Socioeconomic  History   Marital status: Married    Spouse name: Eddie Dibbles   Number of children: 4   Years of education: 1 year of college   Highest education level: 12th grade  Occupational History   Occupation: Retired  Tobacco Use   Smoking status: Former    Years: 3.00    Types: Cigarettes   Smokeless tobacco: Never  Scientific laboratory technician Use: Never used  Substance and Sexual Activity   Alcohol use: No   Drug use: Yes    Comment: prescribed narcotics for back pain   Sexual activity: Not Currently  Other Topics Concern   Not on file  Social History Narrative   Not on file   Social Determinants of Health   Financial Resource Strain: Not on file  Food Insecurity: Not on file  Transportation Needs: Not on file  Physical Activity: Not on file  Stress: Not on file  Social Connections: Not on file  Intimate Partner Violence: Not on file    Allergies  Allergen Reactions   Hydrocodone-Acetaminophen Nausea And Vomiting    Severe vomitting   Morphine And Related Nausea And Vomiting    Pt preference    Ciprofloxacin Other (See Comments)    Joint pain    Gabapentin Other (See Comments)    Eyes blurry; vision decreased, dizziness     Family History  Problem Relation Age of Onset   Heart failure Mother    Hypertension Mother    Cervical cancer Mother    Renal Disease Mother    Cancer Father    Tuberculosis Maternal Grandfather    Breast cancer Paternal Grandmother    Breast cancer Paternal Aunt     Prior to Admission medications   Medication Sig Start Date End Date Taking? Authorizing Provider  alendronate (FOSAMAX) 70 MG tablet Take 70 mg by mouth once a week. 08/21/21  Yes [provider]  amiodarone (PACERONE) 200 MG tablet Take 200 mg by mouth 2 (two) times daily. 04/17/21  Yes [provider]  Cholecalciferol (VITAMIN D3 PO) Take 1 tablet by mouth in the morning.   Yes [provider]  ELIQUIS 5 MG TABS tablet Take 5 mg by mouth 2 (two) times daily.  06/01/21  Yes [provider]  levothyroxine (SYNTHROID) 25 MCG tablet Take 25 mcg by mouth in the morning. 06/11/21  Yes [provider]  lidocaine (LIDODERM) 5 % Place 1 patch onto the skin daily. Remove & Discard patch within 12 hours or as directed by MD 09/09/21  Yes Rada Hay, MD  MAGNESIUM PO Take 1 tablet by mouth in the morning.   Yes [provider]  metoprolol succinate (TOPROL-XL) 50 MG 24 hr tablet Take 50 mg by mouth in the morning. 03/15/18  Yes [provider]  valsartan (  DIOVAN) 160 MG tablet Take 160 mg by mouth daily. 09/12/21  Yes [provider]  traMADol (ULTRAM) 50 MG tablet Take 1 tablet (50 mg total) by mouth every 6 (six) hours as needed. Patient not taking: No sig reported 06/17/21   Hessie Knows, MD    Physical Exam: Vitals:   10/03/21 0711 10/03/21 0714 10/03/21 0800 10/03/21 0900  BP:  (!) 141/103 (!) 168/96 (!) 166/87  Pulse:  89 81 76  Resp:  18 (!) 23 (!) 22  Temp:  98 F (36.7 C)    TempSrc:  Oral    SpO2:  94% 91% 99%  Weight: 67.1 kg     Height: 5\' 5"  (1.651 m)        General:  Appears calm and comfortable and is in NAD, on [place] O2 Eyes:  PERRL, EOMI, normal lids, iris ENT:  grossly normal hearing, lips & tongue, mmm; appropriate dentition Neck:  no LAD, masses or thyromegaly Cardiovascular:  RRR, no m/r/g. No LE edema.  Respiratory:   Crackles still present over 1/2 up lung fields.  Mildly increased respiratory effort on 2L [place] O2. Abdomen:  soft, NT, ND Skin:  no rash or induration seen on limited exam Musculoskeletal:  grossly normal tone BUE/BLE, good ROM, no bony abnormality Psychiatric:  grossly normal mood and affect, speech fluent and appropriate, AOx3 Neurologic:  CN 2-12 grossly intact, moves all extremities in coordinated fashion    Radiological Exams on Admission: Independently reviewed - see discussion in A/P where applicable  DG Chest Portable 1 View  Result Date:  10/03/2021 CLINICAL DATA:  Chest pain EXAM: PORTABLE CHEST 1 VIEW COMPARISON:  07/21/2019 FINDINGS: Mild cardiac enlargement. Pulmonary vascular congestion. Mild interstitial edema bilaterally. No pleural effusion. IMPRESSION: Congestive heart failure with mild edema. Electronically Signed   By: Franchot Gallo M.D.   On: 10/03/2021 07:56    EKG: Independently reviewed.  NSR with rate 89, 1st degree AV block; LBBB which has progressed from prior   Labs on Admission: I have personally reviewed the available labs and imaging studies at the time of the admission.  Pertinent labs:   Glucose 128 BNP 745.3 - none prior HS troponin 11 WBC 5.2 Hgb 11.6 - stable D-dimer 0.46 TSH 11.568, similar to 06/27/21 Lipids: 170/54/93/116   Assessment/Plan Principal Problem:   New onset of congestive heart failure (HCC) Active Problems:   Bilateral breast cancer (HCC)   Hyperlipidemia   Benign essential HTN   Coronary artery disease involving native coronary artery of native heart   PAF (paroxysmal atrial fibrillation) (HCC)    * New onset of congestive heart failure ([place]) -Patient without known h/o CHF presenting with new-onset cough, SOB, and hypoxia to the low 80s with ambulation -CXR consistent with pulmonary edema -Eevated BNP without known baseline -With elevated BNP and abnl CXR, acute decompensated CHF seems probable as diagnosis -Will admit, as per the Emergency HF Mortality Risk Grade.  The patient has: severe pulmonary edema requiring new O2 therapy -Will request echocardiogram - although she had this last done in August, she has a change in clinical status -ACE/ARB, beta blocker, and spironolactone are recommended as per guideline-directed medical therapy to reduce morbidity/mortality; she is already on the others, will add spironolactone -CHF order set utilized -Cardiology consultation requested -Was given Lasix 40 mg x 1 in ER and will repeat with 40 mg IV BID -Continue [place] O2 for now  as needed -Normal kidney function at this time, will follow with diuresis -Initial  HS troponin negative with repeat pending; doubt ACS based on symptoms   PAF (paroxysmal atrial fibrillation) (HCC) -Rate controlled with Amiodarone and metoprolol -Continue Eliquis -Not in RVR at the time of admission  Coronary artery disease involving native coronary artery of native heart -s/p stent -On Eliquis so not on ASA  Benign essential HTN -Continue Toprol XL and ARB (Avapro for Diovan, per formulary) -Elevated BP while in the ER, which patient reports is not her norm; likely associated with volume overload -Will add prn IV hydralazine  Hyperlipidemia -Recent LDL was 93, goal <70 -Will add Lipitor 10 mg daily  Bilateral breast cancer ([place]) -s/p B mastectomy -No longer taking anastrazole -Last f/u was in 08/2018, needs outpatient f/u       Note: This patient has been tested and is negative for the novel coronavirus COVID-19. She has NOT been vaccinated against COVID-19.   Level of care: Med-Surg - telemetry DVT prophylaxis: Eliquis Code Status:  DNR - confirmed with patient Family Communication: None present; husband left just prior to my evaluation Disposition Plan:  The patient is from: home  Anticipated d/c is to: home, possibly with [facility] services  Anticipated d/c date will depend on clinical response to treatment, likely 2-4 days  Patient is currently: acutely ill Consults called: Cardiology; [facility] team; PT/OT/Nutrition Admission status: Admit - It is my clinical opinion that admission to INPATIENT is reasonable and necessary because this patient will require at least 2 midnights in the hospital to treat this condition based on the medical complexity of the problems presented.  Given the aforementioned information, the predictability of an adverse outcome is felt to be significant.   Karmen Bongo MD [facility]   How to contact the [facility] Attending or Consulting provider  [place] or covering provider during after hours [place], for this patient?  Check the care team in [facility] and look for a) attending/consulting [facility] provider listed and b) the [facility] team listed Log into [email or web address] and use [place] universal password to access. If you do not have the password, please contact the hospital operator. Locate the [facility] provider you are looking for under [facility] and page to a number that you can be directly reached. If you still have difficulty reaching the provider, please page the [facility] (Director on Call) for the Hospitalists listed on amion for assistance.   10/03/2021, 10:31 AM

## 2021-10-03 NOTE — ED Notes (Signed)
RN to bedside. Pt here for "racing heart rate and HTN". She took all of her morning meds right then. Pt describes pain in left chest as a pressure.

## 2021-10-03 NOTE — Assessment & Plan Note (Addendum)
-  Continue Toprol XL and ARB (Avapro for Diovan, per formulary) -Elevated BP while in the ER, which patient reports is not her norm; likely associated with volume overload -Will add prn IV hydralazine

## 2021-10-04 ENCOUNTER — Inpatient Hospital Stay
Admit: 2021-10-04 | Discharge: 2021-10-04 | Disposition: A | Discharge status: Home / Self Care | Payer: PPO | Attending: Internal Medicine | Admitting: Internal Medicine

## 2021-10-04 DIAGNOSIS — I509 Heart failure, unspecified: Secondary | ICD-10-CM | POA: Diagnosis not present

## 2021-10-04 LAB — CBC
HCT: 36.8 % (ref 36.0–46.0)
Hemoglobin: 12.6 g/dL (ref 12.0–15.0)
MCH: 34 pg (ref 26.0–34.0)
MCHC: 34.2 g/dL (ref 30.0–36.0)
MCV: 99.2 fL (ref 80.0–100.0)
Platelets: 314 10*3/uL (ref 150–400)
RBC: 3.71 MIL/uL — ABNORMAL LOW (ref 3.87–5.11)
RDW: 14.1 % (ref 11.5–15.5)
WBC: 5.7 10*3/uL (ref 4.0–10.5)
nRBC: 0.9 % — ABNORMAL HIGH (ref 0.0–0.2)

## 2021-10-04 LAB — BASIC METABOLIC PANEL
Anion gap: 11 (ref 5–15)
BUN: 18 mg/dL (ref 8–23)
CO2: 29 mmol/L (ref 22–32)
Calcium: 9.4 mg/dL (ref 8.9–10.3)
Chloride: 98 mmol/L (ref 98–111)
Creatinine, Ser: 1.04 mg/dL — ABNORMAL HIGH (ref 0.44–1.00)
GFR, Estimated: 56 mL/min — ABNORMAL LOW (ref >60)
Glucose, Bld: 95 mg/dL (ref 70–99)
Potassium: 3.6 mmol/L (ref 3.5–5.1)
Sodium: 138 mmol/L (ref 135–145)

## 2021-10-04 LAB — ECHOCARDIOGRAM COMPLETE
AR max vel: 2.16 cm2
AV Peak grad: 4 mmHg
Ao pk vel: 1 m/s
Area-P 1/2: 4.1 cm2
Height: 65 in
S' Lateral: 3.15 cm
Weight: 2368 oz

## 2021-10-04 MED ORDER — ENSURE ENLIVE PO LIQD: 237.0000 mL | Freq: Two times a day (BID) | ORAL | Status: DC

## 2021-10-04 MED ORDER — ADULT MULTIVITAMIN W/MINERALS CH
1.0000 | ORAL_TABLET | Freq: Every day | ORAL | Status: DC
  Administered 2021-10-04 – 2021-10-06 (×3): 1 via ORAL
  Filled 2021-10-04 (×4): qty 1

## 2021-10-04 MED ORDER — FUROSEMIDE 10 MG/ML IJ SOLN: 40.0000 mg | Freq: Every day | INTRAMUSCULAR | Status: DC

## 2021-10-04 MED ORDER — METOPROLOL SUCCINATE ER 25 MG PO TB24: 25.0000 mg | ORAL_TABLET | Freq: Every morning | ORAL | Status: DC

## 2021-10-04 MED ORDER — MIDODRINE HCL 5 MG PO TABS
5.0000 mg | ORAL_TABLET | Freq: Once | ORAL | Status: AC
  Administered 2021-10-04: 5 mg via ORAL
  Filled 2021-10-04: qty 1

## 2021-10-04 MED ORDER — LIVING BETTER WITH HEART FAILURE BOOK: Freq: Once | Status: AC

## 2021-10-04 MED ORDER — MIDODRINE HCL 5 MG PO TABS
5.0000 mg | ORAL_TABLET | Freq: Three times a day (TID) | ORAL | Status: DC
  Administered 2021-10-04 – 2021-10-06 (×4): 5 mg via ORAL
  Filled 2021-10-04 (×5): qty 1

## 2021-10-04 NOTE — Discharge Instructions (Signed)

## 2021-10-04 NOTE — Progress Notes (Signed)
PROGRESS NOTE    Kelsey Carter  QPY:[id number] DOB: 10/20/1946 DOA: 10/03/2021 PCP: Tracie Harrier, MD    Brief Narrative:  Kelsey Carter is a 75 y.o. female with medical history significant of breast cancer; CAD s/p stent; HTN; HLD; and afib on Eliquis presenting with SOB/palpitations.   10/15 she denies chest pain.  Feels a little better.  Urinating well     Consultants:  Cardiologt=y  Procedures:   Antimicrobials:      Subjective: No chest pain, dizziness, lightheadedness  Objective: Vitals:   10/04/21 0145 10/04/21 0146 10/04/21 0538 10/04/21 0540  BP:  120/80  128/75  Pulse:  70 63 63  Resp:  18 18 18   Temp:  97.8 F (36.6 C) 97.8 F (36.6 C) 97.8 F (36.6 C)  TempSrc:  Oral Oral Oral  SpO2: 100% 98% 100% 100%  Weight:      Height:        Intake/Output Summary (Last 24 hours) at 10/04/2021 0845 Last data filed at 10/03/2021 1123 Gross per 24 hour  Intake --  Output 1700 ml  Net -1700 ml   Filed Weights   10/03/21 0711  Weight: 67.1 kg    Examination:  General exam: Appears calm and comfortable  Respiratory system: Bilateral rales at bases, no wheezing Cardiovascular system: S1 & S2 heard, RRR. No JVD, murmurs, rubs, gallops or clicks. Gastrointestinal system: Abdomen is nondistended, soft and nontender. No organomegaly or masses felt. Normal bowel sounds heard. Central nervous system: Alert and oriented.  Mostly intact Extremities: No edema Psychiatry: Judgement and insight appear normal. Mood & affect appropriate.     Data Reviewed: I have personally reviewed following labs and imaging studies  CBC: Recent Labs  Lab 10/03/21 0752 10/04/21 0603  WBC 5.2 5.7  HGB 11.6* 12.6  HCT 33.6* 36.8  MCV 100.9* 99.2  PLT 322 124   Basic Metabolic Panel: Recent Labs  Lab 10/03/21 0752 10/04/21 0603  NA 140 138  K 4.6 3.6  CL 103 98  CO2 27 29  GLUCOSE 128* 95  BUN 16 18  CREATININE 0.83 1.04*  CALCIUM 9.4 9.4    GFR: Estimated Creatinine Clearance: 41.4 mL/min (A) (by C-G formula based on SCr of 1.04 mg/dL (H)). Liver Function Tests: No results for input(s): AST, ALT, ALKPHOS, BILITOT, PROT, ALBUMIN in the last 168 hours. No results for input(s): LIPASE, AMYLASE in the last 168 hours. No results for input(s): AMMONIA in the last 168 hours. Coagulation Profile: No results for input(s): INR, PROTIME in the last 168 hours. Cardiac Enzymes: No results for input(s): CKTOTAL, CKMB, CKMBINDEX, TROPONINI in the last 168 hours. BNP (last 3 results) No results for input(s): PROBNP in the last 8760 hours. HbA1C: No results for input(s): HGBA1C in the last 72 hours. CBG: No results for input(s): GLUCAP in the last 168 hours. Lipid Profile: No results for input(s): CHOL, HDL, LDLCALC, TRIG, CHOLHDL, LDLDIRECT in the last 72 hours. Thyroid Function Tests: Recent Labs    10/03/21 0756  TSH 11.568*   Anemia Panel: No results for input(s): VITAMINB12, FOLATE, FERRITIN, TIBC, IRON, RETICCTPCT in the last 72 hours. Sepsis Labs: No results for input(s): PROCALCITON, LATICACIDVEN in the last 168 hours.  Recent Results (from the past 240 hour(s))  Resp Panel by RT-PCR (Flu A&B, Covid) Nasopharyngeal Swab     Status: None   Collection Time: 10/03/21  8:39 AM   Specimen: Nasopharyngeal Swab; Nasopharyngeal(NP) swabs in vial transport medium  Result Value Ref Range Status  SARS Coronavirus 2 by RT PCR NEGATIVE NEGATIVE Final    Comment: (NOTE) SARS-CoV-2 target nucleic acids are NOT DETECTED.  The SARS-CoV-2 RNA is generally detectable in upper respiratory specimens during the acute phase of infection. The lowest concentration of SARS-CoV-2 viral copies this assay can detect is 138 copies/mL. A negative result does not preclude SARS-Cov-2 infection and should not be used as the sole basis for treatment or other patient management decisions. A negative result may occur with  improper specimen  collection/handling, submission of specimen other than nasopharyngeal swab, presence of viral mutation(s) within the areas targeted by this assay, and inadequate number of viral copies(<138 copies/mL). A negative result must be combined with clinical observations, patient history, and epidemiological information. The expected result is Negative.  Fact Sheet for Patients:  EntrepreneurPulse.com.au  Fact Sheet for Healthcare Providers:  IncredibleEmployment.be  This test is no t yet approved or cleared by the [place] FDA and  has been authorized for detection and/or diagnosis of SARS-CoV-2 by FDA under an Emergency Use Authorization (EUA). This EUA will remain  in effect (meaning this test can be used) for the duration of the COVID-19 declaration under Section 564(b)(1) of the Act, 21 U.S.C.section 360bbb-3(b)(1), unless the authorization is terminated  or revoked sooner.       Influenza A by PCR NEGATIVE NEGATIVE Final   Influenza B by PCR NEGATIVE NEGATIVE Final    Comment: (NOTE) The Xpert Xpress SARS-CoV-2/FLU/RSV plus assay is intended as an aid in the diagnosis of influenza from Nasopharyngeal swab specimens and should not be used as a sole basis for treatment. Nasal washings and aspirates are unacceptable for Xpert Xpress SARS-CoV-2/FLU/RSV testing.  Fact Sheet for Patients: EntrepreneurPulse.com.au  Fact Sheet for Healthcare Providers: IncredibleEmployment.be  This test is not yet approved or cleared by the [place] FDA and has been authorized for detection and/or diagnosis of SARS-CoV-2 by FDA under an Emergency Use Authorization (EUA). This EUA will remain in effect (meaning this test can be used) for the duration of the COVID-19 declaration under Section 564(b)(1) of the Act, 21 U.S.C. section 360bbb-3(b)(1), unless the authorization is terminated or revoked.  Performed at [facility], [address] [place], [place] [address]          Radiology Studies: DG Chest Portable 1 View  Result Date: 10/03/2021 CLINICAL DATA:  Chest pain EXAM: PORTABLE CHEST 1 VIEW COMPARISON:  07/21/2019 FINDINGS: Mild cardiac enlargement. Pulmonary vascular congestion. Mild interstitial edema bilaterally. No pleural effusion. IMPRESSION: Congestive heart failure with mild edema. Electronically Signed   By: Franchot Gallo M.D.   On: 10/03/2021 07:56        Scheduled Meds:  amiodarone  200 mg Oral BID   apixaban  5 mg Oral BID   aspirin EC  81 mg Oral Daily   atorvastatin  10 mg Oral Daily   docusate sodium  100 mg Oral BID   furosemide  40 mg Intravenous BID   irbesartan  150 mg Oral Daily   levothyroxine  37.5 mcg Oral Q0600   metoprolol succinate  50 mg Oral q AM   sodium chloride flush  3 mL Intravenous Q12H   spironolactone  25 mg Oral Daily   Continuous Infusions:  Assessment & Plan:   Principal Problem:   New onset of congestive heart failure (HCC) Active Problems:   Bilateral breast cancer (HCC)   Hyperlipidemia   Benign essential HTN   Coronary artery disease involving native coronary artery of native  heart   PAF (paroxysmal atrial fibrillation) ([place])   New onset of acute systolic heart failure  Echo today with EF 45 to 50%.  No regional wall motion abnormalities.  Grade 1 diastolic dysfunction. Good urine output Will decrease Lasix to 40 mg daily from twice daily since with low BP Hold spironolactone, spironolactone and ARB due to low BP Continue Toprol-XL if BP tolerates, decrease rate from 50 mg to 25 mg to start tomorrow.  Did not receive today due to BP  being low Cardiology following Will discuss with cardiology since her EF is down plans for ischemic work-up as inpatient?     PAF (paroxysmal atrial fibrillation) (HCC) -Rate controlled with Amiodarone and metoprolol 10/15 rate controlled Continue Eliquis Decrease metoprolol dose due  to low BP   Coronary artery disease involving native coronary artery of native heart Status post stent -On Eliquis so not on aspirin  Continue beta-blockers     Benign essential HTN BP on low side Since needs diuresis we will add midodrine 5 mg 3 times daily Hold other medications as above   Hyperlipidemia -Recent LDL was 93, goal <70 -Lipitor 10 mg daily was added   Bilateral breast cancer ([place]) -s/p B mastectomy -No longer taking anastrazole -Last f/u was in 08/2018, needs outpatient f/u        DVT prophylaxis: Eliquis Code Status: DNR Family Communication: None at bedside Disposition Plan:  Status is: Inpatient  Remains inpatient appropriate because:Inpatient level of care appropriate due to severity of illness   Dispo: The patient is from: Home              Anticipated d/c is to: Home              Patient currently is not medically stable to d/c.              Difficult to place patient No            LOS: 1 day   Time spent: 35 minutes with more than 50% on [place], MD [facility] Pager [phone]  If 7PM-7AM, please contact night-coverage 10/04/2021, 8:45 AM

## 2021-10-04 NOTE — Evaluation (Signed)
Occupational Therapy Evaluation Patient Details Name: Kelsey Carter MRN: [id number] DOB: 20-Jul-1946 Today's Date: 10/04/2021   History of Present Illness Kelsey Carter is a 75 y.o. female with medical history significant of breast cancer; CAD s/p stent; HTN; HLD; and afib on Eliquis presenting with SOB/CP. Patient without known h/o CHF presenting with new-onset cough, SOB, and hypoxia to the low 80s with ambulation.   Clinical Impression   Kelsey Carter presents today with slight weakness, reduced endurance, and mildly impaired balance. She is A&O x 4, is able to perform ADL, bed mobility, and transfers [facility]. She reports feeling at baseline level of performance in fxl mobility and states that she is eager to return home. She lives in a [place] with level entry, with her husband; both are retired, 2 children live in [place]. [place], pt IND in ADL, IADL, active in community, ambulating w/o AD. She does, however, report 3 falls in the previous 12 months. Discussed possible use of DME in near future. Patient and therapist in agreement that no further OT services are needed at present. Pt will follow up with her cardiologist and would like to re-enroll in an outpt cardiac rehab program she participated in several years ago. Will DC from OT service at this time.    Recommendations for follow up therapy are one component of a multi-disciplinary discharge planning process, led by the attending physician.  Recommendations may be updated based on patient status, additional functional criteria and insurance authorization.   Follow Up Recommendations  No OT follow up    Equipment Recommendations  None recommended by OT    Recommendations for Other Services       Precautions / Restrictions Precautions Precautions: Fall Precaution Comments: mod fall risk Restrictions Weight Bearing Restrictions: No      Mobility Bed Mobility Overal bed mobility: Independent                   Transfers Overall transfer level: Independent                    Balance Overall balance assessment: Needs assistance   Sitting balance-Leahy Scale: Normal       Standing balance-Leahy Scale: Good Standing balance comment: No AD, no LOB with dynamic standing balance                           ADL either performed or assessed with clinical judgement   ADL Overall ADL's : Independent                                             Vision         Perception     Praxis      Pertinent Vitals/Pain Pain Assessment: No/denies pain     Hand Dominance     Extremity/Trunk Assessment Upper Extremity Assessment Upper Extremity Assessment: Overall WFL for tasks assessed   Lower Extremity Assessment Lower Extremity Assessment: Overall WFL for tasks assessed   Cervical / Trunk Assessment Cervical / Trunk Assessment: Normal   Communication Communication Communication: No difficulties   Cognition Arousal/Alertness: Awake/alert Behavior During Therapy: WFL for tasks assessed/performed Overall Cognitive Status: Within Functional Limits for tasks assessed  General Comments       Exercises Other Exercises Other Exercises: Role of OT, DC recs, educ re: use of DME, falls prevention   Shoulder Instructions      Home Living Family/patient expects to be discharged to:: Private residence Living Arrangements: Spouse/significant other Available Help at Discharge: Family;Available 24 hours/day Type of Home: Apartment Home Access: Ramped entrance     Home Layout: One level     Bathroom Shower/Tub: Occupational psychologist: Handicapped height     Home Equipment: Grab bars - tub/shower;Hand held shower head          Prior Functioning/Environment Level of Independence: Independent        Comments: Independent without assistive device        OT Problem List: Decreased  strength;Impaired balance (sitting and/or standing);Increased edema;Decreased activity tolerance      OT Treatment/Interventions:      OT Goals(Current goals can be found in the care plan section) Acute Rehab OT Goals Patient Stated Goal: to go back home and to cardiac rehab OT Goal Formulation: With patient Time For Goal Achievement: 10/18/21 Potential to Achieve Goals: Good  OT Frequency:     Barriers to D/C:            Co-evaluation              AM-PAC OT "6 Clicks" Daily Activity     Outcome Measure Help from another person eating meals?: None Help from another person taking care of personal grooming?: None Help from another person toileting, which includes using toliet, bedpan, or urinal?: None Help from another person bathing (including washing, rinsing, drying)?: None Help from another person to put on and taking off regular upper body clothing?: None Help from another person to put on and taking off regular lower body clothing?: None 6 Click Score: 24   End of Session    Activity Tolerance: Patient tolerated treatment well Patient left: in bed;with call bell/phone within reach;with bed alarm set;with nursing/sitter in room;with family/visitor present  OT Visit Diagnosis: Unsteadiness on feet (R26.81);Muscle weakness (generalized) (M62.81);History of falling (Z91.81)                Time: 4081-4481 OT Time Calculation (min): 11 min Charges:  OT General Charges $OT Visit: 1 Visit OT Evaluation $OT Eval Low Complexity: 1 Low OT Treatments $Self Care/Home Management : 8-22 mins Kelsey Lobo, PhD, MS, OTR/L 10/04/21, 4:31 PM

## 2021-10-04 NOTE — Evaluation (Signed)
Physical Therapy Evaluation Patient Details Name: Kelsey Carter MRN: [id number] DOB: Sep 10, 1946 Today's Date: 10/04/2021  History of Present Illness  Kelsey Carter is a 75 y.o. female with medical history significant of breast cancer; CAD s/p stent; HTN; HLD; and afib on Eliquis presenting with SOB/CP. Patient without known h/o CHF presenting with new-onset cough, SOB, and hypoxia to the low 80s with ambulation.   Clinical Impression  Pt is a pleasant 75 year old female who presents to PT evaluation after coming to ED with new onset of CHF. Pt reporting that she was independent prior to admission without the usage of any AD and has 24/7 support of spouse. Upon evaluation, pt requiring CGA for ambulation of 100 ft with usage of RW. During ambulation, pt reporting that she "doesn't feel right" and became diaphoretic. Pt able to ambulate back to bed and reported feeling better after sitting back down. Patient's seated BP obtained at 119/77 mmHg, HR 100 bpm. Unable to obtain BP reading in standing due to pt requesting to sit.  Pt remained on 2L of oxygen throughout session maintaining >94%. Recommending home with HHPT at discharge with support of spouse + usage of AD to reduce fall risk. Pt will continue to benefit from skilled PT services to monitor cardiovascular response to exercise and assist with return to prior level of function as able.      Recommendations for follow up therapy are one component of a multi-disciplinary discharge planning process, led by the attending physician.  Recommendations may be updated based on patient status, additional functional criteria and insurance authorization.  Follow Up Recommendations Home health PT;Supervision for mobility/OOB    Equipment Recommendations  Rolling walker with 5" wheels    Recommendations for Other Services       Precautions / Restrictions Restrictions Weight Bearing Restrictions: No      Mobility  Bed Mobility Overal  bed mobility: Modified Independent             General bed mobility comments: Usage of bed rails to assist to EOB    Transfers Overall transfer level: Needs assistance Equipment used: None Transfers: Sit to/from Stand Sit to Stand: Supervision         General transfer comment: SPV for transfer. Able to stabilize independently at initial standing  Ambulation/Gait Ambulation/Gait assistance: Min guard Gait Distance (Feet): 100 Feet Assistive device: Rolling walker (2 wheeled) Gait Pattern/deviations: Step-through pattern;Trunk flexed     General Gait Details: Pt ambulating with shortened stride length bilaterally. Pt requesting to use RW due to feeling of weakness. Pt became diaphoretic during ambulation trial. >95% on 2L O2 throughout  Stairs            Wheelchair Mobility    Modified Rankin (Stroke Patients Only)       Balance Overall balance assessment: Needs assistance Sitting-balance support: No upper extremity supported;Feet supported Sitting balance-Leahy Scale: Good     Standing balance support: Bilateral upper extremity supported;During functional activity Standing balance-Leahy Scale: Fair Standing balance comment: Requiring B UE assist for dynamic balance during gait         Pertinent Vitals/Pain Pain Assessment: No/denies pain    Home Living Family/patient expects to be discharged to:: Private residence Living Arrangements: Spouse/significant other Available Help at Discharge: Family;Available 24 hours/day Type of Home: Apartment Home Access: Ramped entrance     Home Layout: One level Home Equipment: Grab bars - tub/shower;Hand held shower head Additional Comments: Lives with husband    Prior Function Level  of Independence: Independent         Comments: Independent without assistive device     Hand Dominance        Extremity/Trunk Assessment   Upper Extremity Assessment Upper Extremity Assessment: Overall WFL for tasks  assessed    Lower Extremity Assessment Lower Extremity Assessment: Overall WFL for tasks assessed    Cervical / Trunk Assessment Cervical / Trunk Assessment: Kyphotic (Increased forward head posture)  Communication   Communication: No difficulties  Cognition Arousal/Alertness: Awake/alert Behavior During Therapy: WFL for tasks assessed/performed Overall Cognitive Status: Within Functional Limits for tasks assessed         General Comments General comments (skin integrity, edema, etc.): Pt blood pressure monitored throughout session: sitting 119/77 with HR 86 bpm and oxygen saturation >94% on 2L. During ambulation, pt because diaphoretic and sweating profusely with minimal exertion. Pt requesting to sit and unable to obtain BP in standing.    Exercises     Assessment/Plan    PT Assessment Patient needs continued PT services  PT Problem List Decreased strength;Decreased activity tolerance;Decreased balance;Decreased coordination;Decreased mobility;Decreased knowledge of use of DME;Cardiopulmonary status limiting activity       PT Treatment Interventions DME instruction;Gait training;Stair training;Functional mobility training;Therapeutic activities;Therapeutic exercise;Balance training;Neuromuscular re-education;Patient/family education    PT Goals (Current goals can be found in the Care Plan section)  Acute Rehab PT Goals Patient Stated Goal: to go back home and to cardiac rehab PT Goal Formulation: With patient/family Time For Goal Achievement: 10/18/21 Potential to Achieve Goals: Good    Frequency Min 2X/week   Barriers to discharge        Co-evaluation               AM-PAC PT "6 Clicks" Mobility  Outcome Measure Help needed turning from your back to your side while in a flat bed without using bedrails?: None Help needed moving from lying on your back to sitting on the side of a flat bed without using bedrails?: None Help needed moving to and from a bed to a  chair (including a wheelchair)?: A Little Help needed standing up from a chair using your arms (e.g., wheelchair or bedside chair)?: A Little Help needed to walk in hospital room?: A Little Help needed climbing 3-5 steps with a railing? : A Lot 6 Click Score: 19    End of Session Equipment Utilized During Treatment: Gait belt;Oxygen Activity Tolerance: Patient tolerated treatment well Patient left: in bed;with call bell/phone within reach;with family/visitor present Nurse Communication: Mobility status PT Visit Diagnosis: Unsteadiness on feet (R26.81);History of falling (Z91.81)    Time: 1552-0802 PT Time Calculation (min) (ACUTE ONLY): 32 min   Charges:   PT Evaluation $PT Eval Low Complexity: 1 Low PT Treatments $Gait Training: 8-22 mins        Andrey Campanile, SPT   Andrey Campanile 10/04/2021, 12:35 PM

## 2021-10-04 NOTE — Progress Notes (Signed)
*  PRELIMINARY RESULTS* Echocardiogram 2D Echocardiogram has been performed.  Kelsey Carter 10/04/2021, 11:37 AM

## 2021-10-04 NOTE — Progress Notes (Signed)
Initial Nutrition Assessment  DOCUMENTATION CODES:   Not applicable  INTERVENTION:  -Ensure Enlive po BID, each supplement provides 350 kcal and 20 grams of protein -MVI with minerals daily -Placed CHF education in AVS discharge summary; follow-up as able for education reinforcement  NUTRITION DIAGNOSIS:   Increased nutrient needs related to chronic illness (CHF) as evidenced by estimated needs.  GOAL:   Patient will meet greater than or equal to 90% of their needs  MONITOR:   PO intake, Supplement acceptance, Labs, Weight trends, I & O's  REASON FOR ASSESSMENT:   Consult Other (Comment) ("nutritional goals")  ASSESSMENT:   Pt with PMH significant for breast Ca, CAD s/p stent, HTN, HLD, and Afib admitted with new onset CHF.  RD unable to obtain diet/weight history from pt at this time. Will attempt to gather information at follow-up. Provided CHF educational material in discharge summary and will follow-up as able to provide/reinforce education.  No PO Intake documented.   UOP: 1748ml x24 hours I/O: -1914ml since admit  Medications: colace, lasix Labs reviewed.   NUTRITION - FOCUSED PHYSICAL EXAM: Unable to complete at this time. Will attempt at follow-up.   Diet Order:   Diet Order             Diet Heart Room service appropriate? Yes; Fluid consistency: Thin; Fluid restriction: 1500 mL Fluid  Diet effective now                   EDUCATION NEEDS:   Education needs have been addressed  Skin:  Skin Assessment: Reviewed RN Assessment  Last BM:  PTA  Height:   Ht Readings from Last 1 Encounters:  10/03/21 5\' 5"  (1.651 m)    Weight:   Wt Readings from Last 10 Encounters:  10/04/21 64.3 kg  09/09/21 68 kg  06/17/21 68 kg  06/13/21 65.8 kg  06/04/21 64.4 kg  02/06/20 72.6 kg  01/29/20 75.1 kg  08/24/19 77.1 kg  08/03/19 78.3 kg  07/27/19 77.1 kg    BMI:  Body mass index is 23.6 kg/m.  Estimated Nutritional Needs:   Kcal:   1600-1800  Protein:  80-90 grams  Fluid:  >1.6L/d    Larkin Ina, MS, RD, LDN (she/her/hers) RD pager number and weekend/on-call pager number located in [place].

## 2021-10-04 NOTE — Progress Notes (Signed)
[facility]  SUBJECTIVE: Patient laying flat in bed, reports feeling better, less shortness of breath and palpitations   Vitals:   10/04/21 0145 10/04/21 0146 10/04/21 0538 10/04/21 0540  BP:  120/80  128/75  Pulse:  70 63 63  Resp:  18 18 18   Temp:  97.8 F (36.6 C) 97.8 F (36.6 C) 97.8 F (36.6 C)  TempSrc:  Oral Oral Oral  SpO2: 100% 98% 100% 100%  Weight:      Height:         Intake/Output Summary (Last 24 hours) at 10/04/2021 0912 Last data filed at 10/03/2021 1123 Gross per 24 hour  Intake --  Output 1700 ml  Net -1700 ml      PHYSICAL EXAM  General: Well developed, well nourished, in no acute distress HEENT:  Normocephalic and atramatic Neck:  No JVD.  Lungs: Clear bilaterally to auscultation and percussion. Heart: HRRR . Normal S1 and S2 without gallops or murmurs.  Abdomen: Bowel sounds are positive, abdomen soft and non-tender  Msk:  Back normal, normal gait. Normal strength and tone for age. Extremities: No clubbing, cyanosis or edema.   Neuro: Alert and oriented X 3. Psych:  Good affect, responds appropriately   LABS: Basic Metabolic Panel: Recent Labs    10/03/21 0752 10/04/21 0603  NA 140 138  K 4.6 3.6  CL 103 98  CO2 27 29  GLUCOSE 128* 95  BUN 16 18  CREATININE 0.83 1.04*  CALCIUM 9.4 9.4   Liver Function Tests: No results for input(s): AST, ALT, ALKPHOS, BILITOT, PROT, ALBUMIN in the last 72 hours. No results for input(s): LIPASE, AMYLASE in the last 72 hours. CBC: Recent Labs    10/03/21 0752 10/04/21 0603  WBC 5.2 5.7  HGB 11.6* 12.6  HCT 33.6* 36.8  MCV 100.9* 99.2  PLT 322 314   Cardiac Enzymes: No results for input(s): CKTOTAL, CKMB, CKMBINDEX, TROPONINI in the last 72 hours. BNP: Invalid input(s): POCBNP D-Dimer: Recent Labs    10/03/21 0756  DDIMER 0.46   Hemoglobin A1C: No results for input(s): HGBA1C in the last 72 hours. Fasting Lipid Panel: No results for input(s): CHOL, HDL, LDLCALC, TRIG, CHOLHDL,  LDLDIRECT in the last 72 hours. Thyroid Function Tests: Recent Labs    10/03/21 0756  TSH 11.568*   Anemia Panel: No results for input(s): VITAMINB12, FOLATE, FERRITIN, TIBC, IRON, RETICCTPCT in the last 72 hours.  DG Chest Portable 1 View  Result Date: 10/03/2021 CLINICAL DATA:  Chest pain EXAM: PORTABLE CHEST 1 VIEW COMPARISON:  07/21/2019 FINDINGS: Mild cardiac enlargement. Pulmonary vascular congestion. Mild interstitial edema bilaterally. No pleural effusion. IMPRESSION: Congestive heart failure with mild edema. Electronically Signed   By: Franchot Gallo M.D.   On: 10/03/2021 07:56     Echo pending  TELEMETRY: Sinus rhythm with LBBB:  ASSESSMENT AND PLAN:  Principal Problem:   New onset of congestive heart failure (HCC) Active Problems:   Bilateral breast cancer (HCC)   Hyperlipidemia   Benign essential HTN   Coronary artery disease involving native coronary artery of native heart   PAF (paroxysmal atrial fibrillation) (HCC)    Acute diastolic CHF, presenting with a several day history of generalized fatigue, daytime somnolence, and a cough that started last night. BNP elevated; high sensitivity troponin normal. Chest xray with mild interstitial edema. Patient placed on supplemental oxygen and IV Lasix. 2D echocardiogram 08/05/2021 revealed normal left ventricular function. Paroxysmal atrial fibrillation, on amiodarone and Eliquis, with heart racing and palpitations that  woke her morning of admission,  resolved after taking morning medications. Currently in sinus rhythm. Coronary artery disease, status post DES proximal LAD on 08/02/2019 on statin; not on aspirin. Recent Lexiscan Myoview in 07/2021 negative for ischemia. 4.   Hypertension, elevated this morning, improved now, currently on spironolactone, metoprolol succinate and irbesartan  Recommendations  1.  Continue current medications 2.  Continue diuresis 3.  Carefully monitor renal status 4.  Continue Eliquis for  stroke prevention 5.  Continue amiodarone and metoprolol succinate for rate and rhythm control 6.  Review 2D echocardiogram    Isaias Cowman, MD, PhD, [facility] 10/04/2021 9:12 AM

## 2021-10-05 DIAGNOSIS — I509 Heart failure, unspecified: Secondary | ICD-10-CM | POA: Diagnosis not present

## 2021-10-05 LAB — BASIC METABOLIC PANEL
Anion gap: 10 (ref 5–15)
BUN: 38 mg/dL — ABNORMAL HIGH (ref 8–23)
CO2: 28 mmol/L (ref 22–32)
Calcium: 9.4 mg/dL (ref 8.9–10.3)
Chloride: 96 mmol/L — ABNORMAL LOW (ref 98–111)
Creatinine, Ser: 2.01 mg/dL — ABNORMAL HIGH (ref 0.44–1.00)
GFR, Estimated: 25 mL/min — ABNORMAL LOW (ref >60)
Glucose, Bld: 102 mg/dL — ABNORMAL HIGH (ref 70–99)
Potassium: 3.8 mmol/L (ref 3.5–5.1)
Sodium: 134 mmol/L — ABNORMAL LOW (ref 135–145)

## 2021-10-05 MED ORDER — METOPROLOL SUCCINATE ER 25 MG PO TB24
12.5000 mg | ORAL_TABLET | Freq: Two times a day (BID) | ORAL | Status: DC
  Administered 2021-10-05 – 2021-10-07 (×4): 12.5 mg via ORAL
  Filled 2021-10-05 (×4): qty 1

## 2021-10-05 NOTE — Progress Notes (Signed)
[facility]  SUBJECTIVE: Shin laying flat in bed, reports feeling well, denies chest pain or shortness of breath   Vitals:   10/04/21 2002 10/04/21 2351 10/05/21 0331 10/05/21 0755  BP: 126/81 121/71 123/74 (!) 111/55  Pulse: 80 65 73 66  Resp: 18 17 19 18   Temp: 97.8 F (36.6 C) 97.6 F (36.4 C) 97.6 F (36.4 C) (!) 97.5 F (36.4 C)  TempSrc: Oral     SpO2: 94% 97% 98% 100%  Weight:   66.6 kg   Height:         Intake/Output Summary (Last 24 hours) at 10/05/2021 0949 Last data filed at 10/05/2021 0330 Gross per 24 hour  Intake 480 ml  Output 350 ml  Net 130 ml      PHYSICAL EXAM  General: Well developed, well nourished, in no acute distress HEENT:  Normocephalic and atramatic Neck:  No JVD.  Lungs: Clear bilaterally to auscultation and percussion. Heart: HRRR . Normal S1 and S2 without gallops or murmurs.  Abdomen: Bowel sounds are positive, abdomen soft and non-tender  Msk:  Back normal, normal gait. Normal strength and tone for age. Extremities: No clubbing, cyanosis or edema.   Neuro: Alert and oriented X 3. Psych:  Good affect, responds appropriately   LABS: Basic Metabolic Panel: Recent Labs    10/04/21 0603 10/05/21 0553  NA 138 134*  K 3.6 3.8  CL 98 96*  CO2 29 28  GLUCOSE 95 102*  BUN 18 38*  CREATININE 1.04* 2.01*  CALCIUM 9.4 9.4   Liver Function Tests: No results for input(s): AST, ALT, ALKPHOS, BILITOT, PROT, ALBUMIN in the last 72 hours. No results for input(s): LIPASE, AMYLASE in the last 72 hours. CBC: Recent Labs    10/03/21 0752 10/04/21 0603  WBC 5.2 5.7  HGB 11.6* 12.6  HCT 33.6* 36.8  MCV 100.9* 99.2  PLT 322 314   Cardiac Enzymes: No results for input(s): CKTOTAL, CKMB, CKMBINDEX, TROPONINI in the last 72 hours. BNP: Invalid input(s): POCBNP D-Dimer: Recent Labs    10/03/21 0756  DDIMER 0.46   Hemoglobin A1C: No results for input(s): HGBA1C in the last 72 hours. Fasting Lipid Panel: No results for  input(s): CHOL, HDL, LDLCALC, TRIG, CHOLHDL, LDLDIRECT in the last 72 hours. Thyroid Function Tests: Recent Labs    10/03/21 0756  TSH 11.568*   Anemia Panel: No results for input(s): VITAMINB12, FOLATE, FERRITIN, TIBC, IRON, RETICCTPCT in the last 72 hours.  ECHOCARDIOGRAM COMPLETE  Result Date: 10/04/2021    ECHOCARDIOGRAM REPORT   Patient Name:   Kelsey Carter Date of Exam: 10/04/2021 Medical Rec #:  [id number]          Height:       65.0 in Accession #:    [id number]         Weight:       148.0 lb Date of Birth:  Mar 09, 1946         BSA:          1.741 m Patient Age:    75 years           BP:           128/75 mmHg Patient Gender: F                  HR:           64 bpm. Exam Location:  [facility] Procedure: 2D Echo, Cardiac Doppler and Color Doppler Indications:     CHF-Acute Systolic  428.21 / I50.21  History:         Patient has no prior history of Echocardiogram examinations.                  CHF, Arrythmias:Atrial Fibrillation; Risk Factors:Hypertension.  Sonographer:     Alyse Low Roar Referring Phys:  [place] Diagnosing Phys: Isaias Cowman MD IMPRESSIONS  1. Left ventricular ejection fraction, by estimation, is 45 to 50%. The left ventricle has mildly decreased function. The left ventricle has no regional wall motion abnormalities. There is mild left ventricular hypertrophy. Left ventricular diastolic parameters are consistent with Grade I diastolic dysfunction (impaired relaxation).  2. Right ventricular systolic function is normal. The right ventricular size is normal.  3. The mitral valve is normal in structure. Mild mitral valve regurgitation. No evidence of mitral stenosis.  4. The aortic valve is normal in structure. Aortic valve regurgitation is not visualized. No aortic stenosis is present.  5. The inferior vena cava is normal in size with greater than 50% respiratory variability, suggesting right atrial pressure of 3 mmHg. FINDINGS  Left Ventricle: Left ventricular  ejection fraction, by estimation, is 45 to 50%. The left ventricle has mildly decreased function. The left ventricle has no regional wall motion abnormalities. The left ventricular internal cavity size was normal in size. There is mild left ventricular hypertrophy. Left ventricular diastolic parameters are consistent with Grade I diastolic dysfunction (impaired relaxation). Right Ventricle: The right ventricular size is normal. No increase in right ventricular wall thickness. Right ventricular systolic function is normal. Left Atrium: Left atrial size was normal in size. Right Atrium: Right atrial size was normal in size. Pericardium: There is no evidence of pericardial effusion. Mitral Valve: The mitral valve is normal in structure. Mild mitral valve regurgitation. No evidence of mitral valve stenosis. Tricuspid Valve: The tricuspid valve is normal in structure. Tricuspid valve regurgitation is mild . No evidence of tricuspid stenosis. Aortic Valve: The aortic valve is normal in structure. Aortic valve regurgitation is not visualized. No aortic stenosis is present. Aortic valve peak gradient measures 4.0 mmHg. Pulmonic Valve: The pulmonic valve was normal in structure. Pulmonic valve regurgitation is not visualized. No evidence of pulmonic stenosis. Aorta: The aortic root is normal in size and structure. Venous: The inferior vena cava is normal in size with greater than 50% respiratory variability, suggesting right atrial pressure of 3 mmHg. IAS/Shunts: No atrial level shunt detected by color flow Doppler.  LEFT VENTRICLE PLAX 2D LVIDd:         4.04 cm   Diastology LVIDs:         3.15 cm   LV e' medial:    3.15 cm/s LV PW:         1.03 cm   LV E/e' medial:  12.3 LV IVS:        1.41 cm   LV e' lateral:   5.87 cm/s LVOT diam:     1.90 cm   LV E/e' lateral: 6.6 LVOT Area:     2.84 cm  RIGHT VENTRICLE RV Mid diam:    2.48 cm RV S prime:     9.46 cm/s LEFT ATRIUM             Index        RIGHT ATRIUM           Index LA  diam:        3.60 cm 2.07 cm/m   RA Area:     12.10 cm  LA Vol (A2C):   44.4 ml 25.51 ml/m  RA Volume:   24.60 ml  14.13 ml/m LA Vol (A4C):   49.4 ml 28.38 ml/m LA Biplane Vol: 47.3 ml 27.18 ml/m  AORTIC VALVE                 PULMONIC VALVE AV Area (Vmax): 2.16 cm     PV Vmax:          0.86 m/s AV Vmax:        100.00 cm/s  PV Peak grad:     2.9 mmHg AV Peak Grad:   4.0 mmHg     PR End Diast Vel: 2.48 msec LVOT Vmax:      76.30 cm/s   RVOT Peak grad:   3 mmHg  AORTA Ao Root diam: 3.70 cm MITRAL VALVE               TRICUSPID VALVE MV Area (PHT): 4.10 cm    TR Peak grad:   17.6 mmHg MV Decel Time: 185 msec    TR Vmax:        210.00 cm/s MV E velocity: 38.60 cm/s MV A velocity: 66.40 cm/s  SHUNTS MV E/A ratio:  0.58        Systemic Diam: 1.90 cm MV A Prime:    8.2 cm/s Isaias Cowman MD Electronically signed by Isaias Cowman MD Signature Date/Time: 10/04/2021/12:30:51 PM    Final      Echo LVEF 45 to 50%  TELEMETRY: Sinus rhythm with left bundle branch block at 91 bpm:  ASSESSMENT AND PLAN:  Principal Problem:   New onset of congestive heart failure (HCC) Active Problems:   Bilateral breast cancer (HCC)   Hyperlipidemia   Benign essential HTN   Coronary artery disease involving native coronary artery of native heart   PAF (paroxysmal atrial fibrillation) ([place])    1. Acute diastolic CHF, presenting with a several day history of generalized fatigue, daytime somnolence, and a cough that started last night. BNP elevated; high sensitivity troponin normal. Chest xray with mild interstitial edema. Patient placed on supplemental oxygen and IV Lasix. 2D echocardiogram 08/04/2021 revealed near normal left ventricular function, with LVEF 45 to 50%.  Patient had net output 1700 cc yesterday, appears euvolemic. Paroxysmal atrial fibrillation, on amiodarone and Eliquis, with heart racing and palpitations that woke her morning of admission,  resolved after taking morning medications. Currently in  sinus rhythm. Coronary artery disease, status post DES proximal LAD on 08/02/2019 on statin; not on aspirin. Recent Lexiscan Myoview in 07/2021 negative for ischemia. 4.   Hypertension, blood pressure in normal range currently on spironolactone, metoprolol succinate and irbesartan  Recommendations  1.  Continue current medications 2.  Continue Eliquis for stroke prevention 3.  Continue amiodarone and metoprolol succinate for rate and rhythm control 4.  No further cardiac diagnostics at this time 5.  Follow-up with Dr. Nehemiah Massed in 1 to 2 weeks  Sign off for now, please call if any questions   Isaias Cowman, MD, PhD, [facility] 10/05/2021 9:49 AM

## 2021-10-05 NOTE — Progress Notes (Addendum)
PROGRESS NOTE    LYNELLE WEILER  PXT:[id number] DOB: 1946/01/31 DOA: 10/03/2021 PCP: Tracie Harrier, MD    Brief Narrative:  Kelsey Carter is a 75 y.o. female with medical history significant of breast cancer; CAD s/p stent; HTN; HLD; and afib on Eliquis presenting with SOB/palpitations.   10/16 reports her sob/DOE has improved. No new complaints. Cr up 2.0     Consultants:  Cardiology  Procedures:   Antimicrobials:      Subjective: No dizziness or lightheadedness  Objective: Vitals:   10/04/21 2002 10/04/21 2351 10/05/21 0331 10/05/21 0755  BP: 126/81 121/71 123/74 (!) 111/55  Pulse: 80 65 73 66  Resp: 18 17 19 18   Temp: 97.8 F (36.6 C) 97.6 F (36.4 C) 97.6 F (36.4 C) (!) 97.5 F (36.4 C)  TempSrc: Oral     SpO2: 94% 97% 98% 100%  Weight:   66.6 kg   Height:        Intake/Output Summary (Last 24 hours) at 10/05/2021 0843 Last data filed at 10/05/2021 0330 Gross per 24 hour  Intake 480 ml  Output 350 ml  Net 130 ml   Filed Weights   10/03/21 0711 10/04/21 1530 10/05/21 0331  Weight: 67.1 kg 64.3 kg 66.6 kg    Examination: NAD, calm CTA no wheeze rales Regular S1-S2 no gallops Soft benign positive bowel sounds No edema Aaxox3 Mood and affect appropriate in current setting   Data Reviewed: I have personally reviewed following labs and imaging studies  CBC: Recent Labs  Lab 10/03/21 0752 10/04/21 0603  WBC 5.2 5.7  HGB 11.6* 12.6  HCT 33.6* 36.8  MCV 100.9* 99.2  PLT 322 627   Basic Metabolic Panel: Recent Labs  Lab 10/03/21 0752 10/04/21 0603 10/05/21 0553  NA 140 138 134*  K 4.6 3.6 3.8  CL 103 98 96*  CO2 27 29 28   GLUCOSE 128* 95 102*  BUN 16 18 38*  CREATININE 0.83 1.04* 2.01*  CALCIUM 9.4 9.4 9.4   GFR: Estimated Creatinine Clearance: 21.4 mL/min (A) (by C-G formula based on SCr of 2.01 mg/dL (H)). Liver Function Tests: No results for input(s): AST, ALT, ALKPHOS, BILITOT, PROT, ALBUMIN in the last  168 hours. No results for input(s): LIPASE, AMYLASE in the last 168 hours. No results for input(s): AMMONIA in the last 168 hours. Coagulation Profile: No results for input(s): INR, PROTIME in the last 168 hours. Cardiac Enzymes: No results for input(s): CKTOTAL, CKMB, CKMBINDEX, TROPONINI in the last 168 hours. BNP (last 3 results) No results for input(s): PROBNP in the last 8760 hours. HbA1C: No results for input(s): HGBA1C in the last 72 hours. CBG: No results for input(s): GLUCAP in the last 168 hours. Lipid Profile: No results for input(s): CHOL, HDL, LDLCALC, TRIG, CHOLHDL, LDLDIRECT in the last 72 hours. Thyroid Function Tests: Recent Labs    10/03/21 0756  TSH 11.568*   Anemia Panel: No results for input(s): VITAMINB12, FOLATE, FERRITIN, TIBC, IRON, RETICCTPCT in the last 72 hours. Sepsis Labs: No results for input(s): PROCALCITON, LATICACIDVEN in the last 168 hours.  Recent Results (from the past 240 hour(s))  Resp Panel by RT-PCR (Flu A&B, Covid) Nasopharyngeal Swab     Status: None   Collection Time: 10/03/21  8:39 AM   Specimen: Nasopharyngeal Swab; Nasopharyngeal(NP) swabs in vial transport medium  Result Value Ref Range Status   SARS Coronavirus 2 by RT PCR NEGATIVE NEGATIVE Final    Comment: (NOTE) SARS-CoV-2 target nucleic acids are NOT DETECTED.  The SARS-CoV-2 RNA is generally detectable in upper respiratory specimens during the acute phase of infection. The lowest concentration of SARS-CoV-2 viral copies this assay can detect is 138 copies/mL. A negative result does not preclude SARS-Cov-2 infection and should not be used as the sole basis for treatment or other patient management decisions. A negative result may occur with  improper specimen collection/handling, submission of specimen other than nasopharyngeal swab, presence of viral mutation(s) within the areas targeted by this assay, and inadequate number of viral copies(<138 copies/mL). A negative  result must be combined with clinical observations, patient history, and epidemiological information. The expected result is Negative.  Fact Sheet for Patients:  EntrepreneurPulse.com.au  Fact Sheet for Healthcare Providers:  IncredibleEmployment.be  This test is no t yet approved or cleared by the [place] FDA and  has been authorized for detection and/or diagnosis of SARS-CoV-2 by FDA under an Emergency Use Authorization (EUA). This EUA will remain  in effect (meaning this test can be used) for the duration of the COVID-19 declaration under Section 564(b)(1) of the Act, 21 U.S.C.section 360bbb-3(b)(1), unless the authorization is terminated  or revoked sooner.       Influenza A by PCR NEGATIVE NEGATIVE Final   Influenza B by PCR NEGATIVE NEGATIVE Final    Comment: (NOTE) The Xpert Xpress SARS-CoV-2/FLU/RSV plus assay is intended as an aid in the diagnosis of influenza from Nasopharyngeal swab specimens and should not be used as a sole basis for treatment. Nasal washings and aspirates are unacceptable for Xpert Xpress SARS-CoV-2/FLU/RSV testing.  Fact Sheet for Patients: EntrepreneurPulse.com.au  Fact Sheet for Healthcare Providers: IncredibleEmployment.be  This test is not yet approved or cleared by the [place] FDA and has been authorized for detection and/or diagnosis of SARS-CoV-2 by FDA under an Emergency Use Authorization (EUA). This EUA will remain in effect (meaning this test can be used) for the duration of the COVID-19 declaration under Section 564(b)(1) of the Act, 21 U.S.C. section 360bbb-3(b)(1), unless the authorization is terminated or revoked.  Performed at [facility], [address] [place], [place] [address]          Radiology Studies: ECHOCARDIOGRAM COMPLETE  Result Date: 10/04/2021    ECHOCARDIOGRAM REPORT   Patient Name:   Kelsey Carter Date  of Exam: 10/04/2021 Medical Rec #:  [id number]          Height:       65.0 in Accession #:    [id number]         Weight:       148.0 lb Date of Birth:  1946-10-02         BSA:          1.741 m Patient Age:    16 years           BP:           128/75 mmHg Patient Gender: F                  HR:           64 bpm. Exam Location:  [facility] Procedure: 2D Echo, Cardiac Doppler and Color Doppler Indications:     CHF-Acute Systolic 419.62 / I29.79  History:         Patient has no prior history of Echocardiogram examinations.                  CHF, Arrythmias:Atrial Fibrillation; Risk Factors:Hypertension.  Sonographer:     Alyse Low Roar Referring  Phys:  [place] Diagnosing Phys: Isaias Cowman MD IMPRESSIONS  1. Left ventricular ejection fraction, by estimation, is 45 to 50%. The left ventricle has mildly decreased function. The left ventricle has no regional wall motion abnormalities. There is mild left ventricular hypertrophy. Left ventricular diastolic parameters are consistent with Grade I diastolic dysfunction (impaired relaxation).  2. Right ventricular systolic function is normal. The right ventricular size is normal.  3. The mitral valve is normal in structure. Mild mitral valve regurgitation. No evidence of mitral stenosis.  4. The aortic valve is normal in structure. Aortic valve regurgitation is not visualized. No aortic stenosis is present.  5. The inferior vena cava is normal in size with greater than 50% respiratory variability, suggesting right atrial pressure of 3 mmHg. FINDINGS  Left Ventricle: Left ventricular ejection fraction, by estimation, is 45 to 50%. The left ventricle has mildly decreased function. The left ventricle has no regional wall motion abnormalities. The left ventricular internal cavity size was normal in size. There is mild left ventricular hypertrophy. Left ventricular diastolic parameters are consistent with Grade I diastolic dysfunction (impaired relaxation). Right Ventricle:  The right ventricular size is normal. No increase in right ventricular wall thickness. Right ventricular systolic function is normal. Left Atrium: Left atrial size was normal in size. Right Atrium: Right atrial size was normal in size. Pericardium: There is no evidence of pericardial effusion. Mitral Valve: The mitral valve is normal in structure. Mild mitral valve regurgitation. No evidence of mitral valve stenosis. Tricuspid Valve: The tricuspid valve is normal in structure. Tricuspid valve regurgitation is mild . No evidence of tricuspid stenosis. Aortic Valve: The aortic valve is normal in structure. Aortic valve regurgitation is not visualized. No aortic stenosis is present. Aortic valve peak gradient measures 4.0 mmHg. Pulmonic Valve: The pulmonic valve was normal in structure. Pulmonic valve regurgitation is not visualized. No evidence of pulmonic stenosis. Aorta: The aortic root is normal in size and structure. Venous: The inferior vena cava is normal in size with greater than 50% respiratory variability, suggesting right atrial pressure of 3 mmHg. IAS/Shunts: No atrial level shunt detected by color flow Doppler.  LEFT VENTRICLE PLAX 2D LVIDd:         4.04 cm   Diastology LVIDs:         3.15 cm   LV e' medial:    3.15 cm/s LV PW:         1.03 cm   LV E/e' medial:  12.3 LV IVS:        1.41 cm   LV e' lateral:   5.87 cm/s LVOT diam:     1.90 cm   LV E/e' lateral: 6.6 LVOT Area:     2.84 cm  RIGHT VENTRICLE RV Mid diam:    2.48 cm RV S prime:     9.46 cm/s LEFT ATRIUM             Index        RIGHT ATRIUM           Index LA diam:        3.60 cm 2.07 cm/m   RA Area:     12.10 cm LA Vol (A2C):   44.4 ml 25.51 ml/m  RA Volume:   24.60 ml  14.13 ml/m LA Vol (A4C):   49.4 ml 28.38 ml/m LA Biplane Vol: 47.3 ml 27.18 ml/m  AORTIC VALVE  PULMONIC VALVE AV Area (Vmax): 2.16 cm     PV Vmax:          0.86 m/s AV Vmax:        100.00 cm/s  PV Peak grad:     2.9 mmHg AV Peak Grad:   4.0 mmHg     PR End  Diast Vel: 2.48 msec LVOT Vmax:      76.30 cm/s   RVOT Peak grad:   3 mmHg  AORTA Ao Root diam: 3.70 cm MITRAL VALVE               TRICUSPID VALVE MV Area (PHT): 4.10 cm    TR Peak grad:   17.6 mmHg MV Decel Time: 185 msec    TR Vmax:        210.00 cm/s MV E velocity: 38.60 cm/s MV A velocity: 66.40 cm/s  SHUNTS MV E/A ratio:  0.58        Systemic Diam: 1.90 cm MV A Prime:    8.2 cm/s Isaias Cowman MD Electronically signed by Isaias Cowman MD Signature Date/Time: 10/04/2021/12:30:51 PM    Final         Scheduled Meds:  amiodarone  200 mg Oral BID   apixaban  5 mg Oral BID   aspirin EC  81 mg Oral Daily   atorvastatin  10 mg Oral Daily   docusate sodium  100 mg Oral BID   feeding supplement  237 mL Oral BID BM   levothyroxine  37.5 mcg Oral Q0600   metoprolol succinate  25 mg Oral q AM   midodrine  5 mg Oral TID WC   multivitamin with minerals  1 tablet Oral Q1200   sodium chloride flush  3 mL Intravenous Q12H   Continuous Infusions:  Assessment & Plan:   Principal Problem:   New onset of congestive heart failure (HCC) Active Problems:   Bilateral breast cancer (HCC)   Hyperlipidemia   Benign essential HTN   Coronary artery disease involving native coronary artery of native heart   PAF (paroxysmal atrial fibrillation) ([place])   New onset of acute systolic heart failure  Echo today with EF 45 to 50%.  No regional wall motion abnormalities.  Grade 1 diastolic dysfunction. 10/16cardiology following Hold ARB, spironolactone, Lasix due to low BP and AKI Clinically has improved.  Have encouraged her to do some p.o. hydration for her creatinine if it improves will DC tomorrow    PAF (paroxysmal atrial fibrillation) (HCC) -Rate controlled with Amiodarone and metoprolol 10/16 rate control Continue Eliquis and beta-blockers   Coronary artery disease involving native coronary artery of native heart Status post stent -10/16 on Eliquis so not on aspirin Continue  beta-blockers  AKI Due to diuresis. Creatinine up at 2 Encouraged po hydration Monitor level, if trends down in am will dc home   Benign essential HTN BP on low side.  Lasix DC'd.  Started on midodrine as she needs her beta-blockers.  Beta-blocker dose has been decreased.   Hyperlipidemia -Recent LDL was 93, goal <70 -Lipitor 10 mg daily was added   Bilateral breast cancer ([place]) -s/p B mastectomy -No longer taking anastrazole -Last f/u was in 08/2018, needs outpatient f/u        DVT prophylaxis: Eliquis Code Status: DNR Family Communication: None at bedside Disposition Plan:  Status is: Inpatient  Remains inpatient appropriate because:Inpatient level of care appropriate due to severity of illness   Dispo: The patient is from: Home  Anticipated d/c is to: Home              Patient currently is not medically stable to d/c.              Difficult to place patient No            LOS: 2 days   Time spent: 45 minutes with more than 50% on [place], MD [facility] Pager [phone]  If 7PM-7AM, please contact night-coverage 10/05/2021, 8:43 AM

## 2021-10-06 ENCOUNTER — Inpatient Hospital Stay: Payer: PPO

## 2021-10-06 ENCOUNTER — Encounter: Payer: Self-pay | Admitting: Internal Medicine

## 2021-10-06 DIAGNOSIS — I509 Heart failure, unspecified: Secondary | ICD-10-CM | POA: Diagnosis not present

## 2021-10-06 DIAGNOSIS — R3914 Feeling of incomplete bladder emptying: Secondary | ICD-10-CM | POA: Diagnosis not present

## 2021-10-06 DIAGNOSIS — N289 Disorder of kidney and ureter, unspecified: Secondary | ICD-10-CM | POA: Diagnosis not present

## 2021-10-06 DIAGNOSIS — N1330 Unspecified hydronephrosis: Secondary | ICD-10-CM | POA: Diagnosis not present

## 2021-10-06 LAB — BASIC METABOLIC PANEL
Anion gap: 9 (ref 5–15)
BUN: 44 mg/dL — ABNORMAL HIGH (ref 8–23)
CO2: 30 mmol/L (ref 22–32)
Calcium: 9.5 mg/dL (ref 8.9–10.3)
Chloride: 94 mmol/L — ABNORMAL LOW (ref 98–111)
Creatinine, Ser: 2.21 mg/dL — ABNORMAL HIGH (ref 0.44–1.00)
GFR, Estimated: 23 mL/min — ABNORMAL LOW (ref >60)
Glucose, Bld: 108 mg/dL — ABNORMAL HIGH (ref 70–99)
Potassium: 3.9 mmol/L (ref 3.5–5.1)
Sodium: 133 mmol/L — ABNORMAL LOW (ref 135–145)

## 2021-10-06 MED ORDER — SODIUM CHLORIDE 0.9 % IV SOLN: INTRAVENOUS | Status: DC

## 2021-10-06 NOTE — Care Management Important Message (Signed)
Important Message  Patient Details  Name: MAKYNLEE KRESSIN MRN: [id number] Date of Birth: 07-May-1946   Medicare Important Message Given:  Yes     Dannette Barbara 10/06/2021, 2:30 PM

## 2021-10-06 NOTE — Consult Note (Signed)
Subjective:    Consult requested by Kristine Royal MD  I was asked to see Kelsey Carter for mild left hydro found on an [place] for renal insufficiency with a Cr of 2.21.  She had no pain or prior history of stones.   I requested a CT stone study and no hydro was noted.   She has some chronic frequency and nocturia and her bladder has a moderate PVR.   She has no significant prior GU history.    ROS:  Review of Systems  Gastrointestinal:  Negative for abdominal pain.  Genitourinary:  Positive for frequency (and nocturia.). Negative for flank pain.   Allergies  Allergen Reactions   Hydrocodone-Acetaminophen Nausea And Vomiting    Severe vomitting   Morphine And Related Nausea And Vomiting    Pt preference    Ciprofloxacin Other (See Comments)    Joint pain    Gabapentin Other (See Comments)    Eyes blurry; vision decreased, dizziness     Past Medical History:  Diagnosis Date   Anemia    vitamin d deficiency   Anxiety    Breast cancer ([place])    bilateral mastectomies.  different types of cancer in each breast   Cardiomyopathy due to chemotherapy [facility]) 2011   Chronic anticoagulation    Apixaban   Chronic pain 2019   Chronic SI joint pain    Coronary artery disease    DDD (degenerative disc disease), lumbar    First degree AV block    GERD (gastroesophageal reflux disease)    Hyperlipidemia    Hypertension    Insomnia    Lumbar herniated disc    Neuropathy    Paroxysmal atrial fibrillation (HCC)    Personal history of chemotherapy    Personal history of radiation therapy    S/P drug eluting coronary stent placement 08/02/2019   2.5 x 12 mm Resolute Onyx to pLAD   Valvular insufficiency    Mild    Past Surgical History:  Procedure Laterality Date   APPENDECTOMY  1961   BREAST IMPLANT REMOVAL Bilateral 2016   d/t infection. nothing replaced   CARDIAC CATHETERIZATION  2018   no blockages. pt thought it was an MI, but it wasn't   CESAREAN SECTION     x 3   CORONARY STENT  INTERVENTION N/A 08/02/2019   Procedure: CORONARY STENT INTERVENTION;  Surgeon: Wellington Hampshire, MD;  Location: [place] [facility];  Service: Cardiovascular;  Laterality: N/A;   EYE SURGERY Bilateral 2013   cataract extractions with iol. cornea replacement also   INTRAVASCULAR PRESSURE WIRE/FFR STUDY N/A 08/02/2019   Procedure: INTRAVASCULAR PRESSURE WIRE/FFR STUDY;  Surgeon: Wellington Hampshire, MD;  Location: [place] [facility];  Service: Cardiovascular;  Laterality: N/A;   KYPHOPLASTY N/A 06/17/2021   Procedure: L2 KYPHOPLASTY;  Surgeon: Hessie Knows, MD;  Location: [facility] ORS;  Service: Orthopedics;  Laterality: N/A;   LEFT HEART CATH AND CORONARY ANGIOGRAPHY Left 07/27/2019   Procedure: LEFT HEART CATH AND CORONARY ANGIOGRAPHY;  Surgeon: Corey Skains, MD;  Location: [place] [facility];  Service: Cardiovascular;  Laterality: Left;   LUMBAR LAMINECTOMY/DECOMPRESSION MICRODISCECTOMY N/A 06/29/2018   Procedure: LUMBAR LAMINECTOMY/DECOMPRESSION MICRODISCECTOMY 1 LEVEL-L4-5, L5-S1 LATERAL DISCECTOMY;  Surgeon: Meade Maw, MD;  Location: [facility] OR[facility];  Service: Neurosurgery;  Laterality: N/A;   MASTECTOMY Bilateral 2011, 2016   5 diff procedures. reconstruction saline removed d/t infx   nsvd     x 1   PLACEMENT OF BREAST IMPLANTS Bilateral 2016  PORT-A-CATH REMOVAL  2016   TONSILLECTOMY      Social History   Socioeconomic History   Marital status: Married    Spouse name: Eddie Dibbles   Number of children: 4   Years of education: 1 year of college   Highest education level: 12th grade  Occupational History   Occupation: Retired  Tobacco Use   Smoking status: Former    Years: 3.00    Types: Cigarettes   Smokeless tobacco: Never  Vaping Use   Vaping Use: Never used  Substance and Sexual Activity   Alcohol use: No   Drug use: Yes    Comment: prescribed narcotics for back pain   Sexual activity: Not Currently  Other Topics Concern   Not on file  Social History Narrative   Not  on file   Social Determinants of Health   Financial Resource Strain: Not on file  Food Insecurity: Not on file  Transportation Needs: Not on file  Physical Activity: Not on file  Stress: Not on file  Social Connections: Not on file  Intimate Partner Violence: Not on file    Family History  Problem Relation Age of Onset   Heart failure Mother    Hypertension Mother    Cervical cancer Mother    Renal Disease Mother    Cancer Father    Tuberculosis Maternal Grandfather    Breast cancer Paternal Grandmother    Breast cancer Paternal Aunt     Anti-infectives: Anti-infectives (From admission, onward)    None       Current Facility-Administered Medications  Medication Dose Route Frequency Provider Last Rate Last Admin   0.9 %  sodium chloride infusion   Intravenous Continuous Nolberto Hanlon, MD 50 mL/hr at 10/06/21 1040 New Bag at 10/06/21 1040   acetaminophen (TYLENOL) tablet 650 mg  650 mg Oral Q6H PRN Karmen Bongo, MD       Or   acetaminophen (TYLENOL) suppository 650 mg  650 mg Rectal Q6H PRN Karmen Bongo, MD       amiodarone (PACERONE) tablet 200 mg  200 mg Oral BID Karmen Bongo, MD   200 mg at 10/06/21 1042   apixaban (ELIQUIS) tablet 5 mg  5 mg Oral BID Karmen Bongo, MD   5 mg at 10/06/21 1042   aspirin EC tablet 81 mg  81 mg Oral Daily Clabe Seal, PA-C   81 mg at 10/06/21 1042   atorvastatin (LIPITOR) tablet 10 mg  10 mg Oral Daily Karmen Bongo, MD   10 mg at 10/06/21 1042   bisacodyl (DULCOLAX) EC tablet 5 mg  5 mg Oral Daily PRN Karmen Bongo, MD       docusate sodium (COLACE) capsule 100 mg  100 mg Oral BID Karmen Bongo, MD   100 mg at 10/06/21 1042   feeding supplement (ENSURE ENLIVE / ENSURE PLUS) liquid 237 mL  237 mL Oral BID BM Karmen Bongo, MD       levothyroxine (SYNTHROID) tablet 37.5 mcg  37.5 mcg Oral Q0600 Karmen Bongo, MD   37.5 mcg at 10/06/21 0532   metoprolol succinate (TOPROL-XL) 24 hr tablet 12.5 mg  12.5 mg Oral BID Nolberto Hanlon, MD   12.5 mg at 10/06/21 1042   multivitamin with minerals tablet 1 tablet  1 tablet Oral Q1200 Karmen Bongo, MD   1 tablet at 10/06/21 1242   nitroGLYCERIN (NITROSTAT) SL tablet 0.4 mg  0.4 mg Sublingual Q5 min PRN Karmen Bongo, MD   0.4 mg at 10/03/21 [phone]  ondansetron (ZOFRAN) tablet 4 mg  4 mg Oral Q6H PRN Karmen Bongo, MD       Or   ondansetron [facility]) injection 4 mg  4 mg Intravenous Q6H PRN Karmen Bongo, MD       oxyCODONE (Oxy IR/ROXICODONE) immediate release tablet 5 mg  5 mg Oral Q4H PRN Karmen Bongo, MD       polyethylene glycol (MIRALAX / GLYCOLAX) packet 17 g  17 g Oral Daily PRN Karmen Bongo, MD       sodium chloride flush (NS) 0.9 % injection 3 mL  3 mL Intravenous Q12H Karmen Bongo, MD   3 mL at 10/06/21 1043   traZODone (DESYREL) tablet 25 mg  25 mg Oral QHS PRN Karmen Bongo, MD         Objective: Vital signs in last 24 hours: BP (!) 153/83 (BP Location: Left Arm)   Pulse 76   Temp 98.4 F (36.9 C)   Resp 18   Ht 5\' 5"  (1.651 m)   Wt 64.9 kg   SpO2 99%   BMI 23.81 kg/m   Intake/Output from previous day: 10/16 0701 - 10/17 0700 In: 0  Out: 400 [Urine:400] Intake/Output this shift: No intake/output data recorded.   Physical Exam Vitals reviewed.  Constitutional:      Appearance: She is well-developed.  Abdominal:     Palpations: Abdomen is soft. There is no mass.     Tenderness: There is no abdominal tenderness.  Neurological:     Mental Status: She is alert.    Lab Results:  Results for orders placed or performed during the hospital encounter of 10/03/21 (from the past 24 hour(s))  Basic metabolic panel     Status: Abnormal   Collection Time: 10/06/21  4:24 AM  Result Value Ref Range   Sodium 133 (L) 135 - 145 mmol/L   Potassium 3.9 3.5 - 5.1 mmol/L   Chloride 94 (L) 98 - 111 mmol/L   CO2 30 22 - 32 mmol/L   Glucose, Bld 108 (H) 70 - 99 mg/dL   BUN 44 (H) 8 - 23 mg/dL   Creatinine, Ser 2.21 (H) 0.44 - 1.00 mg/dL    Calcium 9.5 8.9 - 10.3 mg/dL   GFR, Estimated 23 (L) >60 mL/min   Anion gap 9 5 - 15    BMET Recent Labs    10/05/21 0553 10/06/21 0424  NA 134* 133*  K 3.8 3.9  CL 96* 94*  CO2 28 30  GLUCOSE 102* 108*  BUN 38* 44*  CREATININE 2.01* 2.21*  CALCIUM 9.4 9.5   PT/INR No results for input(s): LABPROT, INR in the last 72 hours. ABG No results for input(s): PHART, HCO3 in the last 72 hours.  Invalid input(s): PCO2, PO2  Studies/Results: US RENAL  Result Date: 10/06/2021 CLINICAL DATA:  Acute kidney injury EXAM: RENAL / URINARY TRACT ULTRASOUND COMPLETE COMPARISON:  CT chest abdomen pelvis 07/24/2008 FINDINGS: Right Kidney: Renal measurements: 10.1 x 4.8 x 5.2 cm = volume: 131 mL. Echogenicity within normal limits. No mass or hydronephrosis visualized. Multiple simple cysts are present with the largest measuring 2.0 cm. Left Kidney: Renal measurements: 10.6 x 3.5 x 4.0 cm = volume: 79 mL. Echogenicity within normal limits. Mild hydronephrosis. 3.2 cm simple cyst is noted. Bladder: Appears normal for degree of bladder distention. Other: None. IMPRESSION: Mild left hydronephrosis suspicious for obstructive uropathy. Electronically Signed   By: Miachel Roux M.D.   On: 10/06/2021 12:28   CT RENAL STONE STUDY  Result Date: 10/06/2021 CLINICAL DATA:  Bladder neck construction, history of breast cancer. EXAM: CT ABDOMEN AND PELVIS WITHOUT CONTRAST TECHNIQUE: Multidetector CT imaging of the abdomen and pelvis was performed following the standard protocol without IV contrast. COMPARISON:  Renal ultrasound 10/06/2021. FINDINGS: Lower chest: Mild bronchiectasis and volume loss in the medial lower lobes. Heart is at the upper limits of normal in size to mildly enlarged. No pericardial or pleural effusion. Distal esophagus is grossly unremarkable. Hepatobiliary: Liver and gallbladder are unremarkable. No biliary ductal dilatation. Pancreas: Negative. Spleen: Negative. Adrenals/Urinary Tract:  Adrenal glands are unremarkable. Low-attenuation lesions in the kidneys measure up to 3 cm on the left and are likely cysts. No urinary stones. Ureters are decompressed. Bladder is grossly unremarkable. Stomach/Bowel: Tiny hiatal hernia. Stomach is decompressed. Stomach, small bowel and colon are otherwise unremarkable. Appendix is not readily visualized. Vascular/Lymphatic: Atherosclerotic calcification of the aorta. No pathologically enlarged lymph nodes. Reproductive: Uterus is visualized.  No adnexal mass. Other: No free fluid.  Mesenteries and peritoneum are unremarkable. Musculoskeletal: Degenerative changes in the spine. Levoconvex scoliosis. L2 vertebral body augmentation. IMPRESSION: 1. No acute findings.  No hydronephrosis. 2.  Aortic atherosclerosis (ICD10-I70.0). Electronically Signed   By: Lorin Picket M.D.   On: 10/06/2021 15:24     Assessment/Plan: Left hydronephrosis with renal insufficiency.   The CT shows no hydro or other GU findings.   No further evaluation needed.   Incomplete bladder emptying.   He has a moderately elevated PVR with a history of frequency and nocturia.  This is not contributing to her renal insufficiency and could be assessed further as an outpatient.     No follow-ups on file.    CC: Dr. Nolberto Hanlon     Irine Seal 10/06/2021 [phone]

## 2021-10-06 NOTE — Progress Notes (Signed)
PROGRESS NOTE    Kelsey Carter  VZD:[id number] DOB: 05-Mar-1946 DOA: 10/03/2021 PCP: Tracie Harrier, MD    Brief Narrative:  Kelsey Carter is a 75 y.o. female with medical history significant of breast cancer; CAD s/p stent; HTN; HLD; and afib on Eliquis presenting with SOB/palpitations.   10/16 reports her sob/DOE has improved. No new complaints. Cr up 2.0  10/17 creatinine up further at 2.21   Consultants:  Cardiology  Procedures:   Antimicrobials:      Subjective: Doe and sob improved. No cp  Objective: Vitals:   10/06/21 0403 10/06/21 0458 10/06/21 0724 10/06/21 1201  BP: 129/79  133/76 131/67  Pulse: 65  68 73  Resp: 16  18 18   Temp: 98 F (36.7 C)  98 F (36.7 C) 98 F (36.7 C)  TempSrc: Oral  Oral   SpO2: 96%  95% 95%  Weight:  64.9 kg    Height:        Intake/Output Summary (Last 24 hours) at 10/06/2021 1335 Last data filed at 10/06/2021 0725 Gross per 24 hour  Intake 0 ml  Output 900 ml  Net -900 ml   Filed Weights   10/04/21 1530 10/05/21 0331 10/06/21 0458  Weight: 64.3 kg 66.6 kg 64.9 kg    Examination: Nad, calm No jvd Cta no w/r/r Regular s1/s2 no gallop Soft benign +bs  No edema aaxoxo3   Data Reviewed: I have personally reviewed following labs and imaging studies  CBC: Recent Labs  Lab 10/03/21 0752 10/04/21 0603  WBC 5.2 5.7  HGB 11.6* 12.6  HCT 33.6* 36.8  MCV 100.9* 99.2  PLT 322 295   Basic Metabolic Panel: Recent Labs  Lab 10/03/21 0752 10/04/21 0603 10/05/21 0553 10/06/21 0424  NA 140 138 134* 133*  K 4.6 3.6 3.8 3.9  CL 103 98 96* 94*  CO2 27 29 28 30   GLUCOSE 128* 95 102* 108*  BUN 16 18 38* 44*  CREATININE 0.83 1.04* 2.01* 2.21*  CALCIUM 9.4 9.4 9.4 9.5   GFR: Estimated Creatinine Clearance: 19.5 mL/min (A) (by C-G formula based on SCr of 2.21 mg/dL (H)). Liver Function Tests: No results for input(s): AST, ALT, ALKPHOS, BILITOT, PROT, ALBUMIN in the last 168 hours. No results for  input(s): LIPASE, AMYLASE in the last 168 hours. No results for input(s): AMMONIA in the last 168 hours. Coagulation Profile: No results for input(s): INR, PROTIME in the last 168 hours. Cardiac Enzymes: No results for input(s): CKTOTAL, CKMB, CKMBINDEX, TROPONINI in the last 168 hours. BNP (last 3 results) No results for input(s): PROBNP in the last 8760 hours. HbA1C: No results for input(s): HGBA1C in the last 72 hours. CBG: No results for input(s): GLUCAP in the last 168 hours. Lipid Profile: No results for input(s): CHOL, HDL, LDLCALC, TRIG, CHOLHDL, LDLDIRECT in the last 72 hours. Thyroid Function Tests: No results for input(s): TSH, T4TOTAL, FREET4, T3FREE, THYROIDAB in the last 72 hours.  Anemia Panel: No results for input(s): VITAMINB12, FOLATE, FERRITIN, TIBC, IRON, RETICCTPCT in the last 72 hours. Sepsis Labs: No results for input(s): PROCALCITON, LATICACIDVEN in the last 168 hours.  Recent Results (from the past 240 hour(s))  Resp Panel by RT-PCR (Flu A&B, Covid) Nasopharyngeal Swab     Status: None   Collection Time: 10/03/21  8:39 AM   Specimen: Nasopharyngeal Swab; Nasopharyngeal(NP) swabs in vial transport medium  Result Value Ref Range Status   SARS Coronavirus 2 by RT PCR NEGATIVE NEGATIVE Final    Comment: (  NOTE) SARS-CoV-2 target nucleic acids are NOT DETECTED.  The SARS-CoV-2 RNA is generally detectable in upper respiratory specimens during the acute phase of infection. The lowest concentration of SARS-CoV-2 viral copies this assay can detect is 138 copies/mL. A negative result does not preclude SARS-Cov-2 infection and should not be used as the sole basis for treatment or other patient management decisions. A negative result may occur with  improper specimen collection/handling, submission of specimen other than nasopharyngeal swab, presence of viral mutation(s) within the areas targeted by this assay, and inadequate number of viral copies(<138 copies/mL).  A negative result must be combined with clinical observations, patient history, and epidemiological information. The expected result is Negative.  Fact Sheet for Patients:  EntrepreneurPulse.com.au  Fact Sheet for Healthcare Providers:  IncredibleEmployment.be  This test is no t yet approved or cleared by the [place] FDA and  has been authorized for detection and/or diagnosis of SARS-CoV-2 by FDA under an Emergency Use Authorization (EUA). This EUA will remain  in effect (meaning this test can be used) for the duration of the COVID-19 declaration under Section 564(b)(1) of the Act, 21 U.S.C.section 360bbb-3(b)(1), unless the authorization is terminated  or revoked sooner.       Influenza A by PCR NEGATIVE NEGATIVE Final   Influenza B by PCR NEGATIVE NEGATIVE Final    Comment: (NOTE) The Xpert Xpress SARS-CoV-2/FLU/RSV plus assay is intended as an aid in the diagnosis of influenza from Nasopharyngeal swab specimens and should not be used as a sole basis for treatment. Nasal washings and aspirates are unacceptable for Xpert Xpress SARS-CoV-2/FLU/RSV testing.  Fact Sheet for Patients: EntrepreneurPulse.com.au  Fact Sheet for Healthcare Providers: IncredibleEmployment.be  This test is not yet approved or cleared by the [place] FDA and has been authorized for detection and/or diagnosis of SARS-CoV-2 by FDA under an Emergency Use Authorization (EUA). This EUA will remain in effect (meaning this test can be used) for the duration of the COVID-19 declaration under Section 564(b)(1) of the Act, 21 U.S.C. section 360bbb-3(b)(1), unless the authorization is terminated or revoked.  Performed at [facility], [address] [place], [place] [address]          Radiology Studies: US RENAL  Result Date: 10/06/2021 CLINICAL DATA:  Acute kidney injury EXAM: RENAL / URINARY TRACT  ULTRASOUND COMPLETE COMPARISON:  CT chest abdomen pelvis 07/24/2008 FINDINGS: Right Kidney: Renal measurements: 10.1 x 4.8 x 5.2 cm = volume: 131 mL. Echogenicity within normal limits. No mass or hydronephrosis visualized. Multiple simple cysts are present with the largest measuring 2.0 cm. Left Kidney: Renal measurements: 10.6 x 3.5 x 4.0 cm = volume: 79 mL. Echogenicity within normal limits. Mild hydronephrosis. 3.2 cm simple cyst is noted. Bladder: Appears normal for degree of bladder distention. Other: None. IMPRESSION: Mild left hydronephrosis suspicious for obstructive uropathy. Electronically Signed   By: Miachel Roux M.D.   On: 10/06/2021 12:28        Scheduled Meds:  amiodarone  200 mg Oral BID   apixaban  5 mg Oral BID   aspirin EC  81 mg Oral Daily   atorvastatin  10 mg Oral Daily   docusate sodium  100 mg Oral BID   feeding supplement  237 mL Oral BID BM   levothyroxine  37.5 mcg Oral Q0600   metoprolol succinate  12.5 mg Oral BID   midodrine  5 mg Oral TID WC   multivitamin with minerals  1 tablet Oral Q1200   sodium chloride flush  3 mL Intravenous Q12H   Continuous Infusions:  sodium chloride 50 mL/hr at 10/06/21 1040    Assessment & Plan:   Principal Problem:   New onset of congestive heart failure (HCC) Active Problems:   Bilateral breast cancer (HCC)   Hyperlipidemia   Benign essential HTN   Coronary artery disease involving native coronary artery of native heart   PAF (paroxysmal atrial fibrillation) ([place])   New onset of acute systolic heart failure  Echo today with EF 45 to 50%.  No regional wall motion abnormalities.  Grade 1 diastolic dysfunction. 10/16cardiology following Hold ARB, spironolactone, Lasix due to low BP and AKI 10/17 clinically has improved.  Euvolemic on exam.   Will need to follow-up with cardiology as outpatient in 1 week To follow-up with heart failure clinic     PAF (paroxysmal atrial fibrillation) ([place]) -Rate controlled with  Amiodarone and metoprolol 10/17 rate controlled Beta blocker and Eliquis to be continued   Coronary artery disease involving native coronary artery of native heart Status post stent -10/17 on Eliquis or not on aspirin  Continue beta-blockers    AKI Due to diuresis. Creatinine still rising, today 2.21 At this point and will start gentle hydration with IV fluids at 50 MLS per hour Ordered renal US: found with mild left hydronephrosis suspicious for obstructive uropathy Urology consulted Will check bladder scan   benign essential HTN BP on low side.  Lasix DC'd.  Started on midodrine as she needs her beta-blockers.  Beta-blocker dose has been decreased.   Hyperlipidemia -Recent LDL was 93, goal <70 -Lipitor 10 mg daily was added   Bilateral breast cancer ([place]) -s/p B mastectomy -No longer taking anastrazole -Last f/u was in 08/2018, needs outpatient f/u        DVT prophylaxis: Eliquis Code Status: DNR Family Communication: None at bedside Disposition Plan:  Status is: Inpatient  Remains inpatient appropriate because:Inpatient level of care appropriate due to severity of illness   Dispo: The patient is from: Home              Anticipated d/c is to: Home              Patient currently is not medically stable to d/c.              Difficult to place patient No            LOS: 3 days   Time spent: 35 minutes with more than 50% on [place], MD [facility] Pager [phone]  If 7PM-7AM, please contact night-coverage 10/06/2021, 1:35 PM

## 2021-10-06 NOTE — Consult Note (Signed)
   Heart Failure Nurse Navigator Note  HFmrEF 45-50%.  Grade 1 diastolic dysfunction.  Normal right ventricular systolic function.  She presented to the emergency room with complaints of chest pain and increasing shortness of breath.  Also noted a cough.  Comorbidities:  Anxiety Anemia Breast cancer Coronary artery disease with stenting Hyperlipidemia Hypertension Paroxysmal atrial fibrillation Cardiomyopathy due to chemotherapy  Medications:  Amiodarone 200 mg 2 times a day Eliquis 5 mg 2 times a day Aspirin 81 mg daily Atorvastatin 10 mg daily Metoprolol succinate 12-1/2 mg 2 times a day  Furosemide is currently being held.  Labs:  Sodium 133, potassium 3.9, chloride 94, CO2 30, BUN 44, creatinine 2.2, yesterday's creatinine was 2.01, BNP on admission was 745  Weight is 64.9 kg down from 66.6 of yesterday Intake is not documented Output 400 mL    Initial meeting with patient today.  She states that the doctors had talked to her about her heart function, viewed that it is mildly decreased at 45 to 50%.  Discussed daily weights and recording, she states that she has a scale at home but would like to get a new one.  Discussed what weight increases to report to physician, she states that her daughter is a Marine scientist and has already gone over some of this with her.  Discussed low sodium.  Patient states that she does not add salt at the table and really does not use much pepper either.  She states that she likes to cook and they do not use convenience foods.  Discussed eating in restaurant, i.e. like Asian asked them not to add the sauces or the MSG and of eating at a steak house asked that they do not seasoned the steak with salt.  She voices understanding.  She states she mostly uses fresh or frozen vegetables/fruits, discussed if she uses canned vegetables to drain rinse and then cook in plain water.  Also discussed 64 ounce fluid restriction and the reasoning behind.  She  voices understanding.  Discussed if she is ill with nausea vomiting diarrhea outside in the heat and is perspiring a lot then she should increase her fluid intake.  Discussed signs and symptoms to report to physician.  Discussed follow-up in the outpatient heart failure clinic with [facility].  She has an appointment for October 24 at 1:30 in the afternoon.  She was giving living with heart failure teaching booklet, along with magnet and information on low-sodium.  Pricilla Riffle RN [facility]

## 2021-10-06 NOTE — Progress Notes (Signed)
[facility] Cardiology    SUBJECTIVE: The patient reports feeling better this morning. She denies chest pain or shortness of breath, and is eager to go home.   Vitals:   10/05/21 2311 10/06/21 0403 10/06/21 0458 10/06/21 0724  BP: 115/73 129/79  133/76  Pulse: 69 65  68  Resp: 16 16  18   Temp: 98 F (36.7 C) 98 F (36.7 C)  98 F (36.7 C)  TempSrc: Oral Oral  Oral  SpO2: 94% 96%  95%  Weight:   64.9 kg   Height:         Intake/Output Summary (Last 24 hours) at 10/06/2021 0817 Last data filed at 10/06/2021 0725 Gross per 24 hour  Intake 0 ml  Output 900 ml  Net -900 ml      PHYSICAL EXAM  General: Well developed, well nourished, in no acute distress, lying nearly flat HEENT:  Normocephalic and atramatic Neck:  No JVD.  Lungs: Clear bilaterally to auscultation, normal effort of breathing on RA Heart: HRRR . Normal S1 and S2 without gallops or murmurs.  Abdomen: nondistended Msk:  no obvious deformities Extremities: No clubbing, cyanosis or edema.   Neuro: Alert and oriented X 3. Psych:  Good affect, responds appropriately   LABS: Basic Metabolic Panel: Recent Labs    10/05/21 0553 10/06/21 0424  NA 134* 133*  K 3.8 3.9  CL 96* 94*  CO2 28 30  GLUCOSE 102* 108*  BUN 38* 44*  CREATININE 2.01* 2.21*  CALCIUM 9.4 9.5   Liver Function Tests: No results for input(s): AST, ALT, ALKPHOS, BILITOT, PROT, ALBUMIN in the last 72 hours. No results for input(s): LIPASE, AMYLASE in the last 72 hours. CBC: Recent Labs    10/04/21 0603  WBC 5.7  HGB 12.6  HCT 36.8  MCV 99.2  PLT 314   Cardiac Enzymes: No results for input(s): CKTOTAL, CKMB, CKMBINDEX, TROPONINI in the last 72 hours. BNP: Invalid input(s): POCBNP D-Dimer: No results for input(s): DDIMER in the last 72 hours. Hemoglobin A1C: No results for input(s): HGBA1C in the last 72 hours. Fasting Lipid Panel: No results for input(s): CHOL, HDL, LDLCALC, TRIG, CHOLHDL, LDLDIRECT in the last 72  hours. Thyroid Function Tests: No results for input(s): TSH, T4TOTAL, T3FREE, THYROIDAB in the last 72 hours.  Invalid input(s): FREET3 Anemia Panel: No results for input(s): VITAMINB12, FOLATE, FERRITIN, TIBC, IRON, RETICCTPCT in the last 72 hours.  ECHOCARDIOGRAM COMPLETE  Result Date: 10/04/2021    ECHOCARDIOGRAM REPORT   Patient Name:   Kelsey Carter Date of Exam: 10/04/2021 Medical Rec #:  [id number]          Height:       65.0 in Accession #:    [id number]         Weight:       148.0 lb Date of Birth:  [birth year]         BSA:          1.741 m Patient Age:    75 years           BP:           128/75 mmHg Patient Gender: F                  HR:           64 bpm. Exam Location:  [facility] Procedure: 2D Echo, Cardiac Doppler and Color Doppler Indications:     CHF-Acute Systolic 277.82 / U23.53  History:  Patient has no prior history of Echocardiogram examinations.                  CHF, Arrythmias:Atrial Fibrillation; Risk Factors:Hypertension.  Sonographer:     Alyse Low Roar Referring Phys:  [place] Diagnosing Phys: Isaias Cowman MD IMPRESSIONS  1. Left ventricular ejection fraction, by estimation, is 45 to 50%. The left ventricle has mildly decreased function. The left ventricle has no regional wall motion abnormalities. There is mild left ventricular hypertrophy. Left ventricular diastolic parameters are consistent with Grade I diastolic dysfunction (impaired relaxation).  2. Right ventricular systolic function is normal. The right ventricular size is normal.  3. The mitral valve is normal in structure. Mild mitral valve regurgitation. No evidence of mitral stenosis.  4. The aortic valve is normal in structure. Aortic valve regurgitation is not visualized. No aortic stenosis is present.  5. The inferior vena cava is normal in size with greater than 50% respiratory variability, suggesting right atrial pressure of 3 mmHg. FINDINGS  Left Ventricle: Left ventricular ejection  fraction, by estimation, is 45 to 50%. The left ventricle has mildly decreased function. The left ventricle has no regional wall motion abnormalities. The left ventricular internal cavity size was normal in size. There is mild left ventricular hypertrophy. Left ventricular diastolic parameters are consistent with Grade I diastolic dysfunction (impaired relaxation). Right Ventricle: The right ventricular size is normal. No increase in right ventricular wall thickness. Right ventricular systolic function is normal. Left Atrium: Left atrial size was normal in size. Right Atrium: Right atrial size was normal in size. Pericardium: There is no evidence of pericardial effusion. Mitral Valve: The mitral valve is normal in structure. Mild mitral valve regurgitation. No evidence of mitral valve stenosis. Tricuspid Valve: The tricuspid valve is normal in structure. Tricuspid valve regurgitation is mild . No evidence of tricuspid stenosis. Aortic Valve: The aortic valve is normal in structure. Aortic valve regurgitation is not visualized. No aortic stenosis is present. Aortic valve peak gradient measures 4.0 mmHg. Pulmonic Valve: The pulmonic valve was normal in structure. Pulmonic valve regurgitation is not visualized. No evidence of pulmonic stenosis. Aorta: The aortic root is normal in size and structure. Venous: The inferior vena cava is normal in size with greater than 50% respiratory variability, suggesting right atrial pressure of 3 mmHg. IAS/Shunts: No atrial level shunt detected by color flow Doppler.  LEFT VENTRICLE PLAX 2D LVIDd:         4.04 cm   Diastology LVIDs:         3.15 cm   LV e' medial:    3.15 cm/s LV PW:         1.03 cm   LV E/e' medial:  12.3 LV IVS:        1.41 cm   LV e' lateral:   5.87 cm/s LVOT diam:     1.90 cm   LV E/e' lateral: 6.6 LVOT Area:     2.84 cm  RIGHT VENTRICLE RV Mid diam:    2.48 cm RV S prime:     9.46 cm/s LEFT ATRIUM             Index        RIGHT ATRIUM           Index LA diam:         3.60 cm 2.07 cm/m   RA Area:     12.10 cm LA Vol (A2C):   44.4 ml 25.51 ml/m  RA Volume:  24.60 ml  14.13 ml/m LA Vol (A4C):   49.4 ml 28.38 ml/m LA Biplane Vol: 47.3 ml 27.18 ml/m  AORTIC VALVE                 PULMONIC VALVE AV Area (Vmax): 2.16 cm     PV Vmax:          0.86 m/s AV Vmax:        100.00 cm/s  PV Peak grad:     2.9 mmHg AV Peak Grad:   4.0 mmHg     PR End Diast Vel: 2.48 msec LVOT Vmax:      76.30 cm/s   RVOT Peak grad:   3 mmHg  AORTA Ao Root diam: 3.70 cm MITRAL VALVE               TRICUSPID VALVE MV Area (PHT): 4.10 cm    TR Peak grad:   17.6 mmHg MV Decel Time: 185 msec    TR Vmax:        210.00 cm/s MV E velocity: 38.60 cm/s MV A velocity: 66.40 cm/s  SHUNTS MV E/A ratio:  0.58        Systemic Diam: 1.90 cm MV A Prime:    8.2 cm/s Isaias Cowman MD Electronically signed by Isaias Cowman MD Signature Date/Time: 10/04/2021/12:30:51 PM    Final      Echo LVEF 45-50%  TELEMETRY: sinus rhythm, LBBB, 77 bpm  ASSESSMENT AND PLAN:  Principal Problem:   New onset of congestive heart failure (HCC) Active Problems:   Bilateral breast cancer (HCC)   Hyperlipidemia   Benign essential HTN   Coronary artery disease involving native coronary artery of native heart   PAF (paroxysmal atrial fibrillation) ([place])    1. Acute diastolic CHF, patient clinically improving and appears euvolemic. Lasix has been discontinued due to AKI. 2. Paroxsymal atrial fibrillation, on amiodarone and Eliquis, currently in sinus rhythm. 3. Coronary artery disease, status post DES proximal LAD on 08/02/2019 on statin; not on aspirin. Recent Lexiscan Myoview in 07/2021 negative for ischemia. 4. Hypertension, blood pressure in normal range at this time, currently on metoprolol succinate and midodrine. Spironolactone and irbesartan held. 5. Acute kidney injury, creatinine 2.21, BUN 44  Plan: Recommend discontinuing midodrine in the setting of AKI Continue amiodarone for rhythm control, and  metoprolol for rate control Continue Eliquis for stroke prevention Continue statin and low dose aspirin Refer to heart failure clinic Hold diuretic at this time in light of AKI; can be resumed as outpatient accordingly per cardiologist Follow up with Dr. Nehemiah Massed in 1 week (appointment request has been placed)  Clabe Seal, PA-C 10/06/2021 8:17 AM

## 2021-10-07 DIAGNOSIS — I509 Heart failure, unspecified: Secondary | ICD-10-CM | POA: Diagnosis not present

## 2021-10-07 LAB — BASIC METABOLIC PANEL
Anion gap: 9 (ref 5–15)
BUN: 32 mg/dL — ABNORMAL HIGH (ref 8–23)
CO2: 28 mmol/L (ref 22–32)
Calcium: 9.5 mg/dL (ref 8.9–10.3)
Chloride: 100 mmol/L (ref 98–111)
Creatinine, Ser: 1.34 mg/dL — ABNORMAL HIGH (ref 0.44–1.00)
GFR, Estimated: 41 mL/min — ABNORMAL LOW (ref >60)
Glucose, Bld: 107 mg/dL — ABNORMAL HIGH (ref 70–99)
Potassium: 4.2 mmol/L (ref 3.5–5.1)
Sodium: 137 mmol/L (ref 135–145)

## 2021-10-07 MED ORDER — ATORVASTATIN CALCIUM 10 MG PO TABS: 10.0000 mg | ORAL_TABLET | Freq: Every day | ORAL | 0 refills | Status: AC

## 2021-10-07 MED ORDER — METOPROLOL SUCCINATE ER 25 MG PO TB24: 12.5000 mg | ORAL_TABLET | Freq: Two times a day (BID) | ORAL | 0 refills | Status: AC

## 2021-10-07 NOTE — Discharge Summary (Signed)
Kelsey Carter:[id number] DOB: May 10, 1946 DOA: 10/03/2021  PCP: Tracie Harrier, MD  Admit date: 10/03/2021 Discharge date: 10/07/2021  Admitted From: home Disposition:  home  Recommendations for Outpatient Follow-up:  Follow up with PCP in 1 week Please obtain BMP/CBC in one week Please follow up urology in 2 weeks F/u with cardiology in one week      Discharge Condition:Stable CODE STATUS: Full Diet recommendation: Heart Healthy  Brief/Interim Summary: Per ASN:Kelsey Carter is a 75 y.o. female with medical history significant of breast cancer; CAD s/p stent; HTN; HLD; and afib on Eliquis presenting with SOB/CP. She reports that she had a cough last night, it woke her up.  She awoke this AM with palpitations and chest pain.  She got up and took her BP and her machine said "I'm in crisis."  She woke up her husband and came to the ER.  In the ER patient got very dyspneic and dropped O2 sats to 82%.  Not on home O2.  BNP 745.  BP improved with NTG, given 40 mg IV Lasix.  On 2L [place] O2. Patient was admitted for CHF management.  Then she developed acute renal injury due to diuresis.  This resolved with gentle hydration and increasing her p.o. intake.  A renal ultrasound was done revealing left hydronephrosis while her creatinine was elevated.Urology was consulted, CT renal protocal was ordered with results below.  The CT shows no hydronephrosis.  Urology did not feel patient needed any further evaluation.  They felt patient had incomplete bladder emptying.   Patient has moderately elevated PVR with a history of frequency and nocturia.  This is not contributing to her renal insufficiency and could be assessed further as an outpatient.    New onset of acute systolic heart failure  Echo today with EF 45 to 50%.  No regional wall motion abnormalities.  Grade 1 diastolic dysfunction. Cardiology consulted Needs to follow-up with cardiology as outpatient 1 week  Also follow-up with CHF  clinic        PAF (paroxysmal atrial fibrillation) ([place]) -Rate controlled with Amiodarone and metoprolol Continue Eliquis   Coronary artery disease involving native coronary artery of native heart Status post stent on Eliquis or not on aspirin (patient reports her hematologist stopped aspirin, patient was advised to discuss aspirin with cardiology and her hematologist.) Continue beta-blockers      AKI Due to diuresis. Improved with IV fluids  renal US: found with mild left hydronephrosis suspicious for obstructive uropathy.  Urology was consulted The CT shows no hydronephrosis.  Urology did not feel patient needed any further evaluation.  They felt patient had incomplete bladder emptying.   Patient has moderately elevated PVR with a history of frequency and nocturia.  This is not contributing to her renal insufficiency and could be assessed further as an outpatient.       Benign essential HTN Continue beta-blockers.  Valsartan discontinued needs to follow-up with cardiology check BMP and see if she needs to resume at that point.    Hyperlipidemia -Recent LDL was 93, goal <70 -Continue statin    Bilateral breast cancer ([place]) -s/p B mastectomy -No longer taking anastrazole -Last f/u was in 08/2018, needs outpatient f/u     Discharge Diagnoses:  Principal Problem:   New onset of congestive heart failure ([place]) Active Problems:   Bilateral breast cancer ([place])   Hyperlipidemia   Benign essential HTN   Coronary artery disease involving native coronary artery of native heart   PAF (  paroxysmal atrial fibrillation) [facility])    Discharge Instructions  Discharge Instructions     AMB referral to CHF clinic   Complete by: As directed    Diet - low sodium heart healthy   Complete by: As directed    Increase activity slowly   Complete by: As directed       Allergies as of 10/07/2021       Reactions   Hydrocodone-acetaminophen Nausea And Vomiting   Severe vomitting    Morphine And Related Nausea And Vomiting   Pt preference    Ciprofloxacin Other (See Comments)   Joint pain   Gabapentin Other (See Comments)   Eyes blurry; vision decreased, dizziness         Medication List     STOP taking these medications    valsartan 160 MG tablet Commonly known as: DIOVAN       TAKE these medications    alendronate 70 MG tablet Commonly known as: FOSAMAX Take 70 mg by mouth once a week.   amiodarone 200 MG tablet Commonly known as: PACERONE Take 200 mg by mouth 2 (two) times daily.   atorvastatin 10 MG tablet Commonly known as: LIPITOR Take 1 tablet (10 mg total) by mouth daily. Start taking on: October 08, 2021   Eliquis 5 MG Tabs tablet Generic drug: apixaban Take 5 mg by mouth 2 (two) times daily.   levothyroxine 25 MCG tablet Commonly known as: SYNTHROID Take 25 mcg by mouth in the morning.   lidocaine 5 % Commonly known as: Lidoderm Place 1 patch onto the skin daily. Remove & Discard patch within 12 hours or as directed by MD   MAGNESIUM PO Take 1 tablet by mouth in the morning.   metoprolol succinate 25 MG 24 hr tablet Commonly known as: TOPROL-XL Take 0.5 tablets (12.5 mg total) by mouth 2 (two) times daily. What changed:  medication strength how much to take when to take this   VITAMIN D3 PO Take 1 tablet by mouth in the morning.        Follow-up Information     Corey Skains, MD. Go on 10/23/2021.   Specialty: Cardiology Why: @ 10:45am Contact information: [address] [facility] [place] [address] [phone]         Hollice Espy, MD Follow up on 10/16/2021.   Specialty: Urology Why: @ 9:45am Contact information: [address] [place] [address] [phone]         Tracie Harrier, MD Follow up on 10/20/2021.   Specialty: Internal Medicine Why: @ 2:45pm Contact information: [place] [address] [phone]                Allergies  Allergen Reactions   Hydrocodone-Acetaminophen Nausea And Vomiting    Severe vomitting   Morphine And Related Nausea And Vomiting    Pt preference    Ciprofloxacin Other (See Comments)    Joint pain    Gabapentin Other (See Comments)    Eyes blurry; vision decreased, dizziness     Consultations: Cardiology, urology   Procedures/Studies: CT HEAD WO CONTRAST (5MM)  Result Date: 09/09/2021 CLINICAL DATA:  [place] and hit head today. EXAM: CT HEAD WITHOUT CONTRAST TECHNIQUE: Contiguous axial images were obtained from the base of the skull through the vertex without intravenous contrast. COMPARISON:  None. FINDINGS: Brain: Age related cerebral atrophy, ventriculomegaly and periventricular white matter disease. No extra-axial fluid collections are identified. No  CT findings for acute hemispheric infarction or intracranial hemorrhage. No mass lesions. The brainstem and cerebellum are normal. Vascular: Moderate vascular calcifications but no aneurysm or hyperdense vessels. Skull: No acute fracture. No bone lesions. Hyperostosis frontalis interna. Sinuses/Orbits: The paranasal sinuses and mastoid air cells are clear. The globes are intact. Other: No scalp lesions or hematoma. IMPRESSION: 1. Age related cerebral atrophy, ventriculomegaly and periventricular white matter disease. 2. No acute intracranial findings or skull fracture. Electronically Signed   By: Marijo Sanes M.D.   On: 09/09/2021 17:34   US RENAL  Result Date: 10/06/2021 CLINICAL DATA:  Acute kidney injury EXAM: RENAL / URINARY TRACT ULTRASOUND COMPLETE COMPARISON:  CT chest abdomen pelvis 07/24/2008 FINDINGS: Right Kidney: Renal measurements: 10.1 x 4.8 x 5.2 cm = volume: 131 mL. Echogenicity within normal limits. No mass or hydronephrosis visualized. Multiple simple cysts are present with the largest measuring 2.0 cm. Left Kidney: Renal measurements: 10.6 x 3.5 x  4.0 cm = volume: 79 mL. Echogenicity within normal limits. Mild hydronephrosis. 3.2 cm simple cyst is noted. Bladder: Appears normal for degree of bladder distention. Other: None. IMPRESSION: Mild left hydronephrosis suspicious for obstructive uropathy. Electronically Signed   By: Miachel Roux M.D.   On: 10/06/2021 12:28   DG Chest Portable 1 View  Result Date: 10/03/2021 CLINICAL DATA:  Chest pain EXAM: PORTABLE CHEST 1 VIEW COMPARISON:  07/21/2019 FINDINGS: Mild cardiac enlargement. Pulmonary vascular congestion. Mild interstitial edema bilaterally. No pleural effusion. IMPRESSION: Congestive heart failure with mild edema. Electronically Signed   By: Franchot Gallo M.D.   On: 10/03/2021 07:56   ECHOCARDIOGRAM COMPLETE  Result Date: 10/04/2021    ECHOCARDIOGRAM REPORT   Patient Name:   Kelsey Carter Date of Exam: 10/04/2021 Medical Rec #:  [id number]          Height:       65.0 in Accession #:    [id number]         Weight:       148.0 lb Date of Birth:  31-Oct-1946         BSA:          1.741 m Patient Age:    75 years           BP:           128/75 mmHg Patient Gender: F                  HR:           64 bpm. Exam Location:  [facility] Procedure: 2D Echo, Cardiac Doppler and Color Doppler Indications:     CHF-Acute Systolic 242.35 / T61.44  History:         Patient has no prior history of Echocardiogram examinations.                  CHF, Arrythmias:Atrial Fibrillation; Risk Factors:Hypertension.  Sonographer:     Alyse Low Roar Referring Phys:  Porter Diagnosing Phys: Isaias Cowman MD IMPRESSIONS  1. Left ventricular ejection fraction, by estimation, is 45 to 50%. The left ventricle has mildly decreased function. The left ventricle has no regional wall motion abnormalities. There is mild left ventricular hypertrophy. Left ventricular diastolic parameters are consistent with Grade I diastolic dysfunction (impaired relaxation).  2. Right ventricular systolic function is normal. The right  ventricular size is normal.  3. The mitral valve is normal in structure. Mild mitral valve regurgitation. No evidence of mitral stenosis.  4. The aortic valve  is normal in structure. Aortic valve regurgitation is not visualized. No aortic stenosis is present.  5. The inferior vena cava is normal in size with greater than 50% respiratory variability, suggesting right atrial pressure of 3 mmHg. FINDINGS  Left Ventricle: Left ventricular ejection fraction, by estimation, is 45 to 50%. The left ventricle has mildly decreased function. The left ventricle has no regional wall motion abnormalities. The left ventricular internal cavity size was normal in size. There is mild left ventricular hypertrophy. Left ventricular diastolic parameters are consistent with Grade I diastolic dysfunction (impaired relaxation). Right Ventricle: The right ventricular size is normal. No increase in right ventricular wall thickness. Right ventricular systolic function is normal. Left Atrium: Left atrial size was normal in size. Right Atrium: Right atrial size was normal in size. Pericardium: There is no evidence of pericardial effusion. Mitral Valve: The mitral valve is normal in structure. Mild mitral valve regurgitation. No evidence of mitral valve stenosis. Tricuspid Valve: The tricuspid valve is normal in structure. Tricuspid valve regurgitation is mild . No evidence of tricuspid stenosis. Aortic Valve: The aortic valve is normal in structure. Aortic valve regurgitation is not visualized. No aortic stenosis is present. Aortic valve peak gradient measures 4.0 mmHg. Pulmonic Valve: The pulmonic valve was normal in structure. Pulmonic valve regurgitation is not visualized. No evidence of pulmonic stenosis. Aorta: The aortic root is normal in size and structure. Venous: The inferior vena cava is normal in size with greater than 50% respiratory variability, suggesting right atrial pressure of 3 mmHg. IAS/Shunts: No atrial level shunt detected  by color flow Doppler.  LEFT VENTRICLE PLAX 2D LVIDd:         4.04 cm   Diastology LVIDs:         3.15 cm   LV e' medial:    3.15 cm/s LV PW:         1.03 cm   LV E/e' medial:  12.3 LV IVS:        1.41 cm   LV e' lateral:   5.87 cm/s LVOT diam:     1.90 cm   LV E/e' lateral: 6.6 LVOT Area:     2.84 cm  RIGHT VENTRICLE RV Mid diam:    2.48 cm RV S prime:     9.46 cm/s LEFT ATRIUM             Index        RIGHT ATRIUM           Index LA diam:        3.60 cm 2.07 cm/m   RA Area:     12.10 cm LA Vol (A2C):   44.4 ml 25.51 ml/m  RA Volume:   24.60 ml  14.13 ml/m LA Vol (A4C):   49.4 ml 28.38 ml/m LA Biplane Vol: 47.3 ml 27.18 ml/m  AORTIC VALVE                 PULMONIC VALVE AV Area (Vmax): 2.16 cm     PV Vmax:          0.86 m/s AV Vmax:        100.00 cm/s  PV Peak grad:     2.9 mmHg AV Peak Grad:   4.0 mmHg     PR End Diast Vel: 2.48 msec LVOT Vmax:      76.30 cm/s   RVOT Peak grad:   3 mmHg  AORTA Ao Root diam: 3.70 cm MITRAL VALVE  TRICUSPID VALVE MV Area (PHT): 4.10 cm    TR Peak grad:   17.6 mmHg MV Decel Time: 185 msec    TR Vmax:        210.00 cm/s MV E velocity: 38.60 cm/s MV A velocity: 66.40 cm/s  SHUNTS MV E/A ratio:  0.58        Systemic Diam: 1.90 cm MV A Prime:    8.2 cm/s Isaias Cowman MD Electronically signed by Isaias Cowman MD Signature Date/Time: 10/04/2021/12:30:51 PM    Final    CT RENAL STONE STUDY  Result Date: 10/06/2021 CLINICAL DATA:  Bladder neck construction, history of breast cancer. EXAM: CT ABDOMEN AND PELVIS WITHOUT CONTRAST TECHNIQUE: Multidetector CT imaging of the abdomen and pelvis was performed following the standard protocol without IV contrast. COMPARISON:  Renal ultrasound 10/06/2021. FINDINGS: Lower chest: Mild bronchiectasis and volume loss in the medial lower lobes. Heart is at the upper limits of normal in size to mildly enlarged. No pericardial or pleural effusion. Distal esophagus is grossly unremarkable. Hepatobiliary: Liver and  gallbladder are unremarkable. No biliary ductal dilatation. Pancreas: Negative. Spleen: Negative. Adrenals/Urinary Tract: Adrenal glands are unremarkable. Low-attenuation lesions in the kidneys measure up to 3 cm on the left and are likely cysts. No urinary stones. Ureters are decompressed. Bladder is grossly unremarkable. Stomach/Bowel: Tiny hiatal hernia. Stomach is decompressed. Stomach, small bowel and colon are otherwise unremarkable. Appendix is not readily visualized. Vascular/Lymphatic: Atherosclerotic calcification of the aorta. No pathologically enlarged lymph nodes. Reproductive: Uterus is visualized.  No adnexal mass. Other: No free fluid.  Mesenteries and peritoneum are unremarkable. Musculoskeletal: Degenerative changes in the spine. Levoconvex scoliosis. L2 vertebral body augmentation. IMPRESSION: 1. No acute findings.  No hydronephrosis. 2.  Aortic atherosclerosis (ICD10-I70.0). Electronically Signed   By: Lorin Picket M.D.   On: 10/06/2021 15:24      Subjective: Feels well wants to go home no shortness of breath no dyspnea on exertion  Discharge Exam: Vitals:   10/07/21 0424 10/07/21 0712  BP: 125/74 130/75  Pulse: 72 71  Resp: 17 20  Temp:  (!) 97.4 F (36.3 C)  SpO2: 97% 97%   Vitals:   10/06/21 1954 10/06/21 2332 10/07/21 0424 10/07/21 0712  BP: (!) 153/83 (!) 143/77 125/74 130/75  Pulse: 76 73 72 71  Resp: 18 18 17 20   Temp: 98.4 F (36.9 C) 98.4 F (36.9 C)  (!) 97.4 F (36.3 C)  TempSrc:  Oral    SpO2: 99% 98% 97% 97%  Weight:      Height:        General: Pt is alert, awake, not in acute distress Cardiovascular: RRR, S1/S2 +, no rubs, no gallops Respiratory: CTA bilaterally, no wheezing, no rhonchi Abdominal: Soft, NT, ND, bowel sounds + Extremities: no edema, no cyanosis    The results of significant diagnostics from this hospitalization (including imaging, microbiology, ancillary and laboratory) are listed below for reference.      Microbiology: Recent Results (from the past 240 hour(s))  Resp Panel by RT-PCR (Flu A&B, Covid) Nasopharyngeal Swab     Status: None   Collection Time: 10/03/21  8:39 AM   Specimen: Nasopharyngeal Swab; Nasopharyngeal(NP) swabs in vial transport medium  Result Value Ref Range Status   SARS Coronavirus 2 by RT PCR NEGATIVE NEGATIVE Final    Comment: (NOTE) SARS-CoV-2 target nucleic acids are NOT DETECTED.  The SARS-CoV-2 RNA is generally detectable in upper respiratory specimens during the acute phase of infection. The lowest concentration of SARS-CoV-2 viral  copies this assay can detect is 138 copies/mL. A negative result does not preclude SARS-Cov-2 infection and should not be used as the sole basis for treatment or other patient management decisions. A negative result may occur with  improper specimen collection/handling, submission of specimen other than nasopharyngeal swab, presence of viral mutation(s) within the areas targeted by this assay, and inadequate number of viral copies(<138 copies/mL). A negative result must be combined with clinical observations, patient history, and epidemiological information. The expected result is Negative.  Fact Sheet for Patients:  EntrepreneurPulse.com.au  Fact Sheet for Healthcare Providers:  IncredibleEmployment.be  This test is no t yet approved or cleared by the [place] FDA and  has been authorized for detection and/or diagnosis of SARS-CoV-2 by FDA under an Emergency Use Authorization (EUA). This EUA will remain  in effect (meaning this test can be used) for the duration of the COVID-19 declaration under Section 564(b)(1) of the Act, 21 U.S.C.section 360bbb-3(b)(1), unless the authorization is terminated  or revoked sooner.       Influenza A by PCR NEGATIVE NEGATIVE Final   Influenza B by PCR NEGATIVE NEGATIVE Final    Comment: (NOTE) The Xpert Xpress SARS-CoV-2/FLU/RSV plus assay is  intended as an aid in the diagnosis of influenza from Nasopharyngeal swab specimens and should not be used as a sole basis for treatment. Nasal washings and aspirates are unacceptable for Xpert Xpress SARS-CoV-2/FLU/RSV testing.  Fact Sheet for Patients: EntrepreneurPulse.com.au  Fact Sheet for Healthcare Providers: IncredibleEmployment.be  This test is not yet approved or cleared by the [place] FDA and has been authorized for detection and/or diagnosis of SARS-CoV-2 by FDA under an Emergency Use Authorization (EUA). This EUA will remain in effect (meaning this test can be used) for the duration of the COVID-19 declaration under Section 564(b)(1) of the Act, 21 U.S.C. section 360bbb-3(b)(1), unless the authorization is terminated or revoked.  Performed at [facility], [address] [place], [place] [address]      Labs: BNP (last 3 results) Recent Labs    10/03/21 0752  BNP 096.2*   Basic Metabolic Panel: Recent Labs  Lab 10/03/21 0752 10/04/21 0603 10/05/21 0553 10/06/21 0424 10/07/21 0927  NA 140 138 134* 133* 137  K 4.6 3.6 3.8 3.9 4.2  CL 103 98 96* 94* 100  CO2 27 29 28 30 28   GLUCOSE 128* 95 102* 108* 107*  BUN 16 18 38* 44* 32*  CREATININE 0.83 1.04* 2.01* 2.21* 1.34*  CALCIUM 9.4 9.4 9.4 9.5 9.5   Liver Function Tests: No results for input(s): AST, ALT, ALKPHOS, BILITOT, PROT, ALBUMIN in the last 168 hours. No results for input(s): LIPASE, AMYLASE in the last 168 hours. No results for input(s): AMMONIA in the last 168 hours. CBC: Recent Labs  Lab 10/03/21 0752 10/04/21 0603  WBC 5.2 5.7  HGB 11.6* 12.6  HCT 33.6* 36.8  MCV 100.9* 99.2  PLT 322 314   Cardiac Enzymes: No results for input(s): CKTOTAL, CKMB, CKMBINDEX, TROPONINI in the last 168 hours. BNP: Invalid input(s): POCBNP CBG: No results for input(s): GLUCAP in the last 168 hours. D-Dimer No results for input(s): DDIMER in the  last 72 hours. Hgb A1c No results for input(s): HGBA1C in the last 72 hours. Lipid Profile No results for input(s): CHOL, HDL, LDLCALC, TRIG, CHOLHDL, LDLDIRECT in the last 72 hours. Thyroid function studies No results for input(s): TSH, T4TOTAL, T3FREE, THYROIDAB in the last 72 hours.  Invalid input(s): FREET3 Anemia work up No results  for input(s): VITAMINB12, FOLATE, FERRITIN, TIBC, IRON, RETICCTPCT in the last 72 hours. Urinalysis    Component Value Date/Time   COLORURINE Straw 10/04/2013 1033   APPEARANCEUR Clear 10/04/2013 1033   LABSPEC 1.005 10/04/2013 1033   PHURINE 7.0 10/04/2013 1033   GLUCOSEU Negative 10/04/2013 1033   HGBUR Negative 10/04/2013 1033   BILIRUBINUR Negative 10/04/2013 1033   KETONESUR Negative 10/04/2013 1033   PROTEINUR Negative 10/04/2013 1033   NITRITE Negative 10/04/2013 1033   LEUKOCYTESUR Trace 10/04/2013 1033   Sepsis Labs Invalid input(s): PROCALCITONIN,  WBC,  LACTICIDVEN Microbiology Recent Results (from the past 240 hour(s))  Resp Panel by RT-PCR (Flu A&B, Covid) Nasopharyngeal Swab     Status: None   Collection Time: 10/03/21  8:39 AM   Specimen: Nasopharyngeal Swab; Nasopharyngeal(NP) swabs in vial transport medium  Result Value Ref Range Status   SARS Coronavirus 2 by RT PCR NEGATIVE NEGATIVE Final    Comment: (NOTE) SARS-CoV-2 target nucleic acids are NOT DETECTED.  The SARS-CoV-2 RNA is generally detectable in upper respiratory specimens during the acute phase of infection. The lowest concentration of SARS-CoV-2 viral copies this assay can detect is 138 copies/mL. A negative result does not preclude SARS-Cov-2 infection and should not be used as the sole basis for treatment or other patient management decisions. A negative result may occur with  improper specimen collection/handling, submission of specimen other than nasopharyngeal swab, presence of viral mutation(s) within the areas targeted by this assay, and inadequate  number of viral copies(<138 copies/mL). A negative result must be combined with clinical observations, patient history, and epidemiological information. The expected result is Negative.  Fact Sheet for Patients:  EntrepreneurPulse.com.au  Fact Sheet for Healthcare Providers:  IncredibleEmployment.be  This test is no t yet approved or cleared by the [place] FDA and  has been authorized for detection and/or diagnosis of SARS-CoV-2 by FDA under an Emergency Use Authorization (EUA). This EUA will remain  in effect (meaning this test can be used) for the duration of the COVID-19 declaration under Section 564(b)(1) of the Act, 21 U.S.C.section 360bbb-3(b)(1), unless the authorization is terminated  or revoked sooner.       Influenza A by PCR NEGATIVE NEGATIVE Final   Influenza B by PCR NEGATIVE NEGATIVE Final    Comment: (NOTE) The Xpert Xpress SARS-CoV-2/FLU/RSV plus assay is intended as an aid in the diagnosis of influenza from Nasopharyngeal swab specimens and should not be used as a sole basis for treatment. Nasal washings and aspirates are unacceptable for Xpert Xpress SARS-CoV-2/FLU/RSV testing.  Fact Sheet for Patients: EntrepreneurPulse.com.au  Fact Sheet for Healthcare Providers: IncredibleEmployment.be  This test is not yet approved or cleared by the [place] FDA and has been authorized for detection and/or diagnosis of SARS-CoV-2 by FDA under an Emergency Use Authorization (EUA). This EUA will remain in effect (meaning this test can be used) for the duration of the COVID-19 declaration under Section 564(b)(1) of the Act, 21 U.S.C. section 360bbb-3(b)(1), unless the authorization is terminated or revoked.  Performed at [facility], [address] [place], [place] [address]      Time coordinating discharge: Over 30 minutes  SIGNED:   Nolberto Hanlon, MD  [facility] 10/07/2021, 5:05 PM Pager   If 7PM-7AM, please contact night-coverage [email or web address] Password [email or web address]

## 2021-10-07 NOTE — Plan of Care (Signed)

## 2021-10-07 NOTE — Progress Notes (Signed)
PT Cancellation Note  Patient Details Name: Kelsey Carter MRN: [id number] DOB: 1946-06-30   Cancelled Treatment:    Reason Eval/Treat Not Completed: Patient declined, no reason specified. Upon arrival to room pt very disgruntled asking why PT in room. PT educated pt on role of PT in acute setting. Pt denying need for acute PT as she is ambulatory in her room. PT educated pt on benefits of rehab during acute stay with pt further declining need for it at this time. PT will re-attempt at later time and date as available.   Salem Caster. Fairly IV, PT, DPT Physical Therapist- [facility]  10/07/2021, 9:35 AM

## 2021-10-12 NOTE — Progress Notes (Deleted)
   Patient ID: Kelsey Carter, female    DOB: [birth year], 75 y.o.   MRN: [id number]  HPI  Kelsey Carter is a 75 y/o female with a history of  Echo report from 10/04/21 reviewed and showed an EF of 45-50% along with mild LVH and mild MR.   LHC on 07/27/2019 showed: Prox RCA lesion is 40% stenosed. Ost LAD to Prox LAD lesion is 75% stenosed. 1st Mrg lesion is 30% stenosed.  Angiography done 08/02/2019: 1st Mrg lesion is 30% stenosed. Ost LAD lesion is 40% stenosed. Mid LAD lesion is 50% stenosed. Prox LAD lesion is 75% stenosed. Post intervention, there is a 0% residual stenosis. A drug-eluting stent was successfully placed using a STENT RESOLUTE ONYX 2.5X12.  Admitted 10/03/21 due to palpitations and chest pain. Hypoxic initially and was placed on oxygen. Initially given IV lasix with resultant AKI. Gentle hydration provided. Renal ultrasound done. Renal CT negative for hydronephrosis. Cardiology and urology consults obtained. Discharged after 4 days.   She presents today for her initial visit with a chief complaint of   Review of Systems    Physical Exam  Assessment & Plan:  1: Chronic heart failure with mildly reduced ejection fraction- - NYHA class - BNP 10/03/21 was 745.3  2: HTN- - BP - saw PCP 09/16/21; returns 10/20/21 - BMP 10/07/21 reviewed and showed sodium 137, potassium 4.2, creatinine 1.34 and GFR 41  3: CAD- - stent placed in LAD in 2020  4: Paroxysmal atrial fibrillation- - saw cardiology Nehemiah Massed) 09/12/21; returns 10/23/21

## 2021-10-13 ENCOUNTER — Other Ambulatory Visit: Payer: Self-pay

## 2021-10-13 ENCOUNTER — Encounter: Payer: Self-pay | Admitting: Family

## 2021-10-13 ENCOUNTER — Ambulatory Visit: Payer: PPO | Attending: Family | Admitting: Family

## 2021-10-13 VITALS — BP 134/82 | HR 72 | Resp 16 | Ht 65.0 in | Wt 146.0 lb

## 2021-10-13 DIAGNOSIS — Z881 Allergy status to other antibiotic agents status: Secondary | ICD-10-CM | POA: Insufficient documentation

## 2021-10-13 DIAGNOSIS — I5022 Chronic systolic (congestive) heart failure: Secondary | ICD-10-CM | POA: Insufficient documentation

## 2021-10-13 DIAGNOSIS — Z79899 Other long term (current) drug therapy: Secondary | ICD-10-CM | POA: Diagnosis not present

## 2021-10-13 DIAGNOSIS — I2510 Atherosclerotic heart disease of native coronary artery without angina pectoris: Secondary | ICD-10-CM | POA: Diagnosis not present

## 2021-10-13 DIAGNOSIS — Z87891 Personal history of nicotine dependence: Secondary | ICD-10-CM | POA: Diagnosis not present

## 2021-10-13 DIAGNOSIS — Z79890 Hormone replacement therapy (postmenopausal): Secondary | ICD-10-CM | POA: Diagnosis not present

## 2021-10-13 DIAGNOSIS — Z853 Personal history of malignant neoplasm of breast: Secondary | ICD-10-CM | POA: Diagnosis not present

## 2021-10-13 DIAGNOSIS — Z8249 Family history of ischemic heart disease and other diseases of the circulatory system: Secondary | ICD-10-CM | POA: Insufficient documentation

## 2021-10-13 DIAGNOSIS — Z9013 Acquired absence of bilateral breasts and nipples: Secondary | ICD-10-CM | POA: Insufficient documentation

## 2021-10-13 DIAGNOSIS — Z7901 Long term (current) use of anticoagulants: Secondary | ICD-10-CM | POA: Insufficient documentation

## 2021-10-13 DIAGNOSIS — E785 Hyperlipidemia, unspecified: Secondary | ICD-10-CM | POA: Insufficient documentation

## 2021-10-13 DIAGNOSIS — I480 Paroxysmal atrial fibrillation: Secondary | ICD-10-CM | POA: Insufficient documentation

## 2021-10-13 DIAGNOSIS — I110 Hypertensive heart disease with heart failure: Secondary | ICD-10-CM | POA: Insufficient documentation

## 2021-10-13 DIAGNOSIS — Z888 Allergy status to other drugs, medicaments and biological substances status: Secondary | ICD-10-CM | POA: Diagnosis not present

## 2021-10-13 DIAGNOSIS — Z955 Presence of coronary angioplasty implant and graft: Secondary | ICD-10-CM | POA: Diagnosis not present

## 2021-10-13 DIAGNOSIS — I10 Essential (primary) hypertension: Secondary | ICD-10-CM | POA: Diagnosis not present

## 2021-10-13 DIAGNOSIS — Z885 Allergy status to narcotic agent status: Secondary | ICD-10-CM | POA: Diagnosis not present

## 2021-10-13 NOTE — Progress Notes (Signed)
Patient ID: Kelsey Carter, female    DOB: February 19, 1946, 75 y.o.   MRN: [id number]   HPI   Kelsey Carter is a 75 y/o female with a history of  breast cancer; CAD s/p stent; HTN; HLD; and afib on Eliquis.   She was admitted to the hospital from 10/14-18/18/22.  During her stay, she developed AKI secondary to diuresis. Renal ultrasound done. Renal CT negative for hydronephrosis. Cardiology and urology consults obtained.This resolved with hydration and she was discharged back home.    Echo report from 10/04/21 reviewed and showed an EF of 45-50% along with mild LVH and mild MR.    LHC on 07/27/2019 showed: Prox RCA lesion is 40% stenosed. Ost LAD to Prox LAD lesion is 75% stenosed. 1st Mrg lesion is 30% stenosed.   Angiography done 08/02/2019: 1st Mrg lesion is 30% stenosed. Ost LAD lesion is 40% stenosed. Mid LAD lesion is 50% stenosed. Prox LAD lesion is 75% stenosed. Post intervention, there is a 0% residual stenosis. A drug-eluting stent was successfully placed using a STENT RESOLUTE ONYX 2.5X12.    She presents today for her initial visit with a chief complaint of mild fatigue. She states this has been present for several years and worse since her discharge, although it is getting better each day. She denies any CP, SOB, palpitations, dizziness, presyncope, syncope, increased weight gain, pedal or abdominal edema.   Past Medical History:  Diagnosis Date   Anemia    vitamin d deficiency   Anxiety    Breast cancer ([place])    bilateral mastectomies.  different types of cancer in each breast   Cardiomyopathy due to chemotherapy [facility]) 2011   Chronic anticoagulation    Apixaban   Chronic pain 2019   Chronic SI joint pain    Coronary artery disease    DDD (degenerative disc disease), lumbar    First degree AV block    GERD (gastroesophageal reflux disease)    Hyperlipidemia    Hypertension    Insomnia    Lumbar herniated disc    Neuropathy    Paroxysmal atrial fibrillation (HCC)     Personal history of chemotherapy    Personal history of radiation therapy    S/P drug eluting coronary stent placement 08/02/2019   2.5 x 12 mm Resolute Onyx to pLAD   Valvular insufficiency    Mild   Past Surgical History:  Procedure Laterality Date   APPENDECTOMY  1961   BREAST IMPLANT REMOVAL Bilateral 2016   d/t infection. nothing replaced   CARDIAC CATHETERIZATION  2018   no blockages. pt thought it was an MI, but it wasn't   CESAREAN SECTION     x 3   CORONARY STENT INTERVENTION N/A 08/02/2019   Procedure: CORONARY STENT INTERVENTION;  Surgeon: Wellington Hampshire, MD;  Location: [place] [facility];  Service: Cardiovascular;  Laterality: N/A;   EYE SURGERY Bilateral 2013   cataract extractions with iol. cornea replacement also   INTRAVASCULAR PRESSURE WIRE/FFR STUDY N/A 08/02/2019   Procedure: INTRAVASCULAR PRESSURE WIRE/FFR STUDY;  Surgeon: Wellington Hampshire, MD;  Location: [place] [facility];  Service: Cardiovascular;  Laterality: N/A;   KYPHOPLASTY N/A 06/17/2021   Procedure: L2 KYPHOPLASTY;  Surgeon: Hessie Knows, MD;  Location: [facility] OR[facility];  Service: Orthopedics;  Laterality: N/A;   LEFT HEART CATH AND CORONARY ANGIOGRAPHY Left 07/27/2019   Procedure: LEFT HEART CATH AND CORONARY ANGIOGRAPHY;  Surgeon: Corey Skains, MD;  Location: [place] [facility];  Service: Cardiovascular;  Laterality: Left;   LUMBAR LAMINECTOMY/DECOMPRESSION MICRODISCECTOMY N/A 06/29/2018   Procedure: LUMBAR LAMINECTOMY/DECOMPRESSION MICRODISCECTOMY 1 LEVEL-L4-5, L5-S1 LATERAL DISCECTOMY;  Surgeon: Meade Maw, MD;  Location: [facility] OR[facility];  Service: Neurosurgery;  Laterality: N/A;   MASTECTOMY Bilateral 2011, 2016   5 diff procedures. reconstruction saline removed d/t infx   nsvd     x 1   PLACEMENT OF BREAST IMPLANTS Bilateral 2016   PORT-A-CATH REMOVAL  2016   TONSILLECTOMY     Family History  Problem Relation Age of Onset   Heart failure Mother    Hypertension Mother    Cervical  cancer Mother    Renal Disease Mother    Cancer Father    Tuberculosis Maternal Grandfather    Breast cancer Paternal Grandmother    Breast cancer Paternal Aunt    Social History   Tobacco Use   Smoking status: Former    Years: 3.00    Types: Cigarettes   Smokeless tobacco: Never  Substance Use Topics   Alcohol use: No   Allergies  Allergen Reactions   Hydrocodone-Acetaminophen Nausea And Vomiting    Severe vomitting   Morphine And Related Nausea And Vomiting    Pt preference    Ciprofloxacin Other (See Comments)    Joint pain    Gabapentin Other (See Comments)    Eyes blurry; vision decreased, dizziness    Prior to Admission medications   Medication Sig Start Date End Date Taking? Authorizing Provider  alendronate (FOSAMAX) 70 MG tablet Take 70 mg by mouth once a week. 08/21/21  Yes [provider]  amiodarone (PACERONE) 200 MG tablet Take 200 mg by mouth 2 (two) times daily. 04/17/21  Yes [provider]  atorvastatin (LIPITOR) 10 MG tablet Take 1 tablet (10 mg total) by mouth daily. 10/08/21 11/07/21 Yes Nolberto Hanlon, MD  Cholecalciferol (VITAMIN D3 PO) Take 1 tablet by mouth in the morning.   Yes [provider]  ELIQUIS 5 MG TABS tablet Take 5 mg by mouth 2 (two) times daily. 06/01/21  Yes [provider]  levothyroxine (SYNTHROID) 25 MCG tablet Take 25 mcg by mouth in the morning. 06/11/21  Yes [provider]  MAGNESIUM PO Take 1 tablet by mouth in the morning.   Yes [provider]  metoprolol succinate (TOPROL-XL) 25 MG 24 hr tablet Take 0.5 tablets (12.5 mg total) by mouth 2 (two) times daily. 10/07/21 11/06/21 Yes Nolberto Hanlon, MD   Review of Systems  Constitutional:  Positive for malaise/fatigue and weight loss (from diuresing).  HENT: Negative.    Eyes: Negative.   Respiratory:  Negative for cough, sputum production, shortness of breath and wheezing.   Cardiovascular:  Negative for chest pain, palpitations,  orthopnea and leg swelling.  Gastrointestinal: Negative.   Genitourinary: Negative.   Musculoskeletal:  Positive for back pain, falls (none within the last three weeks) and neck pain.  Skin: Negative.   Neurological:  Negative for dizziness, weakness and headaches.  Endo/Heme/Allergies: Negative.   Psychiatric/Behavioral: Negative.      Vitals with BMI 10/13/2021 10/07/2021 10/07/2021  Height 5\' 5"  - -  Weight 146 lbs - -  BMI 34.1 - -  Systolic 937 902 409  Diastolic 82 75 74  Pulse 72 71 72    Lab Results  Component Value Date   CREATININE 1.34 (H) 10/07/2021   CREATININE 2.21 (H) 10/06/2021   CREATININE 2.01 (H) 10/05/2021    Physical Exam Vitals and nursing note reviewed. Exam conducted with a chaperone present (husband).  Constitutional:      General: She is not in acute distress. Neck:     Vascular: No carotid bruit.  Cardiovascular:     Rate and Rhythm: Normal rate and regular rhythm.     Pulses: Normal pulses.     Heart sounds: Normal heart sounds.  Pulmonary:     Effort: No respiratory distress.     Breath sounds: No wheezing or rales.  Abdominal:     Palpations: Abdomen is soft.  Musculoskeletal:        General: No swelling.     Right lower leg: No edema.     Left lower leg: No edema.  Skin:    General: Skin is warm and dry.  Neurological:     General: No focal deficit present.     Mental Status: She is alert and oriented to person, place, and time.  Psychiatric:        Mood and Affect: Mood normal.        Thought Content: Thought content normal.      Assessment & Plan:   1: Chronic heart failure with mildly reduced ejection fraction- - NYHA class II - euvolemic today  - BNP 10/03/21 was 745.3 - low sodium diet discussed, she states she adheres to low sodium - provided low sodium cookbook - she weighs daily but her scale is broken - provided new scale - reviewed to notify us or her cardiologist with a wt gain > 3 lbs/overnight or 5lbs/week -  denies flu vaccine   2: HTN- - BP 134/82, she checks daily at home and it was 124/87 at home - saw PCP 09/16/21, will see again on 10/31 - BMP 10/07/21 reviewed and showed sodium 137, potassium 4.2, creatinine 1.34 and GFR 41 - Valsartan was stopped during hospitalization - metoprolol succinate was changed to 12.5 mg BID, she is curious which dose to take, advised her to take per d/c orders until she sees Nehemiah Massed   3: CAD- - stent placed in LAD in 2020 - wants to do cardiac rehab again and will ask cardiologist about this  4: Paroxysmal atrial fibrillation- - saw cardiology Nehemiah Massed) 09/12/21, will see him again next week - RRR today   Medication list reviewed.  Return in three months or sooner for worsening or new HF s/s.

## 2021-10-13 NOTE — Patient Instructions (Signed)
Continue to weigh daily, call the heart failure clinic if you gain > 3 lbs/overnight or > 5 lbs/week  Continue to take your metoprolol per your discharge instructions until you f/u with Kelsey Carter

## 2021-10-15 NOTE — Progress Notes (Signed)
10/16/21 9:56 AM   Kelsey Carter March 14, 1946 [id number]  Referring provider:  Tracie Harrier, MD [address] [facility] [place],  [place] [address] Chief Complaint  Patient presents with   Hospitalization Follow-up      HPI: Kelsey Carter is a 75 y.o.female who presents today for further evaluation of renal injury.   She has a history of breast cancer.   She was seen in the ED on 1014/2022 for palpitations and chest pain. She developed acute renal injury due to diuresis which was agressive. This resolved with gentle hydration and increasing her p.o. intake.  A renal ultrasound was done revealing left hydronephrosis while her creatinine was elevated.Urology was consulted, CT renal protocal was ordered with results below.  The CT shows no hydronephrosis.  Presumably PVRs were moderately elevated but unclear values.  RUS on 10/06/2021 revealed mild left hydronephrosis suspicious for obstructive uropathy. CT renal stone study revealed no acue findins and no hydronephrosis.   She is accompanied by her husband today. She reports the she had no issues with her bladder before.  She is voiding well today.     PMH: Past Medical History:  Diagnosis Date   Anemia    vitamin d deficiency   Anxiety    Breast cancer ([place])    bilateral mastectomies.  different types of cancer in each breast   Cardiomyopathy due to chemotherapy [facility]) 2011   Chronic anticoagulation    Apixaban   Chronic pain 2019   Chronic SI joint pain    Coronary artery disease    DDD (degenerative disc disease), lumbar    First degree AV block    GERD (gastroesophageal reflux disease)    Hyperlipidemia    Hypertension    Insomnia    Lumbar herniated disc    Neuropathy    Paroxysmal atrial fibrillation (HCC)    Personal history of chemotherapy    Personal history of radiation therapy    S/P drug eluting coronary stent placement 08/02/2019   2.5 x 12 mm Resolute Onyx to pLAD    Valvular insufficiency    Mild    Surgical History: Past Surgical History:  Procedure Laterality Date   APPENDECTOMY  1961   BREAST IMPLANT REMOVAL Bilateral 2016   d/t infection. nothing replaced   CARDIAC CATHETERIZATION  2018   no blockages. pt thought it was an MI, but it wasn't   CESAREAN SECTION     x 3   CORONARY STENT INTERVENTION N/A 08/02/2019   Procedure: CORONARY STENT INTERVENTION;  Surgeon: Wellington Hampshire, MD;  Location: [place] [facility];  Service: Cardiovascular;  Laterality: N/A;   EYE SURGERY Bilateral 2013   cataract extractions with iol. cornea replacement also   INTRAVASCULAR PRESSURE WIRE/FFR STUDY N/A 08/02/2019   Procedure: INTRAVASCULAR PRESSURE WIRE/FFR STUDY;  Surgeon: Wellington Hampshire, MD;  Location: [place] [facility];  Service: Cardiovascular;  Laterality: N/A;   KYPHOPLASTY N/A 06/17/2021   Procedure: L2 KYPHOPLASTY;  Surgeon: Hessie Knows, MD;  Location: [facility] OR[facility];  Service: Orthopedics;  Laterality: N/A;   LEFT HEART CATH AND CORONARY ANGIOGRAPHY Left 07/27/2019   Procedure: LEFT HEART CATH AND CORONARY ANGIOGRAPHY;  Surgeon: Corey Skains, MD;  Location: [place] [facility];  Service: Cardiovascular;  Laterality: Left;   LUMBAR LAMINECTOMY/DECOMPRESSION MICRODISCECTOMY N/A 06/29/2018   Procedure: LUMBAR LAMINECTOMY/DECOMPRESSION MICRODISCECTOMY 1 LEVEL-L4-5, L5-S1 LATERAL DISCECTOMY;  Surgeon: Meade Maw, MD;  Location: [facility] OR[facility];  Service: Neurosurgery;  Laterality: N/A;   MASTECTOMY  Bilateral 2011, 2016   5 diff procedures. reconstruction saline removed d/t infx   nsvd     x 1   PLACEMENT OF BREAST IMPLANTS Bilateral 2016   PORT-A-CATH REMOVAL  2016   TONSILLECTOMY      Home Medications:  Allergies as of 10/16/2021       Reactions   Hydrocodone-acetaminophen Nausea And Vomiting   Severe vomitting   Morphine And Related Nausea And Vomiting   Pt preference    Ciprofloxacin Other (See Comments)   Joint pain   Gabapentin  Other (See Comments)   Eyes blurry; vision decreased, dizziness         Medication List        Accurate as of October 16, 2021  9:56 AM. If you have any questions, ask your nurse or doctor.          alendronate 70 MG tablet Commonly known as: FOSAMAX Take 70 mg by mouth once a week.   amiodarone 200 MG tablet Commonly known as: PACERONE Take 200 mg by mouth 2 (two) times daily.   atorvastatin 10 MG tablet Commonly known as: LIPITOR Take 1 tablet (10 mg total) by mouth daily.   Eliquis 5 MG Tabs tablet Generic drug: apixaban Take 5 mg by mouth 2 (two) times daily.   levothyroxine 25 MCG tablet Commonly known as: SYNTHROID Take 25 mcg by mouth in the morning.   MAGNESIUM PO Take 1 tablet by mouth in the morning.   metoprolol succinate 25 MG 24 hr tablet Commonly known as: TOPROL-XL Take 0.5 tablets (12.5 mg total) by mouth 2 (two) times daily.   VITAMIN D3 PO Take 1 tablet by mouth in the morning.        Allergies:  Allergies  Allergen Reactions   Hydrocodone-Acetaminophen Nausea And Vomiting    Severe vomitting   Morphine And Related Nausea And Vomiting    Pt preference    Ciprofloxacin Other (See Comments)    Joint pain    Gabapentin Other (See Comments)    Eyes blurry; vision decreased, dizziness     Family History: Family History  Problem Relation Age of Onset   Heart failure Mother    Hypertension Mother    Cervical cancer Mother    Renal Disease Mother    Cancer Father    Tuberculosis Maternal Grandfather    Breast cancer Paternal Grandmother    Breast cancer Paternal Aunt     Social History:  reports that she has quit smoking. Her smoking use included cigarettes. She has never used smokeless tobacco. She reports current drug use. She reports that she does not drink alcohol.   Physical Exam: BP (!) 155/92   Pulse 96   Ht 5\' 5"  (1.651 m)   Wt 144 lb (65.3 kg)   BMI 23.96 kg/m   Constitutional:  Alert and oriented, No acute  distress. HEENT: [place] AT, moist mucus membranes.  Trachea midline, no masses. Cardiovascular: No clubbing, cyanosis, or edema. Respiratory: Normal respiratory effort, no increased work of breathing. Skin: No rashes, bruises or suspicious lesions. Neurologic: Grossly intact, no focal deficits, moving all 4 extremities. Psychiatric: Normal mood and affect.  Laboratory Data:  Lab Results  Component Value Date   CREATININE 1.34 (H) 10/07/2021    Lab Results  Component Value Date   HGBA1C 5.6 09/21/2018    Pertinent Imaging: CLINICAL DATA:  Acute kidney injury   EXAM: RENAL / URINARY TRACT ULTRASOUND COMPLETE   COMPARISON:  CT chest abdomen pelvis  07/24/2008   FINDINGS: Right Kidney:   Renal measurements: 10.1 x 4.8 x 5.2 cm = volume: 131 mL. Echogenicity within normal limits. No mass or hydronephrosis visualized. Multiple simple cysts are present with the largest measuring 2.0 cm.   Left Kidney:   Renal measurements: 10.6 x 3.5 x 4.0 cm = volume: 79 mL. Echogenicity within normal limits. Mild hydronephrosis. 3.2 cm simple cyst is noted.   Bladder:   Appears normal for degree of bladder distention.   Other:   None.   IMPRESSION: Mild left hydronephrosis suspicious for obstructive uropathy.     Electronically Signed   By: Miachel Roux M.D.   On: 10/06/2021 12:28  CLINICAL DATA:  Bladder neck construction, history of breast cancer.   EXAM: CT ABDOMEN AND PELVIS WITHOUT CONTRAST   TECHNIQUE: Multidetector CT imaging of the abdomen and pelvis was performed following the standard protocol without IV contrast.   COMPARISON:  Renal ultrasound 10/06/2021.   FINDINGS: Lower chest: Mild bronchiectasis and volume loss in the medial lower lobes. Heart is at the upper limits of normal in size to mildly enlarged. No pericardial or pleural effusion. Distal esophagus is grossly unremarkable.   Hepatobiliary: Liver and gallbladder are unremarkable. No  biliary ductal dilatation.   Pancreas: Negative.   Spleen: Negative.   Adrenals/Urinary Tract: Adrenal glands are unremarkable. Low-attenuation lesions in the kidneys measure up to 3 cm on the left and are likely cysts. No urinary stones. Ureters are decompressed. Bladder is grossly unremarkable.   Stomach/Bowel: Tiny hiatal hernia. Stomach is decompressed. Stomach, small bowel and colon are otherwise unremarkable. Appendix is not readily visualized.   Vascular/Lymphatic: Atherosclerotic calcification of the aorta. No pathologically enlarged lymph nodes.   Reproductive: Uterus is visualized.  No adnexal mass.   Other: No free fluid.  Mesenteries and peritoneum are unremarkable.   Musculoskeletal: Degenerative changes in the spine. Levoconvex scoliosis. L2 vertebral body augmentation.   IMPRESSION: 1. No acute findings.  No hydronephrosis. 2.  Aortic atherosclerosis (ICD10-I70.0).     Electronically Signed   By: Lorin Picket M.D.   On: 10/06/2021 15:24  Results for orders placed or performed in visit on 10/16/21  BLADDER SCAN AMB NON-IMAGING  Result Value Ref Range   Scan Result 115 ml    Above RUS and CT scan personally reviewed, agree with interpretation.  PVR 115 but voided well over an hour ago.     Assessment & Plan:    Incomplete bladder emptying  - Transient likely related to massive diuresis - She is emptying reasonably well today and has no baseline urinary symptoms    2. Hydronephrosis  - Resolved  - Follow-up CT was reassuring   3. Acute kidney injury  - Likely iatrogenic from aggressive duiresis - Improved prior to discharge  - Unlikely related to bladder    Follow-up as needed   I,Kailey Littlejohn,acting as a scribe for Hollice Espy, MD.,have documented all relevant documentation on the behalf of Hollice Espy, MD,as directed by  Hollice Espy, MD while in the presence of Hollice Espy, MD.  I have reviewed the above documentation for  accuracy and completeness, and I agree with the above.   Hollice Espy, MD  [facility] [address] [place], [place] [address] [phone]  I spent 31 total minutes on the day of the encounter including pre-visit review of the medical record, face-to-face time with the patient, and post visit ordering of labs/imaging/tests.

## 2021-10-16 ENCOUNTER — Ambulatory Visit: Payer: PPO | Admitting: Urology

## 2021-10-16 ENCOUNTER — Other Ambulatory Visit: Payer: Self-pay

## 2021-10-16 ENCOUNTER — Encounter: Payer: Self-pay | Admitting: Urology

## 2021-10-16 VITALS — BP 155/92 | HR 96 | Ht 65.0 in | Wt 144.0 lb

## 2021-10-16 DIAGNOSIS — N289 Disorder of kidney and ureter, unspecified: Secondary | ICD-10-CM

## 2021-10-16 LAB — BLADDER SCAN AMB NON-IMAGING: Scan Result: 115

## 2021-10-20 DIAGNOSIS — C50912 Malignant neoplasm of unspecified site of left female breast: Secondary | ICD-10-CM | POA: Diagnosis not present

## 2021-10-20 DIAGNOSIS — Z09 Encounter for follow-up examination after completed treatment for conditions other than malignant neoplasm: Secondary | ICD-10-CM | POA: Diagnosis not present

## 2021-10-20 DIAGNOSIS — I480 Paroxysmal atrial fibrillation: Secondary | ICD-10-CM | POA: Diagnosis not present

## 2021-10-20 DIAGNOSIS — I509 Heart failure, unspecified: Secondary | ICD-10-CM | POA: Diagnosis not present

## 2021-10-20 DIAGNOSIS — I10 Essential (primary) hypertension: Secondary | ICD-10-CM | POA: Diagnosis not present

## 2021-10-20 DIAGNOSIS — Z9889 Other specified postprocedural states: Secondary | ICD-10-CM | POA: Diagnosis not present

## 2021-10-20 DIAGNOSIS — Z9013 Acquired absence of bilateral breasts and nipples: Secondary | ICD-10-CM | POA: Diagnosis not present

## 2021-10-20 DIAGNOSIS — C50911 Malignant neoplasm of unspecified site of right female breast: Secondary | ICD-10-CM | POA: Diagnosis not present

## 2021-10-23 DIAGNOSIS — I10 Essential (primary) hypertension: Secondary | ICD-10-CM | POA: Diagnosis not present

## 2021-10-23 DIAGNOSIS — E782 Mixed hyperlipidemia: Secondary | ICD-10-CM | POA: Diagnosis not present

## 2021-10-23 DIAGNOSIS — I5033 Acute on chronic diastolic (congestive) heart failure: Secondary | ICD-10-CM | POA: Diagnosis not present

## 2021-10-23 DIAGNOSIS — I480 Paroxysmal atrial fibrillation: Secondary | ICD-10-CM | POA: Diagnosis not present

## 2021-10-23 DIAGNOSIS — I2510 Atherosclerotic heart disease of native coronary artery without angina pectoris: Secondary | ICD-10-CM | POA: Diagnosis not present

## 2021-10-30 ENCOUNTER — Other Ambulatory Visit: Payer: Self-pay

## 2021-10-30 ENCOUNTER — Emergency Department: Payer: PPO

## 2021-10-30 ENCOUNTER — Emergency Department
Admission: EM | Admit: 2021-10-30 | Discharge: 2021-10-30 | Disposition: A | Discharge status: Home / Self Care | Payer: PPO | Attending: Emergency Medicine | Admitting: Emergency Medicine

## 2021-10-30 ENCOUNTER — Encounter: Payer: Self-pay | Admitting: Emergency Medicine

## 2021-10-30 DIAGNOSIS — R079 Chest pain, unspecified: Secondary | ICD-10-CM | POA: Diagnosis not present

## 2021-10-30 DIAGNOSIS — Z87891 Personal history of nicotine dependence: Secondary | ICD-10-CM | POA: Diagnosis not present

## 2021-10-30 DIAGNOSIS — I5033 Acute on chronic diastolic (congestive) heart failure: Secondary | ICD-10-CM | POA: Insufficient documentation

## 2021-10-30 DIAGNOSIS — Z7982 Long term (current) use of aspirin: Secondary | ICD-10-CM | POA: Insufficient documentation

## 2021-10-30 DIAGNOSIS — Z853 Personal history of malignant neoplasm of breast: Secondary | ICD-10-CM | POA: Insufficient documentation

## 2021-10-30 DIAGNOSIS — I4891 Unspecified atrial fibrillation: Secondary | ICD-10-CM | POA: Diagnosis not present

## 2021-10-30 DIAGNOSIS — Z79899 Other long term (current) drug therapy: Secondary | ICD-10-CM | POA: Insufficient documentation

## 2021-10-30 DIAGNOSIS — I2510 Atherosclerotic heart disease of native coronary artery without angina pectoris: Secondary | ICD-10-CM | POA: Diagnosis not present

## 2021-10-30 DIAGNOSIS — I110 Hypertensive heart disease with heart failure: Secondary | ICD-10-CM | POA: Insufficient documentation

## 2021-10-30 DIAGNOSIS — R059 Cough, unspecified: Secondary | ICD-10-CM | POA: Diagnosis not present

## 2021-10-30 DIAGNOSIS — R0789 Other chest pain: Secondary | ICD-10-CM | POA: Diagnosis not present

## 2021-10-30 LAB — CBC
HCT: 32.3 % — ABNORMAL LOW (ref 36.0–46.0)
Hemoglobin: 10.8 g/dL — ABNORMAL LOW (ref 12.0–15.0)
MCH: 33.6 pg (ref 26.0–34.0)
MCHC: 33.4 g/dL (ref 30.0–36.0)
MCV: 100.6 fL — ABNORMAL HIGH (ref 80.0–100.0)
Platelets: 300 10*3/uL (ref 150–400)
RBC: 3.21 MIL/uL — ABNORMAL LOW (ref 3.87–5.11)
RDW: 14 % (ref 11.5–15.5)
WBC: 6.1 10*3/uL (ref 4.0–10.5)
nRBC: 0.8 % — ABNORMAL HIGH (ref 0.0–0.2)

## 2021-10-30 LAB — BASIC METABOLIC PANEL
Anion gap: 6 (ref 5–15)
BUN: 23 mg/dL (ref 8–23)
CO2: 24 mmol/L (ref 22–32)
Calcium: 8.9 mg/dL (ref 8.9–10.3)
Chloride: 105 mmol/L (ref 98–111)
Creatinine, Ser: 1.18 mg/dL — ABNORMAL HIGH (ref 0.44–1.00)
GFR, Estimated: 48 mL/min — ABNORMAL LOW (ref >60)
Glucose, Bld: 127 mg/dL — ABNORMAL HIGH (ref 70–99)
Potassium: 3.9 mmol/L (ref 3.5–5.1)
Sodium: 135 mmol/L (ref 135–145)

## 2021-10-30 LAB — TROPONIN I (HIGH SENSITIVITY)
Troponin I (High Sensitivity): 12 ng/L (ref <18)
Troponin I (High Sensitivity): 11 ng/L (ref <18)

## 2021-10-30 LAB — BRAIN NATRIURETIC PEPTIDE: B Natriuretic Peptide: 733.2 pg/mL — ABNORMAL HIGH (ref 0.0–100.0)

## 2021-10-30 MED ORDER — FUROSEMIDE 10 MG/ML IJ SOLN
60.0000 mg | Freq: Once | INTRAMUSCULAR | Status: AC
  Administered 2021-10-30: 60 mg via INTRAVENOUS
  Filled 2021-10-30: qty 8

## 2021-10-30 MED ORDER — FUROSEMIDE 20 MG PO TABS: 20.0000 mg | ORAL_TABLET | Freq: Every day | ORAL | 1 refills | Status: AC

## 2021-10-30 NOTE — ED Triage Notes (Signed)
Pt to ED from home c/o HTN, non productive cough, and chest pain since this morning at 1am that woke her up. Pt has hx of CHF, cardiologist is Dr. Adolph Pollack.  Pt A&Ox4, chest rise even and unlabored, in NAD at this time.

## 2021-10-30 NOTE — ED Provider Notes (Signed)
To the  [facility] Emergency Department Provider Note ____________________________________________   Event Date/Time   First MD Initiated Contact with Patient 10/30/21 [phone]     (approximate)  I have reviewed the triage vital signs and the nursing notes.  HISTORY  Chief Complaint Hypertension   HPI Kelsey Carter is a 75 y.o. femalewho presents to the ED for evaluation of hypertension.   Chart review indicates diastolic CHF, CAD s/p stenting, paroxysmal atrial fibrillation on before meals and amiodarone, HTN, HLD.  Valsartan was stopped during a medical admission, for CHF, last week due to an AKI.  Patient presents to the ED, accompanied by her husband, for evaluation of chest discomfort, shortness of breath and poor urination.  She reports intermittent chest discomfort over the past couple days, increasingly, but worse this morning when she was awoken from sleep due to left-sided chest pain.  She reports this pain has improved.  She reports her breathing does "not feel normal."  She denies orthopnea, shortness of breath, but does report nonproductive cough.  Reports poor urinary output without dysuria or hematuria. Further reports 3 pound weight gain this morning from yesterday.  Past Medical History:  Diagnosis Date   Anemia    vitamin d deficiency   Anxiety    Breast cancer (Kelsey Carter)    bilateral mastectomies.  different types of cancer in each breast   Cardiomyopathy due to chemotherapy [facility]) 2011   Chronic anticoagulation    Apixaban   Chronic pain 2019   Chronic SI joint pain    Coronary artery disease    DDD (degenerative disc disease), lumbar    First degree AV block    GERD (gastroesophageal reflux disease)    Hyperlipidemia    Hypertension    Insomnia    Lumbar herniated disc    Neuropathy    Paroxysmal atrial fibrillation (HCC)    Personal history of chemotherapy    Personal history of radiation therapy    S/P drug eluting coronary  stent placement 08/02/2019   2.5 x 12 mm Resolute Onyx to pLAD   Valvular insufficiency    Mild    Patient Active Problem List   Diagnosis Date Noted   New onset of congestive heart failure ([place]) 10/03/2021   PAF (paroxysmal atrial fibrillation) ([place]) 10/03/2021   Effort angina ([place]) 08/02/2019   Angina pectoris ([place]) 07/20/2019   H/O bilateral mastectomy 02/14/2019   Neuropathy due to chemotherapeutic drug ([place]) 02/14/2019   Anxiety 03/23/2018   Prediabetes 03/23/2018   DDD (degenerative disc disease), lumbar 03/02/2018   Chronic low back pain (Primary Area of Pain) (Bilateral) (L>R) with left-sided sciatica 02/15/2018   Chronic lower extremity pain (Secondary Area of Pain) (Left) 02/15/2018   Chronic pain syndrome 02/15/2018   Long term prescription benzodiazepine use 02/15/2018   Disorder of skeletal system 02/15/2018   Problems influencing health status 02/15/2018   Chronic sacroiliac joint pain 02/15/2018   Benign essential HTN 10/05/2017   Coronary artery disease involving native coronary artery of native heart 10/05/2017   Hyperlipidemia, mixed 10/05/2017   Elevated TSH 09/21/2017   Cardiomyopathy due to chemotherapy ([place]) 08/24/2017   Insomnia 08/24/2017   Peripheral neuropathy due to chemotherapy ([place]) 08/24/2017   Bilateral breast cancer ([place]) 08/24/2017   Onychomycosis 08/24/2017   Overweight (BMI 25.0-29.9) 08/24/2017   Vitamin D deficiency 08/24/2017   Hyperlipidemia 08/24/2017    Past Surgical History:  Procedure Laterality Date   APPENDECTOMY  1961   BREAST IMPLANT REMOVAL Bilateral 2016  d/t infection. nothing replaced   CARDIAC CATHETERIZATION  2018   no blockages. pt thought it was an MI, but it wasn't   CESAREAN SECTION     x 3   CORONARY STENT INTERVENTION N/A 08/02/2019   Procedure: CORONARY STENT INTERVENTION;  Surgeon: [place], MD;  Location: [facility];  Service: Cardiovascular;  Laterality: N/A;   EYE SURGERY Bilateral 2013    cataract extractions with iol. cornea replacement also   INTRAVASCULAR PRESSURE WIRE/FFR STUDY N/A 08/02/2019   Procedure: INTRAVASCULAR PRESSURE WIRE/FFR STUDY;  Surgeon: Wellington [place], MD;  Location: [place] [facility];  Service: Cardiovascular;  Laterality: N/A;   KYPHOPLASTY N/A 06/17/2021   Procedure: L2 KYPHOPLASTY;  Surgeon: Hessie Knows, MD;  Location: [facility];  Service: Orthopedics;  Laterality: N/A;   LEFT HEART CATH AND CORONARY ANGIOGRAPHY Left 07/27/2019   Procedure: LEFT HEART CATH AND CORONARY ANGIOGRAPHY;  Surgeon: Corey Skains, MD;  Location: [place] [facility];  Service: Cardiovascular;  Laterality: Left;   LUMBAR LAMINECTOMY/DECOMPRESSION MICRODISCECTOMY N/A 06/29/2018   Procedure: LUMBAR LAMINECTOMY/DECOMPRESSION MICRODISCECTOMY 1 LEVEL-L4-5, L5-S1 LATERAL DISCECTOMY;  Surgeon: Meade Maw, MD;  Location: [facility];  Service: Neurosurgery;  Laterality: N/A;   MASTECTOMY Bilateral 2011, 2016   5 diff procedures. reconstruction saline removed d/t infx   nsvd     x 1   PLACEMENT OF BREAST IMPLANTS Bilateral 2016   PORT-A-CATH REMOVAL  2016   TONSILLECTOMY      Prior to Admission medications   Medication Sig Start Date End Date Taking? Authorizing Provider  furosemide (LASIX) 20 MG tablet Take 1 tablet (20 mg total) by mouth daily. 10/30/21 10/30/22 Yes Vladimir Crofts, MD  alendronate (FOSAMAX) 70 MG tablet Take 70 mg by mouth once a week. 08/21/21   [provider]  amiodarone (PACERONE) 200 MG tablet Take 200 mg by mouth 2 (two) times daily. 04/17/21   [provider]  atorvastatin (LIPITOR) 10 MG tablet Take 1 tablet (10 mg total) by mouth daily. 10/08/21 11/07/21  Nolberto Hanlon, MD  Cholecalciferol (VITAMIN D3 PO) Take 1 tablet by mouth in the morning.    [provider]  ELIQUIS 5 MG TABS tablet Take 5 mg by mouth 2 (two) times daily. 06/01/21   [provider]  levothyroxine (SYNTHROID) 25 MCG tablet Take 25 mcg by  mouth in the morning. 06/11/21   [provider]  MAGNESIUM PO Take 1 tablet by mouth in the morning.    [provider]  meclizine (ANTIVERT) 12.5 MG tablet Take 12.5 mg by mouth 3 (three) times daily. 10/21/21   [provider]  metoprolol succinate (TOPROL-XL) 25 MG 24 hr tablet Take 0.5 tablets (12.5 mg total) by mouth 2 (two) times daily. 10/07/21 11/06/21  Nolberto Hanlon, MD  Potassium Citrate 99 MG CAPS Take 1 tablet by mouth daily.    [provider]    Allergies Hydrocodone-acetaminophen, Morphine and related, Ciprofloxacin, and Gabapentin  Family History  Problem Relation Age of Onset   Heart failure Mother    Hypertension Mother    Cervical cancer Mother    Renal Disease Mother    Cancer Father    Tuberculosis Maternal Grandfather    Breast cancer Paternal Grandmother    Breast cancer Paternal Aunt     Social History Social History   Tobacco Use   Smoking status: Former    Years: 3.00    Types: Cigarettes   Smokeless tobacco: Never  Vaping Use  Vaping Use: Never used  Substance Use Topics   Alcohol use: No   Drug use: Yes    Comment: prescribed narcotics for back pain   Review of Systems  Constitutional: No fever/chills Eyes: No visual changes. ENT: No sore throat. Cardiovascular: Positive for chest pain. Respiratory: Denies shortness of breath.  Positive for nonproductive cough Gastrointestinal: No abdominal pain.  No nausea, no vomiting.  No diarrhea.  No constipation. Genitourinary: Negative for dysuria.  Positive for decreased urination. Musculoskeletal: Negative for back pain. Skin: Negative for rash. Neurological: Negative for headaches, focal weakness or numbness. ____________________________________________   PHYSICAL EXAM:  VITAL SIGNS: Vitals:   10/30/21 0454 10/30/21 0552  BP: (!) 167/102 (!) 147/95  Pulse: 94 65  Resp: 16 17  Temp: 98.1 F (36.7 C)   SpO2: 97% 93%      Constitutional: Alert and  oriented. Well appearing and in no acute distress. Eyes: Conjunctivae are normal. PERRL. EOMI. Head: Atraumatic. Nose: No congestion/rhinnorhea. Mouth/Throat: Mucous membranes are moist.  Oropharynx non-erythematous. Neck: No stridor. No cervical spine tenderness to palpation. Cardiovascular: Normal rate, regular rhythm. Grossly normal heart sounds.  Good peripheral circulation. Respiratory: Normal respiratory effort.  No retractions. Lungs CTAB. Gastrointestinal: Soft , nondistended, nontender to palpation. No CVA tenderness. Musculoskeletal: No lower extremity tenderness nor edema.  No joint effusions. No signs of acute trauma. Neurologic:  Normal speech and language. No gross focal neurologic deficits are appreciated. No gait instability noted. Skin:  Skin is warm, dry and intact. No rash noted. Psychiatric: Mood and affect are normal. Speech and behavior are normal.  ____________________________________________   LABS (all labs ordered are listed, but only abnormal results are displayed)  Labs Reviewed  BASIC METABOLIC PANEL - Abnormal; Notable for the following components:      Result Value   Glucose, Bld 127 (*)    Creatinine, Ser 1.18 (*)    GFR, Estimated 48 (*)    All other components within normal limits  CBC - Abnormal; Notable for the following components:   RBC 3.21 (*)    Hemoglobin 10.8 (*)    HCT 32.3 (*)    MCV 100.6 (*)    nRBC 0.8 (*)    All other components within normal limits  BRAIN NATRIURETIC PEPTIDE - Abnormal; Notable for the following components:   B Natriuretic Peptide 733.2 (*)    All other components within normal limits  TROPONIN I (HIGH SENSITIVITY)  TROPONIN I (HIGH SENSITIVITY)   ____________________________________________  12 Lead EKG  Sinus rhythm, rate of 91 bpm.  Leftward axis.  Left bundle and first-degree AV block.  No evidence of acute ischemia per Sgarbossa criteria. Similar to EKG from 1 month  ago ____________________________________________  RADIOLOGY  ED MD interpretation: 1 view CXR reviewed by me with pulmonary vascular congestion  Official radiology report(s): DG Chest Port 1 View  Result Date: 10/30/2021 CLINICAL DATA:  Cough and chest pain. EXAM: PORTABLE CHEST 1 VIEW COMPARISON:  10/03/2021 FINDINGS: The heart is mildly enlarged but stable. Stable tortuosity and calcification of the thoracic aorta. Chronic lung changes with probable underlying emphysema and areas of bronchiectasis. Central vascular congestion and peripheral increased inter septal lines (Kerley B-lines) suggesting interstitial edema. No pulmonary infiltrates or pleural effusions. IMPRESSION: 1. Cardiac enlargement, vascular congestion and mild interstitial edema. 2. Chronic lung disease. Electronically Signed   By: Marijo Sanes M.D.   On: 10/30/2021 05:25    ____________________________________________   PROCEDURES and INTERVENTIONS  Procedure(s) performed (including Critical Care):  .1-3  Lead EKG Interpretation Performed by: Vladimir Crofts, MD Authorized by: Vladimir Crofts, MD     Interpretation: normal     ECG rate:  90   ECG rate assessment: normal     Rhythm: sinus rhythm     Ectopy: none     Conduction: normal    Medications  furosemide (LASIX) injection 60 mg (60 mg Intravenous Given 10/30/21 0637)    ____________________________________________   MDM / ED COURSE   Pleasant 75 year old woman ED with evidence of a mild symptomatic CHF exacerbation.  I suspect her nonproductive cough and chest pressure related to volume overload.  While she does not have much evidence of peripheral edema, her CXR is congested and her BNP is elevated.  No evidence of ACS, CAP or significant renal dysfunction.  We will provide a dose of IV Lasix, plan to follow-up a second troponin and anticipate she be suitable for outpatient management with close follow-up with the heart failure clinic.  I discussed return  precautions with the patient prior to transfer of care, and she will be signed out to oncoming provider to follow-up on the second troponin and her clinical status.  Clinical Course as of 10/30/21 0641  Thu Oct 30, 2021  0636 Reassessed.  Patient reports feeling well.  We discussed work-up with evidence of mild CHF exacerbation.  We discussed that she probably does not need to be hospitalized, but would be prudent to give IV diuretics, recheck a troponin and have her follow-up closely with the heart failure clinic.  We discussed starting her on low-dose Lasix .  She is in agreement with plan of care, thankful that she does not need admission [DS]    Clinical Course User Index [DS] Vladimir Crofts, MD    ____________________________________________   FINAL CLINICAL IMPRESSION(S) / ED DIAGNOSES  Final diagnoses:  Chest pain  Acute on chronic diastolic congestive heart failure [facility])     ED Discharge Orders          Ordered    AMB referral to CHF clinic        10/30/21 0637    furosemide (LASIX) 20 MG tablet  Daily        10/30/21 0637             Vladimir Crofts   Note:  This document was prepared using Dragon voice recognition software and may include unintentional dictation errors.    Vladimir Crofts, MD 10/30/21 [phone]

## 2021-10-30 NOTE — Discharge Instructions (Signed)
As we discussed, we're starting you on a low dose of Lasix - a common diuretic used to treat heart failure. Please start taking this medication once daily, in addition to your other regular medications.   The heart failure clinic should reach out to you by phone later today to schedule a follow up visit in the next few days.   If you develop any worsening symptoms despite these measures, please return to the ED.

## 2021-10-30 NOTE — ED Provider Notes (Signed)
Emergency department handoff note  Care of this patient was signed out to me at the end of the previous provider shift.  All pertinent patient information was conveyed and all questions were answered.  Patient pending repeat troponin which was normal.  The patient has been reexamined and is ready to be discharged.  All diagnostic results have been reviewed and discussed with the patient/family.  Care plan has been outlined and the patient/family understands all current diagnoses, results, and treatment plans.  There are no new complaints, changes, or physical findings at this time.  All questions have been addressed and answered.  All medications, if any, that were given while in the emergency department or any that are being prescribed have been reviewed with the patient/family.  All side effects and adverse reactions have been explained.  Patient was instructed to, and agrees to follow-up with their primary care physician as well as return to the emergency department if any new or worsening symptoms develop.   Naaman Plummer, MD 10/30/21 0730

## 2021-10-30 NOTE — ED Notes (Signed)
340ml of clear yellow urine noted from [place] in cannister. Purewick removed. No complaints of shortness of breath. No signs of distress.

## 2021-11-03 ENCOUNTER — Other Ambulatory Visit
Admission: RE | Admit: 2021-11-03 | Discharge: 2021-11-03 | Disposition: A | Discharge status: Home / Self Care | Payer: PPO | Source: Ambulatory Visit | Attending: Family | Admitting: Family

## 2021-11-03 ENCOUNTER — Encounter: Payer: Self-pay | Admitting: Family

## 2021-11-03 ENCOUNTER — Telehealth: Payer: Self-pay | Admitting: Family

## 2021-11-03 ENCOUNTER — Other Ambulatory Visit: Payer: Self-pay

## 2021-11-03 ENCOUNTER — Ambulatory Visit (HOSPITAL_BASED_OUTPATIENT_CLINIC_OR_DEPARTMENT_OTHER): Payer: PPO | Admitting: Family

## 2021-11-03 VITALS — BP 150/76 | HR 67 | Resp 18 | Ht 65.0 in | Wt 147.0 lb

## 2021-11-03 DIAGNOSIS — I480 Paroxysmal atrial fibrillation: Secondary | ICD-10-CM

## 2021-11-03 DIAGNOSIS — I5022 Chronic systolic (congestive) heart failure: Secondary | ICD-10-CM | POA: Insufficient documentation

## 2021-11-03 DIAGNOSIS — I2510 Atherosclerotic heart disease of native coronary artery without angina pectoris: Secondary | ICD-10-CM | POA: Diagnosis not present

## 2021-11-03 DIAGNOSIS — I10 Essential (primary) hypertension: Secondary | ICD-10-CM

## 2021-11-03 LAB — BASIC METABOLIC PANEL
Anion gap: 9 (ref 5–15)
BUN: 24 mg/dL — ABNORMAL HIGH (ref 8–23)
CO2: 28 mmol/L (ref 22–32)
Calcium: 9.5 mg/dL (ref 8.9–10.3)
Chloride: 101 mmol/L (ref 98–111)
Creatinine, Ser: 1.22 mg/dL — ABNORMAL HIGH (ref 0.44–1.00)
GFR, Estimated: 46 mL/min — ABNORMAL LOW (ref >60)
Glucose, Bld: 131 mg/dL — ABNORMAL HIGH (ref 70–99)
Potassium: 4 mmol/L (ref 3.5–5.1)
Sodium: 138 mmol/L (ref 135–145)

## 2021-11-03 MED ORDER — EMPAGLIFLOZIN 10 MG PO TABS: 10.0000 mg | ORAL_TABLET | Freq: Every day | ORAL | 5 refills | Status: DC

## 2021-11-03 NOTE — Telephone Encounter (Signed)
Patient informed of results.  

## 2021-11-03 NOTE — Patient Instructions (Signed)
Begin Jardiance as 1 tablet every morning

## 2021-11-03 NOTE — Progress Notes (Signed)
Patient ID: Kelsey Carter, female    DOB: June 07, 1946, 75 y.o.   MRN: [id number]   HPI   Kelsey Carter is a 75 y/o female with a history of  breast cancer; CAD s/p stent; HTN; HLD; and afib on Eliquis.   She was admitted to the hospital from 10/14-18/18/22.  During her stay, she developed AKI secondary to diuresis. Renal ultrasound done. Renal CT negative for hydronephrosis. Cardiology and urology consults obtained.This resolved with hydration and she was discharged back home.    Recently went to the ED on 10/30/21. She was awoken with acute onset of SOB and chest pain. Went to ED, BNP 733, cxray consistent with vascular congestion. She was diuresed with IV lasix, d/c'd home with lasix 20 mg QD.   Echo report from 10/04/21 reviewed and showed an EF of 45-50% along with mild LVH and mild MR.    LHC on 07/27/2019 showed: Prox RCA lesion is 40% stenosed. Ost LAD to Prox LAD lesion is 75% stenosed. 1st Mrg lesion is 30% stenosed.   Angiography done 08/02/2019: 1st Mrg lesion is 30% stenosed. Ost LAD lesion is 40% stenosed. Mid LAD lesion is 50% stenosed. Prox LAD lesion is 75% stenosed. Post intervention, there is a 0% residual stenosis. A drug-eluting stent was successfully placed using a STENT RESOLUTE ONYX 2.5X12.    She presents today for a follow up visit following recent exacerbation of her heart failure with a chief complaint of mild fatigue with moderate exertion. She states she feels at her baseline and overall "great".  She denies any CP, SOB, palpitations, dizziness, presyncope, syncope, increased weight gain, pedal or abdominal edema.   She weighs herself daily and reports that it does not fluctuate more than 2 lbs in either direction. She also checks her BP daily, today it was 116/80 at home, in the office 150/76. She reports she has "white coat syndrome" and it is consistently elevated during her medical appointments.   Past Medical History:  Diagnosis Date   Anemia     vitamin d deficiency   Anxiety    Breast cancer ([place])    bilateral mastectomies.  different types of cancer in each breast   Cardiomyopathy due to chemotherapy [facility]) 2011   Chronic anticoagulation    Apixaban   Chronic pain 2019   Chronic SI joint pain    Coronary artery disease    DDD (degenerative disc disease), lumbar    First degree AV block    GERD (gastroesophageal reflux disease)    Hyperlipidemia    Hypertension    Insomnia    Lumbar herniated disc    Neuropathy    Paroxysmal atrial fibrillation ([facility])    Personal history of chemotherapy    Personal history of radiation therapy    S/P drug eluting coronary stent placement 08/02/2019   2.5 x 12 mm Resolute Onyx to pLAD   Valvular insufficiency    Mild   Past Surgical History:  Procedure Laterality Date   APPENDECTOMY  1961   BREAST IMPLANT REMOVAL Bilateral 2016   d/t infection. nothing replaced   CARDIAC CATHETERIZATION  2018   no blockages. pt thought it was an MI, but it wasn't   CESAREAN SECTION     x 3   CORONARY STENT INTERVENTION N/A 08/02/2019   Procedure: CORONARY STENT INTERVENTION;  Surgeon: Wellington Hampshire, MD;  Location: [place] [facility];  Service: Cardiovascular;  Laterality: N/A;   EYE SURGERY Bilateral 2013   cataract extractions with  iol. cornea replacement also   INTRAVASCULAR PRESSURE WIRE/FFR STUDY N/A 08/02/2019   Procedure: INTRAVASCULAR PRESSURE WIRE/FFR STUDY;  Surgeon: Wellington Hampshire, MD;  Location: [place] [facility];  Service: Cardiovascular;  Laterality: N/A;   KYPHOPLASTY N/A 06/17/2021   Procedure: L2 KYPHOPLASTY;  Surgeon: Hessie Knows, MD;  Location: [facility];  Service: Orthopedics;  Laterality: N/A;   LEFT HEART CATH AND CORONARY ANGIOGRAPHY Left 07/27/2019   Procedure: LEFT HEART CATH AND CORONARY ANGIOGRAPHY;  Surgeon: Corey Skains, MD;  Location: [place] [facility];  Service: Cardiovascular;  Laterality: Left;   LUMBAR LAMINECTOMY/DECOMPRESSION MICRODISCECTOMY N/A  06/29/2018   Procedure: LUMBAR LAMINECTOMY/DECOMPRESSION MICRODISCECTOMY 1 LEVEL-L4-5, L5-S1 LATERAL DISCECTOMY;  Surgeon: Meade Maw, MD;  Location: [facility];  Service: Neurosurgery;  Laterality: N/A;   MASTECTOMY Bilateral 2011, 2016   5 diff procedures. reconstruction saline removed d/t infx   nsvd     x 1   PLACEMENT OF BREAST IMPLANTS Bilateral 2016   PORT-A-CATH REMOVAL  2016   TONSILLECTOMY     Family History  Problem Relation Age of Onset   Heart failure Mother    Hypertension Mother    Cervical cancer Mother    Renal Disease Mother    Cancer Father    Tuberculosis Maternal Grandfather    Breast cancer Paternal Grandmother    Breast cancer Paternal Aunt    Social History   Tobacco Use   Smoking status: Former    Years: 3.00    Types: Cigarettes   Smokeless tobacco: Never  Substance Use Topics   Alcohol use: No   Allergies  Allergen Reactions   Hydrocodone-Acetaminophen Nausea And Vomiting    Severe vomitting   Morphine And Related Nausea And Vomiting    Pt preference    Ciprofloxacin Other (See Comments)    Joint pain    Gabapentin Other (See Comments)    Eyes blurry; vision decreased, dizziness    Prior to Admission medications   Medication Sig Start Date End Date Taking? Authorizing Provider  alendronate (FOSAMAX) 70 MG tablet Take 70 mg by mouth once a week. 08/21/21  Yes [provider]  amiodarone (PACERONE) 200 MG tablet Take 200 mg by mouth 2 (two) times daily. 04/17/21  Yes [provider]  atorvastatin (LIPITOR) 10 MG tablet Take 1 tablet (10 mg total) by mouth daily. 10/08/21 11/07/21 Yes Nolberto Hanlon, MD  Cholecalciferol (VITAMIN D3 PO) Take 1 tablet by mouth in the morning.   Yes [provider]  ELIQUIS 5 MG TABS tablet Take 5 mg by mouth 2 (two) times daily. 06/01/21  Yes [provider]  empagliflozin (JARDIANCE) 10 MG TABS tablet Take 1 tablet (10 mg total) by mouth daily before breakfast. 11/03/21  Yes  Darylene Price A, FNP  furosemide (LASIX) 20 MG tablet Take 1 tablet (20 mg total) by mouth daily. 10/30/21 10/30/22 Yes Vladimir Crofts, MD  levothyroxine (SYNTHROID) 25 MCG tablet Take 25 mcg by mouth in the morning. 06/11/21  Yes [provider]  MAGNESIUM PO Take 1 tablet by mouth in the morning.   Yes [provider]  meclizine (ANTIVERT) 12.5 MG tablet Take 12.5 mg by mouth 3 (three) times daily. 10/21/21  Yes [provider]  metoprolol succinate (TOPROL-XL) 25 MG 24 hr tablet Take 0.5 tablets (12.5 mg total) by mouth 2 (two) times daily. 10/07/21 11/06/21 Yes Nolberto Hanlon, MD  Potassium Citrate 99 MG CAPS Take 1 tablet by mouth daily.   Yes [provider]  Review of Systems  Constitutional:  Positive for malaise/fatigue.  HENT: Negative.    Eyes: Negative.  Negative for blurred vision and double vision.  Respiratory:  Negative for cough, sputum production, shortness of breath and wheezing.   Cardiovascular:  Negative for chest pain, palpitations, orthopnea and leg swelling.  Gastrointestinal: Negative.   Genitourinary: Negative.   Musculoskeletal:  Positive for back pain, falls and neck pain.  Skin: Negative.   Neurological:  Negative for dizziness, weakness and headaches.  Endo/Heme/Allergies: Negative.   Psychiatric/Behavioral: Negative.  The patient is not nervous/anxious.     Vitals with BMI 11/03/2021 10/30/2021 10/30/2021  Height 5\' 5"  - -  Weight 147 lbs - -  BMI 96.78 - -  Systolic 938 101 751  Diastolic 76 83 83  Pulse 67 64 67    Lab Results  Component Value Date   CREATININE 1.18 (H) 10/30/2021   CREATININE 1.34 (H) 10/07/2021   CREATININE 2.21 (H) 10/06/2021    Physical Exam Vitals and nursing note reviewed. Exam conducted with a chaperone present (husband).  Constitutional:      General: She is not in acute distress.    Appearance: Normal appearance.  Neck:     Vascular: No carotid bruit.  Cardiovascular:     Rate and  Rhythm: Normal rate and regular rhythm.     Pulses: Normal pulses.     Heart sounds: Normal heart sounds.  Pulmonary:     Effort: No respiratory distress.     Breath sounds: No wheezing or rales.  Abdominal:     General: There is no distension.     Palpations: Abdomen is soft.     Tenderness: There is no abdominal tenderness.  Musculoskeletal:        General: No swelling.     Cervical back: Normal range of motion.     Right lower leg: No edema.     Left lower leg: No edema.  Skin:    General: Skin is warm and dry.  Neurological:     General: No focal deficit present.     Mental Status: She is alert and oriented to person, place, and time.  Psychiatric:        Mood and Affect: Mood normal.        Thought Content: Thought content normal.        Judgment: Judgment normal.      Assessment & Plan:   1: Chronic heart failure with mildly reduced ejection fraction- - NYHA class II - euvolemic today  - BNP 10/30/21 was 733 - low sodium diet discussed, she states she adheres to low sodium - weighs daily - her HF symptoms typically present as chest fullness and she advises that she never has pedal or abdominal edema - reviewed to notify us or her cardiologist with a wt gain > 3 lbs/overnight or 5lbs/week - denies flu vaccine - on metoprolol succinate 12.5 mg BID, lasix 20 mg QD - will check BMP today - will start Jardiance 10 mg QD, provided coupon and assisted with filling out pt assistance forms   2: HTN- - BP 150/76, she checks daily at home and it was 116/80 at home this am - saw PCP on 10/31 - BMP 10/30/21 reviewed and showed sodium 135, potassium 3.9, creatinine 1.18 and GFR 48 - metoprolol succinate 12.5 mg BID   3: CAD- - stent placed in LAD in 2020 - Dr. Nehemiah Massed has recommended cardiac rehab, she is waiting for it to be set up  4: Paroxysmal atrial fibrillation- - will see Nehemiah Massed before the end of the year, could not remember actual appt time - RRR today - on  amiodarone and eliquis   Medication list reviewed.  Return in one month or sooner for worsening or new HF s/s.

## 2021-11-06 ENCOUNTER — Telehealth: Payer: Self-pay | Admitting: Family

## 2021-11-06 NOTE — Telephone Encounter (Signed)
Called patient to discuss her patient assistance application for Jardiance and that they are requesting proof of income. Patient stated she is uncomfortable with the medication an not to worry about it and she plans to switch back to her original medication and stop Jardiance.   Gal Smolinski, NT

## 2021-11-17 ENCOUNTER — Encounter: Payer: PPO | Attending: Internal Medicine

## 2021-11-17 ENCOUNTER — Other Ambulatory Visit: Payer: Self-pay

## 2021-11-17 DIAGNOSIS — Z20818 Contact with and (suspected) exposure to other bacterial communicable diseases: Secondary | ICD-10-CM | POA: Diagnosis not present

## 2021-11-17 DIAGNOSIS — I5032 Chronic diastolic (congestive) heart failure: Secondary | ICD-10-CM

## 2021-11-17 DIAGNOSIS — J029 Acute pharyngitis, unspecified: Secondary | ICD-10-CM | POA: Diagnosis not present

## 2021-11-17 NOTE — Progress Notes (Signed)
Virtual Visit completed. Patient informed on EP and RD appointment and 6 Minute walk test. Patient also informed of patient health questionnaires on My Chart. Patient Verbalizes understanding. Visit diagnosis can be found in [facility] 10/23/2021.

## 2021-11-27 ENCOUNTER — Encounter: Payer: PPO | Attending: Internal Medicine

## 2021-11-27 ENCOUNTER — Other Ambulatory Visit: Payer: Self-pay

## 2021-11-27 VITALS — Ht 63.2 in | Wt 145.0 lb

## 2021-11-27 DIAGNOSIS — I5032 Chronic diastolic (congestive) heart failure: Secondary | ICD-10-CM | POA: Diagnosis not present

## 2021-11-27 NOTE — Progress Notes (Signed)
Pulmonary Individual Treatment Plan  Patient Details  Name: Kelsey Carter MRN: [id number] Date of Birth: 23-Jul-1946 Referring Provider:   Flowsheet Row Pulmonary Rehab from 11/27/2021 in [facility] Cardiac and Pulmonary Rehab  Referring Provider Serafina Royals MD       Initial Encounter Date:  Flowsheet Row Pulmonary Rehab from 11/27/2021 in [facility] Cardiac and Pulmonary Rehab  Date 11/27/21       Visit Diagnosis: Heart failure, diastolic, chronic ([place])  Patient's Home Medications on Admission:  Current Outpatient Medications:    alendronate (FOSAMAX) 70 MG tablet, Take 70 mg by mouth once a week., Disp: , Rfl:    amiodarone (PACERONE) 200 MG tablet, Take 200 mg by mouth 2 (two) times daily., Disp: , Rfl:    amiodarone (PACERONE) 200 MG tablet, Take by mouth. (Patient not taking: Reported on 11/17/2021), Disp: , Rfl:    atorvastatin (LIPITOR) 10 MG tablet, Take 1 tablet (10 mg total) by mouth daily., Disp: 30 tablet, Rfl: 0   atorvastatin (LIPITOR) 10 MG tablet, Take 1 tablet by mouth daily., Disp: , Rfl:    cefdinir (OMNICEF) 300 MG capsule, Take 300 mg by mouth 2 (two) times daily., Disp: , Rfl:    Cholecalciferol (VITAMIN D3 PO), Take 1 tablet by mouth in the morning., Disp: , Rfl:    ELIQUIS 5 MG TABS tablet, Take 5 mg by mouth 2 (two) times daily., Disp: , Rfl:    empagliflozin (JARDIANCE) 10 MG TABS tablet, Take 1 tablet (10 mg total) by mouth daily before breakfast. (Patient not taking: Reported on 11/17/2021), Disp: 30 tablet, Rfl: 5   furosemide (LASIX) 20 MG tablet, Take 1 tablet (20 mg total) by mouth daily., Disp: 30 tablet, Rfl: 1   levothyroxine (SYNTHROID) 25 MCG tablet, Take 25 mcg by mouth in the morning., Disp: , Rfl:    MAGNESIUM PO, Take 1 tablet by mouth in the morning., Disp: , Rfl:    meclizine (ANTIVERT) 12.5 MG tablet, Take 12.5 mg by mouth 3 (three) times daily., Disp: , Rfl:    metoprolol succinate (TOPROL-XL) 25 MG 24 hr tablet, Take 0.5 tablets (12.5 mg  total) by mouth 2 (two) times daily., Disp: 30 tablet, Rfl: 0   Potassium Citrate 99 MG CAPS, Take 1 tablet by mouth daily., Disp: , Rfl:   Past Medical History: Past Medical History:  Diagnosis Date   Anemia    vitamin d deficiency   Anxiety    Breast cancer ([place])    bilateral mastectomies.  different types of cancer in each breast   Cardiomyopathy due to chemotherapy [facility]) 2011   Chronic anticoagulation    Apixaban   Chronic pain 2019   Chronic SI joint pain    Coronary artery disease    DDD (degenerative disc disease), lumbar    First degree AV block    GERD (gastroesophageal reflux disease)    Hyperlipidemia    Hypertension    Insomnia    Lumbar herniated disc    Neuropathy    Paroxysmal atrial fibrillation (HCC)    Personal history of chemotherapy    Personal history of radiation therapy    S/P drug eluting coronary stent placement 08/02/2019   2.5 x 12 mm Resolute Onyx to pLAD   Valvular insufficiency    Mild    Tobacco Use: Social History   Tobacco Use  Smoking Status Former   Packs/day: 0.25   Years: 3.00   Pack years: 0.75   Types: Cigarettes   Quit date: 10/21/1965  Years since quitting: 56.1  Smokeless Tobacco Never    Labs: Recent Review Flowsheet Data     Labs for ITP Cardiac and Pulmonary Rehab Latest Ref Rng & Units 04/24/2013 08/24/2017 03/22/2018 09/21/2018   Cholestrol 0 - 200 mg/dL 160 117 - 130   LDLCALC 0 - 99 mg/dL 87 48 - 59   HDL >40 mg/dL 54 53 - 49   Trlycerides <150 mg/dL 97 82 - 108   Hemoglobin A1c 4.8 - 5.6 % - - 5.9(H) 5.6        Pulmonary Assessment Scores:  Pulmonary Assessment Scores     Row Name 11/27/21 1112         ADL UCSD   ADL Phase Entry     SOB Score total 14     Rest 0     Walk 4     Stairs 4     Bath 0     Dress 0     Shop 1       CAT Score   CAT Score 3       mMRC Score   mMRC Score 1              UCSD: Self-administered rating of dyspnea associated with activities of daily living  (ADLs) 6-point scale (0 = "not at all" to 5 = "maximal or unable to do because of breathlessness")  Scoring Scores range from 0 to 120.  Minimally important difference is 5 units  CAT: CAT can identify the health impairment of COPD patients and is better correlated with disease progression.  CAT has a scoring range of zero to 40. The CAT score is classified into four groups of low (less than 10), medium (10 - 20), high (21-30) and very high (31-40) based on the impact level of disease on health status. A CAT score over 10 suggests significant symptoms.  A worsening CAT score could be explained by an exacerbation, poor medication adherence, poor inhaler technique, or progression of COPD or comorbid conditions.  CAT MCID is 2 points  mMRC: mMRC (Modified Medical Research Council) Dyspnea Scale is used to assess the degree of baseline functional disability in patients of respiratory disease due to dyspnea. No minimal important difference is established. A decrease in score of 1 point or greater is considered a positive change.   Pulmonary Function Assessment:  Pulmonary Function Assessment - 11/17/21 1551       Breath   Shortness of Breath Yes;Limiting activity             Exercise Target Goals: Exercise Program Goal: Individual exercise prescription set using results from initial 6 min walk test and THRR while considering  patient's activity barriers and safety.   Exercise Prescription Goal: Initial exercise prescription builds to 30-45 minutes a day of aerobic activity, 2-3 days per week.  Home exercise guidelines will be given to patient during program as part of exercise prescription that the participant will acknowledge.  Education: Aerobic Exercise: - Group verbal and visual presentation on the components of exercise prescription. Introduces F.I.T.T principle from ACSM for exercise prescriptions.  Reviews F.I.T.T. principles of aerobic exercise including progression. Written  material given at graduation. Flowsheet Row Pulmonary Rehab from 11/27/2021 in [facility] Cardiac and Pulmonary Rehab  Education need identified 11/27/21       Education: Resistance Exercise: - Group verbal and visual presentation on the components of exercise prescription. Introduces F.I.T.T principle from ACSM for exercise prescriptions  Reviews F.I.T.T. principles of resistance exercise  including progression. Written material given at graduation.    Education: Exercise & Equipment Safety: - Individual verbal instruction and demonstration of equipment use and safety with use of the equipment. Flowsheet Row Pulmonary Rehab from 11/17/2021 in [facility] Cardiac and Pulmonary Rehab  Date 11/17/21  Educator [facility]  Instruction Review Code 1- Verbalizes Understanding       Education: Exercise Physiology & General Exercise Guidelines: - Group verbal and written instruction with models to review the exercise physiology of the cardiovascular system and associated critical values. Provides general exercise guidelines with specific guidelines to those with heart or lung disease.    Education: Flexibility, Balance, Mind/Body Relaxation: - Group verbal and visual presentation with interactive activity on the components of exercise prescription. Introduces F.I.T.T principle from ACSM for exercise prescriptions. Reviews F.I.T.T. principles of flexibility and balance exercise training including progression. Also discusses the mind body connection.  Reviews various relaxation techniques to help reduce and manage stress (i.e. Deep breathing, progressive muscle relaxation, and visualization). Balance handout provided to take home. Written material given at graduation.   Activity Barriers & Risk Stratification:  Activity Barriers & Cardiac Risk Stratification - 11/27/21 1106       Activity Barriers & Cardiac Risk Stratification   Activity Barriers Balance Concerns;Muscular Weakness;Deconditioning;History of  Falls;Other (comment)    Comments Neuropathy bilateral legs             6 Minute Walk:  6 Minute Walk     Row Name 11/27/21 1118         6 Minute Walk   Phase Initial     Distance 1105 feet     Walk Time 6 minutes     # of Rest Breaks 0     MPH 2.09     METS 2.13     RPE 11     Perceived Dyspnea  0     VO2 Peak 7.45     Symptoms No     Resting HR 65 bpm     Resting BP 124/68     Resting Oxygen Saturation  97 %     Exercise Oxygen Saturation  during 6 min walk 95 %     Max Ex. HR 86 bpm     Max Ex. BP 132/70     2 Minute Post BP 130/68       Interval HR   1 Minute HR 77     2 Minute HR 82     3 Minute HR 85     4 Minute HR 86     5 Minute HR 85     6 Minute HR 83     2 Minute Post HR 66     Interval Heart Rate? Yes       Interval Oxygen   Interval Oxygen? Yes     Baseline Oxygen Saturation % 97 %     1 Minute Oxygen Saturation % 97 %     1 Minute Liters of Oxygen 0 L  RA     2 Minute Oxygen Saturation % 96 %     2 Minute Liters of Oxygen 0 L     3 Minute Oxygen Saturation % 95 %     3 Minute Liters of Oxygen 0 L     4 Minute Oxygen Saturation % 97 %     4 Minute Liters of Oxygen 0 L     5 Minute Oxygen Saturation % 97 %     5 Minute  Liters of Oxygen 0 L     6 Minute Oxygen Saturation % 95 %     6 Minute Liters of Oxygen 0 L     2 Minute Post Oxygen Saturation % 98 %     2 Minute Post Liters of Oxygen 0 L             Oxygen Initial Assessment:  Oxygen Initial Assessment - 11/27/21 1112       Home Oxygen   Home Oxygen Device None    Sleep Oxygen Prescription None    Home Exercise Oxygen Prescription None    Home Resting Oxygen Prescription None    Compliance with Home Oxygen Use Yes      Initial 6 min Walk   Oxygen Used None      Program Oxygen Prescription   Program Oxygen Prescription None      Intervention   Short Term Goals To learn and understand importance of monitoring SPO2 with pulse oximeter and demonstrate accurate use of  the pulse oximeter.;To learn and understand importance of maintaining oxygen saturations>88%;To learn and demonstrate proper pursed lip breathing techniques or other breathing techniques. ;To learn and exhibit compliance with exercise, home and travel O2 prescription    Long  Term Goals Exhibits compliance with exercise, home  and travel O2 prescription;Verbalizes importance of monitoring SPO2 with pulse oximeter and return demonstration;Maintenance of O2 saturations>88%;Exhibits proper breathing techniques, such as pursed lip breathing or other method taught during program session;Compliance with respiratory medication             Oxygen Re-Evaluation:   Oxygen Discharge (Final Oxygen Re-Evaluation):   Initial Exercise Prescription:  Initial Exercise Prescription - 11/27/21 1100       Date of Initial Exercise RX and Referring Provider   Date 11/27/21    Referring Provider Serafina Royals MD      Oxygen   Maintain Oxygen Saturation 88% or higher      Recumbant Bike   Level 1    RPM 60    Watts 15    Minutes 15    METs 2.1      NuStep   Level 2    SPM 80    Minutes 15    METs 2.1      REL-XR   Level 1    Speed 50    Minutes 15    METs 2.1      Track   Laps 21    Minutes 15    METs 2.14      Prescription Details   Frequency (times per week) 2    Duration Progress to 30 minutes of continuous aerobic without signs/symptoms of physical distress      Intensity   THRR 40-80% of Max Heartrate 96-128    Ratings of Perceived Exertion 11-13    Perceived Dyspnea 0-4      Progression   Progression Continue to progress workloads to maintain intensity without signs/symptoms of physical distress.      Resistance Training   Training Prescription Yes    Weight 3 lb    Reps 10-15             Perform Capillary Blood Glucose checks as needed.  Exercise Prescription Changes:   Exercise Prescription Changes     Row Name 11/27/21 1100             Response  to Exercise   Blood Pressure (Admit) 124/68       Blood Pressure (  Exercise) 132/70       Blood Pressure (Exit) 130/68       Heart Rate (Admit) 65 bpm       Heart Rate (Exercise) 86 bpm       Heart Rate (Exit) 66 bpm       Oxygen Saturation (Admit) 97 %       Oxygen Saturation (Exercise) 95 %       Oxygen Saturation (Exit) 98 %       Rating of Perceived Exertion (Exercise) 11       Perceived Dyspnea (Exercise) 0       Symptoms none       Comments walk test results                Exercise Comments:   Exercise Goals and Review:   Exercise Goals     Row Name 11/27/21 1138             Exercise Goals   Increase Physical Activity Yes       Intervention Provide advice, education, support and counseling about physical activity/exercise needs.;Develop an individualized exercise prescription for aerobic and resistive training based on initial evaluation findings, risk stratification, comorbidities and participant's personal goals.       Expected Outcomes Short Term: Attend rehab on a regular basis to increase amount of physical activity.;Long Term: Add in home exercise to make exercise part of routine and to increase amount of physical activity.;Long Term: Exercising regularly at least 3-5 days a week.       Increase Strength and Stamina Yes       Intervention Provide advice, education, support and counseling about physical activity/exercise needs.;Develop an individualized exercise prescription for aerobic and resistive training based on initial evaluation findings, risk stratification, comorbidities and participant's personal goals.       Expected Outcomes Short Term: Increase workloads from initial exercise prescription for resistance, speed, and METs.;Short Term: Perform resistance training exercises routinely during rehab and add in resistance training at home;Long Term: Improve cardiorespiratory fitness, muscular endurance and strength as measured by increased METs and functional  capacity (6MWT)       Able to understand and use rate of perceived exertion (RPE) scale Yes       Intervention Provide education and explanation on how to use RPE scale       Expected Outcomes Short Term: Able to use RPE daily in rehab to express subjective intensity level;Long Term:  Able to use RPE to guide intensity level when exercising independently       Able to understand and use Dyspnea scale Yes       Intervention Provide education and explanation on how to use Dyspnea scale       Expected Outcomes Short Term: Able to use Dyspnea scale daily in rehab to express subjective sense of shortness of breath during exertion;Long Term: Able to use Dyspnea scale to guide intensity level when exercising independently       Knowledge and understanding of Target Heart Rate Range (THRR) Yes       Intervention Provide education and explanation of THRR including how the numbers were predicted and where they are located for reference       Expected Outcomes Short Term: Able to state/look up THRR;Short Term: Able to use daily as guideline for intensity in rehab;Long Term: Able to use THRR to govern intensity when exercising independently       Able to check pulse independently Yes  Intervention Provide education and demonstration on how to check pulse in carotid and radial arteries.;Review the importance of being able to check your own pulse for safety during independent exercise       Expected Outcomes Short Term: Able to explain why pulse checking is important during independent exercise;Long Term: Able to check pulse independently and accurately       Understanding of Exercise Prescription Yes       Intervention Provide education, explanation, and written materials on patient's individual exercise prescription       Expected Outcomes Short Term: Able to explain program exercise prescription;Long Term: Able to explain home exercise prescription to exercise independently                Exercise  Goals Re-Evaluation :   Discharge Exercise Prescription (Final Exercise Prescription Changes):  Exercise Prescription Changes - 11/27/21 1100       Response to Exercise   Blood Pressure (Admit) 124/68    Blood Pressure (Exercise) 132/70    Blood Pressure (Exit) 130/68    Heart Rate (Admit) 65 bpm    Heart Rate (Exercise) 86 bpm    Heart Rate (Exit) 66 bpm    Oxygen Saturation (Admit) 97 %    Oxygen Saturation (Exercise) 95 %    Oxygen Saturation (Exit) 98 %    Rating of Perceived Exertion (Exercise) 11    Perceived Dyspnea (Exercise) 0    Symptoms none    Comments walk test results             Nutrition:  Target Goals: Understanding of nutrition guidelines, daily intake of sodium 1500mg , cholesterol 200mg , calories 30% from fat and 7% or less from saturated fats, daily to have 5 or more servings of fruits and vegetables.  Education: All About Nutrition: -Group instruction provided by verbal, written material, interactive activities, discussions, models, and posters to present general guidelines for heart healthy nutrition including fat, fiber, MyPlate, the role of sodium in heart healthy nutrition, utilization of the nutrition label, and utilization of this knowledge for meal planning. Follow up email sent as well. Written material given at graduation. Flowsheet Row Pulmonary Rehab from 11/27/2021 in [facility] Cardiac and Pulmonary Rehab  Education need identified 11/27/21       Biometrics:  Pre Biometrics - 11/27/21 1139       Pre Biometrics   Height 5' 3.2" (1.605 m)    Weight 145 lb (65.8 kg)    BMI (Calculated) 25.53    Single Leg Stand 1.83 seconds              Nutrition Therapy Plan and Nutrition Goals:  Nutrition Therapy & Goals - 11/27/21 1115       Intervention Plan   Intervention Prescribe, educate and counsel regarding individualized specific dietary modifications aiming towards targeted core components such as weight, hypertension, lipid  management, diabetes, heart failure and other comorbidities.    Expected Outcomes Short Term Goal: Understand basic principles of dietary content, such as calories, fat, sodium, cholesterol and nutrients.;Short Term Goal: A plan has been developed with personal nutrition goals set during dietitian appointment.;Long Term Goal: Adherence to prescribed nutrition plan.             Nutrition Assessments:  MEDIFICTS Score Key: ?70 Need to make dietary changes  40-70 Heart Healthy Diet ? 40 Therapeutic Level Cholesterol Diet  Flowsheet Row Pulmonary Rehab from 11/27/2021 in [facility] Cardiac and Pulmonary Rehab  Picture Your Plate Total Score on Admission 78  Picture Your Plate Scores: <63 Unhealthy dietary pattern with much room for improvement. 41-50 Dietary pattern unlikely to meet recommendations for good health and room for improvement. 51-60 More healthful dietary pattern, with some room for improvement.  >60 Healthy dietary pattern, although there may be some specific behaviors that could be improved.   Nutrition Goals Re-Evaluation:   Nutrition Goals Discharge (Final Nutrition Goals Re-Evaluation):   Psychosocial: Target Goals: Acknowledge presence or absence of significant depression and/or stress, maximize coping skills, provide positive support system. Participant is able to verbalize types and ability to use techniques and skills needed for reducing stress and depression.   Education: Stress, Anxiety, and Depression - Group verbal and visual presentation to define topics covered.  Reviews how body is impacted by stress, anxiety, and depression.  Also discusses healthy ways to reduce stress and to treat/manage anxiety and depression.  Written material given at graduation.   Education: Sleep Hygiene -Provides group verbal and written instruction about how sleep can affect your health.  Define sleep hygiene, discuss sleep cycles and impact of sleep habits. Review good sleep  hygiene tips.    Initial Review & Psychosocial Screening:  Initial Psych Review & Screening - 11/17/21 1553       Initial Review   Current issues with None Identified      Family Dynamics   Good Support System? Yes    Comments She can look to her husband, son and daughter for support. She has a positive outlook on her health. She has done the program once before and is excited to do it again.      Barriers   Psychosocial barriers to participate in program There are no identifiable barriers or psychosocial needs.;The patient should benefit from training in stress management and relaxation.      Screening Interventions   Interventions Encouraged to exercise;To provide support and resources with identified psychosocial needs;Provide feedback about the scores to participant    Expected Outcomes Short Term goal: Utilizing psychosocial counselor, staff and physician to assist with identification of specific Stressors or current issues interfering with healing process. Setting desired goal for each stressor or current issue identified.;Long Term Goal: Stressors or current issues are controlled or eliminated.;Short Term goal: Identification and review with participant of any Quality of Life or Depression concerns found by scoring the questionnaire.;Long Term goal: The participant improves quality of Life and PHQ9 Scores as seen by post scores and/or verbalization of changes             Quality of Life Scores:  Scores of 19 and below usually indicate a poorer quality of life in these areas.  A difference of  2-3 points is a clinically meaningful difference.  A difference of 2-3 points in the total score of the Quality of Life Index has been associated with significant improvement in overall quality of life, self-image, physical symptoms, and general health in studies assessing change in quality of life.  PHQ-9: Recent Review Flowsheet Data     Depression screen [facility] 2/9 11/27/2021 01/29/2020  08/24/2019 09/21/2018 03/31/2018   Decreased Interest 0 0 0 0 0   Down, Depressed, Hopeless 0 0 0 0 0   PHQ - 2 Score 0 0 0 0 0   Altered sleeping 0 0 0 0 0   Tired, decreased energy 1 0 1 0 0   Change in appetite 0 0 0 0 0   Feeling bad or failure about yourself  0 0 0 0 0  Trouble concentrating 0 0 0 0 0   Moving slowly or fidgety/restless 0 0 0 0 0   Suicidal thoughts 0 0 0 0 0   PHQ-9 Score 1 0 1 0 0   Difficult doing work/chores Not difficult at all - Not difficult at all - Not difficult at all      Interpretation of Total Score  Total Score Depression Severity:  1-4 = Minimal depression, 5-9 = Mild depression, 10-14 = Moderate depression, 15-19 = Moderately severe depression, 20-27 = Severe depression   Psychosocial Evaluation and Intervention:  Psychosocial Evaluation - 11/17/21 1554       Psychosocial Evaluation & Interventions   Interventions Encouraged to exercise with the program and follow exercise prescription;Stress management education;Relaxation education    Comments She can look to her husband, son and daughter for support. She has a positive outlook on her health. She has done the program once before and is excited to do it again.    Expected Outcomes Short: Start [facility] to help with mood. Long: Maintain a healthy mental state.    Continue Psychosocial Services  Follow up required by staff             Psychosocial Re-Evaluation:   Psychosocial Discharge (Final Psychosocial Re-Evaluation):   Education: Education Goals: Education classes will be provided on a weekly basis, covering required topics. Participant will state understanding/return demonstration of topics presented.  Learning Barriers/Preferences:  Learning Barriers/Preferences - 11/17/21 1552       Learning Barriers/Preferences   Learning Barriers None    Learning Preferences None             General Pulmonary Education Topics:  Infection Prevention: - Provides verbal and  written material to individual with discussion of infection control including proper hand washing and proper equipment cleaning during exercise session. Flowsheet Row Pulmonary Rehab from 11/17/2021 in [facility] Cardiac and Pulmonary Rehab  Date 11/17/21  Educator [facility]  Instruction Review Code 1- Verbalizes Understanding       Falls Prevention: - Provides verbal and written material to individual with discussion of falls prevention and safety. Flowsheet Row Pulmonary Rehab from 11/17/2021 in [facility] Cardiac and Pulmonary Rehab  Date 11/17/21  Educator [facility]  Instruction Review Code 1- Verbalizes Understanding       Chronic Lung Disease Review: - Group verbal instruction with posters, models, PowerPoint presentations and videos,  to review new updates, new respiratory medications, new advancements in procedures and treatments. Providing information on websites and "800" numbers for continued self-education. Includes information about supplement oxygen, available portable oxygen systems, continuous and intermittent flow rates, oxygen safety, concentrators, and Medicare reimbursement for oxygen. Explanation of Pulmonary Drugs, including class, frequency, complications, importance of spacers, rinsing mouth after steroid MDI's, and proper cleaning methods for nebulizers. Review of basic lung anatomy and physiology related to function, structure, and complications of lung disease. Review of risk factors. Discussion about methods for diagnosing sleep apnea and types of masks and machines for OSA. Includes a review of the use of types of environmental controls: home humidity, furnaces, filters, dust mite/pet prevention, HEPA vacuums. Discussion about weather changes, air quality and the benefits of nasal washing. Instruction on Warning signs, infection symptoms, calling MD promptly, preventive modes, and value of vaccinations. Review of effective airway clearance, coughing and/or vibration techniques. Emphasizing  that all should Create an Action Plan. Written material given at graduation.   AED/CPR: - Group verbal and written instruction with the use of models to demonstrate the basic use  of the AED with the basic ABC's of resuscitation.    Anatomy and Cardiac Procedures: - Group verbal and visual presentation and models provide information about basic cardiac anatomy and function. Reviews the testing methods done to diagnose heart disease and the outcomes of the test results. Describes the treatment choices: Medical Management, Angioplasty, or Coronary Bypass Surgery for treating various heart conditions including Myocardial Infarction, Angina, Valve Disease, and Cardiac Arrhythmias.  Written material given at graduation. Flowsheet Row Pulmonary Rehab from 11/27/2021 in [facility]  Education need identified 11/27/21       Medication Safety: - Group verbal and visual instruction to review commonly prescribed medications for heart and lung disease. Reviews the medication, class of the drug, and side effects. Includes the steps to properly store meds and maintain the prescription regimen.  Written material given at graduation.   Other: -Provides group and verbal instruction on various topics (see comments)   Knowledge Questionnaire Score:  Knowledge Questionnaire Score - 11/27/21 1112       Knowledge Questionnaire Score   Pre Score 23/26: Angina, Nutrition, Exercise              Core Components/Risk Factors/Patient Goals at Admission:  Personal Goals and Risk Factors at Admission - 11/27/21 1140       Core Components/Risk Factors/Patient Goals on Admission    Weight Management Yes;Weight Maintenance   Patient already lost weight and wants to maintain   Intervention Weight Management: Develop a combined nutrition and exercise program designed to reach desired caloric intake, while maintaining appropriate intake of nutrient and fiber, sodium and fats, and  appropriate energy expenditure required for the weight goal.;Weight Management: Provide education and appropriate resources to help participant work on and attain dietary goals.;Weight Management/Obesity: Establish reasonable short term and long term weight goals.    Admit Weight 145 lb (65.8 kg)    Goal Weight: Short Term 145 lb (65.8 kg)    Goal Weight: Long Term 145 lb (65.8 kg)    Expected Outcomes Short Term: Continue to assess and modify interventions until short term weight is achieved;Long Term: Adherence to nutrition and physical activity/exercise program aimed toward attainment of established weight goal;Understanding recommendations for meals to include 15-35% energy as protein, 25-35% energy from fat, 35-60% energy from carbohydrates, less than 200mg  of dietary cholesterol, 20-35 gm of total fiber daily;Understanding of distribution of calorie intake throughout the day with the consumption of 4-5 meals/snacks;Weight Maintenance: Understanding of the daily nutrition guidelines, which includes 25-35% calories from fat, 7% or less cal from saturated fats, less than 200mg  cholesterol, less than 1.5gm of sodium, & 5 or more servings of fruits and vegetables daily    Improve shortness of breath with ADL's Yes    Intervention Provide education, individualized exercise plan and daily activity instruction to help decrease symptoms of SOB with activities of daily living.    Expected Outcomes Short Term: Improve cardiorespiratory fitness to achieve a reduction of symptoms when performing ADLs;Long Term: Be able to perform more ADLs without symptoms or delay the onset of symptoms    Increase knowledge of respiratory medications and ability to use respiratory devices properly  Yes    Intervention Provide education and demonstration as needed of appropriate use of medications, inhalers, and oxygen therapy.    Expected Outcomes Short Term: Achieves understanding of medications use. Understands that oxygen is  a medication prescribed by physician. Demonstrates appropriate use of inhaler and oxygen therapy.;Long Term: Maintain appropriate use of medications,  inhalers, and oxygen therapy.    Hypertension Yes    Intervention Provide education on lifestyle modifcations including regular physical activity/exercise, weight management, moderate sodium restriction and increased consumption of fresh fruit, vegetables, and low fat dairy, alcohol moderation, and smoking cessation.;Monitor prescription use compliance.    Expected Outcomes Short Term: Continued assessment and intervention until BP is < 140/71mm HG in hypertensive participants. < 130/73mm HG in hypertensive participants with diabetes, heart failure or chronic kidney disease.;Long Term: Maintenance of blood pressure at goal levels.    Lipids Yes    Intervention Provide education and support for participant on nutrition & aerobic/resistive exercise along with prescribed medications to achieve LDL 70mg , HDL >40mg .    Expected Outcomes Short Term: Participant states understanding of desired cholesterol values and is compliant with medications prescribed. Participant is following exercise prescription and nutrition guidelines.;Long Term: Cholesterol controlled with medications as prescribed, with individualized exercise RX and with personalized nutrition plan. Value goals: LDL < 70mg , HDL > 40 mg.             Education:Diabetes - Individual verbal and written instruction to review signs/symptoms of diabetes, desired ranges of glucose level fasting, after meals and with exercise. Acknowledge that pre and post exercise glucose checks will be done for 3 sessions at entry of program.   Know Your Numbers and Heart Failure: - Group verbal and visual instruction to discuss disease risk factors for cardiac and pulmonary disease and treatment options.  Reviews associated critical values for Overweight/Obesity, Hypertension, Cholesterol, and Diabetes.  Discusses  basics of heart failure: signs/symptoms and treatments.  Introduces Heart Failure Zone chart for action plan for heart failure.  Written material given at graduation.   Core Components/Risk Factors/Patient Goals Review:    Core Components/Risk Factors/Patient Goals at Discharge (Final Review):    ITP Comments:  ITP Comments     Row Name 11/17/21 1550 11/27/21 1058         ITP Comments Virtual Visit completed. Patient informed on EP and RD appointment and 6 Minute walk test. Patient also informed of patient health questionnaires on My Chart. Patient Verbalizes understanding. Visit diagnosis can be found in [facility] 10/23/2021. Completed 6MWT and gym orientation. Initial ITP created and sent for review to Dr. Ottie Glazier , Medical Director.               Comments: Initial ITP

## 2021-11-27 NOTE — Patient Instructions (Signed)
Patient Instructions  Patient Details  Name: Kelsey Carter MRN: [id number] Date of Birth: 18-Sep-1946 Referring Provider:  Corey Skains, MD  Below are your personal goals for exercise, nutrition, and risk factors. Our goal is to help you stay on track towards obtaining and maintaining these goals. We will be discussing your progress on these goals with you throughout the program.  Initial Exercise Prescription:  Initial Exercise Prescription - 11/27/21 1100       Date of Initial Exercise RX and Referring Provider   Date 11/27/21    Referring Provider Serafina Royals MD      Oxygen   Maintain Oxygen Saturation 88% or higher      Recumbant Bike   Level 1    RPM 60    Watts 15    Minutes 15    METs 2.1      NuStep   Level 2    SPM 80    Minutes 15    METs 2.1      REL-XR   Level 1    Speed 50    Minutes 15    METs 2.1      Track   Laps 21    Minutes 15    METs 2.14      Prescription Details   Frequency (times per week) 2    Duration Progress to 30 minutes of continuous aerobic without signs/symptoms of physical distress      Intensity   THRR 40-80% of Max Heartrate 96-128    Ratings of Perceived Exertion 11-13    Perceived Dyspnea 0-4      Progression   Progression Continue to progress workloads to maintain intensity without signs/symptoms of physical distress.      Resistance Training   Training Prescription Yes    Weight 3 lb    Reps 10-15             Exercise Goals: Frequency: Be able to perform aerobic exercise two to three times per week in program working toward 2-5 days per week of home exercise.  Intensity: Work with a perceived exertion of 11 (fairly light) - 15 (hard) while following your exercise prescription.  We will make changes to your prescription with you as you progress through the program.   Duration: Be able to do 30 to 45 minutes of continuous aerobic exercise in addition to a 5 minute warm-up and a 5 minute cool-down  routine.   Nutrition Goals: Your personal nutrition goals will be established when you do your nutrition analysis with the dietician.  The following are general nutrition guidelines to follow: Cholesterol < 200mg /day Sodium < 1500mg /day Fiber: Women over 50 yrs - 21 grams per day  Personal Goals:  Personal Goals and Risk Factors at Admission - 11/27/21 1140       Core Components/Risk Factors/Patient Goals on Admission    Weight Management Yes;Weight Maintenance   Patient already lost weight and wants to maintain   Intervention Weight Management: Develop a combined nutrition and exercise program designed to reach desired caloric intake, while maintaining appropriate intake of nutrient and fiber, sodium and fats, and appropriate energy expenditure required for the weight goal.;Weight Management: Provide education and appropriate resources to help participant work on and attain dietary goals.;Weight Management/Obesity: Establish reasonable short term and long term weight goals.    Admit Weight 145 lb (65.8 kg)    Goal Weight: Short Term 145 lb (65.8 kg)    Goal Weight: Long Term 145 lb (  65.8 kg)    Expected Outcomes Short Term: Continue to assess and modify interventions until short term weight is achieved;Long Term: Adherence to nutrition and physical activity/exercise program aimed toward attainment of established weight goal;Understanding recommendations for meals to include 15-35% energy as protein, 25-35% energy from fat, 35-60% energy from carbohydrates, less than 200mg  of dietary cholesterol, 20-35 gm of total fiber daily;Understanding of distribution of calorie intake throughout the day with the consumption of 4-5 meals/snacks;Weight Maintenance: Understanding of the daily nutrition guidelines, which includes 25-35% calories from fat, 7% or less cal from saturated fats, less than 200mg  cholesterol, less than 1.5gm of sodium, & 5 or more servings of fruits and vegetables daily    Improve  shortness of breath with ADL's Yes    Intervention Provide education, individualized exercise plan and daily activity instruction to help decrease symptoms of SOB with activities of daily living.    Expected Outcomes Short Term: Improve cardiorespiratory fitness to achieve a reduction of symptoms when performing ADLs;Long Term: Be able to perform more ADLs without symptoms or delay the onset of symptoms    Increase knowledge of respiratory medications and ability to use respiratory devices properly  Yes    Intervention Provide education and demonstration as needed of appropriate use of medications, inhalers, and oxygen therapy.    Expected Outcomes Short Term: Achieves understanding of medications use. Understands that oxygen is a medication prescribed by physician. Demonstrates appropriate use of inhaler and oxygen therapy.;Long Term: Maintain appropriate use of medications, inhalers, and oxygen therapy.    Hypertension Yes    Intervention Provide education on lifestyle modifcations including regular physical activity/exercise, weight management, moderate sodium restriction and increased consumption of fresh fruit, vegetables, and low fat dairy, alcohol moderation, and smoking cessation.;Monitor prescription use compliance.    Expected Outcomes Short Term: Continued assessment and intervention until BP is < 140/14mm HG in hypertensive participants. < 130/6mm HG in hypertensive participants with diabetes, heart failure or chronic kidney disease.;Long Term: Maintenance of blood pressure at goal levels.    Lipids Yes    Intervention Provide education and support for participant on nutrition & aerobic/resistive exercise along with prescribed medications to achieve LDL 70mg , HDL >40mg .    Expected Outcomes Short Term: Participant states understanding of desired cholesterol values and is compliant with medications prescribed. Participant is following exercise prescription and nutrition guidelines.;Long Term:  Cholesterol controlled with medications as prescribed, with individualized exercise RX and with personalized nutrition plan. Value goals: LDL < 70mg , HDL > 40 mg.             Tobacco Use Initial Evaluation: Social History   Tobacco Use  Smoking Status Former   Packs/day: 0.25   Years: 3.00   Pack years: 0.75   Types: Cigarettes   Quit date: 10/21/1965   Years since quitting: 56.1  Smokeless Tobacco Never    Exercise Goals and Review:  Exercise Goals     Row Name 11/27/21 1138             Exercise Goals   Increase Physical Activity Yes       Intervention Provide advice, education, support and counseling about physical activity/exercise needs.;Develop an individualized exercise prescription for aerobic and resistive training based on initial evaluation findings, risk stratification, comorbidities and participant's personal goals.       Expected Outcomes Short Term: Attend rehab on a regular basis to increase amount of physical activity.;Long Term: Add in home exercise to make exercise part of routine and to increase  amount of physical activity.;Long Term: Exercising regularly at least 3-5 days a week.       Increase Strength and Stamina Yes       Intervention Provide advice, education, support and counseling about physical activity/exercise needs.;Develop an individualized exercise prescription for aerobic and resistive training based on initial evaluation findings, risk stratification, comorbidities and participant's personal goals.       Expected Outcomes Short Term: Increase workloads from initial exercise prescription for resistance, speed, and METs.;Short Term: Perform resistance training exercises routinely during rehab and add in resistance training at home;Long Term: Improve cardiorespiratory fitness, muscular endurance and strength as measured by increased METs and functional capacity (6MWT)       Able to understand and use rate of perceived exertion (RPE) scale Yes        Intervention Provide education and explanation on how to use RPE scale       Expected Outcomes Short Term: Able to use RPE daily in rehab to express subjective intensity level;Long Term:  Able to use RPE to guide intensity level when exercising independently       Able to understand and use Dyspnea scale Yes       Intervention Provide education and explanation on how to use Dyspnea scale       Expected Outcomes Short Term: Able to use Dyspnea scale daily in rehab to express subjective sense of shortness of breath during exertion;Long Term: Able to use Dyspnea scale to guide intensity level when exercising independently       Knowledge and understanding of Target Heart Rate Range (THRR) Yes       Intervention Provide education and explanation of THRR including how the numbers were predicted and where they are located for reference       Expected Outcomes Short Term: Able to state/look up THRR;Short Term: Able to use daily as guideline for intensity in rehab;Long Term: Able to use THRR to govern intensity when exercising independently       Able to check pulse independently Yes       Intervention Provide education and demonstration on how to check pulse in carotid and radial arteries.;Review the importance of being able to check your own pulse for safety during independent exercise       Expected Outcomes Short Term: Able to explain why pulse checking is important during independent exercise;Long Term: Able to check pulse independently and accurately       Understanding of Exercise Prescription Yes       Intervention Provide education, explanation, and written materials on patient's individual exercise prescription       Expected Outcomes Short Term: Able to explain program exercise prescription;Long Term: Able to explain home exercise prescription to exercise independently                Copy of goals given to participant.

## 2021-11-30 NOTE — Progress Notes (Signed)
Patient ID: Kelsey Carter, female    DOB: February 28, 1946, 75 y.o.   MRN: [id number]   HPI   Kelsey Carter is a 75 y/o female with a history of  breast cancer; CAD s/p stent; HTN; HLD; and afib on Eliquis.   She was admitted to the hospital from 10/14-18/18/22.  During her stay, she developed AKI secondary to diuresis. Renal ultrasound done. Renal CT negative for hydronephrosis. Cardiology and urology consults obtained.This resolved with hydration and she was discharged back home.    Recently went to the ED on 10/30/21. She was awoken with acute onset of SOB and chest pain. Went to ED, BNP 733, cxray consistent with vascular congestion. She was diuresed with IV lasix, d/c'd home with lasix 20 mg QD.   Echo report from 10/04/21 reviewed and showed an EF of 45-50% along with mild LVH and mild MR.    LHC on 07/27/2019 showed: Prox RCA lesion is 40% stenosed. Ost LAD to Prox LAD lesion is 75% stenosed. 1st Mrg lesion is 30% stenosed.   Angiography done 08/02/2019: 1st Mrg lesion is 30% stenosed. Ost LAD lesion is 40% stenosed. Mid LAD lesion is 50% stenosed. Prox LAD lesion is 75% stenosed. Post intervention, there is a 0% residual stenosis. A drug-eluting stent was successfully placed using a STENT RESOLUTE ONYX 2.5X12.    She presents today with a chief complaint of a follow-up visit. She says that she's currently feeling great and voices no complaints. She specifically denies any difficulty sleeping, dizziness, abdominal distention, pedal edema, chest pain, palpitations, pedal edema, weight gain or fatigue.   She says that she tried taking jardiance and within 3 days, she had nausea, diarrhea, dizziness and generally felt unwell. She stopped taking it and symptoms resolves. She mentions not wanting to re-challenge with this medication. Will add this today to her allergy list  Past Medical History:  Diagnosis Date   Anemia    vitamin d deficiency   Anxiety    Breast cancer ([place])     bilateral mastectomies.  different types of cancer in each breast   Cardiomyopathy due to chemotherapy [facility] 2011   Chronic anticoagulation    Apixaban   Chronic pain 2019   Chronic SI joint pain    Coronary artery disease    DDD (degenerative disc disease), lumbar    First degree AV block    GERD (gastroesophageal reflux disease)    Hyperlipidemia    Hypertension    Insomnia    Lumbar herniated disc    Neuropathy    Paroxysmal atrial fibrillation (HCC)    Personal history of chemotherapy    Personal history of radiation therapy    S/P drug eluting coronary stent placement 08/02/2019   2.5 x 12 mm Resolute Onyx to pLAD   Valvular insufficiency    Mild   Past Surgical History:  Procedure Laterality Date   APPENDECTOMY  1961   BREAST IMPLANT REMOVAL Bilateral 2016   d/t infection. nothing replaced   CARDIAC CATHETERIZATION  2018   no blockages. pt thought it was an MI, but it wasn't   CESAREAN SECTION     x 3   CORONARY STENT INTERVENTION N/A 08/02/2019   Procedure: CORONARY STENT INTERVENTION;  Surgeon: Wellington Hampshire, MD;  Location: [place] [facility];  Service: Cardiovascular;  Laterality: N/A;   EYE SURGERY Bilateral 2013   cataract extractions with iol. cornea replacement also   INTRAVASCULAR PRESSURE WIRE/FFR STUDY N/A 08/02/2019   Procedure: INTRAVASCULAR PRESSURE  WIRE/FFR STUDY;  Surgeon: Wellington Hampshire, MD;  Location: [place] [facility];  Service: Cardiovascular;  Laterality: N/A;   KYPHOPLASTY N/A 06/17/2021   Procedure: L2 KYPHOPLASTY;  Surgeon: Hessie Knows, MD;  Location: [facility];  Service: Orthopedics;  Laterality: N/A;   LEFT HEART CATH AND CORONARY ANGIOGRAPHY Left 07/27/2019   Procedure: LEFT HEART CATH AND CORONARY ANGIOGRAPHY;  Surgeon: Corey Skains, MD;  Location: [place] [facility];  Service: Cardiovascular;  Laterality: Left;   LUMBAR LAMINECTOMY/DECOMPRESSION MICRODISCECTOMY N/A 06/29/2018   Procedure: LUMBAR LAMINECTOMY/DECOMPRESSION  MICRODISCECTOMY 1 LEVEL-L4-5, L5-S1 LATERAL DISCECTOMY;  Surgeon: Meade Maw, MD;  Location: [facility];  Service: Neurosurgery;  Laterality: N/A;   MASTECTOMY Bilateral 2011, 2016   5 diff procedures. reconstruction saline removed d/t infx   nsvd     x 1   PLACEMENT OF BREAST IMPLANTS Bilateral 2016   PORT-A-CATH REMOVAL  2016   TONSILLECTOMY     Family History  Problem Relation Age of Onset   Heart failure Mother    Hypertension Mother    Cervical cancer Mother    Renal Disease Mother    Cancer Father    Tuberculosis Maternal Grandfather    Breast cancer Paternal Grandmother    Breast cancer Paternal Aunt    Social History   Tobacco Use   Smoking status: Former    Packs/day: 0.25    Years: 3.00    Pack years: 0.75    Types: Cigarettes    Quit date: 10/21/1965    Years since quitting: 56.1   Smokeless tobacco: Never  Substance Use Topics   Alcohol use: No   Allergies  Allergen Reactions   Hydrocodone-Acetaminophen Nausea And Vomiting    Severe vomitting   Empagliflozin Diarrhea and Other (See Comments)    Jardiance- lightheaded and diarrhea   Morphine And Related Nausea And Vomiting    Pt preference    Ciprofloxacin Other (See Comments)    Joint pain    Gabapentin Other (See Comments)    Eyes blurry; vision decreased, dizziness     Prior to Admission medications   Medication Sig Start Date End Date Taking? Authorizing Provider  alendronate (FOSAMAX) 70 MG tablet Take 70 mg by mouth once a week. 08/21/21  Yes [provider]  amiodarone (PACERONE) 200 MG tablet Take 200 mg by mouth 2 (two) times daily. 04/17/21  Yes [provider]  atorvastatin (LIPITOR) 10 MG tablet Take 1 tablet (10 mg total) by mouth daily. 10/08/21  Yes Nolberto Hanlon, MD  Cholecalciferol (VITAMIN D3 PO) Take 1 tablet by mouth in the morning.   Yes [provider]  ELIQUIS 5 MG TABS tablet Take 5 mg by mouth 2 (two) times daily. 06/01/21  Yes [provider]  furosemide (LASIX) 20 MG tablet Take 1 tablet (20 mg total) by mouth daily. 10/30/21 10/30/22 Yes Vladimir Crofts, MD  levothyroxine (SYNTHROID) 25 MCG tablet Take 25 mcg by mouth in the morning. 06/11/21  Yes [provider]  MAGNESIUM PO Take 1 tablet by mouth in the morning.   Yes [provider]  meclizine (ANTIVERT) 12.5 MG tablet Take 12.5 mg by mouth 3 (three) times daily. 10/21/21  Yes [provider]  metoprolol succinate (TOPROL-XL) 25 MG 24 hr tablet Take 0.5 tablets (12.5 mg total) by mouth 2 (two) times daily. 10/07/21  Yes Nolberto Hanlon, MD  Potassium Citrate 99 MG CAPS Take 1 tablet by mouth daily.   Yes [provider]  empagliflozin (JARDIANCE)  10 MG TABS tablet Take 1 tablet (10 mg total) by mouth daily before breakfast. Patient not taking: Reported on 11/17/2021 11/03/21   Alisa Graff, FNP   Review of Systems  Constitutional:  Negative for malaise/fatigue.  HENT:  Negative for congestion and sore throat.   Eyes: Negative.  Negative for blurred vision and double vision.  Respiratory:  Negative for cough, sputum production, shortness of breath and wheezing.   Cardiovascular:  Negative for chest pain, palpitations and leg swelling.  Gastrointestinal: Negative.   Genitourinary: Negative.   Musculoskeletal:  Negative for back pain, falls and neck pain.  Skin: Negative.   Neurological:  Negative for dizziness, weakness and headaches.  Endo/Heme/Allergies: Negative.   Psychiatric/Behavioral:  Negative for depression. The patient is not nervous/anxious.     Vitals:   12/02/21 0838  BP: (!) 149/75  Pulse: 67  Resp: 18  SpO2: 100%  Weight: 147 lb 2 oz (66.7 kg)  Height: 5\' 5"  (1.651 m)   Wt Readings from Last 3 Encounters:  12/02/21 147 lb 2 oz (66.7 kg)  11/27/21 145 lb (65.8 kg)  11/03/21 147 lb (66.7 kg)   Lab Results  Component Value Date   CREATININE 1.22 (H) 11/03/2021   CREATININE 1.18 (H) 10/30/2021   CREATININE 1.34 (H)  10/07/2021    Physical Exam Vitals and nursing note reviewed. Exam conducted with a chaperone present (husband).  Constitutional:      General: She is not in acute distress.    Appearance: Normal appearance.  Neck:     Vascular: No carotid bruit.  Cardiovascular:     Rate and Rhythm: Normal rate and regular rhythm.     Pulses: Normal pulses.     Heart sounds: Normal heart sounds.  Pulmonary:     Effort: No respiratory distress.     Breath sounds: No wheezing or rales.  Abdominal:     General: There is no distension.     Palpations: Abdomen is soft.     Tenderness: There is no abdominal tenderness.  Musculoskeletal:        General: No swelling.     Cervical back: Normal range of motion.     Right lower leg: No edema.     Left lower leg: No edema.  Skin:    General: Skin is warm and dry.  Neurological:     General: No focal deficit present.     Mental Status: She is alert and oriented to person, place, and time.  Psychiatric:        Mood and Affect: Mood normal.        Thought Content: Thought content normal.        Judgment: Judgment normal.      Assessment & Plan:   1: Chronic heart failure with mildly reduced ejection fraction- - NYHA class I - euvolemic today  - weighing daily; reminded to call for an overnight weight gain of > 2 pounds or a weekly weight gain of > 5 pounds - weight stable from last visit here 1 month ago - not adding salt to her food and rarely do they eat out - currently on GDMT of metoprolol; unable to tolerate jardiance - isn't interested in trying jardiance again or adding any other medications at this time - denies flu vaccine - BNP 10/30/21 was 733  2: HTN- - BP mildly elevated (149/75) - saw PCP on 10/20/21 - BMP 11/03/21 reviewed and showed sodium 138, potassium 4.0, creatinine 1.22 and GFR 46  3: CAD- - stent placed in LAD in 2020 - participating in pulmonary rehab twice a week  4: Paroxysmal atrial fibrillation- - saw  cardiology Nehemiah Massed) 10/23/21 - RRR today - on amiodarone and eliquis   Patient did not bring her medications nor a list. Each medication was verbally reviewed with the patient and she was encouraged to bring the bottles to every visit to confirm accuracy of list.   Return in 3 months or sooner for any questions/problems before then.

## 2021-12-01 ENCOUNTER — Other Ambulatory Visit: Payer: Self-pay

## 2021-12-01 DIAGNOSIS — I5032 Chronic diastolic (congestive) heart failure: Secondary | ICD-10-CM

## 2021-12-01 NOTE — Progress Notes (Signed)
Daily Session Note  Patient Details  Name: Kelsey Carter MRN: [id number] Date of Birth: 30-Jan-1946 Referring Provider:   Flowsheet Row Pulmonary Rehab from 11/27/2021 in [facility] Cardiac and Pulmonary Rehab  Referring Provider Serafina Royals MD       Encounter Date: 12/01/2021  Check In:  Session Check In - 12/01/21 0915       Check-In   Supervising physician immediately available to respond to emergencies See telemetry face sheet for immediately available ER MD    Location [facility]-Cardiac & Pulmonary Rehab    Staff Present Birdie Sons, MPA, RN;Joseph Tessie Fass, RCP,RRT,BSRT;Colbe Viviano Amedeo Plenty, BS, ACSM CEP, Exercise Physiologist    Virtual Visit No    Medication changes reported     No    Fall or balance concerns reported    No    Warm-up and Cool-down Performed on first and last piece of equipment    Resistance Training Performed Yes    VAD Patient? No    PAD/SET Patient? No      Pain Assessment   Currently in Pain? No/denies                Social History   Tobacco Use  Smoking Status Former   Packs/day: 0.25   Years: 3.00   Pack years: 0.75   Types: Cigarettes   Quit date: 10/21/1965   Years since quitting: 56.1  Smokeless Tobacco Never    Goals Met:  Independence with exercise equipment Exercise tolerated well No report of concerns or symptoms today Strength training completed today  Goals Unmet:  Not Applicable  Comments: First full day of exercise!  Patient was oriented to gym and equipment including functions, settings, policies, and procedures.  Patient's individual exercise prescription and treatment plan were reviewed.  All starting workloads were established based on the results of the 6 minute walk test done at initial orientation visit.  The plan for exercise progression was also introduced and progression will be customized based on patient's performance and goals.    Dr. Emily Filbert is Medical Director for [facility].   Dr. Ottie Glazier is Medical Director for [facility] Pulmonary Rehabilitation.

## 2021-12-02 ENCOUNTER — Encounter: Payer: Self-pay | Admitting: Family

## 2021-12-02 ENCOUNTER — Telehealth: Payer: Self-pay | Admitting: Family

## 2021-12-02 ENCOUNTER — Ambulatory Visit: Payer: PPO | Attending: Family | Admitting: Family

## 2021-12-02 ENCOUNTER — Other Ambulatory Visit: Payer: Self-pay

## 2021-12-02 VITALS — BP 149/75 | HR 67 | Resp 18 | Ht 65.0 in | Wt 147.1 lb

## 2021-12-02 DIAGNOSIS — I480 Paroxysmal atrial fibrillation: Secondary | ICD-10-CM | POA: Diagnosis not present

## 2021-12-02 DIAGNOSIS — I10 Essential (primary) hypertension: Secondary | ICD-10-CM

## 2021-12-02 DIAGNOSIS — Z853 Personal history of malignant neoplasm of breast: Secondary | ICD-10-CM | POA: Insufficient documentation

## 2021-12-02 DIAGNOSIS — I5022 Chronic systolic (congestive) heart failure: Secondary | ICD-10-CM | POA: Insufficient documentation

## 2021-12-02 DIAGNOSIS — Z955 Presence of coronary angioplasty implant and graft: Secondary | ICD-10-CM | POA: Diagnosis not present

## 2021-12-02 DIAGNOSIS — I110 Hypertensive heart disease with heart failure: Secondary | ICD-10-CM | POA: Insufficient documentation

## 2021-12-02 DIAGNOSIS — Z7901 Long term (current) use of anticoagulants: Secondary | ICD-10-CM | POA: Diagnosis not present

## 2021-12-02 DIAGNOSIS — I2510 Atherosclerotic heart disease of native coronary artery without angina pectoris: Secondary | ICD-10-CM | POA: Diagnosis not present

## 2021-12-02 DIAGNOSIS — E785 Hyperlipidemia, unspecified: Secondary | ICD-10-CM | POA: Insufficient documentation

## 2021-12-02 NOTE — Telephone Encounter (Signed)
Patient was started on Jardiance at her previous appointment with [place] and submitted a request for patient assistance but she was unable to tolerate it so we took her off the medication and no longer need patient assitance for this.   Micheale Schlack, NT

## 2021-12-02 NOTE — Patient Instructions (Signed)
Continue weighing daily and call for an overnight weight gain of 3 pounds or more or a weekly weight gain of more than 5 pounds.  °

## 2021-12-04 ENCOUNTER — Other Ambulatory Visit: Payer: Self-pay

## 2021-12-04 DIAGNOSIS — I5032 Chronic diastolic (congestive) heart failure: Secondary | ICD-10-CM

## 2021-12-04 NOTE — Progress Notes (Signed)
Daily Session Note  Patient Details  Name: Kelsey Carter MRN: [id number] Date of Birth: 1946-12-01 Referring Provider:   Flowsheet Row Pulmonary Rehab from 11/27/2021 in [facility] Cardiac and Pulmonary Rehab  Referring Provider Serafina Royals MD       Encounter Date: 12/04/2021  Check In:  Session Check In - 12/04/21 0943       Check-In   Supervising physician immediately available to respond to emergencies See telemetry face sheet for immediately available ER MD    Location [facility] & Pul[facility]    Staff Present Birdie Sons, MPA, RN;Amanda Oletta Darter, BA, ACSM CEP, Exercise Physiologist;Kara Eliezer Bottom, MS, ASCM CEP, Exercise Physiologist;Melissa Caiola, RDN, LDN    Virtual Visit No    Medication changes reported     No    Fall or balance concerns reported    No    Warm-up and Cool-down Performed on first and last piece of equipment    Resistance Training Performed Yes    VAD Patient? No    PAD/SET Patient? No      Pain Assessment   Currently in Pain? No/denies                Social History   Tobacco Use  Smoking Status Former   Packs/day: 0.25   Years: 3.00   Pack years: 0.75   Types: Cigarettes   Quit date: 10/21/1965   Years since quitting: 56.1  Smokeless Tobacco Never    Goals Met:  Independence with exercise equipment Exercise tolerated well No report of concerns or symptoms today Strength training completed today  Goals Unmet:  Not Applicable  Comments: Pt able to follow exercise prescription today without complaint.  Will continue to monitor for progression.    Dr. Emily Filbert is Medical Director for [facility].  Dr. Ottie Glazier is Medical Director for [facility] Pulmonary Rehabilitation.

## 2021-12-09 ENCOUNTER — Other Ambulatory Visit: Payer: Self-pay

## 2021-12-09 DIAGNOSIS — I5032 Chronic diastolic (congestive) heart failure: Secondary | ICD-10-CM | POA: Diagnosis not present

## 2021-12-09 NOTE — Progress Notes (Signed)
Daily Session Note  Patient Details  Name: Kelsey Carter MRN: [id number] Date of Birth: 1946-07-14 Referring Provider:   Flowsheet Row Pulmonary Rehab from 11/27/2021 in [facility] Cardiac and Pulmonary Rehab  Referring Provider Serafina Royals MD       Encounter Date: 12/09/2021  Check In:  Session Check In - 12/09/21 0950       Check-In   Supervising physician immediately available to respond to emergencies See telemetry face sheet for immediately available ER MD    Location [facility]-Cardiac & Pulmonary Rehab    Staff Present Birdie Sons, MPA, RN;Jessica Luan Pulling, MA, RCEP, CCRP, CCET;Amanda Sommer, BA, ACSM CEP, Exercise Physiologist    Virtual Visit No    Medication changes reported     No    Fall or balance concerns reported    No    Warm-up and Cool-down Performed on first and last piece of equipment    Resistance Training Performed Yes    VAD Patient? No    PAD/SET Patient? No      Pain Assessment   Currently in Pain? No/denies                Social History   Tobacco Use  Smoking Status Former   Packs/day: 0.25   Years: 3.00   Pack years: 0.75   Types: Cigarettes   Quit date: 10/21/1965   Years since quitting: 56.1  Smokeless Tobacco Never    Goals Met:  Independence with exercise equipment Exercise tolerated well No report of concerns or symptoms today Strength training completed today  Goals Unmet:  Not Applicable  Comments: Pt able to follow exercise prescription today without complaint.  Will continue to monitor for progression.    Dr. Emily Filbert is Medical Director for [place].  Dr. Ottie Glazier is Medical Director for [facility] Pulmonary Rehabilitation.

## 2021-12-11 ENCOUNTER — Other Ambulatory Visit: Payer: Self-pay

## 2021-12-11 DIAGNOSIS — I5032 Chronic diastolic (congestive) heart failure: Secondary | ICD-10-CM

## 2021-12-11 NOTE — Progress Notes (Signed)
Daily Session Note  Patient Details  Name: Kelsey Carter MRN: [id number] Date of Birth: 31-Aug-1946 Referring Provider:   Flowsheet Row Pulmonary Rehab from 11/27/2021 in [facility] Cardiac and Pulmonary Rehab  Referring Provider Serafina Royals MD       Encounter Date: 12/11/2021  Check In:  Session Check In - 12/11/21 0945       Check-In   Supervising physician immediately available to respond to emergencies See telemetry face sheet for immediately available ER MD    Location [facility]-Cardiac & Pulmonary Rehab    Staff Present Birdie Sons, MPA, RN;Amanda Oletta Darter, BA, ACSM CEP, Exercise Physiologist;Donnelle Rubey Amedeo Plenty, BS, ACSM CEP, Exercise Physiologist;Melissa Caiola, RDN, LDN    Virtual Visit No    Medication changes reported     No    Fall or balance concerns reported    No    Warm-up and Cool-down Performed on first and last piece of equipment    Resistance Training Performed Yes    VAD Patient? No    PAD/SET Patient? No      Pain Assessment   Currently in Pain? No/denies                Social History   Tobacco Use  Smoking Status Former   Packs/day: 0.25   Years: 3.00   Pack years: 0.75   Types: Cigarettes   Quit date: 10/21/1965   Years since quitting: 56.1  Smokeless Tobacco Never    Goals Met:  Independence with exercise equipment Exercise tolerated well No report of concerns or symptoms today Strength training completed today  Goals Unmet:  Not Applicable  Comments: Pt able to follow exercise prescription today without complaint.  Will continue to monitor for progression.    Dr. Emily Filbert is Medical Director for [facility].  Dr. Ottie Glazier is Medical Director for [facility] Pulmonary Rehabilitation.

## 2021-12-17 ENCOUNTER — Encounter: Payer: Self-pay | Admitting: *Deleted

## 2021-12-17 DIAGNOSIS — I5032 Chronic diastolic (congestive) heart failure: Secondary | ICD-10-CM

## 2021-12-17 NOTE — Progress Notes (Signed)
Pulmonary Individual Treatment Plan  Patient Details  Name: Kelsey Carter MRN: [id number] Date of Birth: 09/08/1946 Referring Provider:   Flowsheet Row Pulmonary Rehab from 11/27/2021 in [facility] Cardiac and Pulmonary Rehab  Referring Provider Serafina Royals MD       Initial Encounter Date:  Flowsheet Row Pulmonary Rehab from 11/27/2021 in [facility] Cardiac and Pulmonary Rehab  Date 11/27/21       Visit Diagnosis: Heart failure, diastolic, chronic ([place])  Patient's Home Medications on Admission:  Current Outpatient Medications:    alendronate (FOSAMAX) 70 MG tablet, Take 70 mg by mouth once a week., Disp: , Rfl:    amiodarone (PACERONE) 200 MG tablet, Take 200 mg by mouth 2 (two) times daily., Disp: , Rfl:    atorvastatin (LIPITOR) 10 MG tablet, Take 1 tablet (10 mg total) by mouth daily., Disp: 30 tablet, Rfl: 0   Cholecalciferol (VITAMIN D3 PO), Take 1 tablet by mouth in the morning., Disp: , Rfl:    ELIQUIS 5 MG TABS tablet, Take 5 mg by mouth 2 (two) times daily., Disp: , Rfl:    furosemide (LASIX) 20 MG tablet, Take 1 tablet (20 mg total) by mouth daily., Disp: 30 tablet, Rfl: 1   levothyroxine (SYNTHROID) 25 MCG tablet, Take 25 mcg by mouth in the morning., Disp: , Rfl:    MAGNESIUM PO, Take 1 tablet by mouth in the morning., Disp: , Rfl:    meclizine (ANTIVERT) 12.5 MG tablet, Take 12.5 mg by mouth 3 (three) times daily., Disp: , Rfl:    metoprolol succinate (TOPROL-XL) 25 MG 24 hr tablet, Take 0.5 tablets (12.5 mg total) by mouth 2 (two) times daily., Disp: 30 tablet, Rfl: 0   Potassium Citrate 99 MG CAPS, Take 1 tablet by mouth daily., Disp: , Rfl:   Past Medical History: Past Medical History:  Diagnosis Date   Anemia    vitamin d deficiency   Anxiety    Breast cancer ([place])    bilateral mastectomies.  different types of cancer in each breast   Cardiomyopathy due to chemotherapy [facility]) 2011   Chronic anticoagulation    Apixaban   Chronic pain 2019   Chronic SI joint  pain    Coronary artery disease    DDD (degenerative disc disease), lumbar    First degree AV block    GERD (gastroesophageal reflux disease)    Hyperlipidemia    Hypertension    Insomnia    Lumbar herniated disc    Neuropathy    Paroxysmal atrial fibrillation (HCC)    Personal history of chemotherapy    Personal history of radiation therapy    S/P drug eluting coronary stent placement 08/02/2019   2.5 x 12 mm Resolute Onyx to pLAD   Valvular insufficiency    Mild    Tobacco Use: Social History   Tobacco Use  Smoking Status Former   Packs/day: 0.25   Years: 3.00   Pack years: 0.75   Types: Cigarettes   Quit date: 10/21/1965   Years since quitting: 56.1  Smokeless Tobacco Never    Labs: Recent Review Flowsheet Data     Labs for ITP Cardiac and Pulmonary Rehab Latest Ref Rng & Units 04/24/2013 08/24/2017 03/22/2018 09/21/2018   Cholestrol 0 - 200 mg/dL 160 117 - 130   LDLCALC 0 - 99 mg/dL 87 48 - 59   HDL >40 mg/dL 54 53 - 49   Trlycerides <150 mg/dL 97 82 - 108   Hemoglobin A1c 4.8 - 5.6 % - -  5.9(H) 5.6        Pulmonary Assessment Scores:  Pulmonary Assessment Scores     Row Name 11/27/21 1112         ADL UCSD   ADL Phase Entry     SOB Score total 14     Rest 0     Walk 4     Stairs 4     Bath 0     Dress 0     Shop 1       CAT Score   CAT Score 3       mMRC Score   mMRC Score 1              UCSD: Self-administered rating of dyspnea associated with activities of daily living (ADLs) 6-point scale (0 = "not at all" to 5 = "maximal or unable to do because of breathlessness")  Scoring Scores range from 0 to 120.  Minimally important difference is 5 units  CAT: CAT can identify the health impairment of COPD patients and is better correlated with disease progression.  CAT has a scoring range of zero to 40. The CAT score is classified into four groups of low (less than 10), medium (10 - 20), high (21-30) and very high (31-40) based on the impact  level of disease on health status. A CAT score over 10 suggests significant symptoms.  A worsening CAT score could be explained by an exacerbation, poor medication adherence, poor inhaler technique, or progression of COPD or comorbid conditions.  CAT MCID is 2 points  mMRC: mMRC (Modified Medical Research Council) Dyspnea Scale is used to assess the degree of baseline functional disability in patients of respiratory disease due to dyspnea. No minimal important difference is established. A decrease in score of 1 point or greater is considered a positive change.   Pulmonary Function Assessment:  Pulmonary Function Assessment - 11/17/21 1551       Breath   Shortness of Breath Yes;Limiting activity             Exercise Target Goals: Exercise Program Goal: Individual exercise prescription set using results from initial 6 min walk test and THRR while considering  patients activity barriers and safety.   Exercise Prescription Goal: Initial exercise prescription builds to 30-45 minutes a day of aerobic activity, 2-3 days per week.  Home exercise guidelines will be given to patient during program as part of exercise prescription that the participant will acknowledge.  Education: Aerobic Exercise: - Group verbal and visual presentation on the components of exercise prescription. Introduces F.I.T.T principle from ACSM for exercise prescriptions.  Reviews F.I.T.T. principles of aerobic exercise including progression. Written material given at graduation. Flowsheet Row Pulmonary Rehab from 12/04/2021 in [facility] Cardiac and Pulmonary Rehab  Education need identified 11/27/21       Education: Resistance Exercise: - Group verbal and visual presentation on the components of exercise prescription. Introduces F.I.T.T principle from ACSM for exercise prescriptions  Reviews F.I.T.T. principles of resistance exercise including progression. Written material given at graduation.    Education: Exercise &  Equipment Safety: - Individual verbal instruction and demonstration of equipment use and safety with use of the equipment. Flowsheet Row Pulmonary Rehab from 11/17/2021 in [facility] Cardiac and Pulmonary Rehab  Date 11/17/21  Educator [facility]  Instruction Review Code 1- Verbalizes Understanding       Education: Exercise Physiology & General Exercise Guidelines: - Group verbal and written instruction with models to review the exercise physiology of the cardiovascular system  and associated critical values. Provides general exercise guidelines with specific guidelines to those with heart or lung disease.    Education: Flexibility, Balance, Mind/Body Relaxation: - Group verbal and visual presentation with interactive activity on the components of exercise prescription. Introduces F.I.T.T principle from ACSM for exercise prescriptions. Reviews F.I.T.T. principles of flexibility and balance exercise training including progression. Also discusses the mind body connection.  Reviews various relaxation techniques to help reduce and manage stress (i.e. Deep breathing, progressive muscle relaxation, and visualization). Balance handout provided to take home. Written material given at graduation.   Activity Barriers & Risk Stratification:  Activity Barriers & Cardiac Risk Stratification - 11/27/21 1106       Activity Barriers & Cardiac Risk Stratification   Activity Barriers Balance Concerns;Muscular Weakness;Deconditioning;History of Falls;Other (comment)    Comments Neuropathy bilateral legs             6 Minute Walk:  6 Minute Walk     Row Name 11/27/21 1118         6 Minute Walk   Phase Initial     Distance 1105 feet     Walk Time 6 minutes     # of Rest Breaks 0     MPH 2.09     METS 2.13     RPE 11     Perceived Dyspnea  0     VO2 Peak 7.45     Symptoms No     Resting HR 65 bpm     Resting BP 124/68     Resting Oxygen Saturation  97 %     Exercise Oxygen Saturation  during 6 min  walk 95 %     Max Ex. HR 86 bpm     Max Ex. BP 132/70     2 Minute Post BP 130/68       Interval HR   1 Minute HR 77     2 Minute HR 82     3 Minute HR 85     4 Minute HR 86     5 Minute HR 85     6 Minute HR 83     2 Minute Post HR 66     Interval Heart Rate? Yes       Interval Oxygen   Interval Oxygen? Yes     Baseline Oxygen Saturation % 97 %     1 Minute Oxygen Saturation % 97 %     1 Minute Liters of Oxygen 0 L  RA     2 Minute Oxygen Saturation % 96 %     2 Minute Liters of Oxygen 0 L     3 Minute Oxygen Saturation % 95 %     3 Minute Liters of Oxygen 0 L     4 Minute Oxygen Saturation % 97 %     4 Minute Liters of Oxygen 0 L     5 Minute Oxygen Saturation % 97 %     5 Minute Liters of Oxygen 0 L     6 Minute Oxygen Saturation % 95 %     6 Minute Liters of Oxygen 0 L     2 Minute Post Oxygen Saturation % 98 %     2 Minute Post Liters of Oxygen 0 L             Oxygen Initial Assessment:  Oxygen Initial Assessment - 11/27/21 1112       Home Oxygen   Home Oxygen Device  None    Sleep Oxygen Prescription None    Home Exercise Oxygen Prescription None    Home Resting Oxygen Prescription None    Compliance with Home Oxygen Use Yes      Initial 6 min Walk   Oxygen Used None      Program Oxygen Prescription   Program Oxygen Prescription None      Intervention   Short Term Goals To learn and understand importance of monitoring SPO2 with pulse oximeter and demonstrate accurate use of the pulse oximeter.;To learn and understand importance of maintaining oxygen saturations>88%;To learn and demonstrate proper pursed lip breathing techniques or other breathing techniques. ;To learn and exhibit compliance with exercise, home and travel O2 prescription    Long  Term Goals Exhibits compliance with exercise, home  and travel O2 prescription;Verbalizes importance of monitoring SPO2 with pulse oximeter and return demonstration;Maintenance of O2 saturations>88%;Exhibits  proper breathing techniques, such as pursed lip breathing or other method taught during program session;Compliance with respiratory medication             Oxygen Re-Evaluation:  Oxygen Re-Evaluation     Row Name 12/01/21 0916             Program Oxygen Prescription   Program Oxygen Prescription None         Home Oxygen   Home Oxygen Device None       Sleep Oxygen Prescription None       Home Exercise Oxygen Prescription None       Home Resting Oxygen Prescription None       Compliance with Home Oxygen Use Yes         Goals/Expected Outcomes   Short Term Goals To learn and understand importance of monitoring SPO2 with pulse oximeter and demonstrate accurate use of the pulse oximeter.;To learn and understand importance of maintaining oxygen saturations>88%;To learn and demonstrate proper pursed lip breathing techniques or other breathing techniques. ;To learn and exhibit compliance with exercise, home and travel O2 prescription       Long  Term Goals Exhibits compliance with exercise, home  and travel O2 prescription;Verbalizes importance of monitoring SPO2 with pulse oximeter and return demonstration;Maintenance of O2 saturations>88%;Exhibits proper breathing techniques, such as pursed lip breathing or other method taught during program session;Compliance with respiratory medication       Comments Reviewed PLB technique with pt.  Talked about how it works and it's importance in maintaining their exercise saturations.       Goals/Expected Outcomes Short: Become more profiecient at using PLB.   Long: Become independent at using PLB.                Oxygen Discharge (Final Oxygen Re-Evaluation):  Oxygen Re-Evaluation - 12/01/21 0916       Program Oxygen Prescription   Program Oxygen Prescription None      Home Oxygen   Home Oxygen Device None    Sleep Oxygen Prescription None    Home Exercise Oxygen Prescription None    Home Resting Oxygen Prescription None    Compliance  with Home Oxygen Use Yes      Goals/Expected Outcomes   Short Term Goals To learn and understand importance of monitoring SPO2 with pulse oximeter and demonstrate accurate use of the pulse oximeter.;To learn and understand importance of maintaining oxygen saturations>88%;To learn and demonstrate proper pursed lip breathing techniques or other breathing techniques. ;To learn and exhibit compliance with exercise, home and travel O2 prescription    Long  Term  Goals Exhibits compliance with exercise, home  and travel O2 prescription;Verbalizes importance of monitoring SPO2 with pulse oximeter and return demonstration;Maintenance of O2 saturations>88%;Exhibits proper breathing techniques, such as pursed lip breathing or other method taught during program session;Compliance with respiratory medication    Comments Reviewed PLB technique with pt.  Talked about how it works and it's importance in maintaining their exercise saturations.    Goals/Expected Outcomes Short: Become more profiecient at using PLB.   Long: Become independent at using PLB.             Initial Exercise Prescription:  Initial Exercise Prescription - 11/27/21 1100       Date of Initial Exercise RX and Referring Provider   Date 11/27/21    Referring Provider Serafina Royals MD      Oxygen   Maintain Oxygen Saturation 88% or higher      Recumbant Bike   Level 1    RPM 60    Watts 15    Minutes 15    METs 2.1      NuStep   Level 2    SPM 80    Minutes 15    METs 2.1      REL-XR   Level 1    Speed 50    Minutes 15    METs 2.1      Track   Laps 21    Minutes 15    METs 2.14      Prescription Details   Frequency (times per week) 2    Duration Progress to 30 minutes of continuous aerobic without signs/symptoms of physical distress      Intensity   THRR 40-80% of Max Heartrate 96-128    Ratings of Perceived Exertion 11-13    Perceived Dyspnea 0-4      Progression   Progression Continue to progress  workloads to maintain intensity without signs/symptoms of physical distress.      Resistance Training   Training Prescription Yes    Weight 3 lb    Reps 10-15             Perform Capillary Blood Glucose checks as needed.  Exercise Prescription Changes:   Exercise Prescription Changes     Row Name 11/27/21 1100 12/02/21 1400           Response to Exercise   Blood Pressure (Admit) 124/68 126/70      Blood Pressure (Exercise) 132/70 138/60      Blood Pressure (Exit) 130/68 124/65      Heart Rate (Admit) 65 bpm 68 bpm      Heart Rate (Exercise) 86 bpm 92 bpm      Heart Rate (Exit) 66 bpm 79 bpm      Oxygen Saturation (Admit) 97 % 95 %      Oxygen Saturation (Exercise) 95 % 95 %      Oxygen Saturation (Exit) 98 % 95 %      Rating of Perceived Exertion (Exercise) 11 11      Perceived Dyspnea (Exercise) 0 0      Symptoms none none      Comments walk test results first day      Duration -- Progress to 30 minutes of  aerobic without signs/symptoms of physical distress      Intensity -- THRR unchanged        Progression   Progression -- Continue to progress workloads to maintain intensity without signs/symptoms of physical distress.      Average  METs -- 2.7        Resistance Training   Training Prescription -- Yes      Weight -- 3 lb      Reps -- 10-15        Recumbant Bike   Level -- 1      Minutes -- 15      METs -- 3.18        NuStep   Level -- 2      Minutes -- 15      METs -- 2.2               Exercise Comments:   Exercise Comments     Row Name 12/01/21 0916           Exercise Comments First full day of exercise!  Patient was oriented to gym and equipment including functions, settings, policies, and procedures.  Patient's individual exercise prescription and treatment plan were reviewed.  All starting workloads were established based on the results of the 6 minute walk test done at initial orientation visit.  The plan for exercise progression was  also introduced and progression will be customized based on patient's performance and goals.                Exercise Goals and Review:   Exercise Goals     Row Name 11/27/21 1138             Exercise Goals   Increase Physical Activity Yes       Intervention Provide advice, education, support and counseling about physical activity/exercise needs.;Develop an individualized exercise prescription for aerobic and resistive training based on initial evaluation findings, risk stratification, comorbidities and participant's personal goals.       Expected Outcomes Short Term: Attend rehab on a regular basis to increase amount of physical activity.;Long Term: Add in home exercise to make exercise part of routine and to increase amount of physical activity.;Long Term: Exercising regularly at least 3-5 days a week.       Increase Strength and Stamina Yes       Intervention Provide advice, education, support and counseling about physical activity/exercise needs.;Develop an individualized exercise prescription for aerobic and resistive training based on initial evaluation findings, risk stratification, comorbidities and participant's personal goals.       Expected Outcomes Short Term: Increase workloads from initial exercise prescription for resistance, speed, and METs.;Short Term: Perform resistance training exercises routinely during rehab and add in resistance training at home;Long Term: Improve cardiorespiratory fitness, muscular endurance and strength as measured by increased METs and functional capacity (6MWT)       Able to understand and use rate of perceived exertion (RPE) scale Yes       Intervention Provide education and explanation on how to use RPE scale       Expected Outcomes Short Term: Able to use RPE daily in rehab to express subjective intensity level;Long Term:  Able to use RPE to guide intensity level when exercising independently       Able to understand and use Dyspnea scale Yes        Intervention Provide education and explanation on how to use Dyspnea scale       Expected Outcomes Short Term: Able to use Dyspnea scale daily in rehab to express subjective sense of shortness of breath during exertion;Long Term: Able to use Dyspnea scale to guide intensity level when exercising independently       Knowledge and understanding of Target Heart  Rate Range (THRR) Yes       Intervention Provide education and explanation of THRR including how the numbers were predicted and where they are located for reference       Expected Outcomes Short Term: Able to state/look up THRR;Short Term: Able to use daily as guideline for intensity in rehab;Long Term: Able to use THRR to govern intensity when exercising independently       Able to check pulse independently Yes       Intervention Provide education and demonstration on how to check pulse in carotid and radial arteries.;Review the importance of being able to check your own pulse for safety during independent exercise       Expected Outcomes Short Term: Able to explain why pulse checking is important during independent exercise;Long Term: Able to check pulse independently and accurately       Understanding of Exercise Prescription Yes       Intervention Provide education, explanation, and written materials on patient's individual exercise prescription       Expected Outcomes Short Term: Able to explain program exercise prescription;Long Term: Able to explain home exercise prescription to exercise independently                Exercise Goals Re-Evaluation :  Exercise Goals Re-Evaluation     [place] Name 12/01/21 0916             Exercise Goal Re-Evaluation   Exercise Goals Review Increase Physical Activity;Able to understand and use rate of perceived exertion (RPE) scale;Knowledge and understanding of Target Heart Rate Range (THRR);Understanding of Exercise Prescription;Increase Strength and Stamina;Able to understand and use Dyspnea  scale;Able to check pulse independently       Comments Reviewed RPE and dyspnea scales, THR and program prescription with pt today.  Pt voiced understanding and was given a copy of goals to take home.       Expected Outcomes Short: Use RPE daily to regulate intensity. Long: Follow program prescription in THR.                Discharge Exercise Prescription (Final Exercise Prescription Changes):  Exercise Prescription Changes - 12/02/21 1400       Response to Exercise   Blood Pressure (Admit) 126/70    Blood Pressure (Exercise) 138/60    Blood Pressure (Exit) 124/65    Heart Rate (Admit) 68 bpm    Heart Rate (Exercise) 92 bpm    Heart Rate (Exit) 79 bpm    Oxygen Saturation (Admit) 95 %    Oxygen Saturation (Exercise) 95 %    Oxygen Saturation (Exit) 95 %    Rating of Perceived Exertion (Exercise) 11    Perceived Dyspnea (Exercise) 0    Symptoms none    Comments first day    Duration Progress to 30 minutes of  aerobic without signs/symptoms of physical distress    Intensity THRR unchanged      Progression   Progression Continue to progress workloads to maintain intensity without signs/symptoms of physical distress.    Average METs 2.7      Resistance Training   Training Prescription Yes    Weight 3 lb    Reps 10-15      Recumbant Bike   Level 1    Minutes 15    METs 3.18      NuStep   Level 2    Minutes 15    METs 2.2  Nutrition:  Target Goals: Understanding of nutrition guidelines, daily intake of sodium 1500mg , cholesterol 200mg , calories 30% from fat and 7% or less from saturated fats, daily to have 5 or more servings of fruits and vegetables.  Education: All About Nutrition: -Group instruction provided by verbal, written material, interactive activities, discussions, models, and posters to present general guidelines for heart healthy nutrition including fat, fiber, MyPlate, the role of sodium in heart healthy nutrition, utilization of the  nutrition label, and utilization of this knowledge for meal planning. Follow up email sent as well. Written material given at graduation. Flowsheet Row Pulmonary Rehab from 12/04/2021 in [facility] Cardiac and Pulmonary Rehab  Education need identified 11/27/21       Biometrics:  Pre Biometrics - 11/27/21 1139       Pre Biometrics   Height 5' 3.2" (1.605 m)    Weight 145 lb (65.8 kg)    BMI (Calculated) 25.53    Single Leg Stand 1.83 seconds              Nutrition Therapy Plan and Nutrition Goals:  Nutrition Therapy & Goals - 11/27/21 1115       Intervention Plan   Intervention Prescribe, educate and counsel regarding individualized specific dietary modifications aiming towards targeted core components such as weight, hypertension, lipid management, diabetes, heart failure and other comorbidities.    Expected Outcomes Short Term Goal: Understand basic principles of dietary content, such as calories, fat, sodium, cholesterol and nutrients.;Short Term Goal: A plan has been developed with personal nutrition goals set during dietitian appointment.;Long Term Goal: Adherence to prescribed nutrition plan.             Nutrition Assessments:  MEDIFICTS Score Key: ?70 Need to make dietary changes  40-70 Heart Healthy Diet ? 40 Therapeutic Level Cholesterol Diet  Flowsheet Row Pulmonary Rehab from 11/27/2021 in [facility] Cardiac and Pulmonary Rehab  Picture Your Plate Total Score on Admission 78      Picture Your Plate Scores: <22 Unhealthy dietary pattern with much room for improvement. 41-50 Dietary pattern unlikely to meet recommendations for good health and room for improvement. 51-60 More healthful dietary pattern, with some room for improvement.  >60 Healthy dietary pattern, although there may be some specific behaviors that could be improved.   Nutrition Goals Re-Evaluation:   Nutrition Goals Discharge (Final Nutrition Goals Re-Evaluation):   Psychosocial: Target Goals:  Acknowledge presence or absence of significant depression and/or stress, maximize coping skills, provide positive support system. Participant is able to verbalize types and ability to use techniques and skills needed for reducing stress and depression.   Education: Stress, Anxiety, and Depression - Group verbal and visual presentation to define topics covered.  Reviews how body is impacted by stress, anxiety, and depression.  Also discusses healthy ways to reduce stress and to treat/manage anxiety and depression.  Written material given at graduation.   Education: Sleep Hygiene -Provides group verbal and written instruction about how sleep can affect your health.  Define sleep hygiene, discuss sleep cycles and impact of sleep habits. Review good sleep hygiene tips.    Initial Review & Psychosocial Screening:  Initial Psych Review & Screening - 11/17/21 1553       Initial Review   Current issues with None Identified      Family Dynamics   Good Support System? Yes    Comments She can look to her husband, son and daughter for support. She has a positive outlook on her health. She has done the  program once before and is excited to do it again.      Barriers   Psychosocial barriers to participate in program There are no identifiable barriers or psychosocial needs.;The patient should benefit from training in stress management and relaxation.      Screening Interventions   Interventions Encouraged to exercise;To provide support and resources with identified psychosocial needs;Provide feedback about the scores to participant    Expected Outcomes Short Term goal: Utilizing psychosocial counselor, staff and physician to assist with identification of specific Stressors or current issues interfering with healing process. Setting desired goal for each stressor or current issue identified.;Long Term Goal: Stressors or current issues are controlled or eliminated.;Short Term goal: Identification and review  with participant of any Quality of Life or Depression concerns found by scoring the questionnaire.;Long Term goal: The participant improves quality of Life and PHQ9 Scores as seen by post scores and/or verbalization of changes             Quality of Life Scores:  Scores of 19 and below usually indicate a poorer quality of life in these areas.  A difference of  2-3 points is a clinically meaningful difference.  A difference of 2-3 points in the total score of the Quality of Life Index has been associated with significant improvement in overall quality of life, self-image, physical symptoms, and general health in studies assessing change in quality of life.  PHQ-9: Recent Review Flowsheet Data     Depression screen [facility] 2/9 12/02/2021 11/27/2021 01/29/2020 08/24/2019 09/21/2018   Decreased Interest 0 0 0 0 0   Down, Depressed, Hopeless 0 0 0 0 0   PHQ - 2 Score 0 0 0 0 0   Altered sleeping - 0 0 0 0   Tired, decreased energy - 1 0 1 0   Change in appetite - 0 0 0 0   Feeling bad or failure about yourself  - 0 0 0 0   Trouble concentrating - 0 0 0 0   Moving slowly or fidgety/restless - 0 0 0 0   Suicidal thoughts - 0 0 0 0   PHQ-9 Score - 1 0 1 0   Difficult doing work/chores - Not difficult at all - Not difficult at all -      Interpretation of Total Score  Total Score Depression Severity:  1-4 = Minimal depression, 5-9 = Mild depression, 10-14 = Moderate depression, 15-19 = Moderately severe depression, 20-27 = Severe depression   Psychosocial Evaluation and Intervention:  Psychosocial Evaluation - 11/17/21 1554       Psychosocial Evaluation & Interventions   Interventions Encouraged to exercise with the program and follow exercise prescription;Stress management education;Relaxation education    Comments She can look to her husband, son and daughter for support. She has a positive outlook on her health. She has done the program once before and is excited to do it again.    Expected  Outcomes Short: Start [facility] to help with mood. Long: Maintain a healthy mental state.    Continue Psychosocial Services  Follow up required by staff             Psychosocial Re-Evaluation:   Psychosocial Discharge (Final Psychosocial Re-Evaluation):   Education: Education Goals: Education classes will be provided on a weekly basis, covering required topics. Participant will state understanding/return demonstration of topics presented.  Learning Barriers/Preferences:  Learning Barriers/Preferences - 11/17/21 1552       Learning Barriers/Preferences   Learning Barriers None  Learning Preferences None             General Pulmonary Education Topics:  Infection Prevention: - Provides verbal and written material to individual with discussion of infection control including proper hand washing and proper equipment cleaning during exercise session. Flowsheet Row Pulmonary Rehab from 11/17/2021 in [facility] Cardiac and Pulmonary Rehab  Date 11/17/21  Educator [facility]  Instruction Review Code 1- Verbalizes Understanding       Falls Prevention: - Provides verbal and written material to individual with discussion of falls prevention and safety. Flowsheet Row Pulmonary Rehab from 11/17/2021 in [facility] Cardiac and Pulmonary Rehab  Date 11/17/21  Educator [facility]  Instruction Review Code 1- Verbalizes Understanding       Chronic Lung Disease Review: - Group verbal instruction with posters, models, PowerPoint presentations and videos,  to review new updates, new respiratory medications, new advancements in procedures and treatments. Providing information on websites and "800" numbers for continued self-education. Includes information about supplement oxygen, available portable oxygen systems, continuous and intermittent flow rates, oxygen safety, concentrators, and Medicare reimbursement for oxygen. Explanation of Pulmonary Drugs, including class, frequency, complications, importance of  spacers, rinsing mouth after steroid MDI's, and proper cleaning methods for nebulizers. Review of basic lung anatomy and physiology related to function, structure, and complications of lung disease. Review of risk factors. Discussion about methods for diagnosing sleep apnea and types of masks and machines for OSA. Includes a review of the use of types of environmental controls: home humidity, furnaces, filters, dust mite/pet prevention, HEPA vacuums. Discussion about weather changes, air quality and the benefits of nasal washing. Instruction on Warning signs, infection symptoms, calling MD promptly, preventive modes, and value of vaccinations. Review of effective airway clearance, coughing and/or vibration techniques. Emphasizing that all should Create an Action Plan. Written material given at graduation.   AED/CPR: - Group verbal and written instruction with the use of models to demonstrate the basic use of the AED with the basic ABC's of resuscitation.    Anatomy and Cardiac Procedures: - Group verbal and visual presentation and models provide information about basic cardiac anatomy and function. Reviews the testing methods done to diagnose heart disease and the outcomes of the test results. Describes the treatment choices: Medical Management, Angioplasty, or Coronary Bypass Surgery for treating various heart conditions including Myocardial Infarction, Angina, Valve Disease, and Cardiac Arrhythmias.  Written material given at graduation. Flowsheet Row Pulmonary Rehab from 12/04/2021 in [facility] Cardiac and Pulmonary Rehab  Education need identified 11/27/21       Medication Safety: - Group verbal and visual instruction to review commonly prescribed medications for heart and lung disease. Reviews the medication, class of the drug, and side effects. Includes the steps to properly store meds and maintain the prescription regimen.  Written material given at graduation.   Other: -Provides group and  verbal instruction on various topics (see comments)   Knowledge Questionnaire Score:  Knowledge Questionnaire Score - 11/27/21 1112       Knowledge Questionnaire Score   Pre Score 23/26: Angina, Nutrition, Exercise              Core Components/Risk Factors/Patient Goals at Admission:  Personal Goals and Risk Factors at Admission - 11/27/21 1140       Core Components/Risk Factors/Patient Goals on Admission    Weight Management Yes;Weight Maintenance   Patient already lost weight and wants to maintain   Intervention Weight Management: Develop a combined nutrition and exercise program designed to reach desired caloric intake,  while maintaining appropriate intake of nutrient and fiber, sodium and fats, and appropriate energy expenditure required for the weight goal.;Weight Management: Provide education and appropriate resources to help participant work on and attain dietary goals.;Weight Management/Obesity: Establish reasonable short term and long term weight goals.    Admit Weight 145 lb (65.8 kg)    Goal Weight: Short Term 145 lb (65.8 kg)    Goal Weight: Long Term 145 lb (65.8 kg)    Expected Outcomes Short Term: Continue to assess and modify interventions until short term weight is achieved;Long Term: Adherence to nutrition and physical activity/exercise program aimed toward attainment of established weight goal;Understanding recommendations for meals to include 15-35% energy as protein, 25-35% energy from fat, 35-60% energy from carbohydrates, less than 200mg  of dietary cholesterol, 20-35 gm of total fiber daily;Understanding of distribution of calorie intake throughout the day with the consumption of 4-5 meals/snacks;Weight Maintenance: Understanding of the daily nutrition guidelines, which includes 25-35% calories from fat, 7% or less cal from saturated fats, less than 200mg  cholesterol, less than 1.5gm of sodium, & 5 or more servings of fruits and vegetables daily    Improve shortness  of breath with ADL's Yes    Intervention Provide education, individualized exercise plan and daily activity instruction to help decrease symptoms of SOB with activities of daily living.    Expected Outcomes Short Term: Improve cardiorespiratory fitness to achieve a reduction of symptoms when performing ADLs;Long Term: Be able to perform more ADLs without symptoms or delay the onset of symptoms    Increase knowledge of respiratory medications and ability to use respiratory devices properly  Yes    Intervention Provide education and demonstration as needed of appropriate use of medications, inhalers, and oxygen therapy.    Expected Outcomes Short Term: Achieves understanding of medications use. Understands that oxygen is a medication prescribed by physician. Demonstrates appropriate use of inhaler and oxygen therapy.;Long Term: Maintain appropriate use of medications, inhalers, and oxygen therapy.    Hypertension Yes    Intervention Provide education on lifestyle modifcations including regular physical activity/exercise, weight management, moderate sodium restriction and increased consumption of fresh fruit, vegetables, and low fat dairy, alcohol moderation, and smoking cessation.;Monitor prescription use compliance.    Expected Outcomes Short Term: Continued assessment and intervention until BP is < 140/47mm HG in hypertensive participants. < 130/43mm HG in hypertensive participants with diabetes, heart failure or chronic kidney disease.;Long Term: Maintenance of blood pressure at goal levels.    Lipids Yes    Intervention Provide education and support for participant on nutrition & aerobic/resistive exercise along with prescribed medications to achieve LDL 70mg , HDL >40mg .    Expected Outcomes Short Term: Participant states understanding of desired cholesterol values and is compliant with medications prescribed. Participant is following exercise prescription and nutrition guidelines.;Long Term:  Cholesterol controlled with medications as prescribed, with individualized exercise RX and with personalized nutrition plan. Value goals: LDL < 70mg , HDL > 40 mg.             Education:Diabetes - Individual verbal and written instruction to review signs/symptoms of diabetes, desired ranges of glucose level fasting, after meals and with exercise. Acknowledge that pre and post exercise glucose checks will be done for 3 sessions at entry of program.   Know Your Numbers and Heart Failure: - Group verbal and visual instruction to discuss disease risk factors for cardiac and pulmonary disease and treatment options.  Reviews associated critical values for Overweight/Obesity, Hypertension, Cholesterol, and Diabetes.  Discusses basics of heart  failure: signs/symptoms and treatments.  Introduces Heart Failure Zone chart for action plan for heart failure.  Written material given at graduation.   Core Components/Risk Factors/Patient Goals Review:    Core Components/Risk Factors/Patient Goals at Discharge (Final Review):    ITP Comments:  ITP Comments     Row Name 11/17/21 1550 11/27/21 1058 12/01/21 0915 12/17/21 0646     ITP Comments Virtual Visit completed. Patient informed on EP and RD appointment and 6 Minute walk test. Patient also informed of patient health questionnaires on My Chart. Patient Verbalizes understanding. Visit diagnosis can be found in [facility] 10/23/2021. Completed 6MWT and gym orientation. Initial ITP created and sent for review to Dr. Ottie Glazier , Medical Director. First full day of exercise!  Patient was oriented to gym and equipment including functions, settings, policies, and procedures.  Patient's individual exercise prescription and treatment plan were reviewed.  All starting workloads were established based on the results of the 6 minute walk test done at initial orientation visit.  The plan for exercise progression was also introduced and progression will be customized  based on patient's performance and goals. 30 Day review completed. Medical Director ITP review done, changes made as directed, and signed approval by Medical Director.             Comments:

## 2021-12-18 ENCOUNTER — Other Ambulatory Visit: Payer: Self-pay

## 2021-12-18 DIAGNOSIS — I5032 Chronic diastolic (congestive) heart failure: Secondary | ICD-10-CM

## 2021-12-18 NOTE — Progress Notes (Signed)
Daily Session Note  Patient Details  Name: Kelsey Carter MRN: [id number] Date of Birth: Mar 15, 1946 Referring Provider:   Flowsheet Row Pulmonary Rehab from 11/27/2021 in [facility] Cardiac and Pulmonary Rehab  Referring Provider Serafina Royals MD       Encounter Date: 12/18/2021  Check In:  Session Check In - 12/18/21 1103       Check-In   Supervising physician immediately available to respond to emergencies See telemetry face sheet for immediately available ER MD    Location [facility]-Cardiac & Pulmonary Rehab    Staff Present Will Bonnet, RN,BC,MSN;Melissa Valinda, RDN, Rowe Pavy, BA, ACSM CEP, Exercise Physiologist    Virtual Visit No    Medication changes reported     No    Fall or balance concerns reported    No    Warm-up and Cool-down Performed on first and last piece of equipment    Resistance Training Performed Yes    VAD Patient? No    PAD/SET Patient? No      Pain Assessment   Currently in Pain? No/denies                Social History   Tobacco Use  Smoking Status Former   Packs/day: 0.25   Years: 3.00   Pack years: 0.75   Types: Cigarettes   Quit date: 10/21/1965   Years since quitting: 56.1  Smokeless Tobacco Never    Goals Met:  Independence with exercise equipment Exercise tolerated well No report of concerns or symptoms today Strength training completed today  Goals Unmet:  Not Applicable  Comments: Pt able to follow exercise prescription today without complaint.  Will continue to monitor for progression.    Dr. Emily Filbert is Medical Director for [place].  Dr. Ottie Glazier is Medical Director for [facility] Pulmonary Rehabilitation.

## 2021-12-23 ENCOUNTER — Other Ambulatory Visit: Payer: Self-pay

## 2021-12-23 ENCOUNTER — Encounter: Payer: PPO | Attending: Internal Medicine

## 2021-12-23 DIAGNOSIS — I5032 Chronic diastolic (congestive) heart failure: Secondary | ICD-10-CM | POA: Insufficient documentation

## 2021-12-23 NOTE — Progress Notes (Signed)
Daily Session Note  Patient Details  Name: Kelsey Carter MRN: [id number] Date of Birth: Sep 29, 1946 Referring Provider:   Flowsheet Row Pulmonary Rehab from 11/27/2021 in [facility] Cardiac and Pulmonary Rehab  Referring Provider Serafina Royals MD       Encounter Date: 12/23/2021  Check In:  Session Check In - 12/23/21 0943       Check-In   Supervising physician immediately available to respond to emergencies See telemetry face sheet for immediately available ER MD    Location [facility]-Cardiac & Pulmonary Rehab    Staff Present Birdie Sons, MPA, RN;Jessica Luan Pulling, MA, RCEP, CCRP, CCET;Amanda Sommer, BA, ACSM CEP, Exercise Physiologist    Virtual Visit No    Medication changes reported     No    Fall or balance concerns reported    No    Warm-up and Cool-down Performed on first and last piece of equipment    Resistance Training Performed Yes    VAD Patient? No    PAD/SET Patient? No      Pain Assessment   Currently in Pain? No/denies                Social History   Tobacco Use  Smoking Status Former   Packs/day: 0.25   Years: 3.00   Pack years: 0.75   Types: Cigarettes   Quit date: 10/21/1965   Years since quitting: 56.2  Smokeless Tobacco Never    Goals Met:  Independence with exercise equipment Exercise tolerated well No report of concerns or symptoms today Strength training completed today  Goals Unmet:  Not Applicable  Comments: Pt able to follow exercise prescription today without complaint.  Will continue to monitor for progression.  Reviewed home exercise with pt today.  Pt plans to walk at home and continue to go to [facility] twice a week too,  for exercise.  Reviewed THR, pulse, RPE, sign and symptoms, pulse oximetery and when to call 911 or MD.  Also discussed weather considerations and indoor options.  Pt voiced understanding.   Dr. Emily Filbert is Medical Director for [place].  Dr. Ottie Glazier is Medical Director for  [facility] Pulmonary Rehabilitation.

## 2021-12-25 ENCOUNTER — Other Ambulatory Visit: Payer: Self-pay

## 2021-12-25 DIAGNOSIS — I5032 Chronic diastolic (congestive) heart failure: Secondary | ICD-10-CM

## 2021-12-25 NOTE — Progress Notes (Signed)
Daily Session Note  Patient Details  Name: Kelsey Carter MRN: [id number] Date of Birth: 30-Jan-1946 Referring Provider:   Flowsheet Row Pulmonary Rehab from 11/27/2021 in [facility] Cardiac and Pulmonary Rehab  Referring Provider Serafina Royals MD       Encounter Date: 12/25/2021  Check In:  Session Check In - 12/25/21 0952       Check-In   Supervising physician immediately available to respond to emergencies See telemetry face sheet for immediately available ER MD    Location [facility]-Cardiac & Pulmonary Rehab    Staff Present Birdie Sons, MPA, Elveria Rising, BA, ACSM CEP, Exercise Physiologist;Freda Jaquith Amedeo Plenty, BS, ACSM CEP, Exercise Physiologist    Virtual Visit No    Medication changes reported     No    Fall or balance concerns reported    No    Warm-up and Cool-down Performed on first and last piece of equipment    Resistance Training Performed Yes    VAD Patient? No    PAD/SET Patient? No      Pain Assessment   Currently in Pain? No/denies                Social History   Tobacco Use  Smoking Status Former   Packs/day: 0.25   Years: 3.00   Pack years: 0.75   Types: Cigarettes   Quit date: 10/21/1965   Years since quitting: 56.2  Smokeless Tobacco Never    Goals Met:  Independence with exercise equipment Exercise tolerated well No report of concerns or symptoms today Strength training completed today  Goals Unmet:  Not Applicable  Comments: Pt able to follow exercise prescription today without complaint.  Will continue to monitor for progression.    Dr. Emily Filbert is Medical Director for [place].  Dr. Ottie Glazier is Medical Director for [facility] Pulmonary Rehabilitation.

## 2021-12-30 ENCOUNTER — Other Ambulatory Visit: Payer: Self-pay

## 2021-12-30 DIAGNOSIS — I5032 Chronic diastolic (congestive) heart failure: Secondary | ICD-10-CM

## 2021-12-30 NOTE — Progress Notes (Signed)
Daily Session Note  Patient Details  Name: Kelsey Carter MRN: [id number] Date of Birth: [birth year] Referring Provider:   Flowsheet Row Pulmonary Rehab from 11/27/2021 in [facility] Cardiac and Pulmonary Rehab  Referring Provider Serafina Royals MD       Encounter Date: 12/30/2021  Check In:  Session Check In - 12/30/21 0952       Check-In   Supervising physician immediately available to respond to emergencies See telemetry face sheet for immediately available ER MD    Location [facility]-Cardiac & Pulmonary Rehab    Staff Present Birdie Sons, MPA, RN;Amanda Oletta Darter, BA, ACSM CEP, Exercise Physiologist;Jessica Luan Pulling, MA, RCEP, CCRP, CCET    Virtual Visit No    Medication changes reported     No    Fall or balance concerns reported    No    Warm-up and Cool-down Performed on first and last piece of equipment    Resistance Training Performed Yes    VAD Patient? No    PAD/SET Patient? No      Pain Assessment   Currently in Pain? No/denies                Social History   Tobacco Use  Smoking Status Former   Packs/day: 0.25   Years: 3.00   Pack years: 0.75   Types: Cigarettes   Quit date: 10/21/1965   Years since quitting: 56.2  Smokeless Tobacco Never    Goals Met:  Independence with exercise equipment Exercise tolerated well No report of concerns or symptoms today Strength training completed today  Goals Unmet:  Not Applicable  Comments: Pt able to follow exercise prescription today without complaint.  Will continue to monitor for progression.    Dr. Emily Filbert is Medical Director for [place].  Dr. Ottie Glazier is Medical Director for [facility] Pulmonary Rehabilitation.

## 2021-12-30 NOTE — Progress Notes (Signed)
Completed initial RD consultation ?

## 2021-12-31 DIAGNOSIS — I480 Paroxysmal atrial fibrillation: Secondary | ICD-10-CM | POA: Diagnosis not present

## 2021-12-31 DIAGNOSIS — Z79899 Other long term (current) drug therapy: Secondary | ICD-10-CM | POA: Diagnosis not present

## 2021-12-31 DIAGNOSIS — I447 Left bundle-branch block, unspecified: Secondary | ICD-10-CM | POA: Diagnosis not present

## 2021-12-31 DIAGNOSIS — I10 Essential (primary) hypertension: Secondary | ICD-10-CM | POA: Diagnosis not present

## 2021-12-31 DIAGNOSIS — E782 Mixed hyperlipidemia: Secondary | ICD-10-CM | POA: Diagnosis not present

## 2021-12-31 DIAGNOSIS — I5033 Acute on chronic diastolic (congestive) heart failure: Secondary | ICD-10-CM | POA: Diagnosis not present

## 2021-12-31 DIAGNOSIS — I2510 Atherosclerotic heart disease of native coronary artery without angina pectoris: Secondary | ICD-10-CM | POA: Diagnosis not present

## 2022-01-01 ENCOUNTER — Other Ambulatory Visit: Payer: Self-pay

## 2022-01-01 DIAGNOSIS — I5032 Chronic diastolic (congestive) heart failure: Secondary | ICD-10-CM | POA: Diagnosis not present

## 2022-01-01 NOTE — Progress Notes (Signed)
Daily Session Note  Patient Details  Name: Kelsey Carter MRN: [id number] Date of Birth: [birth year] Referring Provider:   Flowsheet Row Pulmonary Rehab from 11/27/2021 in [facility] Cardiac and Pulmonary Rehab  Referring Provider Serafina Royals MD       Encounter Date: 01/01/2022  Check In:  Session Check In - 01/01/22 0945       Check-In   Supervising physician immediately available to respond to emergencies See telemetry face sheet for immediately available ER MD    Location [facility]-Cardiac & Pulmonary Rehab    Staff Present Birdie Sons, MPA, RN;Melissa Butner, RDN, LDN;Jessica Hawkins, MA, RCEP, CCRP, CCET    Virtual Visit No    Medication changes reported     Yes    Comments halved dose of amioderone due to dizziness    Fall or balance concerns reported    No    Warm-up and Cool-down Performed on first and last piece of equipment    Resistance Training Performed Yes    VAD Patient? No    PAD/SET Patient? No      Pain Assessment   Currently in Pain? No/denies                Social History   Tobacco Use  Smoking Status Former   Packs/day: 0.25   Years: 3.00   Pack years: 0.75   Types: Cigarettes   Quit date: 10/21/1965   Years since quitting: 56.2  Smokeless Tobacco Never    Goals Met:  Independence with exercise equipment Exercise tolerated well No report of concerns or symptoms today Strength training completed today  Goals Unmet:  Not Applicable  Comments: Pt able to follow exercise prescription today without complaint.  Will continue to monitor for progression.    Dr. Emily Filbert is Medical Director for [place].  Dr. Ottie Glazier is Medical Director for [facility] Pulmonary Rehabilitation.

## 2022-01-06 ENCOUNTER — Other Ambulatory Visit: Payer: Self-pay

## 2022-01-06 DIAGNOSIS — I5032 Chronic diastolic (congestive) heart failure: Secondary | ICD-10-CM | POA: Diagnosis not present

## 2022-01-06 NOTE — Progress Notes (Signed)
Daily Session Note  Patient Details  Name: Kelsey Carter MRN: [id number] Date of Birth: [birth year] Referring Provider:   Flowsheet Row Pulmonary Rehab from 11/27/2021 in [facility] Cardiac and Pulmonary Rehab  Referring Provider Serafina Royals MD       Encounter Date: 01/06/2022  Check In:  Session Check In - 01/06/22 1018       Check-In   Supervising physician immediately available to respond to emergencies See telemetry face sheet for immediately available ER MD    Location [facility]-Cardiac & Pulmonary Rehab    Staff Present Birdie Sons, MPA, RN;Amanda Sommer, BA, ACSM CEP, Exercise Physiologist;Jessica Luan Pulling, MA, RCEP, CCRP, CCET    Virtual Visit No    Medication changes reported     No    Fall or balance concerns reported    No    Warm-up and Cool-down Performed on first and last piece of equipment    Resistance Training Performed Yes    VAD Patient? No    PAD/SET Patient? No      Pain Assessment   Currently in Pain? No/denies                Social History   Tobacco Use  Smoking Status Former   Packs/day: 0.25   Years: 3.00   Pack years: 0.75   Types: Cigarettes   Quit date: 10/21/1965   Years since quitting: 56.2  Smokeless Tobacco Never    Goals Met:  Independence with exercise equipment Exercise tolerated well No report of concerns or symptoms today Strength training completed today  Goals Unmet:  Not Applicable  Comments: Pt able to follow exercise prescription today without complaint.  Will continue to monitor for progression.    Dr. Emily Filbert is Medical Director for [place].  Dr. Ottie Glazier is Medical Director for [facility] Pulmonary Rehabilitation.

## 2022-01-08 ENCOUNTER — Other Ambulatory Visit: Payer: Self-pay

## 2022-01-08 DIAGNOSIS — I5032 Chronic diastolic (congestive) heart failure: Secondary | ICD-10-CM | POA: Diagnosis not present

## 2022-01-08 NOTE — Progress Notes (Signed)
Daily Session Note  Patient Details  Name: Kelsey Carter MRN: [id number] Date of Birth: 27-May-1946 Referring Provider:   Flowsheet Row Pulmonary Rehab from 11/27/2021 in [facility] Rehab  Referring Provider Serafina Royals MD       Encounter Date: 01/08/2022  Check In:  Session Check In - 01/08/22 0942       Check-In   Supervising physician immediately available to respond to emergencies See telemetry face sheet for immediately available ER MD    Location [facility]-Cardiac & Pulmonary Rehab    Staff Present Birdie Sons, MPA, RN;Amanda Sommer, BA, ACSM CEP, Exercise Physiologist;Jessica Luan Pulling, MA, RCEP, CCRP, CCET    Virtual Visit No    Medication changes reported     No    Fall or balance concerns reported    No    Warm-up and Cool-down Performed on first and last piece of equipment    Resistance Training Performed Yes    VAD Patient? No    PAD/SET Patient? No      Pain Assessment   Currently in Pain? No/denies                Social History   Tobacco Use  Smoking Status Former   Packs/day: 0.25   Years: 3.00   Pack years: 0.75   Types: Cigarettes   Quit date: 10/21/1965   Years since quitting: 56.2  Smokeless Tobacco Never    Goals Met:  Independence with exercise equipment Exercise tolerated well No report of concerns or symptoms today Strength training completed today  Goals Unmet:  Not Applicable  Comments: Pt able to follow exercise prescription today without complaint.  Will continue to monitor for progression.    Dr. Emily Filbert is Medical Director for [facility].  Dr. Ottie Glazier is Medical Director for [facility] Pulmonary Rehabilitation.

## 2022-01-13 ENCOUNTER — Other Ambulatory Visit: Payer: Self-pay

## 2022-01-13 ENCOUNTER — Ambulatory Visit: Payer: PPO | Admitting: Family

## 2022-01-13 DIAGNOSIS — I5032 Chronic diastolic (congestive) heart failure: Secondary | ICD-10-CM | POA: Diagnosis not present

## 2022-01-13 NOTE — Progress Notes (Signed)
Daily Session Note  Patient Details  Name: Kelsey Carter MRN: [id number] Date of Birth: 20-Sep-1946 Referring Provider:   Flowsheet Row Pulmonary Rehab from 11/27/2021 in [facility] Cardiac and Pulmonary Rehab  Referring Provider Serafina Royals MD       Encounter Date: 01/13/2022  Check In:  Session Check In - 01/13/22 0936       Check-In   Supervising physician immediately available to respond to emergencies See telemetry face sheet for immediately available ER MD    Location [facility]-Cardiac & Pulmonary Rehab    Staff Present Birdie Sons, MPA, RN;Melissa [place], RDN, LDN;Jessica Luan Pulling, MA, RCEP, CCRP, CCET    Virtual Visit No    Medication changes reported     No    Fall or balance concerns reported    No    Warm-up and Cool-down Performed on first and last piece of equipment    Resistance Training Performed Yes    VAD Patient? No    PAD/SET Patient? No      Pain Assessment   Currently in Pain? No/denies                Social History   Tobacco Use  Smoking Status Former   Packs/day: 0.25   Years: 3.00   Pack years: 0.75   Types: Cigarettes   Quit date: 10/21/1965   Years since quitting: 56.2  Smokeless Tobacco Never    Goals Met:  Independence with exercise equipment Exercise tolerated well No report of concerns or symptoms today Strength training completed today  Goals Unmet:  Not Applicable  Comments: Pt able to follow exercise prescription today without complaint.  Will continue to monitor for progression.    Dr. Emily Filbert is Medical Director for [facility].  Dr. Ottie Glazier is Medical Director for [facility] Pulmonary Rehabilitation.

## 2022-01-14 ENCOUNTER — Encounter: Payer: Self-pay | Admitting: *Deleted

## 2022-01-14 DIAGNOSIS — I5032 Chronic diastolic (congestive) heart failure: Secondary | ICD-10-CM

## 2022-01-14 NOTE — Progress Notes (Signed)
Pulmonary Individual Treatment Plan  Patient Details  Name: KHALIA GONG MRN: [id number] Date of Birth: [birth year] Referring Provider:   Flowsheet Row Pulmonary Rehab from 11/27/2021 in [facility] Cardiac and Pulmonary Rehab  Referring Provider Serafina Royals MD       Initial Encounter Date:  Flowsheet Row Pulmonary Rehab from 11/27/2021 in [facility] Cardiac and Pulmonary Rehab  Date 11/27/21       Visit Diagnosis: Heart failure, diastolic, chronic ([place])  Patient's Home Medications on Admission:  Current Outpatient Medications:    alendronate (FOSAMAX) 70 MG tablet, Take 70 mg by mouth once a week., Disp: , Rfl:    amiodarone (PACERONE) 200 MG tablet, Take 200 mg by mouth 2 (two) times daily., Disp: , Rfl:    atorvastatin (LIPITOR) 10 MG tablet, Take 1 tablet (10 mg total) by mouth daily., Disp: 30 tablet, Rfl: 0   Cholecalciferol (VITAMIN D3 PO), Take 1 tablet by mouth in the morning., Disp: , Rfl:    ELIQUIS 5 MG TABS tablet, Take 5 mg by mouth 2 (two) times daily., Disp: , Rfl:    furosemide (LASIX) 20 MG tablet, Take 1 tablet (20 mg total) by mouth daily., Disp: 30 tablet, Rfl: 1   levothyroxine (SYNTHROID) 25 MCG tablet, Take 25 mcg by mouth in the morning., Disp: , Rfl:    MAGNESIUM PO, Take 1 tablet by mouth in the morning., Disp: , Rfl:    meclizine (ANTIVERT) 12.5 MG tablet, Take 12.5 mg by mouth 3 (three) times daily., Disp: , Rfl:    metoprolol succinate (TOPROL-XL) 25 MG 24 hr tablet, Take 0.5 tablets (12.5 mg total) by mouth 2 (two) times daily., Disp: 30 tablet, Rfl: 0   Potassium Citrate 99 MG CAPS, Take 1 tablet by mouth daily., Disp: , Rfl:   Past Medical History: Past Medical History:  Diagnosis Date   Anemia    vitamin d deficiency   Anxiety    Breast cancer ([place])    bilateral mastectomies.  different types of cancer in each breast   Cardiomyopathy due to chemotherapy [facility]) 2011   Chronic anticoagulation    Apixaban   Chronic pain 2019   Chronic SI joint  pain    Coronary artery disease    DDD (degenerative disc disease), lumbar    First degree AV block    GERD (gastroesophageal reflux disease)    Hyperlipidemia    Hypertension    Insomnia    Lumbar herniated disc    Neuropathy    Paroxysmal atrial fibrillation (HCC)    Personal history of chemotherapy    Personal history of radiation therapy    S/P drug eluting coronary stent placement 08/02/2019   2.5 x 12 mm Resolute Onyx to pLAD   Valvular insufficiency    Mild    Tobacco Use: Social History   Tobacco Use  Smoking Status Former   Packs/day: 0.25   Years: 3.00   Pack years: 0.75   Types: Cigarettes   Quit date: 10/21/1965   Years since quitting: 56.2  Smokeless Tobacco Never    Labs: Recent Review Flowsheet Data     Labs for ITP Cardiac and Pulmonary Rehab Latest Ref Rng & Units 04/24/2013 08/24/2017 03/22/2018 09/21/2018   Cholestrol 0 - 200 mg/dL 160 117 - 130   LDLCALC 0 - 99 mg/dL 87 48 - 59   HDL >40 mg/dL 54 53 - 49   Trlycerides <150 mg/dL 97 82 - 108   Hemoglobin A1c 4.8 - 5.6 % - -  5.9(H) 5.6        Pulmonary Assessment Scores:  Pulmonary Assessment Scores     Row Name 11/27/21 1112         ADL UCSD   ADL Phase Entry     SOB Score total 14     Rest 0     Walk 4     Stairs 4     Bath 0     Dress 0     Shop 1       CAT Score   CAT Score 3       mMRC Score   mMRC Score 1              UCSD: Self-administered rating of dyspnea associated with activities of daily living (ADLs) 6-point scale (0 = "not at all" to 5 = "maximal or unable to do because of breathlessness")  Scoring Scores range from 0 to 120.  Minimally important difference is 5 units  CAT: CAT can identify the health impairment of COPD patients and is better correlated with disease progression.  CAT has a scoring range of zero to 40. The CAT score is classified into four groups of low (less than 10), medium (10 - 20), high (21-30) and very high (31-40) based on the impact  level of disease on health status. A CAT score over 10 suggests significant symptoms.  A worsening CAT score could be explained by an exacerbation, poor medication adherence, poor inhaler technique, or progression of COPD or comorbid conditions.  CAT MCID is 2 points  mMRC: mMRC (Modified Medical Research Council) Dyspnea Scale is used to assess the degree of baseline functional disability in patients of respiratory disease due to dyspnea. No minimal important difference is established. A decrease in score of 1 point or greater is considered a positive change.   Pulmonary Function Assessment:  Pulmonary Function Assessment - 11/17/21 1551       Breath   Shortness of Breath Yes;Limiting activity             Exercise Target Goals: Exercise Program Goal: Individual exercise prescription set using results from initial 6 min walk test and THRR while considering  patients activity barriers and safety.   Exercise Prescription Goal: Initial exercise prescription builds to 30-45 minutes a day of aerobic activity, 2-3 days per week.  Home exercise guidelines will be given to patient during program as part of exercise prescription that the participant will acknowledge.  Education: Aerobic Exercise: - Group verbal and visual presentation on the components of exercise prescription. Introduces F.I.T.T principle from ACSM for exercise prescriptions.  Reviews F.I.T.T. principles of aerobic exercise including progression. Written material given at graduation. Flowsheet Row Pulmonary Rehab from 12/04/2021 in [facility] Cardiac and Pulmonary Rehab  Education need identified 11/27/21       Education: Resistance Exercise: - Group verbal and visual presentation on the components of exercise prescription. Introduces F.I.T.T principle from ACSM for exercise prescriptions  Reviews F.I.T.T. principles of resistance exercise including progression. Written material given at graduation.    Education: Exercise &  Equipment Safety: - Individual verbal instruction and demonstration of equipment use and safety with use of the equipment. Flowsheet Row Pulmonary Rehab from 11/17/2021 in [facility] Cardiac and Pulmonary Rehab  Date 11/17/21  Educator [facility]  Instruction Review Code 1- Verbalizes Understanding       Education: Exercise Physiology & General Exercise Guidelines: - Group verbal and written instruction with models to review the exercise physiology of the cardiovascular system  and associated critical values. Provides general exercise guidelines with specific guidelines to those with heart or lung disease.    Education: Flexibility, Balance, Mind/Body Relaxation: - Group verbal and visual presentation with interactive activity on the components of exercise prescription. Introduces F.I.T.T principle from ACSM for exercise prescriptions. Reviews F.I.T.T. principles of flexibility and balance exercise training including progression. Also discusses the mind body connection.  Reviews various relaxation techniques to help reduce and manage stress (i.e. Deep breathing, progressive muscle relaxation, and visualization). Balance handout provided to take home. Written material given at graduation.   Activity Barriers & Risk Stratification:  Activity Barriers & Cardiac Risk Stratification - 11/27/21 1106       Activity Barriers & Cardiac Risk Stratification   Activity Barriers Balance Concerns;Muscular Weakness;Deconditioning;History of Falls;Other (comment)    Comments Neuropathy bilateral legs             6 Minute Walk:  6 Minute Walk     Row Name 11/27/21 1118         6 Minute Walk   Phase Initial     Distance 1105 feet     Walk Time 6 minutes     # of Rest Breaks 0     MPH 2.09     METS 2.13     RPE 11     Perceived Dyspnea  0     VO2 Peak 7.45     Symptoms No     Resting HR 65 bpm     Resting BP 124/68     Resting Oxygen Saturation  97 %     Exercise Oxygen Saturation  during 6 min  walk 95 %     Max Ex. HR 86 bpm     Max Ex. BP 132/70     2 Minute Post BP 130/68       Interval HR   1 Minute HR 77     2 Minute HR 82     3 Minute HR 85     4 Minute HR 86     5 Minute HR 85     6 Minute HR 83     2 Minute Post HR 66     Interval Heart Rate? Yes       Interval Oxygen   Interval Oxygen? Yes     Baseline Oxygen Saturation % 97 %     1 Minute Oxygen Saturation % 97 %     1 Minute Liters of Oxygen 0 L  RA     2 Minute Oxygen Saturation % 96 %     2 Minute Liters of Oxygen 0 L     3 Minute Oxygen Saturation % 95 %     3 Minute Liters of Oxygen 0 L     4 Minute Oxygen Saturation % 97 %     4 Minute Liters of Oxygen 0 L     5 Minute Oxygen Saturation % 97 %     5 Minute Liters of Oxygen 0 L     6 Minute Oxygen Saturation % 95 %     6 Minute Liters of Oxygen 0 L     2 Minute Post Oxygen Saturation % 98 %     2 Minute Post Liters of Oxygen 0 L             Oxygen Initial Assessment:  Oxygen Initial Assessment - 11/27/21 1112       Home Oxygen   Home Oxygen Device  None    Sleep Oxygen Prescription None    Home Exercise Oxygen Prescription None    Home Resting Oxygen Prescription None    Compliance with Home Oxygen Use Yes      Initial 6 min Walk   Oxygen Used None      Program Oxygen Prescription   Program Oxygen Prescription None      Intervention   Short Term Goals To learn and understand importance of monitoring SPO2 with pulse oximeter and demonstrate accurate use of the pulse oximeter.;To learn and understand importance of maintaining oxygen saturations>88%;To learn and demonstrate proper pursed lip breathing techniques or other breathing techniques. ;To learn and exhibit compliance with exercise, home and travel O2 prescription    Long  Term Goals Exhibits compliance with exercise, home  and travel O2 prescription;Verbalizes importance of monitoring SPO2 with pulse oximeter and return demonstration;Maintenance of O2 saturations>88%;Exhibits  proper breathing techniques, such as pursed lip breathing or other method taught during program session;Compliance with respiratory medication             Oxygen Re-Evaluation:  Oxygen Re-Evaluation     Row Name 12/01/21 0916 12/23/21 1019           Program Oxygen Prescription   Program Oxygen Prescription None None        Home Oxygen   Home Oxygen Device None None      Sleep Oxygen Prescription None None      Home Exercise Oxygen Prescription None None      Home Resting Oxygen Prescription None None      Compliance with Home Oxygen Use Yes Yes        Goals/Expected Outcomes   Short Term Goals To learn and understand importance of monitoring SPO2 with pulse oximeter and demonstrate accurate use of the pulse oximeter.;To learn and understand importance of maintaining oxygen saturations>88%;To learn and demonstrate proper pursed lip breathing techniques or other breathing techniques. ;To learn and exhibit compliance with exercise, home and travel O2 prescription To learn and understand importance of monitoring SPO2 with pulse oximeter and demonstrate accurate use of the pulse oximeter.;To learn and understand importance of maintaining oxygen saturations>88%;To learn and demonstrate proper pursed lip breathing techniques or other breathing techniques.       Long  Term Goals Exhibits compliance with exercise, home  and travel O2 prescription;Verbalizes importance of monitoring SPO2 with pulse oximeter and return demonstration;Maintenance of O2 saturations>88%;Exhibits proper breathing techniques, such as pursed lip breathing or other method taught during program session;Compliance with respiratory medication Verbalizes importance of monitoring SPO2 with pulse oximeter and return demonstration;Maintenance of O2 saturations>88%;Exhibits proper breathing techniques, such as pursed lip breathing or other method taught during program session;Compliance with respiratory medication      Comments  Reviewed PLB technique with pt.  Talked about how it works and it's importance in maintaining their exercise saturations. Jennise is doing well in rehab.She uses her pulse oximeter to keep an eye on her oxygen levels at home, and they have been good.  She is using her PLB and finds it helpful with controling her breathing.      Goals/Expected Outcomes Short: Become more profiecient at using PLB.   Long: Become independent at using PLB. Short: Continue to use PLB daily Long: Continue to monitor saturations.               Oxygen Discharge (Final Oxygen Re-Evaluation):  Oxygen Re-Evaluation - 12/23/21 1019       Program Oxygen Prescription  Program Oxygen Prescription None      Home Oxygen   Home Oxygen Device None    Sleep Oxygen Prescription None    Home Exercise Oxygen Prescription None    Home Resting Oxygen Prescription None    Compliance with Home Oxygen Use Yes      Goals/Expected Outcomes   Short Term Goals To learn and understand importance of monitoring SPO2 with pulse oximeter and demonstrate accurate use of the pulse oximeter.;To learn and understand importance of maintaining oxygen saturations>88%;To learn and demonstrate proper pursed lip breathing techniques or other breathing techniques.     Long  Term Goals Verbalizes importance of monitoring SPO2 with pulse oximeter and return demonstration;Maintenance of O2 saturations>88%;Exhibits proper breathing techniques, such as pursed lip breathing or other method taught during program session;Compliance with respiratory medication    Comments Devan is doing well in rehab.She uses her pulse oximeter to keep an eye on her oxygen levels at home, and they have been good.  She is using her PLB and finds it helpful with controling her breathing.    Goals/Expected Outcomes Short: Continue to use PLB daily Long: Continue to monitor saturations.             Initial Exercise Prescription:  Initial Exercise Prescription - 11/27/21  1100       Date of Initial Exercise RX and Referring Provider   Date 11/27/21    Referring Provider Serafina Royals MD      Oxygen   Maintain Oxygen Saturation 88% or higher      Recumbant Bike   Level 1    RPM 60    Watts 15    Minutes 15    METs 2.1      NuStep   Level 2    SPM 80    Minutes 15    METs 2.1      REL-XR   Level 1    Speed 50    Minutes 15    METs 2.1      Track   Laps 21    Minutes 15    METs 2.14      Prescription Details   Frequency (times per week) 2    Duration Progress to 30 minutes of continuous aerobic without signs/symptoms of physical distress      Intensity   THRR 40-80% of Max Heartrate 96-128    Ratings of Perceived Exertion 11-13    Perceived Dyspnea 0-4      Progression   Progression Continue to progress workloads to maintain intensity without signs/symptoms of physical distress.      Resistance Training   Training Prescription Yes    Weight 3 lb    Reps 10-15             Perform Capillary Blood Glucose checks as needed.  Exercise Prescription Changes:   Exercise Prescription Changes     Row Name 11/27/21 1100 12/02/21 1400 12/17/21 1200 12/30/21 0800 01/12/22 1200     Response to Exercise   Blood Pressure (Admit) 124/68 126/70 122/68 118/64 126/64   Blood Pressure (Exercise) 132/70 138/60 126/64 118/62 --   Blood Pressure (Exit) 130/68 124/65 104/66 94/58 102/62   Heart Rate (Admit) 65 bpm 68 bpm 69 bpm 73 bpm 69 bpm   Heart Rate (Exercise) 86 bpm 92 bpm 85 bpm 89 bpm 80 bpm   Heart Rate (Exit) 66 bpm 79 bpm 68 bpm 79 bpm 71 bpm   Oxygen Saturation (Admit) 97 % 95 %  97 % 96 % 98 %   Oxygen Saturation (Exercise) 95 % 95 % 95 % 94 % 97 %   Oxygen Saturation (Exit) 98 % 95 % 98 % 98 % 98 %   Rating of Perceived Exertion (Exercise) _0 Perceived Dyspnea (Exercise) 0 0 0 0 0   Symptoms _1    Comments walk test results first day -- -- --   Duration -- Progress to 30 minutes of   aerobic without signs/symptoms of physical distress Continue with 30 min of aerobic exercise without signs/symptoms of physical distress. Continue with 30 min of aerobic exercise without signs/symptoms of physical distress. Continue with 30 min of aerobic exercise without signs/symptoms of physical distress.   Intensity -- THRR unchanged THRR unchanged THRR unchanged THRR unchanged     Progression   Progression -- Continue to progress workloads to maintain intensity without signs/symptoms of physical distress. Continue to progress workloads to maintain intensity without signs/symptoms of physical distress. Continue to progress workloads to maintain intensity without signs/symptoms of physical distress. Continue to progress workloads to maintain intensity without signs/symptoms of physical distress.   Average METs -- 2.7 1.96 2.15 2.17     Resistance Training   Training Prescription -- Yes Yes Yes Yes   Weight -- 3 lb 3 lb 3 lb 3 lb   Reps -- 10-15 10-15 10-15 10-15     Interval Training   Interval Training -- -- No No No     Recumbant Bike   Level -- 1 3 -- 3   Watts -- -- -- -- 20   Minutes -- 15 15 -- 15   METs -- 3.18 -- -- 3.16     NuStep   Level -- _2 Minutes -- _3 METs -- 2.2 2 1.5 2.36     T5 Nustep   Level -- -- 2 -- 4   Minutes -- -- 15 -- 15   METs -- -- 1.8 -- 2.1     Track   Laps -- -- _4 Minutes -- -- _5 METs -- -- 2.09 2.14 2.36     Oxygen   Maintain Oxygen Saturation -- -- 88% or higher 88% or higher 88% or higher            Exercise Comments:   Exercise Comments     Row Name 12/01/21 0916           Exercise Comments First full day of exercise!  Patient was oriented to gym and equipment including functions, settings, policies, and procedures.  Patient's individual exercise prescription and treatment plan were reviewed.  All starting workloads were established based on the results of the 6 minute walk test done at  initial orientation visit.  The plan for exercise progression was also introduced and progression will be customized based on patient's performance and goals.                Exercise Goals and Review:   Exercise Goals     Row Name 11/27/21 1138             Exercise Goals   Increase Physical Activity Yes       Intervention Provide advice, education, support and counseling about physical activity/exercise needs.;Develop an individualized exercise prescription for aerobic and resistive training based on initial evaluation findings, risk stratification, comorbidities and participant's  personal goals.       Expected Outcomes Short Term: Attend rehab on a regular basis to increase amount of physical activity.;Long Term: Add in home exercise to make exercise part of routine and to increase amount of physical activity.;Long Term: Exercising regularly at least 3-5 days a week.       Increase Strength and Stamina Yes       Intervention Provide advice, education, support and counseling about physical activity/exercise needs.;Develop an individualized exercise prescription for aerobic and resistive training based on initial evaluation findings, risk stratification, comorbidities and participant's personal goals.       Expected Outcomes Short Term: Increase workloads from initial exercise prescription for resistance, speed, and METs.;Short Term: Perform resistance training exercises routinely during rehab and add in resistance training at home;Long Term: Improve cardiorespiratory fitness, muscular endurance and strength as measured by increased METs and functional capacity (6MWT)       Able to understand and use rate of perceived exertion (RPE) scale Yes       Intervention Provide education and explanation on how to use RPE scale       Expected Outcomes Short Term: Able to use RPE daily in rehab to express subjective intensity level;Long Term:  Able to use RPE to guide intensity level when exercising  independently       Able to understand and use Dyspnea scale Yes       Intervention Provide education and explanation on how to use Dyspnea scale       Expected Outcomes Short Term: Able to use Dyspnea scale daily in rehab to express subjective sense of shortness of breath during exertion;Long Term: Able to use Dyspnea scale to guide intensity level when exercising independently       Knowledge and understanding of Target Heart Rate Range (THRR) Yes       Intervention Provide education and explanation of THRR including how the numbers were predicted and where they are located for reference       Expected Outcomes Short Term: Able to state/look up THRR;Short Term: Able to use daily as guideline for intensity in rehab;Long Term: Able to use THRR to govern intensity when exercising independently       Able to check pulse independently Yes       Intervention Provide education and demonstration on how to check pulse in carotid and radial arteries.;Review the importance of being able to check your own pulse for safety during independent exercise       Expected Outcomes Short Term: Able to explain why pulse checking is important during independent exercise;Long Term: Able to check pulse independently and accurately       Understanding of Exercise Prescription Yes       Intervention Provide education, explanation, and written materials on patient's individual exercise prescription       Expected Outcomes Short Term: Able to explain program exercise prescription;Long Term: Able to explain home exercise prescription to exercise independently                Exercise Goals Re-Evaluation :  Exercise Goals Re-Evaluation     Row Name 12/01/21 0916 12/17/21 1241 12/23/21 1012 12/30/21 0823 12/30/21 0828     Exercise Goal Re-Evaluation   Exercise Goals Review Increase Physical Activity;Able to understand and use rate of perceived exertion (RPE) scale;Knowledge and understanding of Target Heart Rate Range  (THRR);Understanding of Exercise Prescription;Increase Strength and Stamina;Able to understand and use Dyspnea scale;Able to check pulse independently Increase Physical Activity;Increase Strength  and Stamina;Understanding of Exercise Prescription Increase Physical Activity;Increase Strength and Stamina;Understanding of Exercise Prescription;Able to understand and use rate of perceived exertion (RPE) scale;Able to understand and use Dyspnea scale;Knowledge and understanding of Target Heart Rate Range (THRR);Able to check pulse independently Increase Physical Activity;Increase Strength and Stamina --   Comments Reviewed RPE and dyspnea scales, THR and program prescription with pt today.  Pt voiced understanding and was given a copy of goals to take home. Taheera is off to a good start in rehab.  She has completed her first five sessions thus far.  We will continue to monitor her progress. Reviewed home exercise with pt today.  Pt plans to walk at home and continue to go to [facility] twice a week too,  for exercise.  Reviewed THR, pulse, RPE, sign and symptoms, pulse oximetery and when to call 911 or MD.  Also discussed weather considerations and indoor options.  Pt voiced understanding. Maven is up to level 4 on T4 and has reached 21 laps on the track.  Staff reviewed THR range and importance of trying to reach that range. --   Expected Outcomes Short: Use RPE daily to regulate intensity. Long: Follow program prescription in THR. Short: Continue to attend rehab regularly Long: Continue to follow program prescription SHort: Continue to go to [facility] on off days Long: Continue to improve stamina. -- Short: reach THR range Long:  continue to build stamina    Row Name 01/12/22 1246             Exercise Goal Re-Evaluation   Exercise Goals Review Increase Physical Activity;Increase Strength and Stamina       Comments Breklyn is doing well in rehab. She increased to level 4 on the T5 and was able to tolerate 25 laps on  the track! She would benefit from increasing her weights to 4lbs. Will continue to monitor.       Expected Outcomes Short: Increase handweights to 4 lbs Long: Continue to increase overall MET level                Discharge Exercise Prescription (Final Exercise Prescription Changes):  Exercise Prescription Changes - 01/12/22 1200       Response to Exercise   Blood Pressure (Admit) 126/64    Blood Pressure (Exit) 102/62    Heart Rate (Admit) 69 bpm    Heart Rate (Exercise) 80 bpm    Heart Rate (Exit) 71 bpm    Oxygen Saturation (Admit) 98 %    Oxygen Saturation (Exercise) 97 %    Oxygen Saturation (Exit) 98 %    Rating of Perceived Exertion (Exercise) 12    Perceived Dyspnea (Exercise) 0    Symptoms none    Duration Continue with 30 min of aerobic exercise without signs/symptoms of physical distress.    Intensity THRR unchanged      Progression   Progression Continue to progress workloads to maintain intensity without signs/symptoms of physical distress.    Average METs 2.17      Resistance Training   Training Prescription Yes    Weight 3 lb    Reps 10-15      Interval Training   Interval Training No      Recumbant Bike   Level 3    Watts 20    Minutes 15    METs 3.16      NuStep   Level 3    Minutes 15    METs 2.36      T5 Nustep  Level 4    Minutes 15    METs 2.1      Track   Laps 25    Minutes 15    METs 2.36      Oxygen   Maintain Oxygen Saturation 88% or higher             Nutrition:  Target Goals: Understanding of nutrition guidelines, daily intake of sodium '1500mg'$ , cholesterol '200mg'$ , calories 30% from fat and 7% or less from saturated fats, daily to have 5 or more servings of fruits and vegetables.  Education: All About Nutrition: -Group instruction provided by verbal, written material, interactive activities, discussions, models, and posters to present general guidelines for heart healthy nutrition including fat, fiber, MyPlate, the  role of sodium in heart healthy nutrition, utilization of the nutrition label, and utilization of this knowledge for meal planning. Follow up email sent as well. Written material given at graduation. Flowsheet Row Pulmonary Rehab from 12/04/2021 in [facility] Cardiac and Pulmonary Rehab  Education need identified 11/27/21       Biometrics:  Pre Biometrics - 11/27/21 1139       Pre Biometrics   Height 5' 3.2" (1.605 m)    Weight 145 lb (65.8 kg)    BMI (Calculated) 25.53    Single Leg Stand 1.83 seconds              Nutrition Therapy Plan and Nutrition Goals:  Nutrition Therapy & Goals - 12/30/21 0918       Nutrition Therapy   Diet Heart healthy, low Na    Drug/Food Interactions Statins/Certain Fruits    Protein (specify units) 55-60g    Fiber 25 grams    Whole Grain Foods 3 servings    Saturated Fats 12 max. grams    Fruits and Vegetables 8 servings/day    Sodium 2 grams      Personal Nutrition Goals   Nutrition Goal ST: add 1 good source of protein to breakfast or mid morning snack - high protein yogurt, cottage cheese, nuts/seeds, peanut butter, boiled egg LT: meet calorie and protein needs, maintain weight, maintian current heart healthy changes    Comments 76 y.o. F admitted to rehab for heart failure aslo presenting with CAD, HTN, HLD, afib, GERD. PMH includes breast cancer with chemo. Relevant medications include lipitor, vit D, lasix, magnesium, K+.  PYP 78. She tries to eat 2x/day instead of 3 and feels her diet is going well. Breakfast or lunch: Coffee with some cream. English muffin (jelly or apple butter) or cereal (raisin bran or cheerios 2% milk) or oatmeal, if going out to eat will get an egg  Dinner: chicken with vegetables and sometimes mashed potatoes, salads. She will get steak with a salad and sweet potato when going out to eat. Goes out to eat 1x/week. She does not salt her food. She uses olive oil. Snacks: clementines Drinks: water. Mariavictoria reports eating less  due to her lower appetite. She has lost 60 pounds over 4 years. She reports her weight has been steady. Reviewed heart healthy nutrition. Discussed the importance of variety to meet nutritional needs and importance of calories and protein needs being met to help retain lean muscle tissue.      Intervention Plan   Intervention Prescribe, educate and counsel regarding individualized specific dietary modifications aiming towards targeted core components such as weight, hypertension, lipid management, diabetes, heart failure and other comorbidities.    Expected Outcomes Short Term Goal: Understand basic principles of dietary content, such  as calories, fat, sodium, cholesterol and nutrients.;Short Term Goal: A plan has been developed with personal nutrition goals set during dietitian appointment.;Long Term Goal: Adherence to prescribed nutrition plan.             Nutrition Assessments:  MEDIFICTS Score Key: ?70 Need to make dietary changes  40-70 Heart Healthy Diet ? 40 Therapeutic Level Cholesterol Diet  Flowsheet Row Pulmonary Rehab from 11/27/2021 in [facility] Cardiac and Pulmonary Rehab  Picture Your Plate Total Score on Admission 78      Picture Your Plate Scores: <62 Unhealthy dietary pattern with much room for improvement. 41-50 Dietary pattern unlikely to meet recommendations for good health and room for improvement. 51-60 More healthful dietary pattern, with some room for improvement.  >60 Healthy dietary pattern, although there may be some specific behaviors that could be improved.   Nutrition Goals Re-Evaluation:  Nutrition Goals Re-Evaluation     [place] Name 12/23/21 1017             Goals   Comment Will meet with dietitan next week.                Nutrition Goals Discharge (Final Nutrition Goals Re-Evaluation):  Nutrition Goals Re-Evaluation - 12/23/21 1017       Goals   Comment Will meet with dietitan next week.             Psychosocial: Target Goals:  Acknowledge presence or absence of significant depression and/or stress, maximize coping skills, provide positive support system. Participant is able to verbalize types and ability to use techniques and skills needed for reducing stress and depression.   Education: Stress, Anxiety, and Depression - Group verbal and visual presentation to define topics covered.  Reviews how body is impacted by stress, anxiety, and depression.  Also discusses healthy ways to reduce stress and to treat/manage anxiety and depression.  Written material given at graduation.   Education: Sleep Hygiene -Provides group verbal and written instruction about how sleep can affect your health.  Define sleep hygiene, discuss sleep cycles and impact of sleep habits. Review good sleep hygiene tips.    Initial Review & Psychosocial Screening:  Initial Psych Review & Screening - 11/17/21 1553       Initial Review   Current issues with None Identified      Family Dynamics   Good Support System? Yes    Comments She can look to her husband, son and daughter for support. She has a positive outlook on her health. She has done the program once before and is excited to do it again.      Barriers   Psychosocial barriers to participate in program There are no identifiable barriers or psychosocial needs.;The patient should benefit from training in stress management and relaxation.      Screening Interventions   Interventions Encouraged to exercise;To provide support and resources with identified psychosocial needs;Provide feedback about the scores to participant    Expected Outcomes Short Term goal: Utilizing psychosocial counselor, staff and physician to assist with identification of specific Stressors or current issues interfering with healing process. Setting desired goal for each stressor or current issue identified.;Long Term Goal: Stressors or current issues are controlled or eliminated.;Short Term goal: Identification and review  with participant of any Quality of Life or Depression concerns found by scoring the questionnaire.;Long Term goal: The participant improves quality of Life and PHQ9 Scores as seen by post scores and/or verbalization of changes  Quality of Life Scores:  Scores of 19 and below usually indicate a poorer quality of life in these areas.  A difference of  2-3 points is a clinically meaningful difference.  A difference of 2-3 points in the total score of the Quality of Life Index has been associated with significant improvement in overall quality of life, self-image, physical symptoms, and general health in studies assessing change in quality of life.  PHQ-9: Recent Review Flowsheet Data     Depression screen [facility] 2/9 12/02/2021 11/27/2021 01/29/2020 08/24/2019 09/21/2018   Decreased Interest 0 0 0 0 0   Down, Depressed, Hopeless 0 0 0 0 0   PHQ - 2 Score 0 0 0 0 0   Altered sleeping - 0 0 0 0   Tired, decreased energy - 1 0 1 0   Change in appetite - 0 0 0 0   Feeling bad or failure about yourself  - 0 0 0 0   Trouble concentrating - 0 0 0 0   Moving slowly or fidgety/restless - 0 0 0 0   Suicidal thoughts - 0 0 0 0   PHQ-9 Score - 1 0 1 0   Difficult doing work/chores - Not difficult at all - Not difficult at all -      Interpretation of Total Score  Total Score Depression Severity:  1-4 = Minimal depression, 5-9 = Mild depression, 10-14 = Moderate depression, 15-19 = Moderately severe depression, 20-27 = Severe depression   Psychosocial Evaluation and Intervention:  Psychosocial Evaluation - 11/17/21 1554       Psychosocial Evaluation & Interventions   Interventions Encouraged to exercise with the program and follow exercise prescription;Stress management education;Relaxation education    Comments She can look to her husband, son and daughter for support. She has a positive outlook on her health. She has done the program once before and is excited to do it again.    Expected  Outcomes Short: Start [facility] to help with mood. Long: Maintain a healthy mental state.    Continue Psychosocial Services  Follow up required by staff             Psychosocial Re-Evaluation:  Psychosocial Re-Evaluation     [place] Name 12/23/21 1015             Psychosocial Re-Evaluation   Current issues with None Identified       Comments Devona is doing well in rehab.  She is feeling good mentally. She has not had any major stressors and she sleeps well overall.  Now that she is on the diueretic she is no longer SOB at night.  She does get frustrated with her balance from her nueropathy, but she just copes by holding on to things more.       Expected Outcomes Short: Conitnue to sleep well Long:Continue to focus on positive.       Interventions Encouraged to attend Cardiac Rehabilitation for the exercise       Continue Psychosocial Services  Follow up required by staff                Psychosocial Discharge (Final Psychosocial Re-Evaluation):  Psychosocial Re-Evaluation - 12/23/21 1015       Psychosocial Re-Evaluation   Current issues with None Identified    Comments Jamala is doing well in rehab.  She is feeling good mentally. She has not had any major stressors and she sleeps well overall.  Now that she is on the diueretic she  is no longer SOB at night.  She does get frustrated with her balance from her nueropathy, but she just copes by holding on to things more.    Expected Outcomes Short: Conitnue to sleep well Long:Continue to focus on positive.    Interventions Encouraged to attend Cardiac Rehabilitation for the exercise    Continue Psychosocial Services  Follow up required by staff             Education: Education Goals: Education classes will be provided on a weekly basis, covering required topics. Participant will state understanding/return demonstration of topics presented.  Learning Barriers/Preferences:  Learning Barriers/Preferences - 11/17/21 1552        Learning Barriers/Preferences   Learning Barriers None    Learning Preferences None             General Pulmonary Education Topics:  Infection Prevention: - Provides verbal and written material to individual with discussion of infection control including proper hand washing and proper equipment cleaning during exercise session. Flowsheet Row Pulmonary Rehab from 11/17/2021 in [facility] Cardiac and Pulmonary Rehab  Date 11/17/21  Educator [facility]  Instruction Review Code 1- Verbalizes Understanding       Falls Prevention: - Provides verbal and written material to individual with discussion of falls prevention and safety. Flowsheet Row Pulmonary Rehab from 11/17/2021 in [facility] Cardiac and Pulmonary Rehab  Date 11/17/21  Educator [facility]  Instruction Review Code 1- Verbalizes Understanding       Chronic Lung Disease Review: - Group verbal instruction with posters, models, PowerPoint presentations and videos,  to review new updates, new respiratory medications, new advancements in procedures and treatments. Providing information on websites and "800" numbers for continued self-education. Includes information about supplement oxygen, available portable oxygen systems, continuous and intermittent flow rates, oxygen safety, concentrators, and Medicare reimbursement for oxygen. Explanation of Pulmonary Drugs, including class, frequency, complications, importance of spacers, rinsing mouth after steroid MDI's, and proper cleaning methods for nebulizers. Review of basic lung anatomy and physiology related to function, structure, and complications of lung disease. Review of risk factors. Discussion about methods for diagnosing sleep apnea and types of masks and machines for OSA. Includes a review of the use of types of environmental controls: home humidity, furnaces, filters, dust mite/pet prevention, HEPA vacuums. Discussion about weather changes, air quality and the benefits of nasal washing. Instruction on  Warning signs, infection symptoms, calling MD promptly, preventive modes, and value of vaccinations. Review of effective airway clearance, coughing and/or vibration techniques. Emphasizing that all should Create an Action Plan. Written material given at graduation.   AED/CPR: - Group verbal and written instruction with the use of models to demonstrate the basic use of the AED with the basic ABC's of resuscitation.    Anatomy and Cardiac Procedures: - Group verbal and visual presentation and models provide information about basic cardiac anatomy and function. Reviews the testing methods done to diagnose heart disease and the outcomes of the test results. Describes the treatment choices: Medical Management, Angioplasty, or Coronary Bypass Surgery for treating various heart conditions including Myocardial Infarction, Angina, Valve Disease, and Cardiac Arrhythmias.  Written material given at graduation. Flowsheet Row Pulmonary Rehab from 12/04/2021 in [facility] Cardiac and Pulmonary Rehab  Education need identified 11/27/21       Medication Safety: - Group verbal and visual instruction to review commonly prescribed medications for heart and lung disease. Reviews the medication, class of the drug, and side effects. Includes the steps to properly store meds and maintain the prescription  regimen.  Written material given at graduation.   Other: -Provides group and verbal instruction on various topics (see comments)   Knowledge Questionnaire Score:  Knowledge Questionnaire Score - 11/27/21 1112       Knowledge Questionnaire Score   Pre Score 23/26: Angina, Nutrition, Exercise              Core Components/Risk Factors/Patient Goals at Admission:  Personal Goals and Risk Factors at Admission - 11/27/21 1140       Core Components/Risk Factors/Patient Goals on Admission    Weight Management Yes;Weight Maintenance   Patient already lost weight and wants to maintain   Intervention Weight  Management: Develop a combined nutrition and exercise program designed to reach desired caloric intake, while maintaining appropriate intake of nutrient and fiber, sodium and fats, and appropriate energy expenditure required for the weight goal.;Weight Management: Provide education and appropriate resources to help participant work on and attain dietary goals.;Weight Management/Obesity: Establish reasonable short term and long term weight goals.    Admit Weight 145 lb (65.8 kg)    Goal Weight: Short Term 145 lb (65.8 kg)    Goal Weight: Long Term 145 lb (65.8 kg)    Expected Outcomes Short Term: Continue to assess and modify interventions until short term weight is achieved;Long Term: Adherence to nutrition and physical activity/exercise program aimed toward attainment of established weight goal;Understanding recommendations for meals to include 15-35% energy as protein, 25-35% energy from fat, 35-60% energy from carbohydrates, less than 279m of dietary cholesterol, 20-35 gm of total fiber daily;Understanding of distribution of calorie intake throughout the day with the consumption of 4-5 meals/snacks;Weight Maintenance: Understanding of the daily nutrition guidelines, which includes 25-35% calories from fat, 7% or less cal from saturated fats, less than 2077mcholesterol, less than 1.5gm of sodium, & 5 or more servings of fruits and vegetables daily    Improve shortness of breath with ADL's Yes    Intervention Provide education, individualized exercise plan and daily activity instruction to help decrease symptoms of SOB with activities of daily living.    Expected Outcomes Short Term: Improve cardiorespiratory fitness to achieve a reduction of symptoms when performing ADLs;Long Term: Be able to perform more ADLs without symptoms or delay the onset of symptoms    Increase knowledge of respiratory medications and ability to use respiratory devices properly  Yes    Intervention Provide education and  demonstration as needed of appropriate use of medications, inhalers, and oxygen therapy.    Expected Outcomes Short Term: Achieves understanding of medications use. Understands that oxygen is a medication prescribed by physician. Demonstrates appropriate use of inhaler and oxygen therapy.;Long Term: Maintain appropriate use of medications, inhalers, and oxygen therapy.    Hypertension Yes    Intervention Provide education on lifestyle modifcations including regular physical activity/exercise, weight management, moderate sodium restriction and increased consumption of fresh fruit, vegetables, and low fat dairy, alcohol moderation, and smoking cessation.;Monitor prescription use compliance.    Expected Outcomes Short Term: Continued assessment and intervention until BP is < 140/9026mG in hypertensive participants. < 130/5m38m in hypertensive participants with diabetes, heart failure or chronic kidney disease.;Long Term: Maintenance of blood pressure at goal levels.    Lipids Yes    Intervention Provide education and support for participant on nutrition & aerobic/resistive exercise along with prescribed medications to achieve LDL <70mg60mL >40mg.54mExpected Outcomes Short Term: Participant states understanding of desired cholesterol values and is compliant with medications prescribed. Participant is following  exercise prescription and nutrition guidelines.;Long Term: Cholesterol controlled with medications as prescribed, with individualized exercise RX and with personalized nutrition plan. Value goals: LDL < 29m, HDL > 40 mg.             Education:Diabetes - Individual verbal and written instruction to review signs/symptoms of diabetes, desired ranges of glucose level fasting, after meals and with exercise. Acknowledge that pre and post exercise glucose checks will be done for 3 sessions at entry of program.   Know Your Numbers and Heart Failure: - Group verbal and visual instruction to  discuss disease risk factors for cardiac and pulmonary disease and treatment options.  Reviews associated critical values for Overweight/Obesity, Hypertension, Cholesterol, and Diabetes.  Discusses basics of heart failure: signs/symptoms and treatments.  Introduces Heart Failure Zone chart for action plan for heart failure.  Written material given at graduation.   Core Components/Risk Factors/Patient Goals Review:   Goals and Risk Factor Review     [place] Name 12/23/21 1017             Core Components/Risk Factors/Patient Goals Review   Personal Goals Review Weight Management/Obesity;Improve shortness of breath with ADL's;Lipids;Hypertension       Review MCharis doing well in rehab.  Her weight is staying steady, usually within a pound or two most days.  She takes her dieuretic to help with her breathing and feels better there too.  Her blood pressures are doing well.       Expected Outcomes Short: continue to work on breathing Long: continue to montior risk factors.                Core Components/Risk Factors/Patient Goals at Discharge (Final Review):   Goals and Risk Factor Review - 12/23/21 1017       Core Components/Risk Factors/Patient Goals Review   Personal Goals Review Weight Management/Obesity;Improve shortness of breath with ADL's;Lipids;Hypertension    Review MSokhnais doing well in rehab.  Her weight is staying steady, usually within a pound or two most days.  She takes her dieuretic to help with her breathing and feels better there too.  Her blood pressures are doing well.    Expected Outcomes Short: continue to work on breathing Long: continue to montior risk factors.             ITP Comments:  ITP Comments     Row Name 11/17/21 1550 11/27/21 1058 12/01/21 0915 12/17/21 0646 12/30/21 0938   ITP Comments Virtual Visit completed. Patient informed on EP and RD appointment and 6 Minute walk test. Patient also informed of patient health questionnaires on My Chart.  Patient Verbalizes understanding. [facility] Completed 6MWT and gym orientation. Initial ITP created and sent for review to Dr. FOttie Glazier, Medical Director. First full day of exercise!  Patient was oriented to gym and equipment including functions, settings, policies, and procedures.  Patient's individual exercise prescription and treatment plan were reviewed.  All starting workloads were established based on the results of the 6 minute walk test done at initial orientation visit.  The plan for exercise progression was also introduced and progression will be customized based on patient's performance and goals. 30 Day review completed. Medical Director ITP review done, changes made as directed, and signed approval by Medical Director. Completed initial RD consultation    RF[place]Name 01/14/22 0822           ITP Comments 30 Day review completed. Medical Director ITP  review done, changes made as directed, and signed approval by Medical Director.                Comments:

## 2022-01-15 ENCOUNTER — Other Ambulatory Visit: Payer: Self-pay

## 2022-01-15 DIAGNOSIS — I5032 Chronic diastolic (congestive) heart failure: Secondary | ICD-10-CM | POA: Diagnosis not present

## 2022-01-15 NOTE — Progress Notes (Signed)
Daily Session Note  Patient Details  Name: Kelsey Carter MRN: [id number] Date of Birth: 02/13/1946 Referring Provider:   Flowsheet Row Pulmonary Rehab from 11/27/2021 in [facility] Cardiac and Pulmonary Rehab  Referring Provider Serafina Royals MD       Encounter Date: 01/15/2022  Check In:  Session Check In - 01/15/22 1000       Check-In   Supervising physician immediately available to respond to emergencies See telemetry face sheet for immediately available ER MD    Location [facility]-Cardiac & Pulmonary Rehab    Staff Present Birdie Sons, MPA, Mauricia Area, BS, ACSM CEP, Exercise Physiologist;Amanda Oletta Darter, BA, ACSM CEP, Exercise Physiologist    Virtual Visit No    Medication changes reported     No    Fall or balance concerns reported    No    Warm-up and Cool-down Performed on first and last piece of equipment    Resistance Training Performed Yes    VAD Patient? No    PAD/SET Patient? No      Pain Assessment   Currently in Pain? No/denies                Social History   Tobacco Use  Smoking Status Former   Packs/day: 0.25   Years: 3.00   Pack years: 0.75   Types: Cigarettes   Quit date: 10/21/1965   Years since quitting: 56.2  Smokeless Tobacco Never    Goals Met:  Independence with exercise equipment Exercise tolerated well No report of concerns or symptoms today Strength training completed today  Goals Unmet:  Not Applicable  Comments: Pt able to follow exercise prescription today without complaint.  Will continue to monitor for progression.    Dr. Emily Filbert is Medical Director for [place].  Dr. Ottie Glazier is Medical Director for [facility] Pulmonary Rehabilitation.

## 2022-01-20 ENCOUNTER — Other Ambulatory Visit: Payer: Self-pay

## 2022-01-20 DIAGNOSIS — I5032 Chronic diastolic (congestive) heart failure: Secondary | ICD-10-CM | POA: Diagnosis not present

## 2022-01-20 NOTE — Progress Notes (Signed)
Daily Session Note  Patient Details  Name: Kelsey Carter MRN: [id number] Date of Birth: Jun 16, 1946 Referring Provider:   Flowsheet Row Pulmonary Rehab from 11/27/2021 in [facility] Cardiac and Pulmonary Rehab  Referring Provider Serafina Royals MD       Encounter Date: 01/20/2022  Check In:  Session Check In - 01/20/22 0951       Check-In   Supervising physician immediately available to respond to emergencies See telemetry face sheet for immediately available ER MD    Location [facility]-Cardiac & Pulmonary Rehab    Staff Present Birdie Sons, MPA, RN;Jessica Luan Pulling, MA, RCEP, CCRP, CCET;Amanda Sommer, BA, ACSM CEP, Exercise Physiologist    Virtual Visit No    Medication changes reported     No    Fall or balance concerns reported    No    Warm-up and Cool-down Performed on first and last piece of equipment    Resistance Training Performed Yes    VAD Patient? No    PAD/SET Patient? No      Pain Assessment   Currently in Pain? No/denies                Social History   Tobacco Use  Smoking Status Former   Packs/day: 0.25   Years: 3.00   Pack years: 0.75   Types: Cigarettes   Quit date: 10/21/1965   Years since quitting: 56.2  Smokeless Tobacco Never    Goals Met:  Independence with exercise equipment Exercise tolerated well No report of concerns or symptoms today Strength training completed today  Goals Unmet:  Not Applicable  Comments: Pt able to follow exercise prescription today without complaint.  Will continue to monitor for progression.    Dr. Emily Filbert is Medical Director for [place].  Dr. Ottie Glazier is Medical Director for [facility] Pulmonary Rehabilitation.

## 2022-01-22 ENCOUNTER — Other Ambulatory Visit: Payer: Self-pay

## 2022-01-22 ENCOUNTER — Encounter: Payer: PPO | Attending: Internal Medicine

## 2022-01-22 DIAGNOSIS — I5032 Chronic diastolic (congestive) heart failure: Secondary | ICD-10-CM | POA: Insufficient documentation

## 2022-01-22 DIAGNOSIS — Z5189 Encounter for other specified aftercare: Secondary | ICD-10-CM | POA: Insufficient documentation

## 2022-01-22 NOTE — Progress Notes (Signed)
Daily Session Note  Patient Details  Name: Kelsey Carter MRN: [id number] Date of Birth: 08-10-1946 Referring Provider:   Flowsheet Row Pulmonary Rehab from 11/27/2021 in [facility] Cardiac and Pulmonary Rehab  Referring Provider Serafina Royals MD       Encounter Date: 01/22/2022  Check In:  Session Check In - 01/22/22 0947       Check-In   Supervising physician immediately available to respond to emergencies See telemetry face sheet for immediately available ER MD    Location [facility]-Cardiac & Pulmonary Rehab    Staff Present Birdie Sons, MPA, Elveria Rising, BA, ACSM CEP, Exercise Physiologist;Krystie Leiter Amedeo Plenty, BS, ACSM CEP, Exercise Physiologist    Virtual Visit No    Medication changes reported     No    Fall or balance concerns reported    No    Warm-up and Cool-down Performed on first and last piece of equipment    Resistance Training Performed Yes    VAD Patient? No    PAD/SET Patient? No      Pain Assessment   Currently in Pain? No/denies                Social History   Tobacco Use  Smoking Status Former   Packs/day: 0.25   Years: 3.00   Pack years: 0.75   Types: Cigarettes   Quit date: 10/21/1965   Years since quitting: 56.2  Smokeless Tobacco Never    Goals Met:  Independence with exercise equipment Exercise tolerated well No report of concerns or symptoms today Strength training completed today  Goals Unmet:  Not Applicable  Comments: Pt able to follow exercise prescription today without complaint.  Will continue to monitor for progression.    Dr. Emily Filbert is Medical Director for [place].  Dr. Ottie Glazier is Medical Director for [facility] Pulmonary Rehabilitation.

## 2022-01-27 ENCOUNTER — Other Ambulatory Visit: Payer: Self-pay

## 2022-01-27 DIAGNOSIS — I5032 Chronic diastolic (congestive) heart failure: Secondary | ICD-10-CM

## 2022-01-27 DIAGNOSIS — Z5189 Encounter for other specified aftercare: Secondary | ICD-10-CM | POA: Diagnosis not present

## 2022-01-27 NOTE — Progress Notes (Signed)
Daily Session Note  Patient Details  Name: Kelsey Carter MRN: [id number] Date of Birth: Nov 25, 1946 Referring Provider:   Flowsheet Row Pulmonary Rehab from 11/27/2021 in [facility] Cardiac and Pulmonary Rehab  Referring Provider Serafina Royals MD       Encounter Date: 01/27/2022  Check In:  Session Check In - 01/27/22 0934       Check-In   Supervising physician immediately available to respond to emergencies See telemetry face sheet for immediately available ER MD    Location [facility]-Cardiac & Pulmonary Rehab    Staff Present Birdie Sons, MPA, RN;Jessica Luan Pulling, MA, RCEP, CCRP, CCET;Amanda Sommer, BA, ACSM CEP, Exercise Physiologist    Virtual Visit No    Medication changes reported     No    Fall or balance concerns reported    No    Warm-up and Cool-down Performed on first and last piece of equipment    Resistance Training Performed Yes    VAD Patient? No    PAD/SET Patient? No      Pain Assessment   Currently in Pain? No/denies                Social History   Tobacco Use  Smoking Status Former   Packs/day: 0.25   Years: 3.00   Pack years: 0.75   Types: Cigarettes   Quit date: 10/21/1965   Years since quitting: 56.3  Smokeless Tobacco Never    Goals Met:  Independence with exercise equipment Exercise tolerated well No report of concerns or symptoms today Strength training completed today  Goals Unmet:  Not Applicable  Comments: Pt able to follow exercise prescription today without complaint.  Will continue to monitor for progression.    Dr. Emily Filbert is Medical Director for [place].  Dr. Ottie Glazier is Medical Director for [facility] Pulmonary Rehabilitation.

## 2022-01-29 ENCOUNTER — Other Ambulatory Visit: Payer: Self-pay

## 2022-01-29 DIAGNOSIS — Z5189 Encounter for other specified aftercare: Secondary | ICD-10-CM | POA: Diagnosis not present

## 2022-01-29 DIAGNOSIS — I5032 Chronic diastolic (congestive) heart failure: Secondary | ICD-10-CM

## 2022-01-29 NOTE — Progress Notes (Signed)
Daily Session Note  Patient Details  Name: Kelsey Carter MRN: [id number] Date of Birth: [birth year] Referring Provider:   Flowsheet Row Pulmonary Rehab from 11/27/2021 in [facility] Cardiac and Pulmonary Rehab  Referring Provider Serafina Royals MD       Encounter Date: 01/29/2022  Check In:  Session Check In - 01/29/22 1004       Check-In   Supervising physician immediately available to respond to emergencies See telemetry face sheet for immediately available ER MD    Location [facility]-Cardiac & Pulmonary Rehab    Staff Present Birdie Sons, MPA, RN;Melissa [place], RDN, Rowe Pavy, BA, ACSM CEP, Exercise Physiologist;Floy Riegler Amedeo Plenty, BS, ACSM CEP, Exercise Physiologist    Virtual Visit No    Medication changes reported     No    Fall or balance concerns reported    No    Warm-up and Cool-down Performed on first and last piece of equipment    Resistance Training Performed Yes    VAD Patient? No    PAD/SET Patient? No      Pain Assessment   Currently in Pain? No/denies                Social History   Tobacco Use  Smoking Status Former   Packs/day: 0.25   Years: 3.00   Pack years: 0.75   Types: Cigarettes   Quit date: 10/21/1965   Years since quitting: 56.3  Smokeless Tobacco Never    Goals Met:  Independence with exercise equipment Exercise tolerated well No report of concerns or symptoms today Strength training completed today  Goals Unmet:  Not Applicable  Comments: Pt able to follow exercise prescription today without complaint.  Will continue to monitor for progression.    Dr. Emily Filbert is Medical Director for [place].  Dr. Ottie Glazier is Medical Director for [facility] Pulmonary Rehabilitation.

## 2022-02-03 ENCOUNTER — Other Ambulatory Visit: Payer: Self-pay

## 2022-02-03 DIAGNOSIS — I5032 Chronic diastolic (congestive) heart failure: Secondary | ICD-10-CM

## 2022-02-03 DIAGNOSIS — Z5189 Encounter for other specified aftercare: Secondary | ICD-10-CM | POA: Diagnosis not present

## 2022-02-03 NOTE — Progress Notes (Signed)
Daily Session Note  Patient Details  Name: Kelsey Carter MRN: [id number] Date of Birth: 06-21-1946 Referring Provider:   Flowsheet Row Pulmonary Rehab from 11/27/2021 in [facility] Cardiac and Pulmonary Rehab  Referring Provider Serafina Royals MD       Encounter Date: 02/03/2022  Check In:  Session Check In - 02/03/22 0952       Check-In   Supervising physician immediately available to respond to emergencies See telemetry face sheet for immediately available ER MD    Location [facility]-Cardiac & Pulmonary Rehab    Staff Present Birdie Sons, MPA, RN;Amanda Sommer, BA, ACSM CEP, Exercise Physiologist;Jessica Luan Pulling, MA, RCEP, CCRP, CCET    Virtual Visit No    Medication changes reported     No    Fall or balance concerns reported    No    Warm-up and Cool-down Performed on first and last piece of equipment    Resistance Training Performed Yes    VAD Patient? No    PAD/SET Patient? No      Pain Assessment   Currently in Pain? No/denies                Social History   Tobacco Use  Smoking Status Former   Packs/day: 0.25   Years: 3.00   Pack years: 0.75   Types: Cigarettes   Quit date: 10/21/1965   Years since quitting: 56.3  Smokeless Tobacco Never    Goals Met:  Independence with exercise equipment Exercise tolerated well No report of concerns or symptoms today Strength training completed today  Goals Unmet:  Not Applicable  Comments: Pt able to follow exercise prescription today without complaint.  Will continue to monitor for progression.    Dr. Emily Filbert is Medical Director for [place].  Dr. Ottie Glazier is Medical Director for [facility] Pulmonary Rehabilitation.

## 2022-02-05 ENCOUNTER — Other Ambulatory Visit: Payer: Self-pay

## 2022-02-05 DIAGNOSIS — I5032 Chronic diastolic (congestive) heart failure: Secondary | ICD-10-CM

## 2022-02-05 DIAGNOSIS — Z5189 Encounter for other specified aftercare: Secondary | ICD-10-CM | POA: Diagnosis not present

## 2022-02-05 NOTE — Progress Notes (Signed)
Daily Session Note  Patient Details  Name: Kelsey Carter MRN: [id number] Date of Birth: May 13, 1946 Referring Provider:   Flowsheet Row Pulmonary Rehab from 11/27/2021 in [facility] Cardiac and Pulmonary Rehab  Referring Provider Serafina Royals MD       Encounter Date: 02/05/2022  Check In:  Session Check In - 02/05/22 0945       Check-In   Supervising physician immediately available to respond to emergencies See telemetry face sheet for immediately available ER MD    Location [facility]-Cardiac & Pulmonary Rehab    Staff Present Birdie Sons, MPA, Nino Glow, MS, ASCM CEP, Exercise Physiologist;Melissa Schoenchen, RDN, Rowe Pavy, BA, ACSM CEP, Exercise Physiologist    Virtual Visit No    Medication changes reported     No    Fall or balance concerns reported    No    Warm-up and Cool-down Performed on first and last piece of equipment    Resistance Training Performed Yes    VAD Patient? No    PAD/SET Patient? No      Pain Assessment   Currently in Pain? No/denies                Social History   Tobacco Use  Smoking Status Former   Packs/day: 0.25   Years: 3.00   Pack years: 0.75   Types: Cigarettes   Quit date: 10/21/1965   Years since quitting: 56.3  Smokeless Tobacco Never    Goals Met:  Independence with exercise equipment Exercise tolerated well No report of concerns or symptoms today Strength training completed today  Goals Unmet:  Not Applicable  Comments: Pt able to follow exercise prescription today without complaint.  Will continue to monitor for progression.    Dr. Emily Filbert is Medical Director for [place].  Dr. Ottie Glazier is Medical Director for [facility] Pulmonary Rehabilitation.

## 2022-02-10 ENCOUNTER — Other Ambulatory Visit: Payer: Self-pay

## 2022-02-10 DIAGNOSIS — Z5189 Encounter for other specified aftercare: Secondary | ICD-10-CM | POA: Diagnosis not present

## 2022-02-10 DIAGNOSIS — I5032 Chronic diastolic (congestive) heart failure: Secondary | ICD-10-CM

## 2022-02-10 NOTE — Progress Notes (Signed)
Daily Session Note  Patient Details  Name: Kelsey Carter MRN: [id number] Date of Birth: [birth year] Referring Provider:   Flowsheet Row Pulmonary Rehab from 11/27/2021 in [facility] Cardiac and Pulmonary Rehab  Referring Provider Serafina Royals MD       Encounter Date: 02/10/2022  Check In:  Session Check In - 02/10/22 0943       Check-In   Supervising physician immediately available to respond to emergencies See telemetry face sheet for immediately available ER MD    Location [facility]-Cardiac & Pulmonary Rehab    Staff Present Birdie Sons, MPA, RN;Jessica Luan Pulling, MA, RCEP, CCRP, CCET;Amanda Sommer, BA, ACSM CEP, Exercise Physiologist    Virtual Visit No    Medication changes reported     No    Fall or balance concerns reported    No    Warm-up and Cool-down Performed on first and last piece of equipment    Resistance Training Performed Yes    VAD Patient? No    PAD/SET Patient? No      Pain Assessment   Currently in Pain? No/denies                Social History   Tobacco Use  Smoking Status Former   Packs/day: 0.25   Years: 3.00   Pack years: 0.75   Types: Cigarettes   Quit date: 10/21/1965   Years since quitting: 56.3  Smokeless Tobacco Never    Goals Met:  Independence with exercise equipment Exercise tolerated well No report of concerns or symptoms today Strength training completed today  Goals Unmet:  Not Applicable  Comments: Pt able to follow exercise prescription today without complaint.  Will continue to monitor for progression.    Dr. Emily Filbert is Medical Director for [place].  Dr. Ottie Glazier is Medical Director for [facility] Pulmonary Rehabilitation.

## 2022-02-11 ENCOUNTER — Encounter: Payer: Self-pay | Admitting: *Deleted

## 2022-02-11 DIAGNOSIS — I5032 Chronic diastolic (congestive) heart failure: Secondary | ICD-10-CM

## 2022-02-11 NOTE — Progress Notes (Signed)
Pulmonary Individual Treatment Plan  Patient Details  Name: Kelsey Carter MRN: [id number] Date of Birth: 1946-01-24 Referring Provider:   Flowsheet Row Pulmonary Rehab from 11/27/2021 in [facility] Cardiac and Pulmonary Rehab  Referring Provider Serafina Royals MD       Initial Encounter Date:  Flowsheet Row Pulmonary Rehab from 11/27/2021 in [facility] Cardiac and Pulmonary Rehab  Date 11/27/21       Visit Diagnosis: Heart failure, diastolic, chronic ([place])  Patient's Home Medications on Admission:  Current Outpatient Medications:    alendronate (FOSAMAX) 70 MG tablet, Take 70 mg by mouth once a week., Disp: , Rfl:    amiodarone (PACERONE) 200 MG tablet, Take 200 mg by mouth 2 (two) times daily., Disp: , Rfl:    atorvastatin (LIPITOR) 10 MG tablet, Take 1 tablet (10 mg total) by mouth daily., Disp: 30 tablet, Rfl: 0   Cholecalciferol (VITAMIN D3 PO), Take 1 tablet by mouth in the morning., Disp: , Rfl:    ELIQUIS 5 MG TABS tablet, Take 5 mg by mouth 2 (two) times daily., Disp: , Rfl:    furosemide (LASIX) 20 MG tablet, Take 1 tablet (20 mg total) by mouth daily., Disp: 30 tablet, Rfl: 1   levothyroxine (SYNTHROID) 25 MCG tablet, Take 25 mcg by mouth in the morning., Disp: , Rfl:    MAGNESIUM PO, Take 1 tablet by mouth in the morning., Disp: , Rfl:    meclizine (ANTIVERT) 12.5 MG tablet, Take 12.5 mg by mouth 3 (three) times daily., Disp: , Rfl:    metoprolol succinate (TOPROL-XL) 25 MG 24 hr tablet, Take 0.5 tablets (12.5 mg total) by mouth 2 (two) times daily., Disp: 30 tablet, Rfl: 0   Potassium Citrate 99 MG CAPS, Take 1 tablet by mouth daily., Disp: , Rfl:   Past Medical History: Past Medical History:  Diagnosis Date   Anemia    vitamin d deficiency   Anxiety    Breast cancer ([place])    bilateral mastectomies.  different types of cancer in each breast   Cardiomyopathy due to chemotherapy [facility]) 2011   Chronic anticoagulation    Apixaban   Chronic pain 2019   Chronic SI joint  pain    Coronary artery disease    DDD (degenerative disc disease), lumbar    First degree AV block    GERD (gastroesophageal reflux disease)    Hyperlipidemia    Hypertension    Insomnia    Lumbar herniated disc    Neuropathy    Paroxysmal atrial fibrillation (HCC)    Personal history of chemotherapy    Personal history of radiation therapy    S/P drug eluting coronary stent placement 08/02/2019   2.5 x 12 mm Resolute Onyx to pLAD   Valvular insufficiency    Mild    Tobacco Use: Social History   Tobacco Use  Smoking Status Former   Packs/day: 0.25   Years: 3.00   Pack years: 0.75   Types: Cigarettes   Quit date: 10/21/1965   Years since quitting: 56.3  Smokeless Tobacco Never    Labs: Recent Review Flowsheet Data     Labs for ITP Cardiac and Pulmonary Rehab Latest Ref Rng & Units 04/24/2013 08/24/2017 03/22/2018 09/21/2018   Cholestrol 0 - 200 mg/dL 160 117 - 130   LDLCALC 0 - 99 mg/dL 87 48 - 59   HDL >40 mg/dL 54 53 - 49   Trlycerides <150 mg/dL 97 82 - 108   Hemoglobin A1c 4.8 - 5.6 % - -  5.9(H) 5.6        Pulmonary Assessment Scores:  Pulmonary Assessment Scores     Row Name 11/27/21 1112         ADL UCSD   ADL Phase Entry     SOB Score total 14     Rest 0     Walk 4     Stairs 4     Bath 0     Dress 0     Shop 1       CAT Score   CAT Score 3       mMRC Score   mMRC Score 1              UCSD: Self-administered rating of dyspnea associated with activities of daily living (ADLs) 6-point scale (0 = "not at all" to 5 = "maximal or unable to do because of breathlessness")  Scoring Scores range from 0 to 120.  Minimally important difference is 5 units  CAT: CAT can identify the health impairment of COPD patients and is better correlated with disease progression.  CAT has a scoring range of zero to 40. The CAT score is classified into four groups of low (less than 10), medium (10 - 20), high (21-30) and very high (31-40) based on the impact  level of disease on health status. A CAT score over 10 suggests significant symptoms.  A worsening CAT score could be explained by an exacerbation, poor medication adherence, poor inhaler technique, or progression of COPD or comorbid conditions.  CAT MCID is 2 points  mMRC: mMRC (Modified Medical Research Council) Dyspnea Scale is used to assess the degree of baseline functional disability in patients of respiratory disease due to dyspnea. No minimal important difference is established. A decrease in score of 1 point or greater is considered a positive change.   Pulmonary Function Assessment:  Pulmonary Function Assessment - 11/17/21 1551       Breath   Shortness of Breath Yes;Limiting activity             Exercise Target Goals: Exercise Program Goal: Individual exercise prescription set using results from initial 6 min walk test and THRR while considering  patients activity barriers and safety.   Exercise Prescription Goal: Initial exercise prescription builds to 30-45 minutes a day of aerobic activity, 2-3 days per week.  Home exercise guidelines will be given to patient during program as part of exercise prescription that the participant will acknowledge.  Education: Aerobic Exercise: - Group verbal and visual presentation on the components of exercise prescription. Introduces F.I.T.T principle from ACSM for exercise prescriptions.  Reviews F.I.T.T. principles of aerobic exercise including progression. Written material given at graduation. Flowsheet Row Pulmonary Rehab from 12/04/2021 in [facility] Cardiac and Pulmonary Rehab  Education need identified 11/27/21       Education: Resistance Exercise: - Group verbal and visual presentation on the components of exercise prescription. Introduces F.I.T.T principle from ACSM for exercise prescriptions  Reviews F.I.T.T. principles of resistance exercise including progression. Written material given at graduation.    Education: Exercise &  Equipment Safety: - Individual verbal instruction and demonstration of equipment use and safety with use of the equipment. Flowsheet Row Pulmonary Rehab from 11/17/2021 in [facility] Cardiac and Pulmonary Rehab  Date 11/17/21  Educator [facility]  Instruction Review Code 1- Verbalizes Understanding       Education: Exercise Physiology & General Exercise Guidelines: - Group verbal and written instruction with models to review the exercise physiology of the cardiovascular system  and associated critical values. Provides general exercise guidelines with specific guidelines to those with heart or lung disease.    Education: Flexibility, Balance, Mind/Body Relaxation: - Group verbal and visual presentation with interactive activity on the components of exercise prescription. Introduces F.I.T.T principle from ACSM for exercise prescriptions. Reviews F.I.T.T. principles of flexibility and balance exercise training including progression. Also discusses the mind body connection.  Reviews various relaxation techniques to help reduce and manage stress (i.e. Deep breathing, progressive muscle relaxation, and visualization). Balance handout provided to take home. Written material given at graduation.   Activity Barriers & Risk Stratification:  Activity Barriers & Cardiac Risk Stratification - 11/27/21 1106       Activity Barriers & Cardiac Risk Stratification   Activity Barriers Balance Concerns;Muscular Weakness;Deconditioning;History of Falls;Other (comment)    Comments Neuropathy bilateral legs             6 Minute Walk:  6 Minute Walk     Row Name 11/27/21 1118         6 Minute Walk   Phase Initial     Distance 1105 feet     Walk Time 6 minutes     # of Rest Breaks 0     MPH 2.09     METS 2.13     RPE 11     Perceived Dyspnea  0     VO2 Peak 7.45     Symptoms No     Resting HR 65 bpm     Resting BP 124/68     Resting Oxygen Saturation  97 %     Exercise Oxygen Saturation  during 6 min  walk 95 %     Max Ex. HR 86 bpm     Max Ex. BP 132/70     2 Minute Post BP 130/68       Interval HR   1 Minute HR 77     2 Minute HR 82     3 Minute HR 85     4 Minute HR 86     5 Minute HR 85     6 Minute HR 83     2 Minute Post HR 66     Interval Heart Rate? Yes       Interval Oxygen   Interval Oxygen? Yes     Baseline Oxygen Saturation % 97 %     1 Minute Oxygen Saturation % 97 %     1 Minute Liters of Oxygen 0 L  RA     2 Minute Oxygen Saturation % 96 %     2 Minute Liters of Oxygen 0 L     3 Minute Oxygen Saturation % 95 %     3 Minute Liters of Oxygen 0 L     4 Minute Oxygen Saturation % 97 %     4 Minute Liters of Oxygen 0 L     5 Minute Oxygen Saturation % 97 %     5 Minute Liters of Oxygen 0 L     6 Minute Oxygen Saturation % 95 %     6 Minute Liters of Oxygen 0 L     2 Minute Post Oxygen Saturation % 98 %     2 Minute Post Liters of Oxygen 0 L             Oxygen Initial Assessment:  Oxygen Initial Assessment - 11/27/21 1112       Home Oxygen   Home Oxygen Device  None    Sleep Oxygen Prescription None    Home Exercise Oxygen Prescription None    Home Resting Oxygen Prescription None    Compliance with Home Oxygen Use Yes      Initial 6 min Walk   Oxygen Used None      Program Oxygen Prescription   Program Oxygen Prescription None      Intervention   Short Term Goals To learn and understand importance of monitoring SPO2 with pulse oximeter and demonstrate accurate use of the pulse oximeter.;To learn and understand importance of maintaining oxygen saturations>88%;To learn and demonstrate proper pursed lip breathing techniques or other breathing techniques. ;To learn and exhibit compliance with exercise, home and travel O2 prescription    Long  Term Goals Exhibits compliance with exercise, home  and travel O2 prescription;Verbalizes importance of monitoring SPO2 with pulse oximeter and return demonstration;Maintenance of O2 saturations>88%;Exhibits  proper breathing techniques, such as pursed lip breathing or other method taught during program session;Compliance with respiratory medication             Oxygen Re-Evaluation:  Oxygen Re-Evaluation     Row Name 12/01/21 0916 12/23/21 1019 01/20/22 0958         Program Oxygen Prescription   Program Oxygen Prescription None None None       Home Oxygen   Home Oxygen Device None None None     Sleep Oxygen Prescription None None None     Home Exercise Oxygen Prescription None None None     Home Resting Oxygen Prescription None None None     Compliance with Home Oxygen Use Yes Yes Yes       Goals/Expected Outcomes   Short Term Goals To learn and understand importance of monitoring SPO2 with pulse oximeter and demonstrate accurate use of the pulse oximeter.;To learn and understand importance of maintaining oxygen saturations>88%;To learn and demonstrate proper pursed lip breathing techniques or other breathing techniques. ;To learn and exhibit compliance with exercise, home and travel O2 prescription To learn and understand importance of monitoring SPO2 with pulse oximeter and demonstrate accurate use of the pulse oximeter.;To learn and understand importance of maintaining oxygen saturations>88%;To learn and demonstrate proper pursed lip breathing techniques or other breathing techniques.  To learn and understand importance of monitoring SPO2 with pulse oximeter and demonstrate accurate use of the pulse oximeter.;To learn and understand importance of maintaining oxygen saturations>88%;To learn and demonstrate proper pursed lip breathing techniques or other breathing techniques.      Long  Term Goals Exhibits compliance with exercise, home  and travel O2 prescription;Verbalizes importance of monitoring SPO2 with pulse oximeter and return demonstration;Maintenance of O2 saturations>88%;Exhibits proper breathing techniques, such as pursed lip breathing or other method taught during program  session;Compliance with respiratory medication Verbalizes importance of monitoring SPO2 with pulse oximeter and return demonstration;Maintenance of O2 saturations>88%;Exhibits proper breathing techniques, such as pursed lip breathing or other method taught during program session;Compliance with respiratory medication Verbalizes importance of monitoring SPO2 with pulse oximeter and return demonstration;Maintenance of O2 saturations>88%;Exhibits proper breathing techniques, such as pursed lip breathing or other method taught during program session;Compliance with respiratory medication     Comments Reviewed PLB technique with pt.  Talked about how it works and it's importance in maintaining their exercise saturations. Kelsey Carter is doing well in rehab.She uses her pulse oximeter to keep an eye on her oxygen levels at home, and they have been good.  She is using her PLB and finds it helpful with controling her  breathing. Kelsey Carter is doing well in rehab, she reports that her breathing has gotten much better since starting rehab.She uses her pulse oximeter to keep an eye on her oxygen levels at home, and they continue to be good. She is using her PLB and finds it helpful with controling her breathing.     Goals/Expected Outcomes Short: Become more profiecient at using PLB.   Long: Become independent at using PLB. Short: Continue to use PLB daily Long: Continue to monitor saturations. Short: Continue to use PLB daily Long: Continue to monitor saturations.              Oxygen Discharge (Final Oxygen Re-Evaluation):  Oxygen Re-Evaluation - 01/20/22 0958       Program Oxygen Prescription   Program Oxygen Prescription None      Home Oxygen   Home Oxygen Device None    Sleep Oxygen Prescription None    Home Exercise Oxygen Prescription None    Home Resting Oxygen Prescription None    Compliance with Home Oxygen Use Yes      Goals/Expected Outcomes   Short Term Goals To learn and understand importance of  monitoring SPO2 with pulse oximeter and demonstrate accurate use of the pulse oximeter.;To learn and understand importance of maintaining oxygen saturations>88%;To learn and demonstrate proper pursed lip breathing techniques or other breathing techniques.     Long  Term Goals Verbalizes importance of monitoring SPO2 with pulse oximeter and return demonstration;Maintenance of O2 saturations>88%;Exhibits proper breathing techniques, such as pursed lip breathing or other method taught during program session;Compliance with respiratory medication    Comments Kelsey Carter is doing well in rehab, she reports that her breathing has gotten much better since starting rehab.She uses her pulse oximeter to keep an eye on her oxygen levels at home, and they continue to be good. She is using her PLB and finds it helpful with controling her breathing.    Goals/Expected Outcomes Short: Continue to use PLB daily Long: Continue to monitor saturations.             Initial Exercise Prescription:  Initial Exercise Prescription - 11/27/21 1100       Date of Initial Exercise RX and Referring Provider   Date 11/27/21    Referring Provider Serafina Royals MD      Oxygen   Maintain Oxygen Saturation 88% or higher      Recumbant Bike   Level 1    RPM 60    Watts 15    Minutes 15    METs 2.1      NuStep   Level 2    SPM 80    Minutes 15    METs 2.1      REL-XR   Level 1    Speed 50    Minutes 15    METs 2.1      Track   Laps 21    Minutes 15    METs 2.14      Prescription Details   Frequency (times per week) 2    Duration Progress to 30 minutes of continuous aerobic without signs/symptoms of physical distress      Intensity   THRR 40-80% of Max Heartrate 96-128    Ratings of Perceived Exertion 11-13    Perceived Dyspnea 0-4      Progression   Progression Continue to progress workloads to maintain intensity without signs/symptoms of physical distress.      Resistance Training   Training  Prescription Yes  Weight 3 lb    Reps 10-15             Perform Capillary Blood Glucose checks as needed.  Exercise Prescription Changes:   Exercise Prescription Changes     Row Name 11/27/21 1100 12/02/21 1400 12/17/21 1200 12/30/21 0800 01/12/22 1200     Response to Exercise   Blood Pressure (Admit) 124/68 126/70 122/68 118/64 126/64   Blood Pressure (Exercise) 132/70 138/60 126/64 118/62 --   Blood Pressure (Exit) 130/68 124/65 104/66 94/58 102/62   Heart Rate (Admit) 65 bpm 68 bpm 69 bpm 73 bpm 69 bpm   Heart Rate (Exercise) 86 bpm 92 bpm 85 bpm 89 bpm 80 bpm   Heart Rate (Exit) 66 bpm 79 bpm 68 bpm 79 bpm 71 bpm   Oxygen Saturation (Admit) 97 % 95 % 97 % 96 % 98 %   Oxygen Saturation (Exercise) 95 % 95 % 95 % 94 % 97 %   Oxygen Saturation (Exit) 98 % 95 % 98 % 98 % 98 %   Rating of Perceived Exertion (Exercise) 11 11 11 11 12    Perceived Dyspnea (Exercise) 0 0 0 0 0   Symptoms none none none none none   Comments walk test results first day -- -- --   Duration -- Progress to 30 minutes of  aerobic without signs/symptoms of physical distress Continue with 30 min of aerobic exercise without signs/symptoms of physical distress. Continue with 30 min of aerobic exercise without signs/symptoms of physical distress. Continue with 30 min of aerobic exercise without signs/symptoms of physical distress.   Intensity -- THRR unchanged THRR unchanged THRR unchanged THRR unchanged     Progression   Progression -- Continue to progress workloads to maintain intensity without signs/symptoms of physical distress. Continue to progress workloads to maintain intensity without signs/symptoms of physical distress. Continue to progress workloads to maintain intensity without signs/symptoms of physical distress. Continue to progress workloads to maintain intensity without signs/symptoms of physical distress.   Average METs -- 2.7 1.96 2.15 2.17     Resistance Training   Training Prescription --  Yes Yes Yes Yes   Weight -- 3 lb 3 lb 3 lb 3 lb   Reps -- 10-15 10-15 10-15 10-15     Interval Training   Interval Training -- -- No No No     Recumbant Bike   Level -- 1 3 -- 3   Watts -- -- -- -- 20   Minutes -- 15 15 -- 15   METs -- 3.18 -- -- 3.16     NuStep   Level -- 2 3 4 3    Minutes -- 15 15 15 15    METs -- 2.2 2 1.5 2.36     T5 Nustep   Level -- -- 2 -- 4   Minutes -- -- 15 -- 15   METs -- -- 1.8 -- 2.1     Track   Laps -- -- 20 21 25    Minutes -- -- 15 15 15    METs -- -- 2.09 2.14 2.36     Oxygen   Maintain Oxygen Saturation -- -- 88% or higher 88% or higher 88% or higher    Row Name 01/27/22 0700 02/10/22 1500           Response to Exercise   Blood Pressure (Admit) 124/60 114/66      Blood Pressure (Exit) 104/62 114/66      Heart Rate (Admit) 67 bpm 64 bpm  Heart Rate (Exercise) 79 bpm 76 bpm      Heart Rate (Exit) 71 bpm 67 bpm      Oxygen Saturation (Admit) 98 % 95 %      Oxygen Saturation (Exercise) 96 % 97 %      Oxygen Saturation (Exit) 97 % 98 %      Rating of Perceived Exertion (Exercise) 11 11      Perceived Dyspnea (Exercise) 0 0      Symptoms none none      Duration Continue with 30 min of aerobic exercise without signs/symptoms of physical distress. Continue with 30 min of aerobic exercise without signs/symptoms of physical distress.      Intensity THRR unchanged THRR unchanged        Progression   Progression Continue to progress workloads to maintain intensity without signs/symptoms of physical distress. Continue to progress workloads to maintain intensity without signs/symptoms of physical distress.      Average METs 2.55 2.55        Resistance Training   Training Prescription Yes Yes      Weight 3 lb 3 lb      Reps 10-15 10-15        Interval Training   Interval Training No No        Recumbant Bike   Level -- 1      Watts -- 12      Minutes -- 15      METs -- 2.54        REL-XR   Level 3 2      Minutes 15 30       METs 2.5 3.4        T5 Nustep   Level -- 2      Minutes -- 15      METs -- 1.8        Track   Laps 30 --      Minutes 15 --      METs 2.63 --        Home Exercise Plan   Plans to continue exercise at -- Home (comment)  walking      Frequency -- Add 2 additional days to program exercise sessions.      Initial Home Exercises Provided -- 12/23/21        Oxygen   Maintain Oxygen Saturation 88% or higher 88% or higher               Exercise Comments:   Exercise Comments     Row Name 12/01/21 0916           Exercise Comments First full day of exercise!  Patient was oriented to gym and equipment including functions, settings, policies, and procedures.  Patient's individual exercise prescription and treatment plan were reviewed.  All starting workloads were established based on the results of the 6 minute walk test done at initial orientation visit.  The plan for exercise progression was also introduced and progression will be customized based on patient's performance and goals.                Exercise Goals and Review:   Exercise Goals     Row Name 11/27/21 1138             Exercise Goals   Increase Physical Activity Yes       Intervention Provide advice, education, support and counseling about physical activity/exercise needs.;Develop an individualized exercise prescription for aerobic and resistive training based on  initial evaluation findings, risk stratification, comorbidities and participant's personal goals.       Expected Outcomes Short Term: Attend rehab on a regular basis to increase amount of physical activity.;Long Term: Add in home exercise to make exercise part of routine and to increase amount of physical activity.;Long Term: Exercising regularly at least 3-5 days a week.       Increase Strength and Stamina Yes       Intervention Provide advice, education, support and counseling about physical activity/exercise needs.;Develop an individualized exercise  prescription for aerobic and resistive training based on initial evaluation findings, risk stratification, comorbidities and participant's personal goals.       Expected Outcomes Short Term: Increase workloads from initial exercise prescription for resistance, speed, and METs.;Short Term: Perform resistance training exercises routinely during rehab and add in resistance training at home;Long Term: Improve cardiorespiratory fitness, muscular endurance and strength as measured by increased METs and functional capacity (6MWT)       Able to understand and use rate of perceived exertion (RPE) scale Yes       Intervention Provide education and explanation on how to use RPE scale       Expected Outcomes Short Term: Able to use RPE daily in rehab to express subjective intensity level;Long Term:  Able to use RPE to guide intensity level when exercising independently       Able to understand and use Dyspnea scale Yes       Intervention Provide education and explanation on how to use Dyspnea scale       Expected Outcomes Short Term: Able to use Dyspnea scale daily in rehab to express subjective sense of shortness of breath during exertion;Long Term: Able to use Dyspnea scale to guide intensity level when exercising independently       Knowledge and understanding of Target Heart Rate Range (THRR) Yes       Intervention Provide education and explanation of THRR including how the numbers were predicted and where they are located for reference       Expected Outcomes Short Term: Able to state/look up THRR;Short Term: Able to use daily as guideline for intensity in rehab;Long Term: Able to use THRR to govern intensity when exercising independently       Able to check pulse independently Yes       Intervention Provide education and demonstration on how to check pulse in carotid and radial arteries.;Review the importance of being able to check your own pulse for safety during independent exercise       Expected Outcomes  Short Term: Able to explain why pulse checking is important during independent exercise;Long Term: Able to check pulse independently and accurately       Understanding of Exercise Prescription Yes       Intervention Provide education, explanation, and written materials on patient's individual exercise prescription       Expected Outcomes Short Term: Able to explain program exercise prescription;Long Term: Able to explain home exercise prescription to exercise independently                Exercise Goals Re-Evaluation :  Exercise Goals Re-Evaluation     Row Name 12/01/21 0916 12/17/21 1241 12/23/21 1012 12/30/21 0823 12/30/21 0828     Exercise Goal Re-Evaluation   Exercise Goals Review Increase Physical Activity;Able to understand and use rate of perceived exertion (RPE) scale;Knowledge and understanding of Target Heart Rate Range (THRR);Understanding of Exercise Prescription;Increase Strength and Stamina;Able to understand and use Dyspnea scale;Able  to check pulse independently Increase Physical Activity;Increase Strength and Stamina;Understanding of Exercise Prescription Increase Physical Activity;Increase Strength and Stamina;Understanding of Exercise Prescription;Able to understand and use rate of perceived exertion (RPE) scale;Able to understand and use Dyspnea scale;Knowledge and understanding of Target Heart Rate Range (THRR);Able to check pulse independently Increase Physical Activity;Increase Strength and Stamina --   Comments Reviewed RPE and dyspnea scales, THR and program prescription with pt today.  Pt voiced understanding and was given a copy of goals to take home. Jaziya is off to a good start in rehab.  She has completed her first five sessions thus far.  We will continue to monitor her progress. Reviewed home exercise with pt today.  Pt plans to walk at home and continue to go to [facility] twice a week too,  for exercise.  Reviewed THR, pulse, RPE, sign and symptoms, pulse oximetery and  when to call 911 or MD.  Also discussed weather considerations and indoor options.  Pt voiced understanding. Saryiah is up to level 4 on T4 and has reached 21 laps on the track.  Staff reviewed THR range and importance of trying to reach that range. --   Expected Outcomes Short: Use RPE daily to regulate intensity. Long: Follow program prescription in THR. Short: Continue to attend rehab regularly Long: Continue to follow program prescription SHort: Continue to go to [facility] on off days Long: Continue to improve stamina. -- Short: reach THR range Long:  continue to build stamina    Row Name 01/12/22 1246 01/20/22 0959 01/27/22 0744 02/10/22 1536       Exercise Goal Re-Evaluation   Exercise Goals Review Increase Physical Activity;Increase Strength and Stamina Increase Physical Activity;Increase Strength and Stamina Increase Physical Activity;Increase Strength and Stamina Increase Physical Activity;Increase Strength and Stamina;Understanding of Exercise Prescription    Comments Kelsey Carter is doing well in rehab. She increased to level 4 on the T5 and was able to tolerate 25 laps on the track! She would benefit from increasing her weights to 4lbs. Will continue to monitor. [facility] - she is doing recumbant bike (she reports working as hard as she does here at rehab- she will exercise for 35-35minutes), she is looking to do some classes as well. [facility] Kelsey Carter is progressing well and has increased to 30 laps on the track!  She has also improved average MET level.  Staff will encourage trying 4 lb for strength work. Kelsey Carter is doing well in rehab.  She is doing 30 min on the XR some days when she does not walk.  She is also up to 19 watts on the bike.  We will continue to monitor her progress.    Expected Outcomes Short: Increase handweights to 4 lbs Long: Continue to increase overall MET level Short: Increase handweights to 4 lbs Long: Continue to increase  overall MET level Short: try 4 lb Long: continue to build stamina Short: Move XR back up to level 4 Long; Conitnue to improve stamina             Discharge Exercise Prescription (Final Exercise Prescription Changes):  Exercise Prescription Changes - 02/10/22 1500       Response to Exercise   Blood Pressure (Admit) 114/66    Blood Pressure (Exit) 114/66    Heart Rate (Admit) 64 bpm    Heart Rate (Exercise) 76 bpm    Heart Rate (Exit) 67 bpm    Oxygen  Saturation (Admit) 95 %    Oxygen Saturation (Exercise) 97 %    Oxygen Saturation (Exit) 98 %    Rating of Perceived Exertion (Exercise) 11    Perceived Dyspnea (Exercise) 0    Symptoms none    Duration Continue with 30 min of aerobic exercise without signs/symptoms of physical distress.    Intensity THRR unchanged      Progression   Progression Continue to progress workloads to maintain intensity without signs/symptoms of physical distress.    Average METs 2.55      Resistance Training   Training Prescription Yes    Weight 3 lb    Reps 10-15      Interval Training   Interval Training No      Recumbant Bike   Level 1    Watts 12    Minutes 15    METs 2.54      REL-XR   Level 2    Minutes 30    METs 3.4      T5 Nustep   Level 2    Minutes 15    METs 1.8      Home Exercise Plan   Plans to continue exercise at Home (comment)   walking   Frequency Add 2 additional days to program exercise sessions.    Initial Home Exercises Provided 12/23/21      Oxygen   Maintain Oxygen Saturation 88% or higher             Nutrition:  Target Goals: Understanding of nutrition guidelines, daily intake of sodium '1500mg'$ , cholesterol '200mg'$ , calories 30% from fat and 7% or less from saturated fats, daily to have 5 or more servings of fruits and vegetables.  Education: All About Nutrition: -Group instruction provided by verbal, written material, interactive activities, discussions, models, and posters to present general  guidelines for heart healthy nutrition including fat, fiber, MyPlate, the role of sodium in heart healthy nutrition, utilization of the nutrition label, and utilization of this knowledge for meal planning. Follow up email sent as well. Written material given at graduation. Flowsheet Row Pulmonary Rehab from 12/04/2021 in [facility] Cardiac and Pulmonary Rehab  Education need identified 11/27/21       Biometrics:  Pre Biometrics - 11/27/21 1139       Pre Biometrics   Height 5' 3.2" (1.605 m)    Weight 145 lb (65.8 kg)    BMI (Calculated) 25.53    Single Leg Stand 1.83 seconds              Nutrition Therapy Plan and Nutrition Goals:  Nutrition Therapy & Goals - 12/30/21 0918       Nutrition Therapy   Diet Heart healthy, low Na    Drug/Food Interactions Statins/Certain Fruits    Protein (specify units) 55-60g    Fiber 25 grams    Whole Grain Foods 3 servings    Saturated Fats 12 max. grams    Fruits and Vegetables 8 servings/day    Sodium 2 grams      Personal Nutrition Goals   Nutrition Goal ST: add 1 good source of protein to breakfast or mid morning snack - high protein yogurt, cottage cheese, nuts/seeds, peanut butter, boiled egg LT: meet calorie and protein needs, maintain weight, maintian current heart healthy changes    Comments 76 y.o. F admitted to rehab for heart failure aslo presenting with CAD, HTN, HLD, afib, GERD. PMH includes breast cancer with chemo. Relevant medications include lipitor, vit D, lasix, magnesium,  K+.  PYP 78. She tries to eat 2x/day instead of 3 and feels her diet is going well. Breakfast or lunch: Coffee with some cream. English muffin (jelly or apple butter) or cereal (raisin bran or cheerios 2% milk) or oatmeal, if going out to eat will get an egg  Dinner: chicken with vegetables and sometimes mashed potatoes, salads. She will get steak with a salad and sweet potato when going out to eat. Goes out to eat 1x/week. She does not salt her food. She uses  olive oil. Snacks: clementines Drinks: water. Kelsey Carter reports eating less due to her lower appetite. She has lost 60 pounds over 4 years. She reports her weight has been steady. Reviewed heart healthy nutrition. Discussed the importance of variety to meet nutritional needs and importance of calories and protein needs being met to help retain lean muscle tissue.      Intervention Plan   Intervention Prescribe, educate and counsel regarding individualized specific dietary modifications aiming towards targeted core components such as weight, hypertension, lipid management, diabetes, heart failure and other comorbidities.    Expected Outcomes Short Term Goal: Understand basic principles of dietary content, such as calories, fat, sodium, cholesterol and nutrients.;Short Term Goal: A plan has been developed with personal nutrition goals set during dietitian appointment.;Long Term Goal: Adherence to prescribed nutrition plan.             Nutrition Assessments:  MEDIFICTS Score Key: ?70 Need to make dietary changes  40-70 Heart Healthy Diet ? 40 Therapeutic Level Cholesterol Diet  Flowsheet Row Pulmonary Rehab from 11/27/2021 in [facility] Cardiac and Pulmonary Rehab  Picture Your Plate Total Score on Admission 78      Picture Your Plate Scores: <81 Unhealthy dietary pattern with much room for improvement. 41-50 Dietary pattern unlikely to meet recommendations for good health and room for improvement. 51-60 More healthful dietary pattern, with some room for improvement.  >60 Healthy dietary pattern, although there may be some specific behaviors that could be improved.   Nutrition Goals Re-Evaluation:  Nutrition Goals Re-Evaluation     [place] Name 12/23/21 1017 01/20/22 1005           Goals   Nutrition Goal -- ST: continue to add 1 good source of protein to breakfast or mid morning snack - high protein yogurt, nuts/seeds, peanut butter, boiled egg LT: meet calorie and protein needs, maintain  weight, maintian current heart healthy changes      Comment Will meet with dietitan next week. She is eating more fruit than before and reports losing 2 pounds this past week. She reports adding in a hard boiled egg to her morning to increase her protein - she likes yogurt, but not cottage cheese. She reports not other changes and continues to stick to the changes she made previously. She continues to eat mostly chicken and sometimes tuna fish as she does not enjoy most fish. tonight she is making ravioli from scratch with spinach - she loves cooking.      Expected Outcome -- ST: continue to add 1 good source of protein to breakfast or mid morning snack - high protein yogurt, nuts/seeds, peanut butter, boiled egg LT: meet calorie and protein needs, maintain weight, maintian current heart healthy changes               Nutrition Goals Discharge (Final Nutrition Goals Re-Evaluation):  Nutrition Goals Re-Evaluation - 01/20/22 1005       Goals   Nutrition Goal ST: continue to add 1  good source of protein to breakfast or mid morning snack - high protein yogurt, nuts/seeds, peanut butter, boiled egg LT: meet calorie and protein needs, maintain weight, maintian current heart healthy changes    Comment She is eating more fruit than before and reports losing 2 pounds this past week. She reports adding in a hard boiled egg to her morning to increase her protein - she likes yogurt, but not cottage cheese. She reports not other changes and continues to stick to the changes she made previously. She continues to eat mostly chicken and sometimes tuna fish as she does not enjoy most fish. tonight she is making ravioli from scratch with spinach - she loves cooking.    Expected Outcome ST: continue to add 1 good source of protein to breakfast or mid morning snack - high protein yogurt, nuts/seeds, peanut butter, boiled egg LT: meet calorie and protein needs, maintain weight, maintian current heart healthy changes              Psychosocial: Target Goals: Acknowledge presence or absence of significant depression and/or stress, maximize coping skills, provide positive support system. Participant is able to verbalize types and ability to use techniques and skills needed for reducing stress and depression.   Education: Stress, Anxiety, and Depression - Group verbal and visual presentation to define topics covered.  Reviews how body is impacted by stress, anxiety, and depression.  Also discusses healthy ways to reduce stress and to treat/manage anxiety and depression.  Written material given at graduation.   Education: Sleep Hygiene -Provides group verbal and written instruction about how sleep can affect your health.  Define sleep hygiene, discuss sleep cycles and impact of sleep habits. Review good sleep hygiene tips.    Initial Review & Psychosocial Screening:  Initial Psych Review & Screening - 11/17/21 1553       Initial Review   Current issues with None Identified      Family Dynamics   Good Support System? Yes    Comments She can look to her husband, son and daughter for support. She has a positive outlook on her health. She has done the program once before and is excited to do it again.      Barriers   Psychosocial barriers to participate in program There are no identifiable barriers or psychosocial needs.;The patient should benefit from training in stress management and relaxation.      Screening Interventions   Interventions Encouraged to exercise;To provide support and resources with identified psychosocial needs;Provide feedback about the scores to participant    Expected Outcomes Short Term goal: Utilizing psychosocial counselor, staff and physician to assist with identification of specific Stressors or current issues interfering with healing process. Setting desired goal for each stressor or current issue identified.;Long Term Goal: Stressors or current issues are controlled or  eliminated.;Short Term goal: Identification and review with participant of any Quality of Life or Depression concerns found by scoring the questionnaire.;Long Term goal: The participant improves quality of Life and PHQ9 Scores as seen by post scores and/or verbalization of changes             Quality of Life Scores:  Scores of 19 and below usually indicate a poorer quality of life in these areas.  A difference of  2-3 points is a clinically meaningful difference.  A difference of 2-3 points in the total score of the Quality of Life Index has been associated with significant improvement in overall quality of life, self-image, physical symptoms, and  general health in studies assessing change in quality of life.  PHQ-9: Recent Review Flowsheet Data     Depression screen [facility] 2/9 12/02/2021 11/27/2021 01/29/2020 08/24/2019 09/21/2018   Decreased Interest 0 0 0 0 0   Down, Depressed, Hopeless 0 0 0 0 0   PHQ - 2 Score 0 0 0 0 0   Altered sleeping - 0 0 0 0   Tired, decreased energy - 1 0 1 0   Change in appetite - 0 0 0 0   Feeling bad or failure about yourself  - 0 0 0 0   Trouble concentrating - 0 0 0 0   Moving slowly or fidgety/restless - 0 0 0 0   Suicidal thoughts - 0 0 0 0   PHQ-9 Score - 1 0 1 0   Difficult doing work/chores - Not difficult at all - Not difficult at all -      Interpretation of Total Score  Total Score Depression Severity:  1-4 = Minimal depression, 5-9 = Mild depression, 10-14 = Moderate depression, 15-19 = Moderately severe depression, 20-27 = Severe depression   Psychosocial Evaluation and Intervention:  Psychosocial Evaluation - 11/17/21 1554       Psychosocial Evaluation & Interventions   Interventions Encouraged to exercise with the program and follow exercise prescription;Stress management education;Relaxation education    Comments She can look to her husband, son and daughter for support. She has a positive outlook on her health. She has done the program  once before and is excited to do it again.    Expected Outcomes Short: Start [facility] to help with mood. Long: Maintain a healthy mental state.    Continue Psychosocial Services  Follow up required by staff             Psychosocial Re-Evaluation:  Psychosocial Re-Evaluation     [place] Name 12/23/21 1015 01/20/22 1001           Psychosocial Re-Evaluation   Current issues with None Identified None Identified      Comments Kelsey Carter is doing well in rehab.  She is feeling good mentally. She has not had any major stressors and she sleeps well overall.  Now that she is on the diueretic she is no longer SOB at night.  She does get frustrated with her balance from her nueropathy, but she just copes by holding on to things more. Kelsey Carter [place] Kelsey Carter reports no stress at this time; she reports using her husband and family as support. She reports sleeping well since being on lasix.      Expected Outcomes Short: Conitnue to sleep well Long:Continue to focus on positive. Short: Conitnue to sleep well Long:Continue to focus on positive.      Interventions Encouraged to attend Cardiac Rehabilitation for the exercise Encouraged to attend Cardiac Rehabilitation for the exercise      Continue Psychosocial Services  Follow up required by staff Follow up required by staff               Psychosocial Discharge (Final Psychosocial Re-Evaluation):  Psychosocial Re-Evaluation - 01/20/22 1001       Psychosocial Re-Evaluation   Current issues with None Identified    Comments Kelsey Carter [place]. Kelsey Carter reports no stress at this time; she reports using her husband and family as support. She reports sleeping well  since being on lasix.    Expected Outcomes Short: Conitnue to sleep well Long:Continue to focus on positive.    Interventions Encouraged to attend Cardiac Rehabilitation for the  exercise    Continue Psychosocial Services  Follow up required by staff             Education: Education Goals: Education classes will be provided on a weekly basis, covering required topics. Participant will state understanding/return demonstration of topics presented.  Learning Barriers/Preferences:  Learning Barriers/Preferences - 11/17/21 1552       Learning Barriers/Preferences   Learning Barriers None    Learning Preferences None             General Pulmonary Education Topics:  Infection Prevention: - Provides verbal and written material to individual with discussion of infection control including proper hand washing and proper equipment cleaning during exercise session. Flowsheet Row Pulmonary Rehab from 11/17/2021 in [facility] Cardiac and Pulmonary Rehab  Date 11/17/21  Educator [facility]  Instruction Review Code 1- Verbalizes Understanding       Falls Prevention: - Provides verbal and written material to individual with discussion of falls prevention and safety. Flowsheet Row Pulmonary Rehab from 11/17/2021 in [facility] Cardiac and Pulmonary Rehab  Date 11/17/21  Educator [facility]  Instruction Review Code 1- Verbalizes Understanding       Chronic Lung Disease Review: - Group verbal instruction with posters, models, PowerPoint presentations and videos,  to review new updates, new respiratory medications, new advancements in procedures and treatments. Providing information on websites and "800" numbers for continued self-education. Includes information about supplement oxygen, available portable oxygen systems, continuous and intermittent flow rates, oxygen safety, concentrators, and Medicare reimbursement for oxygen. Explanation of Pulmonary Drugs, including class, frequency, complications, importance of spacers, rinsing mouth after steroid MDI's, and proper cleaning methods for nebulizers. Review of basic lung anatomy and physiology related to function, structure, and complications  of lung disease. Review of risk factors. Discussion about methods for diagnosing sleep apnea and types of masks and machines for OSA. Includes a review of the use of types of environmental controls: home humidity, furnaces, filters, dust mite/pet prevention, HEPA vacuums. Discussion about weather changes, air quality and the benefits of nasal washing. Instruction on Warning signs, infection symptoms, calling MD promptly, preventive modes, and value of vaccinations. Review of effective airway clearance, coughing and/or vibration techniques. Emphasizing that all should Create an Action Plan. Written material given at graduation.   AED/CPR: - Group verbal and written instruction with the use of models to demonstrate the basic use of the AED with the basic ABC's of resuscitation.    Anatomy and Cardiac Procedures: - Group verbal and visual presentation and models provide information about basic cardiac anatomy and function. Reviews the testing methods done to diagnose heart disease and the outcomes of the test results. Describes the treatment choices: Medical Management, Angioplasty, or Coronary Bypass Surgery for treating various heart conditions including Myocardial Infarction, Angina, Valve Disease, and Cardiac Arrhythmias.  Written material given at graduation. Flowsheet Row Pulmonary Rehab from 12/04/2021 in [facility] Cardiac and Pulmonary Rehab  Education need identified 11/27/21       Medication Safety: - Group verbal and visual instruction to review commonly prescribed medications for heart and lung disease. Reviews the medication, class of the drug, and side effects. Includes the steps to properly store meds and maintain the prescription regimen.  Written material given at graduation.   Other: -Provides group and verbal instruction on various topics (see comments)   Knowledge  Questionnaire Score:  Knowledge Questionnaire Score - 11/27/21 1112       Knowledge Questionnaire Score   Pre  Score 23/26: Angina, Nutrition, Exercise              Core Components/Risk Factors/Patient Goals at Admission:  Personal Goals and Risk Factors at Admission - 11/27/21 1140       Core Components/Risk Factors/Patient Goals on Admission    Weight Management Yes;Weight Maintenance   Patient already lost weight and wants to maintain   Intervention Weight Management: Develop a combined nutrition and exercise program designed to reach desired caloric intake, while maintaining appropriate intake of nutrient and fiber, sodium and fats, and appropriate energy expenditure required for the weight goal.;Weight Management: Provide education and appropriate resources to help participant work on and attain dietary goals.;Weight Management/Obesity: Establish reasonable short term and long term weight goals.    Admit Weight 145 lb (65.8 kg)    Goal Weight: Short Term 145 lb (65.8 kg)    Goal Weight: Long Term 145 lb (65.8 kg)    Expected Outcomes Short Term: Continue to assess and modify interventions until short term weight is achieved;Long Term: Adherence to nutrition and physical activity/exercise program aimed toward attainment of established weight goal;Understanding recommendations for meals to include 15-35% energy as protein, 25-35% energy from fat, 35-60% energy from carbohydrates, less than $RemoveB'200mg'[id number]$  of dietary cholesterol, 20-35 gm of total fiber daily;Understanding of distribution of calorie intake throughout the day with the consumption of 4-5 meals/snacks;Weight Maintenance: Understanding of the daily nutrition guidelines, which includes 25-35% calories from fat, 7% or less cal from saturated fats, less than $RemoveB'200mg'hyTNPzAx$  cholesterol, less than 1.5gm of sodium, & 5 or more servings of fruits and vegetables daily    Improve shortness of breath with ADL's Yes    Intervention Provide education, individualized exercise plan and daily activity instruction to help decrease symptoms of SOB with activities of daily  living.    Expected Outcomes Short Term: Improve cardiorespiratory fitness to achieve a reduction of symptoms when performing ADLs;Long Term: Be able to perform more ADLs without symptoms or delay the onset of symptoms    Increase knowledge of respiratory medications and ability to use respiratory devices properly  Yes    Intervention Provide education and demonstration as needed of appropriate use of medications, inhalers, and oxygen therapy.    Expected Outcomes Short Term: Achieves understanding of medications use. Understands that oxygen is a medication prescribed by physician. Demonstrates appropriate use of inhaler and oxygen therapy.;Long Term: Maintain appropriate use of medications, inhalers, and oxygen therapy.    Hypertension Yes    Intervention Provide education on lifestyle modifcations including regular physical activity/exercise, weight management, moderate sodium restriction and increased consumption of fresh fruit, vegetables, and low fat dairy, alcohol moderation, and smoking cessation.;Monitor prescription use compliance.    Expected Outcomes Short Term: Continued assessment and intervention until BP is < 140/44mm HG in hypertensive participants. < 130/74mm HG in hypertensive participants with diabetes, heart failure or chronic kidney disease.;Long Term: Maintenance of blood pressure at goal levels.    Lipids Yes    Intervention Provide education and support for participant on nutrition & aerobic/resistive exercise along with prescribed medications to achieve LDL '70mg'$ , HDL >$Remo'40mg'ffJlo$ .    Expected Outcomes Short Term: Participant states understanding of desired cholesterol values and is compliant with medications prescribed. Participant is following exercise prescription and nutrition guidelines.;Long Term: Cholesterol controlled with medications as prescribed, with individualized exercise RX and with personalized nutrition plan. Value goals:  LDL < $Rem'70mg'JoVi$ , HDL > 40 mg.              Education:Diabetes - Individual verbal and written instruction to review signs/symptoms of diabetes, desired ranges of glucose level fasting, after meals and with exercise. Acknowledge that pre and post exercise glucose checks will be done for 3 sessions at entry of program.   Know Your Numbers and Heart Failure: - Group verbal and visual instruction to discuss disease risk factors for cardiac and pulmonary disease and treatment options.  Reviews associated critical values for Overweight/Obesity, Hypertension, Cholesterol, and Diabetes.  Discusses basics of heart failure: signs/symptoms and treatments.  Introduces Heart Failure Zone chart for action plan for heart failure.  Written material given at graduation.   Core Components/Risk Factors/Patient Goals Review:   Goals and Risk Factor Review     Row Name 12/23/21 1017 01/20/22 0959           Core Components/Risk Factors/Patient Goals Review   Personal Goals Review Weight Management/Obesity;Improve shortness of breath with ADL's;Lipids;Hypertension Weight Management/Obesity;Improve shortness of breath with ADL's;Lipids;Hypertension      Review Kelsey Carter is doing well in rehab.  Her weight is staying steady, usually within a pound or two most days.  She takes her dieuretic to help with her breathing and feels better there too.  Her blood pressures are doing well. Kelsey Carter is doing well in rehab, she reports that her breathing has gotten much better since starting rehab.She uses her pulse oximeter to keep an eye on her oxygen levels at home, and they continue to be good. She is using her PLB and finds it helpful with controling her breathing. She is having no issues riht now completing her ADLs and reports feeling good. She continues to check her BP at home regularly (120s/80s or lower), today her BP was 124/64. She reports continuing to do well with her diet and that she lost two pounds this week which she is happy about. She continues to take her  medications as directed and reports no issues.      Expected Outcomes Short: continue to work on breathing Long: continue to montior risk factors. Short: continue to attend rehab regularly Long: continue to montior risk factors.               Core Components/Risk Factors/Patient Goals at Discharge (Final Review):   Goals and Risk Factor Review - 01/20/22 0959       Core Components/Risk Factors/Patient Goals Review   Personal Goals Review Weight Management/Obesity;Improve shortness of breath with ADL's;Lipids;Hypertension    Review Kelsey Carter is doing well in rehab, she reports that her breathing has gotten much better since starting rehab.She uses her pulse oximeter to keep an eye on her oxygen levels at home, and they continue to be good. She is using her PLB and finds it helpful with controling her breathing. She is having no issues riht now completing her ADLs and reports feeling good. She continues to check her BP at home regularly (120s/80s or lower), today her BP was 124/64. She reports continuing to do well with her diet and that she lost two pounds this week which she is happy about. She continues to take her medications as directed and reports no issues.    Expected Outcomes Short: continue to attend rehab regularly Long: continue to montior risk factors.             ITP Comments:  ITP Comments     Row Name 11/17/21 1550 11/27/21 1058 12/01/21  6122 12/17/21 0646 12/30/21 0938   ITP Comments Virtual Visit completed. Patient informed on EP and RD appointment and 6 Minute walk test. Patient also informed of patient health questionnaires on My Chart. Patient Verbalizes understanding. Visit diagnosis can be found in [facility] 10/23/2021. Completed 6MWT and gym orientation. Initial ITP created and sent for review to Dr. Ottie Glazier , Medical Director. First full day of exercise!  Patient was oriented to gym and equipment including functions, settings, policies, and procedures.  Patient's  individual exercise prescription and treatment plan were reviewed.  All starting workloads were established based on the results of the 6 minute walk test done at initial orientation visit.  The plan for exercise progression was also introduced and progression will be customized based on patient's performance and goals. 30 Day review completed. Medical Director ITP review done, changes made as directed, and signed approval by Medical Director. Completed initial RD consultation    [place] Name 01/14/22 0822 02/11/22 0745         ITP Comments 30 Day review completed. Medical Director ITP review done, changes made as directed, and signed approval by Medical Director. 30 Day review completed. Medical Director ITP review done, changes made as directed, and signed approval by Medical Director.               Comments:

## 2022-02-12 ENCOUNTER — Other Ambulatory Visit: Payer: Self-pay

## 2022-02-12 DIAGNOSIS — I5032 Chronic diastolic (congestive) heart failure: Secondary | ICD-10-CM

## 2022-02-12 DIAGNOSIS — Z5189 Encounter for other specified aftercare: Secondary | ICD-10-CM | POA: Diagnosis not present

## 2022-02-12 NOTE — Progress Notes (Signed)
Daily Session Note  Patient Details  Name: Kelsey Carter MRN: [id number] Date of Birth: October 16, 1946 Referring Provider:   Flowsheet Row Pulmonary Rehab from 11/27/2021 in [facility] Cardiac and Pulmonary Rehab  Referring Provider Serafina Royals MD       Encounter Date: 02/12/2022  Check In:  Session Check In - 02/12/22 0958       Check-In   Supervising physician immediately available to respond to emergencies See telemetry face sheet for immediately available ER MD    Location [facility]-Cardiac & Pulmonary Rehab    Staff Present Birdie Sons, MPA, RN;Melissa Jolivue, RDN, Wilhelmina Mcardle, BS, ACSM CEP, Exercise Physiologist    Virtual Visit No    Medication changes reported     No    Fall or balance concerns reported    No    Warm-up and Cool-down Performed on first and last piece of equipment    Resistance Training Performed Yes    VAD Patient? No    PAD/SET Patient? No      Pain Assessment   Currently in Pain? No/denies                Social History   Tobacco Use  Smoking Status Former   Packs/day: 0.25   Years: 3.00   Pack years: 0.75   Types: Cigarettes   Quit date: 10/21/1965   Years since quitting: 56.3  Smokeless Tobacco Never    Goals Met:  Independence with exercise equipment Exercise tolerated well No report of concerns or symptoms today Strength training completed today  Goals Unmet:  Not Applicable  Comments: Pt able to follow exercise prescription today without complaint.  Will continue to monitor for progression.    Dr. Emily Filbert is Medical Director for [place].  Dr. Ottie Glazier is Medical Director for [facility] Pulmonary Rehabilitation.

## 2022-02-17 DIAGNOSIS — Z79899 Other long term (current) drug therapy: Secondary | ICD-10-CM | POA: Diagnosis not present

## 2022-02-17 DIAGNOSIS — I480 Paroxysmal atrial fibrillation: Secondary | ICD-10-CM | POA: Diagnosis not present

## 2022-02-17 DIAGNOSIS — I10 Essential (primary) hypertension: Secondary | ICD-10-CM | POA: Diagnosis not present

## 2022-02-17 DIAGNOSIS — D649 Anemia, unspecified: Secondary | ICD-10-CM | POA: Diagnosis not present

## 2022-02-17 DIAGNOSIS — R8290 Unspecified abnormal findings in urine: Secondary | ICD-10-CM | POA: Diagnosis not present

## 2022-02-17 DIAGNOSIS — I509 Heart failure, unspecified: Secondary | ICD-10-CM | POA: Diagnosis not present

## 2022-02-17 DIAGNOSIS — R7309 Other abnormal glucose: Secondary | ICD-10-CM | POA: Diagnosis not present

## 2022-02-17 DIAGNOSIS — C50912 Malignant neoplasm of unspecified site of left female breast: Secondary | ICD-10-CM | POA: Diagnosis not present

## 2022-02-17 DIAGNOSIS — Z9013 Acquired absence of bilateral breasts and nipples: Secondary | ICD-10-CM | POA: Diagnosis not present

## 2022-02-17 DIAGNOSIS — C50911 Malignant neoplasm of unspecified site of right female breast: Secondary | ICD-10-CM | POA: Diagnosis not present

## 2022-02-17 DIAGNOSIS — Z9889 Other specified postprocedural states: Secondary | ICD-10-CM | POA: Diagnosis not present

## 2022-02-18 DIAGNOSIS — C50912 Malignant neoplasm of unspecified site of left female breast: Secondary | ICD-10-CM | POA: Diagnosis not present

## 2022-02-18 DIAGNOSIS — Z0000 Encounter for general adult medical examination without abnormal findings: Secondary | ICD-10-CM | POA: Diagnosis not present

## 2022-02-18 DIAGNOSIS — C50911 Malignant neoplasm of unspecified site of right female breast: Secondary | ICD-10-CM | POA: Diagnosis not present

## 2022-02-18 DIAGNOSIS — G620 Drug-induced polyneuropathy: Secondary | ICD-10-CM | POA: Diagnosis not present

## 2022-02-18 DIAGNOSIS — I2510 Atherosclerotic heart disease of native coronary artery without angina pectoris: Secondary | ICD-10-CM | POA: Diagnosis not present

## 2022-02-18 DIAGNOSIS — I10 Essential (primary) hypertension: Secondary | ICD-10-CM | POA: Diagnosis not present

## 2022-02-18 DIAGNOSIS — Z9013 Acquired absence of bilateral breasts and nipples: Secondary | ICD-10-CM | POA: Diagnosis not present

## 2022-02-18 DIAGNOSIS — I480 Paroxysmal atrial fibrillation: Secondary | ICD-10-CM | POA: Diagnosis not present

## 2022-02-18 DIAGNOSIS — N1832 Chronic kidney disease, stage 3b: Secondary | ICD-10-CM | POA: Diagnosis not present

## 2022-02-18 DIAGNOSIS — D649 Anemia, unspecified: Secondary | ICD-10-CM | POA: Diagnosis not present

## 2022-02-18 DIAGNOSIS — T451X5A Adverse effect of antineoplastic and immunosuppressive drugs, initial encounter: Secondary | ICD-10-CM | POA: Diagnosis not present

## 2022-02-18 DIAGNOSIS — Z9889 Other specified postprocedural states: Secondary | ICD-10-CM | POA: Diagnosis not present

## 2022-02-19 ENCOUNTER — Other Ambulatory Visit: Payer: Self-pay

## 2022-02-19 ENCOUNTER — Encounter: Payer: PPO | Attending: Internal Medicine

## 2022-02-19 DIAGNOSIS — I5032 Chronic diastolic (congestive) heart failure: Secondary | ICD-10-CM | POA: Diagnosis not present

## 2022-02-19 NOTE — Progress Notes (Signed)
Daily Session Note ? ?Patient Details  ?Name: Kelsey Carter ?MRN: [id number] ?Date of Birth: 05/19/1946 ?Referring Provider:   ?Flowsheet Row Pulmonary Rehab from 11/27/2021 in [facility] Cardiac and Pulmonary Rehab  ?Referring Provider Serafina Royals MD  ? ?  ? ? ?Encounter Date: 02/19/2022 ? ?Check In: ? Session Check In - 02/19/22 0951   ? ?  ? Check-In  ? Supervising physician immediately available to respond to emergencies See telemetry face sheet for immediately available ER MD   ? Location [facility]-Cardiac & Pulmonary Rehab   ? Staff Present Birdie Sons, MPA, RN;Fishel Wamble Amedeo Plenty, BS, ACSM CEP, Exercise Physiologist;Jessica Fouke, MA, RCEP, CCRP, CCET;Melissa Elsie, RDN, LDN   ? Virtual Visit No   ? Medication changes reported     No   ? Fall or balance concerns reported    No   ? Warm-up and Cool-down Performed on first and last piece of equipment   ? Resistance Training Performed Yes   ? VAD Patient? No   ? PAD/SET Patient? No   ?  ? Pain Assessment  ? Currently in Pain? No/denies   ? ?  ?  ? ?  ? ? ? ? ? ?Social History  ? ?Tobacco Use  ?Smoking Status Former  ? Packs/day: 0.25  ? Years: 3.00  ? Pack years: 0.75  ? Types: Cigarettes  ? Quit date: 10/21/1965  ? Years since quitting: 56.3  ?Smokeless Tobacco Never  ? ? ?Goals Met:  ?Independence with exercise equipment ?Exercise tolerated well ?No report of concerns or symptoms today ?Strength training completed today ? ?Goals Unmet:  ?Not Applicable ? ?Comments: Pt able to follow exercise prescription today without complaint.  Will continue to monitor for progression. ? ? ? ?Dr. Emily Filbert is Medical Director for [place].  ?Dr. Ottie Glazier is Medical Director for [facility] Pulmonary Rehabilitation. ?

## 2022-02-24 ENCOUNTER — Other Ambulatory Visit: Payer: Self-pay

## 2022-02-24 DIAGNOSIS — I5032 Chronic diastolic (congestive) heart failure: Secondary | ICD-10-CM

## 2022-02-24 NOTE — Progress Notes (Signed)
Daily Session Note ? ?Patient Details  ?Name: Kelsey Carter ?MRN: [id number] ?Date of Birth: [birth year] ?Referring Provider:   ?Flowsheet Row Pulmonary Rehab from 11/27/2021 in [facility] Cardiac and Pulmonary Rehab  ?Referring Provider Serafina Royals MD  ? ?  ? ? ?Encounter Date: 02/24/2022 ? ?Check In: ? Session Check In - 02/24/22 0943   ? ?  ? Check-In  ? Supervising physician immediately available to respond to emergencies See telemetry face sheet for immediately available ER MD   ? Location [facility]-Cardiac & Pulmonary Rehab   ? Staff Present Birdie Sons, MPA, RN;Jessica [place], MA, RCEP, CCRP, CCET;Amanda Sommer, BA, ACSM CEP, Exercise Physiologist   ? Virtual Visit No   ? Medication changes reported     No   ? Fall or balance concerns reported    No   ? Warm-up and Cool-down Performed on first and last piece of equipment   ? Resistance Training Performed Yes   ? VAD Patient? No   ? PAD/SET Patient? No   ?  ? Pain Assessment  ? Currently in Pain? No/denies   ? ?  ?  ? ?  ? ? ? ? ? ?Social History  ? ?Tobacco Use  ?Smoking Status Former  ? Packs/day: 0.25  ? Years: 3.00  ? Pack years: 0.75  ? Types: Cigarettes  ? Quit date: 10/21/1965  ? Years since quitting: 56.3  ?Smokeless Tobacco Never  ? ? ?Goals Met:  ?Independence with exercise equipment ?Exercise tolerated well ?No report of concerns or symptoms today ?Strength training completed today ? ?Goals Unmet:  ?Not Applicable ? ?Comments: Pt able to follow exercise prescription today without complaint.  Will continue to monitor for progression. ? ? ? ?Dr. Emily Filbert is Medical Director for [place].  ?Dr. Ottie Glazier is Medical Director for [facility] Pulmonary Rehabilitation. ?

## 2022-03-02 ENCOUNTER — Ambulatory Visit: Payer: PPO | Admitting: Family

## 2022-03-03 ENCOUNTER — Other Ambulatory Visit: Payer: Self-pay

## 2022-03-03 DIAGNOSIS — I5032 Chronic diastolic (congestive) heart failure: Secondary | ICD-10-CM

## 2022-03-03 NOTE — Progress Notes (Signed)
Daily Session Note ? ?Patient Details  ?Name: Kelsey Carter ?MRN: [id number] ?Date of Birth: 27-Aug-1946 ?Referring Provider:   ?Flowsheet Row Pulmonary Rehab from 11/27/2021 in [facility] Cardiac and Pulmonary Rehab  ?Referring Provider Serafina Royals MD  ? ?  ? ? ?Encounter Date: 03/03/2022 ? ?Check In: ? Session Check In - 03/03/22 0940   ? ?  ? Check-In  ? Supervising physician immediately available to respond to emergencies See telemetry face sheet for immediately available ER MD   ? Location [facility]-Cardiac & Pulmonary Rehab   ? Staff Present Birdie Sons, MPA, RN;Jessica Gilbert, MA, RCEP, CCRP, CCET;Amanda Sommer, BA, ACSM CEP, Exercise Physiologist   ? Virtual Visit No   ? Medication changes reported     No   ? Fall or balance concerns reported    No   ? Warm-up and Cool-down Performed on first and last piece of equipment   ? Resistance Training Performed Yes   ? VAD Patient? No   ? PAD/SET Patient? No   ?  ? Pain Assessment  ? Currently in Pain? No/denies   ? ?  ?  ? ?  ? ? ? ? ? ?Social History  ? ?Tobacco Use  ?Smoking Status Former  ? Packs/day: 0.25  ? Years: 3.00  ? Pack years: 0.75  ? Types: Cigarettes  ? Quit date: 10/21/1965  ? Years since quitting: 92.4  ?Smokeless Tobacco Never  ? ? ?Goals Met:  ?Independence with exercise equipment ?Exercise tolerated well ?No report of concerns or symptoms today ?Strength training completed today ? ?Goals Unmet:  ?Not Applicable ? ?Comments: Pt able to follow exercise prescription today without complaint.  Will continue to monitor for progression. ? ? ? ?Dr. Emily Filbert is Medical Director for [name].  ?Dr. Ottie Glazier is Medical Director for [name] Pulmonary Rehabilitation. ?

## 2022-03-05 ENCOUNTER — Other Ambulatory Visit: Payer: Self-pay

## 2022-03-05 DIAGNOSIS — I5032 Chronic diastolic (congestive) heart failure: Secondary | ICD-10-CM | POA: Diagnosis not present

## 2022-03-05 NOTE — Progress Notes (Signed)
Daily Session Note ? ?Patient Details  ?Name: Kelsey Carter ?MRN: [id number] ?Date of Birth: [birth year] ?Referring Provider:   ?Flowsheet Row Pulmonary Rehab from 11/27/2021 in [facility] Cardiac and Pulmonary Rehab  ?Referring Provider Serafina Royals MD  ? ?  ? ? ?Encounter Date: 03/05/2022 ? ?Check In: ? Session Check In - 03/05/22 0956   ? ?  ? Check-In  ? Supervising physician immediately available to respond to emergencies See telemetry face sheet for immediately available ER MD   ? Location [facility]-Cardiac & Pulmonary Rehab   ? Staff Present Birdie Sons, MPA, RN;Fynlee Rowlands Amedeo Plenty, BS, ACSM CEP, Exercise Physiologist;Jessica [place], MA, RCEP, CCRP, CCET;Joseph Cool[place], [place]   ? Virtual Visit No   ? Medication changes reported     No   ? Fall or balance concerns reported    No   ? Warm-up and Cool-down Performed on first and last piece of equipment   ? Resistance Training Performed Yes   ? VAD Patient? No   ? PAD/SET Patient? No   ?  ? Pain Assessment  ? Currently in Pain? No/denies   ? ?  ?  ? ?  ? ? ? ? ? ?Social History  ? ?Tobacco Use  ?Smoking Status Former  ? Packs/day: 0.25  ? Years: 3.00  ? Pack years: 0.75  ? Types: Cigarettes  ? Quit date: 10/21/1965  ? Years since quitting: 67.4  ?Smokeless Tobacco Never  ? ? ?Goals Met:  ?Independence with exercise equipment ?Exercise tolerated well ?No report of concerns or symptoms today ?Strength training completed today ? ?Goals Unmet:  ?Not Applicable ? ?Comments: Pt able to follow exercise prescription today without complaint.  Will continue to monitor for progression. ? ? ? ?Dr. Emily Filbert is Medical Director for [place].  ?Dr. Ottie Glazier is Medical Director for [facility] Pulmonary Rehabilitation. ?

## 2022-03-10 ENCOUNTER — Other Ambulatory Visit: Payer: Self-pay

## 2022-03-10 DIAGNOSIS — I5032 Chronic diastolic (congestive) heart failure: Secondary | ICD-10-CM | POA: Diagnosis not present

## 2022-03-10 NOTE — Progress Notes (Signed)
Daily Session Note ? ?Patient Details  ?Name: Kelsey Carter ?MRN: [id number] ?Date of Birth: [birth year] ?Referring Provider:   ?Flowsheet Row Pulmonary Rehab from 11/27/2021 in [facility] Cardiac and Pulmonary Rehab  ?Referring Provider Serafina Royals MD  ? ?  ? ? ?Encounter Date: 03/10/2022 ? ?Check In: ? Session Check In - 03/10/22 0946   ? ?  ? Check-In  ? Supervising physician immediately available to respond to emergencies See telemetry face sheet for immediately available ER MD   ? Location [facility]-Cardiac & Pulmonary Rehab   ? Staff Present Birdie Sons, MPA, RN;Jessica [place], MA, RCEP, CCRP, CCET;Amanda Sommer, BA, ACSM CEP, Exercise Physiologist   ? Virtual Visit No   ? Medication changes reported     No   ? Fall or balance concerns reported    No   ? Warm-up and Cool-down Performed on first and last piece of equipment   ? Resistance Training Performed Yes   ? VAD Patient? No   ? PAD/SET Patient? No   ?  ? Pain Assessment  ? Currently in Pain? No/denies   ? ?  ?  ? ?  ? ? ? ? ? ?Social History  ? ?Tobacco Use  ?Smoking Status Former  ? Packs/day: 0.25  ? Years: 3.00  ? Pack years: 0.75  ? Types: Cigarettes  ? Quit date: 10/21/1965  ? Years since quitting: 78.4  ?Smokeless Tobacco Never  ? ? ?Goals Met:  ?Independence with exercise equipment ?Exercise tolerated well ?No report of concerns or symptoms today ?Strength training completed today ? ?Goals Unmet:  ?Not Applicable ? ?Comments: Pt able to follow exercise prescription today without complaint.  Will continue to monitor for progression. ? ? ? ?Dr. Emily Filbert is Medical Director for [place].  ?Dr. Ottie Glazier is Medical Director for [facility] Pulmonary Rehabilitation. ?

## 2022-03-11 ENCOUNTER — Encounter: Payer: Self-pay | Admitting: *Deleted

## 2022-03-11 DIAGNOSIS — I5032 Chronic diastolic (congestive) heart failure: Secondary | ICD-10-CM

## 2022-03-11 NOTE — Progress Notes (Signed)
Pulmonary Individual Treatment Plan ? ?Patient Details  ?Name: Kelsey Carter ?MRN: [id number] ?Date of Birth: [birth year] ?Referring Provider:   ?Flowsheet Row Pulmonary Rehab from 11/27/2021 in [facility] Cardiac and Pulmonary Rehab  ?Referring Provider Serafina Royals MD  ? ?  ? ? ?Initial Encounter Date:  ?Flowsheet Row Pulmonary Rehab from 11/27/2021 in [facility] Cardiac and Pulmonary Rehab  ?Date 11/27/21  ? ?  ? ? ?Visit Diagnosis: Heart failure, diastolic, chronic ([place]) ? ?Patient's Home Medications on Admission: ? ?Current Outpatient Medications:  ?  alendronate (FOSAMAX) 70 MG tablet, Take 70 mg by mouth once a week., Disp: , Rfl:  ?  amiodarone (PACERONE) 200 MG tablet, Take 200 mg by mouth 2 (two) times daily., Disp: , Rfl:  ?  atorvastatin (LIPITOR) 10 MG tablet, Take 1 tablet (10 mg total) by mouth daily., Disp: 30 tablet, Rfl: 0 ?  Cholecalciferol (VITAMIN D3 PO), Take 1 tablet by mouth in the morning., Disp: , Rfl:  ?  ELIQUIS 5 MG TABS tablet, Take 5 mg by mouth 2 (two) times daily., Disp: , Rfl:  ?  furosemide (LASIX) 20 MG tablet, Take 1 tablet (20 mg total) by mouth daily., Disp: 30 tablet, Rfl: 1 ?  levothyroxine (SYNTHROID) 25 MCG tablet, Take 25 mcg by mouth in the morning., Disp: , Rfl:  ?  MAGNESIUM PO, Take 1 tablet by mouth in the morning., Disp: , Rfl:  ?  meclizine (ANTIVERT) 12.5 MG tablet, Take 12.5 mg by mouth 3 (three) times daily., Disp: , Rfl:  ?  metoprolol succinate (TOPROL-XL) 25 MG 24 hr tablet, Take 0.5 tablets (12.5 mg total) by mouth 2 (two) times daily., Disp: 30 tablet, Rfl: 0 ?  Potassium Citrate 99 MG CAPS, Take 1 tablet by mouth daily., Disp: , Rfl:  ? ?Past Medical History: ?Past Medical History:  ?Diagnosis Date  ? Anemia   ? vitamin d deficiency  ? Anxiety   ? Breast cancer ([place])   ? bilateral mastectomies.  different types of cancer in each breast  ? 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 Chronic anticoagulation   ? Apixaban  ? Chronic pain 2019  ? Chronic SI joint  pain   ? Coronary artery disease   ? DDD (degenerative disc disease), lumbar   ? First degree AV block   ? GERD (gastroesophageal reflux disease)   ? Hyperlipidemia   ? Hypertension   ? Insomnia   ? Lumbar herniated disc   ? Neuropathy   ? Paroxysmal atrial fibrillation (HCC)   ? Personal history of chemotherapy   ? Personal history of radiation therapy   ? S/P drug eluting coronary stent placement 08/02/2019  ? 2.5 x 12 mm Resolute Onyx to pLAD  ? Valvular insufficiency   ? Mild  ? ? ?Tobacco Use: ?Social History  ? ?Tobacco Use  ?Smoking Status Former  ? Packs/day: 0.25  ? Years: 3.00  ? Pack years: 0.75  ? Types: Cigarettes  ? Quit date: 10/21/1965  ? Years since quitting: 26.4  ?Smokeless Tobacco Never  ? ? ?Labs: ?Recent Review Flowsheet Data   ? ? Labs for ITP Cardiac and Pulmonary Rehab Latest Ref Rng & Units 04/24/2013 08/24/2017 03/22/2018 09/21/2018  ? Cholestrol 0 - 200 mg/dL 160 117 - 130  ? LDLCALC 0 - 99 mg/dL 87 48 - 59  ? HDL >40 mg/dL 54 53 - 49  ? Trlycerides <150 mg/dL 97 82 - 108  ? Hemoglobin A1c 4.8 - 5.6 % - -  5.9(H) 5.6  ? ?  ? ? ? ?Pulmonary Assessment Scores: ? Pulmonary Assessment Scores   ? ? [place] Name 11/27/21 1112  ?  ?  ?  ? ADL UCSD  ? ADL Phase Entry    ? SOB Score total 14    ? Rest 0    ? Walk 4    ? Stairs 4    ? Bath 0    ? Dress 0    ? Shop 1    ?  ? CAT Score  ? CAT Score 3    ?  ? mMRC Score  ? mMRC Score 1    ? ?  ?  ? ?  ?  ?UCSD: ?Self-administered rating of dyspnea associated with activities of daily living (ADLs) ?6-point scale (0 = "not at all" to 5 = "maximal or unable to do because of breathlessness")  ?Scoring Scores range from 0 to 120.  Minimally important difference is 5 units ? ?CAT: ?CAT can identify the health impairment of COPD patients and is better correlated with disease progression.  ?CAT has a scoring range of zero to 40. The CAT score is classified into four groups of low (less than 10), medium (10 - 20), high (21-30) and very high (31-40) based on the impact  level of disease on health status. A CAT score over 10 suggests significant symptoms.  A worsening CAT score could be explained by an exacerbation, poor medication adherence, poor inhaler technique, or progression of COPD or comorbid conditions.  ?CAT MCID is 2 points ? ?mMRC: ?mMRC (Modified Medical Research Council) Dyspnea Scale is used to assess the degree of baseline functional disability in patients of respiratory disease due to dyspnea. ?No minimal important difference is established. A decrease in score of 1 point or greater is considered a positive change.  ? ?Pulmonary Function Assessment: ? Pulmonary Function Assessment - 11/17/21 1551   ? ?  ? Breath  ? Shortness of Breath Yes;Limiting activity   ? ?  ?  ? ?  ? ? ?Exercise Target Goals: ?Exercise Program Goal: ?Individual exercise prescription set using results from initial 6 min walk test and THRR while considering  patient?s activity barriers and safety.  ? ?Exercise Prescription Goal: ?Initial exercise prescription builds to 30-45 minutes a day of aerobic activity, 2-3 days per week.  Home exercise guidelines will be given to patient during program as part of exercise prescription that the participant will acknowledge. ? ?Education: Aerobic Exercise: ?- Group verbal and visual presentation on the components of exercise prescription. Introduces F.I.T.T principle from ACSM for exercise prescriptions.  Reviews F.I.T.T. principles of aerobic exercise including progression. Written material given at graduation. ?Flowsheet Row Pulmonary Rehab from 12/04/2021 in [facility] Cardiac and Pulmonary Rehab  ?Education need identified 11/27/21  ? ?  ? ? ?Education: Resistance Exercise: ?- Group verbal and visual presentation on the components of exercise prescription. Introduces F.I.T.T principle from ACSM for exercise prescriptions  Reviews F.I.T.T. principles of resistance exercise including progression. Written material given at graduation. ? ?  ?Education: Exercise &  Equipment Safety: ?- Individual verbal instruction and demonstration of equipment use and safety with use of the equipment. ?Flowsheet Row Pulmonary Rehab from 11/17/2021 in [facility] Cardiac and Pulmonary Rehab  ?Date 11/17/21  ?Educator [facility]  ?Instruction Review Code 1- Verbalizes Understanding  ? ?  ? ? ?Education: Exercise Physiology & General Exercise Guidelines: ?- Group verbal and written instruction with models to review the exercise physiology of the cardiovascular system  and associated critical values. Provides general exercise guidelines with specific guidelines to those with heart or lung disease.  ? ? ?Education: Flexibility, Balance, Mind/Body Relaxation: ?- Group verbal and visual presentation with interactive activity on the components of exercise prescription. Introduces F.I.T.T principle from ACSM for exercise prescriptions. Reviews F.I.T.T. principles of flexibility and balance exercise training including progression. Also discusses the mind body connection.  Reviews various relaxation techniques to help reduce and manage stress (i.e. Deep breathing, progressive muscle relaxation, and visualization). Balance handout provided to take home. Written material given at graduation. ? ? ?Activity Barriers & Risk Stratification: ? Activity Barriers & Cardiac Risk Stratification - 11/27/21 1106   ? ?  ? Activity Barriers & Cardiac Risk Stratification  ? Activity Barriers Balance Concerns;Muscular Weakness;Deconditioning;History of Falls;Other (comment)   ? Comments Neuropathy bilateral legs   ? ?  ?  ? ?  ? ? ?6 Minute Walk: ? 6 Minute Walk   ? ? [place] Name 11/27/21 1118  ?  ?  ?  ? 6 Minute Walk  ? Phase Initial    ? Distance 1105 feet    ? Walk Time 6 minutes    ? # of Rest Breaks 0    ? MPH 2.09    ? METS 2.13    ? RPE 11    ? Perceived Dyspnea  0    ? VO2 Peak 7.45    ? Symptoms No    ? Resting HR 65 bpm    ? Resting BP 124/68    ? Resting Oxygen Saturation  97 %    ? Exercise Oxygen Saturation  during 6 min  walk 95 %    ? Max Ex. HR 86 bpm    ? Max Ex. BP 132/70    ? 2 Minute Post BP 130/68    ?  ? Interval HR  ? 1 Minute HR 77    ? 2 Minute HR 82    ? 3 Minute HR 85    ? 4 Minute HR 86    ? 5 Minute HR 85

## 2022-03-12 ENCOUNTER — Other Ambulatory Visit: Payer: Self-pay

## 2022-03-12 DIAGNOSIS — I5032 Chronic diastolic (congestive) heart failure: Secondary | ICD-10-CM

## 2022-03-12 NOTE — Progress Notes (Signed)
Daily Session Note ? ?Patient Details  ?Name: Kelsey Carter ?MRN: [id number] ?Date of Birth: September 04, 1946 ?Referring Provider:   ?Flowsheet Row Pulmonary Rehab from 11/27/2021 in [facility] Cardiac and Pulmonary Rehab  ?Referring Provider Serafina Royals MD  ? ?  ? ? ?Encounter Date: 03/12/2022 ? ?Check In: ? Session Check In - 03/12/22 0953   ? ?  ? Check-In  ? Supervising physician immediately available to respond to emergencies See telemetry face sheet for immediately available ER MD   ? Location [facility]-Cardiac & Pulmonary Rehab   ? Staff Present Birdie Sons, MPA, Nino Glow, MS, ASCM CEP, Exercise Physiologist;Kadeidra Coryell Amedeo Plenty, BS, ACSM CEP, Exercise Physiologist;Susanne Bice, RN, BSN, CCRP   ? Virtual Visit No   ? Medication changes reported     No   ? Fall or balance concerns reported    No   ? Warm-up and Cool-down Performed on first and last piece of equipment   ? Resistance Training Performed Yes   ? VAD Patient? No   ? PAD/SET Patient? No   ?  ? Pain Assessment  ? Currently in Pain? No/denies   ? ?  ?  ? ?  ? ? ? ? ? ?Social History  ? ?Tobacco Use  ?Smoking Status Former  ? Packs/day: 0.25  ? Years: 3.00  ? Pack years: 0.75  ? Types: Cigarettes  ? Quit date: 10/21/1965  ? Years since quitting: 74.4  ?Smokeless Tobacco Never  ? ? ?Goals Met:  ?Independence with exercise equipment ?Exercise tolerated well ?No report of concerns or symptoms today ?Strength training completed today ? ?Goals Unmet:  ?Not Applicable ? ?Comments: Pt able to follow exercise prescription today without complaint.  Will continue to monitor for progression. ? ? ? ?Dr. Emily Filbert is Medical Director for [place].  ?Dr. Ottie Glazier is Medical Director for [facility] Pulmonary Rehabilitation. ?

## 2022-03-19 DIAGNOSIS — I5032 Chronic diastolic (congestive) heart failure: Secondary | ICD-10-CM

## 2022-03-19 NOTE — Progress Notes (Signed)
Daily Session Note ? ?Patient Details  ?Name: Kelsey Carter ?MRN: [id number] ?Date of Birth: 1946/05/15 ?Referring Provider:   ?Flowsheet Row Pulmonary Rehab from 11/27/2021 in [facility] Cardiac and Pulmonary Rehab  ?Referring Provider Serafina Royals MD  ? ?  ? ? ?Encounter Date: 03/19/2022 ? ?Check In: ? Session Check In - 03/19/22 0944   ? ?  ? Check-In  ? Supervising physician immediately available to respond to emergencies See telemetry face sheet for immediately available ER MD   ? Location [facility]-Cardiac & Pulmonary Rehab   ? Staff Present Birdie Sons, MPA, RN;Amanda Sommer, BA, ACSM CEP, Exercise Physiologist;Melissa Springdale, RDN, Tawanna Solo, MS, ASCM CEP, Exercise Physiologist   ? Virtual Visit No   ? Medication changes reported     No   ? Fall or balance concerns reported    No   ? Warm-up and Cool-down Performed on first and last piece of equipment   ? Resistance Training Performed Yes   ? VAD Patient? No   ? PAD/SET Patient? No   ?  ? Pain Assessment  ? Currently in Pain? No/denies   ? ?  ?  ? ?  ? ? ? ? ? ?Social History  ? ?Tobacco Use  ?Smoking Status Former  ? Packs/day: 0.25  ? Years: 3.00  ? Pack years: 0.75  ? Types: Cigarettes  ? Quit date: 10/21/1965  ? Years since quitting: 40.4  ?Smokeless Tobacco Never  ? ? ?Goals Met:  ?Independence with exercise equipment ?Exercise tolerated well ?No report of concerns or symptoms today ?Strength training completed today ? ?Goals Unmet:  ?Not Applicable ? ?Comments: Pt able to follow exercise prescription today without complaint.  Will continue to monitor for progression. ? ? ? ?Dr. Emily Filbert is Medical Director for [place].  ?Dr. Ottie Glazier is Medical Director for [facility] Pulmonary Rehabilitation. ?

## 2022-03-24 ENCOUNTER — Encounter: Payer: PPO | Attending: Internal Medicine

## 2022-03-24 DIAGNOSIS — I5032 Chronic diastolic (congestive) heart failure: Secondary | ICD-10-CM | POA: Insufficient documentation

## 2022-03-24 NOTE — Progress Notes (Signed)
Daily Session Note ? ?Patient Details  ?Name: Kelsey Carter ?MRN: [id number] ?Date of Birth: 05/10/1946 ?Referring Provider:   ?Flowsheet Row Pulmonary Rehab from 11/27/2021 in [facility] Cardiac and Pulmonary Rehab  ?Referring Provider Kowalski, Bruce MD  ? ?  ? ? ?Encounter Date: 03/24/2022 ? ?Check In: ? Session Check In - 03/24/22 0945   ? ?  ? Check-In  ? Supervising physician immediately available to respond to emergencies See telemetry face sheet for immediately available ER MD   ? Location [facility]-Cardiac & Pulmonary Rehab   ? Staff Present Kelly Bollinger, MPA, RN;Amanda Sommer, BA, ACSM CEP, Exercise Physiologist;Jessica Hawkins, MA, RCEP, CCRP, CCET   ? Virtual Visit No   ? Medication changes reported     No   ? Fall or balance concerns reported    No   ? Warm-up and Cool-down Performed on first and last piece of equipment   ? Resistance Training Performed Yes   ? VAD Patient? No   ? PAD/SET Patient? No   ?  ? Pain Assessment  ? Currently in Pain? No/denies   ? ?  ?  ? ?  ? ? ? ? ? ?Social History  ? ?Tobacco Use  ?Smoking Status Former  ? Packs/day: 0.25  ? Years: 3.00  ? Pack years: 0.75  ? Types: Cigarettes  ? Quit date: 10/21/1965  ? Years since quitting: 56.4  ?Smokeless Tobacco Never  ? ? ?Goals Met:  ?Independence with exercise equipment ?Exercise tolerated well ?No report of concerns or symptoms today ?Strength training completed today ? ?Goals Unmet:  ?Not Applicable ? ?Comments: Pt able to follow exercise prescription today without complaint.  Will continue to monitor for progression. ? ? ? ?Dr. Mark Miller is Medical Director for [facility] Cardiac Rehabilitation.  ?Dr. Fuad Aleskerov is Medical Director for [facility] Pulmonary Rehabilitation. ?

## 2022-04-07 DIAGNOSIS — I5032 Chronic diastolic (congestive) heart failure: Secondary | ICD-10-CM | POA: Diagnosis not present

## 2022-04-07 NOTE — Progress Notes (Signed)
Daily Session Note ? ?Patient Details  ?Name: ANTHEA UDOVICH ?MRN: [id number] ?Date of Birth: 05-Jun-1946 ?Referring Provider:   ?Flowsheet Row Pulmonary Rehab from 11/27/2021 in [facility] Cardiac and Pulmonary Rehab  ?Referring Provider Serafina Royals MD  ? ?  ? ? ?Encounter Date: 04/07/2022 ? ?Check In: ? Session Check In - 04/07/22 0949   ? ?  ? Check-In  ? Supervising physician immediately available to respond to emergencies See telemetry face sheet for immediately available ER MD   ? Location [facility]-Cardiac & Pulmonary Rehab   ? Staff Present Birdie Sons, MPA, RN;Jessica [place], MA, RCEP, CCRP, CCET;Amanda Sommer, BA, ACSM CEP, Exercise Physiologist   ? Virtual Visit No   ? Medication changes reported     No   ? Fall or balance concerns reported    No   ? Warm-up and Cool-down Performed on first and last piece of equipment   ? Resistance Training Performed Yes   ? VAD Patient? No   ? PAD/SET Patient? No   ?  ? Pain Assessment  ? Currently in Pain? No/denies   ? ?  ?  ? ?  ? ? ? ? ? ?Social History  ? ?Tobacco Use  ?Smoking Status Former  ? Packs/day: 0.25  ? Years: 3.00  ? Pack years: 0.75  ? Types: Cigarettes  ? Quit date: 10/21/1965  ? Years since quitting: 104.4  ?Smokeless Tobacco Never  ? ? ?Goals Met:  ?Independence with exercise equipment ?Exercise tolerated well ?No report of concerns or symptoms today ?Strength training completed today ? ?Goals Unmet:  ?Not Applicable ? ?Comments: Pt able to follow exercise prescription today without complaint.  Will continue to monitor for progression. ? ? ? ?Dr. Emily Filbert is Medical Director for [place].  ?Dr. Ottie Glazier is Medical Director for [facility] Pulmonary Rehabilitation. ?

## 2022-04-08 ENCOUNTER — Encounter: Payer: Self-pay | Admitting: *Deleted

## 2022-04-08 DIAGNOSIS — I5032 Chronic diastolic (congestive) heart failure: Secondary | ICD-10-CM

## 2022-04-08 NOTE — Progress Notes (Signed)
Pulmonary Individual Treatment Plan ? ?Patient Details  ?Name: Kelsey Carter ?MRN: [id number] ?Date of Birth: 1946-03-02 ?Referring Provider:   ?Flowsheet Row Pulmonary Rehab from 11/27/2021 in [facility] Cardiac and Pulmonary Rehab  ?Referring Provider Serafina Royals MD  ? ?  ? ? ?Initial Encounter Date:  ?Flowsheet Row Pulmonary Rehab from 11/27/2021 in [facility] Cardiac and Pulmonary Rehab  ?Date 11/27/21  ? ?  ? ? ?Visit Diagnosis: Heart failure, diastolic, chronic ([place]) ? ?Patient's Home Medications on Admission: ? ?Current Outpatient Medications:  ?  alendronate (FOSAMAX) 70 MG tablet, Take 70 mg by mouth once a week., Disp: , Rfl:  ?  amiodarone (PACERONE) 200 MG tablet, Take 200 mg by mouth 2 (two) times daily., Disp: , Rfl:  ?  atorvastatin (LIPITOR) 10 MG tablet, Take 1 tablet (10 mg total) by mouth daily., Disp: 30 tablet, Rfl: 0 ?  Cholecalciferol (VITAMIN D3 PO), Take 1 tablet by mouth in the morning., Disp: , Rfl:  ?  ELIQUIS 5 MG TABS tablet, Take 5 mg by mouth 2 (two) times daily., Disp: , Rfl:  ?  furosemide (LASIX) 20 MG tablet, Take 1 tablet (20 mg total) by mouth daily., Disp: 30 tablet, Rfl: 1 ?  levothyroxine (SYNTHROID) 25 MCG tablet, Take 25 mcg by mouth in the morning., Disp: , Rfl:  ?  MAGNESIUM PO, Take 1 tablet by mouth in the morning., Disp: , Rfl:  ?  meclizine (ANTIVERT) 12.5 MG tablet, Take 12.5 mg by mouth 3 (three) times daily., Disp: , Rfl:  ?  metoprolol succinate (TOPROL-XL) 25 MG 24 hr tablet, Take 0.5 tablets (12.5 mg total) by mouth 2 (two) times daily., Disp: 30 tablet, Rfl: 0 ?  Potassium Citrate 99 MG CAPS, Take 1 tablet by mouth daily., Disp: , Rfl:  ? ?Past Medical History: ?Past Medical History:  ?Diagnosis Date  ? Anemia   ? vitamin d deficiency  ? Anxiety   ? Breast cancer ([place])   ? bilateral mastectomies.  different types of cancer in each breast  ? Cardiomyopathy due to chemotherapy [facility]) 2011  ? Chronic anticoagulation   ? Apixaban  ? Chronic pain 2019  ? Chronic SI joint  pain   ? Coronary artery disease   ? DDD (degenerative disc disease), lumbar   ? First degree AV block   ? GERD (gastroesophageal reflux disease)   ? Hyperlipidemia   ? Hypertension   ? Insomnia   ? Lumbar herniated disc   ? Neuropathy   ? Paroxysmal atrial fibrillation (HCC)   ? Personal history of chemotherapy   ? Personal history of radiation therapy   ? S/P drug eluting coronary stent placement 08/02/2019  ? 2.5 x 12 mm Resolute Onyx to pLAD  ? Valvular insufficiency   ? Mild  ? ? ?Tobacco Use: ?Social History  ? ?Tobacco Use  ?Smoking Status Former  ? Packs/day: 0.25  ? Years: 3.00  ? Pack years: 0.75  ? Types: Cigarettes  ? Quit date: 10/21/1965  ? Years since quitting: 56.5  ?Smokeless Tobacco Never  ? ? ?Labs: ?Review Flowsheet   ? ?  ?  Latest Ref Rng & Units 04/24/2013 08/24/2017 03/22/2018 09/21/2018  ?Labs for [facility] Cardiac and Pulmonary Rehab  ?Cholestrol 0 - 200 mg/dL 160   117    130    ?LDL (calc) 0 - 99 mg/dL 87   48    59    ?HDL-C >40 mg/dL 54   53    49    ?  Trlycerides <150 mg/dL 97   82    108    ?Hemoglobin A1c 4.8 - 5.6 %   5.9   5.6    ?  ? ? Multiple values from one day are sorted in reverse-chronological order  ?  ?  ? ? ? ?Pulmonary Assessment Scores: ? Pulmonary Assessment Scores   ? ? [place] Name 11/27/21 1112  ?  ?  ?  ? ADL UCSD  ? ADL Phase Entry    ? SOB Score total 14    ? Rest 0    ? Walk 4    ? Stairs 4    ? Bath 0    ? Dress 0    ? Shop 1    ?  ? CAT Score  ? CAT Score 3    ?  ? mMRC Score  ? mMRC Score 1    ? ?  ?  ? ?  ?  ?UCSD: ?Self-administered rating of dyspnea associated with activities of daily living (ADLs) ?6-point scale (0 = "not at all" to 5 = "maximal or unable to do because of breathlessness")  ?Scoring Scores range from 0 to 120.  Minimally important difference is 5 units ? ?CAT: ?CAT can identify the health impairment of COPD patients and is better correlated with disease progression.  ?CAT has a scoring range of zero to 40. The CAT score is classified into four groups of low  (less than 10), medium (10 - 20), high (21-30) and very high (31-40) based on the impact level of disease on health status. A CAT score over 10 suggests significant symptoms.  A worsening CAT score could be explained by an exacerbation, poor medication adherence, poor inhaler technique, or progression of COPD or comorbid conditions.  ?CAT MCID is 2 points ? ?mMRC: ?mMRC (Modified Medical Research Council) Dyspnea Scale is used to assess the degree of baseline functional disability in patients of respiratory disease due to dyspnea. ?No minimal important difference is established. A decrease in score of 1 point or greater is considered a positive change.  ? ?Pulmonary Function Assessment: ? Pulmonary Function Assessment - 11/17/21 1551   ? ?  ? Breath  ? Shortness of Breath Yes;Limiting activity   ? ?  ?  ? ?  ? ? ?Exercise Target Goals: ?Exercise Program Goal: ?Individual exercise prescription set using results from initial 6 min walk test and THRR while considering  patient?s activity barriers and safety.  ? ?Exercise Prescription Goal: ?Initial exercise prescription builds to 30-45 minutes a day of aerobic activity, 2-3 days per week.  Home exercise guidelines will be given to patient during program as part of exercise prescription that the participant will acknowledge. ? ?Education: Aerobic Exercise: ?- Group verbal and visual presentation on the components of exercise prescription. Introduces F.I.T.T principle from ACSM for exercise prescriptions.  Reviews F.I.T.T. principles of aerobic exercise including progression. Written material given at graduation. ?Flowsheet Row Pulmonary Rehab from 12/04/2021 in [facility] Cardiac and Pulmonary Rehab  ?Education need identified 11/27/21  ? ?  ? ? ?Education: Resistance Exercise: ?- Group verbal and visual presentation on the components of exercise prescription. Introduces F.I.T.T principle from ACSM for exercise prescriptions  Reviews F.I.T.T. principles of resistance exercise  including progression. Written material given at graduation. ? ?  ?Education: Exercise & Equipment Safety: ?- Individual verbal instruction and demonstration of equipment use and safety with use of the equipment. ?Flowsheet Row Pulmonary Rehab from 11/17/2021 in [facility] Cardiac and Pulmonary Rehab  ?  Date 11/17/21  ?Educator [facility]  ?Instruction Review Code 1- Verbalizes Understanding  ? ?  ? ? ?Education: Exercise Physiology & General Exercise Guidelines: ?- Group verbal and written instruction with models to review the exercise physiology of the cardiovascular system and associated critical values. Provides general exercise guidelines with specific guidelines to those with heart or lung disease.  ? ? ?Education: Flexibility, Balance, Mind/Body Relaxation: ?- Group verbal and visual presentation with interactive activity on the components of exercise prescription. Introduces F.I.T.T principle from ACSM for exercise prescriptions. Reviews F.I.T.T. principles of flexibility and balance exercise training including progression. Also discusses the mind body connection.  Reviews various relaxation techniques to help reduce and manage stress (i.e. Deep breathing, progressive muscle relaxation, and visualization). Balance handout provided to take home. Written material given at graduation. ? ? ?Activity Barriers & Risk Stratification: ? Activity Barriers & Cardiac Risk Stratification - 11/27/21 1106   ? ?  ? Activity Barriers & Cardiac Risk Stratification  ? Activity Barriers Balance Concerns;Muscular Weakness;Deconditioning;History of Falls;Other (comment)   ? Comments Neuropathy bilateral legs   ? ?  ?  ? ?  ? ? ?6 Minute Walk: ? 6 Minute Walk   ? ? [place] Name 11/27/21 1118  ?  ?  ?  ? 6 Minute Walk  ? Phase Initial    ? Distance 1105 feet    ? Walk Time 6 minutes    ? # of Rest Breaks 0    ? MPH 2.09    ? METS 2.13    ? RPE 11    ? Perceived Dyspnea  0    ? VO2 Peak 7.45    ? Symptoms No    ? Resting HR 65 bpm    ? Resting BP  124/68    ? Resting Oxygen Saturation  97 %    ? Exercise Oxygen Saturation  during 6 min walk 95 %    ? Max Ex. HR 86 bpm    ? Max Ex. BP 132/70    ? 2 Minute Post BP 130/68    ?  ? Interval HR  ? 1 Min

## 2022-04-09 VITALS — Ht 63.2 in | Wt 146.3 lb

## 2022-04-09 DIAGNOSIS — I5032 Chronic diastolic (congestive) heart failure: Secondary | ICD-10-CM

## 2022-04-09 NOTE — Progress Notes (Signed)
Daily Session Note ? ?Patient Details  ?Name: Kelsey Carter ?MRN: [id number] ?Date of Birth: 05-06-1946 ?Referring Provider:   ?Flowsheet Row Pulmonary Rehab from 11/27/2021 in [facility] Cardiac and Pulmonary Rehab  ?Referring Provider Serafina Royals MD  ? ?  ? ? ?Encounter Date: 04/09/2022 ? ?Check In: ? Session Check In - 04/09/22 0940   ? ?  ? Check-In  ? Supervising physician immediately available to respond to emergencies See telemetry face sheet for immediately available ER MD   ? Location [facility]-Cardiac & Pulmonary Rehab   ? Staff Present Birdie Sons, MPA, RN;Melissa [place], RDN, Rowe Pavy, BA, ACSM CEP, Exercise Physiologist;Brette Cast Amedeo Plenty, BS, ACSM CEP, Exercise Physiologist   ? Virtual Visit No   ? Medication changes reported     No   ? Fall or balance concerns reported    No   ? Warm-up and Cool-down Performed on first and last piece of equipment   ? Resistance Training Performed Yes   ? VAD Patient? No   ? PAD/SET Patient? No   ?  ? Pain Assessment  ? Currently in Pain? No/denies   ? ?  ?  ? ?  ? ? ? ? ? ?Social History  ? ?Tobacco Use  ?Smoking Status Former  ? Packs/day: 0.25  ? Years: 3.00  ? Pack years: 0.75  ? Types: Cigarettes  ? Quit date: 10/21/1965  ? Years since quitting: 56.5  ?Smokeless Tobacco Never  ? ? ?Goals Met:  ?Independence with exercise equipment ?Exercise tolerated well ?No report of concerns or symptoms today ?Strength training completed today ? ?Goals Unmet:  ?Not Applicable ? ?Comments: Pt able to follow exercise prescription today without complaint.  Will continue to monitor for progression. ? ? 6 Minute Walk   ? ? [place] Name 11/27/21 1118 04/09/22 1014  ?  ?  ? 6 Minute Walk  ? Phase Initial Discharge   ? Distance 1105 feet 1400 feet   ? Distance % Change -- 26.7 %   ? Distance Feet Change -- 295 ft   ? Walk Time 6 minutes 6 minutes   ? # of Rest Breaks 0 0   ? MPH 2.09 2.65   ? METS 2.13 2.76   ? RPE 11 12   ? Perceived Dyspnea  0 2   ? VO2 Peak 7.45 9.64   ? Symptoms No Yes  (comment)   ? Comments -- SOB   ? Resting HR 65 bpm 79 bpm   ? Resting BP 124/68 100/62   ? Resting Oxygen Saturation  97 % 96 %   ? Exercise Oxygen Saturation  during 6 min walk 95 % 93 %   ? Max Ex. HR 86 bpm 105 bpm   ? Max Ex. BP 132/70 124/56   ? 2 Minute Post BP 130/68 --   ?  ? Interval HR  ? 1 Minute HR 77 85   ? 2 Minute HR 82 94   ? 3 Minute HR 85 99   ? 4 Minute HR 86 103   ? 5 Minute HR 85 104   ? 6 Minute HR 83 105   ? 2 Minute Post HR 66 96   ? Interval Heart Rate? Yes Yes   ?  ? Interval Oxygen  ? Interval Oxygen? Yes Yes   ? Baseline Oxygen Saturation % 97 % 96 %   ? 1 Minute Oxygen Saturation % 97 % 93 %   ? 1 Minute Liters of Oxygen  0 L  RA 0 L  Room Air   ? 2 Minute Oxygen Saturation % 96 % 93 %   ? 2 Minute Liters of Oxygen 0 L 0 L   ? 3 Minute Oxygen Saturation % 95 % 97 %   ? 3 Minute Liters of Oxygen 0 L 0 L   ? 4 Minute Oxygen Saturation % 97 % 99 %   ? 4 Minute Liters of Oxygen 0 L 0 L   ? 5 Minute Oxygen Saturation % 97 % 97 %   ? 5 Minute Liters of Oxygen 0 L 0 L   ? 6 Minute Oxygen Saturation % 95 % 93 %   ? 6 Minute Liters of Oxygen 0 L 0 L   ? 2 Minute Post Oxygen Saturation % 98 % 97 %   ? 2 Minute Post Liters of Oxygen 0 L 0 L   ? ?  ?  ? ?  ? ? ? ?Dr. Emily Filbert is Medical Director for [place].  ?Dr. Ottie Glazier is Medical Director for [facility] Pulmonary Rehabilitation. ?

## 2022-04-14 DIAGNOSIS — I5032 Chronic diastolic (congestive) heart failure: Secondary | ICD-10-CM

## 2022-04-14 NOTE — Progress Notes (Signed)
Daily Session Note ? ?Patient Details  ?Name: Kelsey Carter ?MRN: [id number] ?Date of Birth: 01/16/1946 ?Referring Provider:   ?Flowsheet Row Pulmonary Rehab from 11/27/2021 in [facility] Cardiac and Pulmonary Rehab  ?Referring Provider Serafina Royals MD  ? ?  ? ? ?Encounter Date: 04/14/2022 ? ?Check In: ? Session Check In - 04/14/22 0947   ? ?  ? Check-In  ? Supervising physician immediately available to respond to emergencies See telemetry face sheet for immediately available ER MD   ? Location [facility]-Cardiac & Pulmonary Rehab   ? Staff Present Birdie Sons, MPA, RN;Jessica Luan Pulling, MA, RCEP, CCRP, CCET;Joseph [place], [place]   ? Virtual Visit No   ? Medication changes reported     No   ? Fall or balance concerns reported    No   ? Warm-up and Cool-down Performed on first and last piece of equipment   ? Resistance Training Performed Yes   ? VAD Patient? No   ? PAD/SET Patient? No   ?  ? Pain Assessment  ? Currently in Pain? No/denies   ? ?  ?  ? ?  ? ? ? ? ? ?Social History  ? ?Tobacco Use  ?Smoking Status Former  ? Packs/day: 0.25  ? Years: 3.00  ? Pack years: 0.75  ? Types: Cigarettes  ? Quit date: 10/21/1965  ? Years since quitting: 56.5  ?Smokeless Tobacco Never  ? ? ?Goals Met:  ?Independence with exercise equipment ?Exercise tolerated well ?No report of concerns or symptoms today ?Strength training completed today ? ?Goals Unmet:  ?Not Applicable ? ?Comments: Pt able to follow exercise prescription today without complaint.  Will continue to monitor for progression. ? ? ? ?Dr. Emily Filbert is Medical Director for [place].  ?Dr. Ottie Glazier is Medical Director for [facility] Pulmonary Rehabilitation. ?

## 2022-04-16 DIAGNOSIS — I5032 Chronic diastolic (congestive) heart failure: Secondary | ICD-10-CM | POA: Diagnosis not present

## 2022-04-16 NOTE — Patient Instructions (Signed)
Discharge Patient Instructions ? ?Patient Details  ?Name: Kelsey Carter ?MRN: [id number] ?Date of Birth: 1946-08-13 ?Referring Provider:  Corey Skains, MD ? ? ?Number of Visits: 54 ? ?Reason for Discharge:  ?Patient reached a stable level of exercise. ?Patient independent in their exercise. ?Patient has met program and personal goals. ? ?Smoking History:  ?Social History  ? ?Tobacco Use  ?Smoking Status Former  ? Packs/day: 0.25  ? Years: 3.00  ? Pack years: 0.75  ? Types: Cigarettes  ? Quit date: 10/21/1965  ? Years since quitting: 56.5  ?Smokeless Tobacco Never  ? ? ?Diagnosis:  ?Heart failure, diastolic, chronic ([place]) ? ?Initial Exercise Prescription: ? Initial Exercise Prescription - 11/27/21 1100   ? ?  ? Date of Initial Exercise RX and Referring Provider  ? Date 11/27/21   ? Referring Provider Serafina Royals MD   ?  ? Oxygen  ? Maintain Oxygen Saturation 88% or higher   ?  ? Recumbant Bike  ? Level 1   ? RPM 60   ? Watts 15   ? Minutes 15   ? METs 2.1   ?  ? NuStep  ? Level 2   ? SPM 80   ? Minutes 15   ? METs 2.1   ?  ? REL-XR  ? Level 1   ? Speed 50   ? Minutes 15   ? METs 2.1   ?  ? Track  ? Laps 21   ? Minutes 15   ? METs 2.14   ?  ? Prescription Details  ? Frequency (times per week) 2   ? Duration Progress to 30 minutes of continuous aerobic without signs/symptoms of physical distress   ?  ? Intensity  ? THRR 40-80% of Max Heartrate 96-128   ? Ratings of Perceived Exertion 11-13   ? Perceived Dyspnea 0-4   ?  ? Progression  ? Progression Continue to progress workloads to maintain intensity without signs/symptoms of physical distress.   ?  ? Resistance Training  ? Training Prescription Yes   ? Weight 3 lb   ? Reps 10-15   ? ?  ?  ? ?  ? ? ?Discharge Exercise Prescription (Final Exercise Prescription Changes): ? Exercise Prescription Changes - 04/08/22 1500   ? ?  ? Response to Exercise  ? Blood Pressure (Admit) 102/64   ? Blood Pressure (Exit) 104/64   ? Heart Rate (Admit) 82 bpm   ? Heart Rate  (Exercise) 93 bpm   ? Heart Rate (Exit) 79 bpm   ? Oxygen Saturation (Admit) 96 %   ? Oxygen Saturation (Exercise) 95 %   ? Oxygen Saturation (Exit) 96 %   ? Rating of Perceived Exertion (Exercise) 12   ? Perceived Dyspnea (Exercise) 0   ? Symptoms none   ? Duration Continue with 30 min of aerobic exercise without signs/symptoms of physical distress.   ? Intensity THRR unchanged   ?  ? Progression  ? Progression Continue to progress workloads to maintain intensity without signs/symptoms of physical distress.   ? Average METs 2.05   ?  ? Resistance Training  ? Training Prescription Yes   ? Weight 3 lb   ? Reps 10-15   ?  ? Interval Training  ? Interval Training No   ?  ? T5 Nustep  ? Level 2   ? Minutes 15   ? METs 1.8   ?  ? Biostep-RELP  ? Level 3   ?  Minutes 30   ? METs 2   ?  ? Track  ? Laps 25   ? Minutes 15   ? METs 2.36   ?  ? Home Exercise Plan  ? Plans to continue exercise at Home (comment)   walking  ? Frequency Add 2 additional days to program exercise sessions.   ? Initial Home Exercises Provided 12/23/21   ?  ? Oxygen  ? Maintain Oxygen Saturation 88% or higher   ? ?  ?  ? ?  ? ? ?Functional Capacity: ? 6 Minute Walk   ? ? [place] Name 11/27/21 1118 04/09/22 1014  ?  ?  ? 6 Minute Walk  ? Phase Initial Discharge   ? Distance 1105 feet 1400 feet   ? Distance % Change -- 26.7 %   ? Distance Feet Change -- 295 ft   ? Walk Time 6 minutes 6 minutes   ? # of Rest Breaks 0 0   ? MPH 2.09 2.65   ? METS 2.13 2.76   ? RPE 11 12   ? Perceived Dyspnea  0 2   ? VO2 Peak 7.45 9.64   ? Symptoms No Yes (comment)   ? Comments -- SOB   ? Resting HR 65 bpm 79 bpm   ? Resting BP 124/68 100/62   ? Resting Oxygen Saturation  97 % 96 %   ? Exercise Oxygen Saturation  during 6 min walk 95 % 93 %   ? Max Ex. HR 86 bpm 105 bpm   ? Max Ex. BP 132/70 124/56   ? 2 Minute Post BP 130/68 --   ?  ? Interval HR  ? 1 Minute HR 77 85   ? 2 Minute HR 82 94   ? 3 Minute HR 85 99   ? 4 Minute HR 86 103   ? 5 Minute HR 85 104   ? 6 Minute HR 83  105   ? 2 Minute Post HR 66 96   ? Interval Heart Rate? Yes Yes   ?  ? Interval Oxygen  ? Interval Oxygen? Yes Yes   ? Baseline Oxygen Saturation % 97 % 96 %   ? 1 Minute Oxygen Saturation % 97 % 93 %   ? 1 Minute Liters of Oxygen 0 L  RA 0 L  Room Air   ? 2 Minute Oxygen Saturation % 96 % 93 %   ? 2 Minute Liters of Oxygen 0 L 0 L   ? 3 Minute Oxygen Saturation % 95 % 97 %   ? 3 Minute Liters of Oxygen 0 L 0 L   ? 4 Minute Oxygen Saturation % 97 % 99 %   ? 4 Minute Liters of Oxygen 0 L 0 L   ? 5 Minute Oxygen Saturation % 97 % 97 %   ? 5 Minute Liters of Oxygen 0 L 0 L   ? 6 Minute Oxygen Saturation % 95 % 93 %   ? 6 Minute Liters of Oxygen 0 L 0 L   ? 2 Minute Post Oxygen Saturation % 98 % 97 %   ? 2 Minute Post Liters of Oxygen 0 L 0 L   ? ?  ?  ? ?  ? ? ?Quality of Life: ? ? ?Personal Goals: ?Goals established at orientation with interventions provided to work toward goal. ? Personal Goals and Risk Factors at Admission - 11/27/21 1140   ? ?  ? Core  Components/Risk Factors/Patient Goals on Admission  ?  Weight Management Yes;Weight Maintenance   Patient already lost weight and wants to maintain  ? Intervention Weight Management: Develop a combined nutrition and exercise program designed to reach desired caloric intake, while maintaining appropriate intake of nutrient and fiber, sodium and fats, and appropriate energy expenditure required for the weight goal.;Weight Management: Provide education and appropriate resources to help participant work on and attain dietary goals.;Weight Management/Obesity: Establish reasonable short term and long term weight goals.   ? Admit Weight 145 lb (65.8 kg)   ? Goal Weight: Short Term 145 lb (65.8 kg)   ? Goal Weight: Long Term 145 lb (65.8 kg)   ? Expected Outcomes Short Term: Continue to assess and modify interventions until short term weight is achieved;Long Term: Adherence to nutrition and physical activity/exercise program aimed toward attainment of established weight  goal;Understanding recommendations for meals to include 15-35% energy as protein, 25-35% energy from fat, 35-60% energy from carbohydrates, less than $RemoveB'200mg'BEZiQePs$  of dietary cholesterol, 20-35 gm of total fiber daily;Understanding of distribution of calorie intake throughout the day with the consumption of 4-5 meals/snacks;Weight Maintenance: Understanding of the daily nutrition guidelines, which includes 25-35% calories from fat, 7% or less cal from saturated fats, less than $RemoveB'200mg'USjDxAKw$  cholesterol, less than 1.5gm of sodium, & 5 or more servings of fruits and vegetables daily   ? Improve shortness of breath with ADL's Yes   ? Intervention Provide education, individualized exercise plan and daily activity instruction to help decrease symptoms of SOB with activities of daily living.   ? Expected Outcomes Short Term: Improve cardiorespiratory fitness to achieve a reduction of symptoms when performing ADLs;Long Term: Be able to perform more ADLs without symptoms or delay the onset of symptoms   ? Increase knowledge of respiratory medications and ability to use respiratory devices properly  Yes   ? Intervention Provide education and demonstration as needed of appropriate use of medications, inhalers, and oxygen therapy.   ? Expected Outcomes Short Term: Achieves understanding of medications use. Understands that oxygen is a medication prescribed by physician. Demonstrates appropriate use of inhaler and oxygen therapy.;Long Term: Maintain appropriate use of medications, inhalers, and oxygen therapy.   ? Hypertension Yes   ? Intervention Provide education on lifestyle modifcations including regular physical activity/exercise, weight management, moderate sodium restriction and increased consumption of fresh fruit, vegetables, and low fat dairy, alcohol moderation, and smoking cessation.;Monitor prescription use compliance.   ? Expected Outcomes Short Term: Continued assessment and intervention until BP is < 140/10mm HG in hypertensive  participants. < 130/101mm HG in hypertensive participants with diabetes, heart failure or chronic kidney disease.;Long Term: Maintenance of blood pressure at goal levels.   ? Lipids Yes   ? Intervention Provi

## 2022-04-16 NOTE — Progress Notes (Signed)
Daily Session Note ? ?Patient Details  ?Name: Kelsey Carter ?MRN: [id number] ?Date of Birth: 12-14-1946 ?Referring Provider:   ?Flowsheet Row Pulmonary Rehab from 11/27/2021 in [facility] Cardiac and Pulmonary Rehab  ?Referring Provider Serafina Royals MD  ? ?  ? ? ?Encounter Date: 04/16/2022 ? ?Check In: ? Session Check In - 04/16/22 0936   ? ?  ? Check-In  ? Supervising physician immediately available to respond to emergencies See telemetry face sheet for immediately available ER MD   ? Location [facility]-Cardiac & Pulmonary Rehab   ? Staff Present Birdie Sons, MPA, RN;Joseph Sussex, RCP,RRT,BSRT;Melissa Tuttle, RDN, LDN   ? Virtual Visit No   ? Medication changes reported     No   ? Fall or balance concerns reported    No   ? Warm-up and Cool-down Performed on first and last piece of equipment   ? Resistance Training Performed Yes   ? VAD Patient? No   ? PAD/SET Patient? No   ?  ? Pain Assessment  ? Currently in Pain? No/denies   ? ?  ?  ? ?  ? ? ? ? ? ?Social History  ? ?Tobacco Use  ?Smoking Status Former  ? Packs/day: 0.25  ? Years: 3.00  ? Pack years: 0.75  ? Types: Cigarettes  ? Quit date: 10/21/1965  ? Years since quitting: 56.5  ?Smokeless Tobacco Never  ? ? ?Goals Met:  ?Independence with exercise equipment ?Exercise tolerated well ?No report of concerns or symptoms today ?Strength training completed today ? ?Goals Unmet:  ?Not Applicable ? ?Comments: Pt able to follow exercise prescription today without complaint.  Will continue to monitor for progression. ? ? ? ?Dr. Emily Filbert is Medical Director for [place].  ?Dr. Ottie Glazier is Medical Director for [facility] Pulmonary Rehabilitation. ?

## 2022-04-21 ENCOUNTER — Encounter: Payer: PPO | Attending: Internal Medicine

## 2022-04-21 DIAGNOSIS — I5032 Chronic diastolic (congestive) heart failure: Secondary | ICD-10-CM | POA: Insufficient documentation

## 2022-04-21 NOTE — Progress Notes (Signed)
Daily Session Note ? ?Patient Details  ?Name: Kelsey Carter ?MRN: [id number] ?Date of Birth: 03-06-1946 ?Referring Provider:   ?Flowsheet Row Pulmonary Rehab from 11/27/2021 in [facility] Cardiac and Pulmonary Rehab  ?Referring Provider Serafina Royals MD  ? ?  ? ? ?Encounter Date: 04/21/2022 ? ?Check In: ? Session Check In - 04/21/22 0938   ? ?  ? Check-In  ? Supervising physician immediately available to respond to emergencies See telemetry face sheet for immediately available ER MD   ? Location [facility]-Cardiac & Pulmonary Rehab   ? Staff Present Birdie Sons, MPA, RN;Jessica Eden, MA, RCEP, CCRP, [place], BS, ACSM CEP, Exercise Physiologist   ? Virtual Visit No   ? Medication changes reported     No   ? Fall or balance concerns reported    No   ? Warm-up and Cool-down Performed on first and last piece of equipment   ? Resistance Training Performed Yes   ? VAD Patient? No   ? PAD/SET Patient? No   ?  ? Pain Assessment  ? Currently in Pain? No/denies   ? ?  ?  ? ?  ? ? ? ? ? ?Social History  ? ?Tobacco Use  ?Smoking Status Former  ? Packs/day: 0.25  ? Years: 3.00  ? Pack years: 0.75  ? Types: Cigarettes  ? Quit date: 10/21/1965  ? Years since quitting: 56.5  ?Smokeless Tobacco Never  ? ? ?Goals Met:  ?Independence with exercise equipment ?Exercise tolerated well ?No report of concerns or symptoms today ?Strength training completed today ? ?Goals Unmet:  ?Not Applicable ? ?Comments: Pt able to follow exercise prescription today without complaint.  Will continue to monitor for progression.' ? ? ?Dr. Emily Filbert is Medical Director for [place].  ?Dr. Ottie Glazier is Medical Director for [facility] Pulmonary Rehabilitation. ?

## 2022-04-23 DIAGNOSIS — I5032 Chronic diastolic (congestive) heart failure: Secondary | ICD-10-CM | POA: Diagnosis not present

## 2022-04-23 NOTE — Progress Notes (Signed)
Pulmonary Individual Treatment Plan ? ?Patient Details  ?Name: Kelsey Carter ?MRN: [id number] ?Date of Birth: 25-Oct-1946 ?Referring Provider:   ?Flowsheet Row Pulmonary Rehab from 11/27/2021 in [facility] Cardiac and Pulmonary Rehab  ?Referring Provider Serafina Royals MD  ? ?  ? ? ?Initial Encounter Date:  ?Flowsheet Row Pulmonary Rehab from 11/27/2021 in [facility] Cardiac and Pulmonary Rehab  ?Date 11/27/21  ? ?  ? ? ?Visit Diagnosis: Heart failure, diastolic, chronic ([place]) ? ?Patient's Home Medications on Admission: ? ?Current Outpatient Medications:  ?  alendronate (FOSAMAX) 70 MG tablet, Take 70 mg by mouth once a week., Disp: , Rfl:  ?  amiodarone (PACERONE) 200 MG tablet, Take 200 mg by mouth 2 (two) times daily., Disp: , Rfl:  ?  atorvastatin (LIPITOR) 10 MG tablet, Take 1 tablet (10 mg total) by mouth daily., Disp: 30 tablet, Rfl: 0 ?  Cholecalciferol (VITAMIN D3 PO), Take 1 tablet by mouth in the morning., Disp: , Rfl:  ?  ELIQUIS 5 MG TABS tablet, Take 5 mg by mouth 2 (two) times daily., Disp: , Rfl:  ?  furosemide (LASIX) 20 MG tablet, Take 1 tablet (20 mg total) by mouth daily., Disp: 30 tablet, Rfl: 1 ?  levothyroxine (SYNTHROID) 25 MCG tablet, Take 25 mcg by mouth in the morning., Disp: , Rfl:  ?  MAGNESIUM PO, Take 1 tablet by mouth in the morning., Disp: , Rfl:  ?  meclizine (ANTIVERT) 12.5 MG tablet, Take 12.5 mg by mouth 3 (three) times daily., Disp: , Rfl:  ?  metoprolol succinate (TOPROL-XL) 25 MG 24 hr tablet, Take 0.5 tablets (12.5 mg total) by mouth 2 (two) times daily., Disp: 30 tablet, Rfl: 0 ?  Potassium Citrate 99 MG CAPS, Take 1 tablet by mouth daily., Disp: , Rfl:  ? ?Past Medical History: ?Past Medical History:  ?Diagnosis Date  ? Anemia   ? vitamin d deficiency  ? Anxiety   ? Breast cancer ([place])   ? bilateral mastectomies.  different types of cancer in each breast  ? Cardiomyopathy due to chemotherapy [facility]) 2011  ? Chronic anticoagulation   ? Apixaban  ? Chronic pain 2019  ? Chronic SI joint  pain   ? Coronary artery disease   ? DDD (degenerative disc disease), lumbar   ? First degree AV block   ? GERD (gastroesophageal reflux disease)   ? Hyperlipidemia   ? Hypertension   ? Insomnia   ? Lumbar herniated disc   ? Neuropathy   ? Paroxysmal atrial fibrillation ([facility])   ? Personal history of chemotherapy   ? Personal history of radiation therapy   ? S/P drug eluting coronary stent placement 08/02/2019  ? 2.5 x 12 mm Resolute Onyx to pLAD  ? Valvular insufficiency   ? Mild  ? ? ?Tobacco Use: ?Social History  ? ?Tobacco Use  ?Smoking Status Former  ? Packs/day: 0.25  ? Years: 3.00  ? Pack years: 0.75  ? Types: Cigarettes  ? Quit date: 10/21/1965  ? Years since quitting: 56.5  ?Smokeless Tobacco Never  ? ? ?Labs: ?Review Flowsheet   ? ?  ?  Latest Ref Rng & Units 04/24/2013 08/24/2017 03/22/2018 09/21/2018  ?Labs for [facility] Cardiac and Pulmonary Rehab  ?Cholestrol 0 - 200 mg/dL 160   117    130    ?LDL (calc) 0 - 99 mg/dL 87   48    59    ?HDL-C >40 mg/dL 54   53    49    ?  Trlycerides <150 mg/dL 97   82    108    ?Hemoglobin A1c 4.8 - 5.6 %   5.9   5.6    ?  ? ? Multiple values from one day are sorted in reverse-chronological order  ?  ?  ? ? ? ?Pulmonary Assessment Scores: ? Pulmonary Assessment Scores   ? ? [place] Name 11/27/21 1112 04/09/22 1016 04/14/22 1034  ?  ? ADL UCSD  ? ADL Phase Entry Exit Exit  ? SOB Score total 14 -- 9  ? Rest 0 -- 0  ? Walk 4 -- 1  ? Stairs 4 -- 3  ? Bath 0 -- 0  ? Dress 0 -- 0  ? Shop 1 -- 0  ?  ? CAT Score  ? CAT Score 3 -- 8  ?  ? mMRC Score  ? mMRC Score 1 0 --  ? ?  ?  ? ?  ?  ?UCSD: ?Self-administered rating of dyspnea associated with activities of daily living (ADLs) ?6-point scale (0 = "not at all" to 5 = "maximal or unable to do because of breathlessness")  ?Scoring Scores range from 0 to 120.  Minimally important difference is 5 units ? ?CAT: ?CAT can identify the health impairment of COPD patients and is better correlated with disease progression.  ?CAT has a scoring range of zero to  40. The CAT score is classified into four groups of low (less than 10), medium (10 - 20), high (21-30) and very high (31-40) based on the impact level of disease on health status. A CAT score over 10 suggests significant symptoms.  A worsening CAT score could be explained by an exacerbation, poor medication adherence, poor inhaler technique, or progression of COPD or comorbid conditions.  ?CAT MCID is 2 points ? ?mMRC: ?mMRC (Modified Medical Research Council) Dyspnea Scale is used to assess the degree of baseline functional disability in patients of respiratory disease due to dyspnea. ?No minimal important difference is established. A decrease in score of 1 point or greater is considered a positive change.  ? ?Pulmonary Function Assessment: ? Pulmonary Function Assessment - 11/17/21 1551   ? ?  ? Breath  ? Shortness of Breath Yes;Limiting activity   ? ?  ?  ? ?  ? ? ?Exercise Target Goals: ?Exercise Program Goal: ?Individual exercise prescription set using results from initial 6 min walk test and THRR while considering  patient?s activity barriers and safety.  ? ?Exercise Prescription Goal: ?Initial exercise prescription builds to 30-45 minutes a day of aerobic activity, 2-3 days per week.  Home exercise guidelines will be given to patient during program as part of exercise prescription that the participant will acknowledge. ? ?Education: Aerobic Exercise: ?- Group verbal and visual presentation on the components of exercise prescription. Introduces F.I.T.T principle from ACSM for exercise prescriptions.  Reviews F.I.T.T. principles of aerobic exercise including progression. Written material given at graduation. ?Flowsheet Row Pulmonary Rehab from 12/04/2021 in [facility] Cardiac and Pulmonary Rehab  ?Education need identified 11/27/21  ? ?  ? ? ?Education: Resistance Exercise: ?- Group verbal and visual presentation on the components of exercise prescription. Introduces F.I.T.T principle from ACSM for exercise  prescriptions  Reviews F.I.T.T. principles of resistance exercise including progression. Written material given at graduation. ? ?  ?Education: Exercise & Equipment Safety: ?- Individual verbal instruction and demonstration of equipment use and safety with use of the equipment. ?Flowsheet Row Pulmonary Rehab from 11/17/2021 in [facility] Cardiac and Pulmonary Rehab  ?  Date 11/17/21  ?Educator [facility]  ?Instruction Review Code 1- Verbalizes Understanding  ? ?  ? ? ?Education: Exercise Physiology & General Exercise Guidelines: ?- Group verbal and written instruction with models to review the exercise physiology of the cardiovascular system and associated critical values. Provides general exercise guidelines with specific guidelines to those with heart or lung disease.  ? ? ?Education: Flexibility, Balance, Mind/Body Relaxation: ?- Group verbal and visual presentation with interactive activity on the components of exercise prescription. Introduces F.I.T.T principle from ACSM for exercise prescriptions. Reviews F.I.T.T. principles of flexibility and balance exercise training including progression. Also discusses the mind body connection.  Reviews various relaxation techniques to help reduce and manage stress (i.e. Deep breathing, progressive muscle relaxation, and visualization). Balance handout provided to take home. Written material given at graduation. ? ? ?Activity Barriers & Risk Stratification: ? Activity Barriers & Cardiac Risk Stratification - 11/27/21 1106   ? ?  ? Activity Barriers & Cardiac Risk Stratification  ? Activity Barriers Balance Concerns;Muscular Weakness;Deconditioning;History of Falls;Other (comment)   ? Comments Neuropathy bilateral legs   ? ?  ?  ? ?  ? ? ?6 Minute Walk: ? 6 Minute Walk   ? ? [place] Name 11/27/21 1118 04/09/22 1014  ?  ?  ? 6 Minute Walk  ? Phase Initial Discharge   ? Distance 1105 feet 1400 feet   ? Distance % Change -- 26.7 %   ? Distance Feet Change -- 295 ft   ? Walk Time 6 minutes 6  minutes   ? # of Rest Breaks 0 0   ? MPH 2.09 2.65   ? METS 2.13 2.76   ? RPE 11 12   ? Perceived Dyspnea  0 2   ? VO2 Peak 7.45 9.64   ? Symptoms No Yes (comment)   ? Comments -- SOB   ? Resting HR 65 bpm 79 bpm

## 2022-04-23 NOTE — Progress Notes (Signed)
Daily Session Note ? ?Patient Details  ?Name: DELPHA Carter ?MRN: [id number] ?Date of Birth: [birth year] ?Referring Provider:   ?Flowsheet Row Pulmonary Rehab from 11/27/2021 in [facility] Cardiac and Pulmonary Rehab  ?Referring Provider Serafina Royals MD  ? ?  ? ? ?Encounter Date: 04/23/2022 ? ?Check In: ? Session Check In - 04/23/22 1014   ? ?  ? Check-In  ? Supervising physician immediately available to respond to emergencies See telemetry face sheet for immediately available ER MD   ? Location [facility]-Cardiac & Pulmonary Rehab   ? Staff Present Birdie Sons, MPA, RN;Laureen Owens Shark, BS, RRT, CPFT;Melissa Caiola, RDN, LDN   ? Virtual Visit No   ? Medication changes reported     No   ? Fall or balance concerns reported    No   ? Warm-up and Cool-down Performed on first and last piece of equipment   ? Resistance Training Performed Yes   ? VAD Patient? No   ? PAD/SET Patient? No   ?  ? Pain Assessment  ? Currently in Pain? No/denies   ? ?  ?  ? ?  ? ? ? ? ? ?Social History  ? ?Tobacco Use  ?Smoking Status Former  ? Packs/day: 0.25  ? Years: 3.00  ? Pack years: 0.75  ? Types: Cigarettes  ? Quit date: 10/21/1965  ? Years since quitting: 56.5  ?Smokeless Tobacco Never  ? ? ?Goals Met:  ?Independence with exercise equipment ?Exercise tolerated well ?No report of concerns or symptoms today ?Strength training completed today ? ?Goals Unmet:  ?Not Applicable ? ?Comments:  Kelsey Carter graduated today from  rehab with 36 sessions completed.  Details of the patient's exercise prescription and what She needs to do in order to continue the prescription and progress were discussed with patient.  Patient was given a copy of prescription and goals.  Patient verbalized understanding.  Kelsey Carter plans to continue to exercise by walking at home. ? ? ? ?Dr. Emily Filbert is Medical Director for [place].  ?Dr. Ottie Glazier is Medical Director for [facility] Pulmonary Rehabilitation. ?

## 2022-04-23 NOTE — Progress Notes (Signed)
Discharge Progress Report ? ?Patient Details  ?Name: Kelsey Carter ?MRN: [id number] ?Date of Birth: October 05, 1946 ?Referring Provider:   ?Flowsheet Row Pulmonary Rehab from 11/27/2021 in [facility]  ?Referring Provider Serafina Royals MD  ? ?  ? ? ? ?Number of Visits: 50 ? ?Reason for Discharge:  ?Patient reached a stable level of exercise. ?Patient independent in their exercise. ?Patient has met program and personal goals. ? ?Smoking History:  ?Social History  ? ?Tobacco Use  ?Smoking Status Former  ? Packs/day: 0.25  ? Years: 3.00  ? Pack years: 0.75  ? Types: Cigarettes  ? Quit date: 10/21/1965  ? Years since quitting: 56.5  ?Smokeless Tobacco Never  ? ? ?Diagnosis:  ?Heart failure, diastolic, chronic ([place]) ? ?ADL [facility]: ? Pulmonary Assessment Scores   ? ? [place] Name 11/27/21 1112 04/09/22 1016 04/14/22 1034  ?  ? ADL [facility]  ? ADL Phase Entry Exit Exit  ? SOB Score total 14 -- 9  ? Rest 0 -- 0  ? Walk 4 -- 1  ? Stairs 4 -- 3  ? Bath 0 -- 0  ? Dress 0 -- 0  ? Shop 1 -- 0  ?  ? CAT Score  ? CAT Score 3 -- 8  ?  ? mMRC Score  ? mMRC Score 1 0 --  ? ?  ?  ? ?  ? ? ?Initial Exercise Prescription: ? Initial Exercise Prescription - 11/27/21 1100   ? ?  ? Date of Initial Exercise RX and Referring Provider  ? Date 11/27/21   ? Referring Provider Serafina Royals MD   ?  ? Oxygen  ? Maintain Oxygen Saturation 88% or higher   ?  ? Recumbant Bike  ? Level 1   ? RPM 60   ? Watts 15   ? Minutes 15   ? METs 2.1   ?  ? NuStep  ? Level 2   ? SPM 80   ? Minutes 15   ? METs 2.1   ?  ? REL-XR  ? Level 1   ? Speed 50   ? Minutes 15   ? METs 2.1   ?  ? Track  ? Laps 21   ? Minutes 15   ? METs 2.14   ?  ? Prescription Details  ? Frequency (times per week) 2   ? Duration Progress to 30 minutes of continuous aerobic without signs/symptoms of physical distress   ?  ? Intensity  ? THRR 40-80% of Max Heartrate 96-128   ? Ratings of Perceived Exertion 11-13   ? Perceived Dyspnea 0-4   ?  ? Progression  ? Progression Continue  to progress workloads to maintain intensity without signs/symptoms of physical distress.   ?  ? Resistance Training  ? Training Prescription Yes   ? Weight 3 lb   ? Reps 10-15   ? ?  ?  ? ?  ? ? ?Discharge Exercise Prescription (Final Exercise Prescription Changes): ? Exercise Prescription Changes - 04/20/22 1100   ? ?  ? Response to Exercise  ? Blood Pressure (Admit) 102/62   ? Blood Pressure (Exit) 108/64   ? Heart Rate (Admit) 83 bpm   ? Heart Rate (Exercise) 98 bpm   ? Heart Rate (Exit) 90 bpm   ? Oxygen Saturation (Admit) 98 %   ? Oxygen Saturation (Exercise) 97 %   ? Oxygen Saturation (Exit) 95 %   ? Rating of Perceived Exertion (  Exercise) 15   ? Perceived Dyspnea (Exercise) 1   ? Symptoms none   ? Duration Continue with 30 min of aerobic exercise without signs/symptoms of physical distress.   ? Intensity THRR unchanged   ?  ? Progression  ? Progression Continue to progress workloads to maintain intensity without signs/symptoms of physical distress.   ? Average METs 3.04   ?  ? Resistance Training  ? Training Prescription Yes   ? Weight 3 lb   ? Reps 10-15   ?  ? Interval Training  ? Interval Training No   ?  ? Recumbant Bike  ? Level 3   ? Watts 22   ? Minutes 15   ? METs 3.19   ?  ? Biostep-RELP  ? Level 1   ? Minutes 15   ? METs 1   ?  ? Track  ? Laps 30   ? Minutes 15   ? METs 2.63   ?  ? Home Exercise Plan  ? Plans to continue exercise at Home (comment)   walking  ? Frequency Add 2 additional days to program exercise sessions.   ? Initial Home Exercises Provided 12/23/21   ?  ? Oxygen  ? Maintain Oxygen Saturation 88% or higher   ? ?  ?  ? ?  ? ? ?Functional Capacity: ? 6 Minute Walk   ? ? [place] Name 11/27/21 1118 04/09/22 1014  ?  ?  ? 6 Minute Walk  ? Phase Initial Discharge   ? Distance 1105 feet 1400 feet   ? Distance % Change -- 26.7 %   ? Distance Feet Change -- 295 ft   ? Walk Time 6 minutes 6 minutes   ? # of Rest Breaks 0 0   ? MPH 2.09 2.65   ? METS 2.13 2.76   ? RPE 11 12   ? Perceived Dyspnea  0  2   ? VO2 Peak 7.45 9.64   ? Symptoms No Yes (comment)   ? Comments -- SOB   ? Resting HR 65 bpm 79 bpm   ? Resting BP 124/68 100/62   ? Resting Oxygen Saturation  97 % 96 %   ? Exercise Oxygen Saturation  during 6 min walk 95 % 93 %   ? Max Ex. HR 86 bpm 105 bpm   ? Max Ex. BP 132/70 124/56   ? 2 Minute Post BP 130/68 --   ?  ? Interval HR  ? 1 Minute HR 77 85   ? 2 Minute HR 82 94   ? 3 Minute HR 85 99   ? 4 Minute HR 86 103   ? 5 Minute HR 85 104   ? 6 Minute HR 83 105   ? 2 Minute Post HR 66 96   ? Interval Heart Rate? Yes Yes   ?  ? Interval Oxygen  ? Interval Oxygen? Yes Yes   ? Baseline Oxygen Saturation % 97 % 96 %   ? 1 Minute Oxygen Saturation % 97 % 93 %   ? 1 Minute Liters of Oxygen 0 L  RA 0 L  Room Air   ? 2 Minute Oxygen Saturation % 96 % 93 %   ? 2 Minute Liters of Oxygen 0 L 0 L   ? 3 Minute Oxygen Saturation % 95 % 97 %   ? 3 Minute Liters of Oxygen 0 L 0 L   ? 4 Minute Oxygen Saturation % 97 %  99 %   ? 4 Minute Liters of Oxygen 0 L 0 L   ? 5 Minute Oxygen Saturation % 97 % 97 %   ? 5 Minute Liters of Oxygen 0 L 0 L   ? 6 Minute Oxygen Saturation % 95 % 93 %   ? 6 Minute Liters of Oxygen 0 L 0 L   ? 2 Minute Post Oxygen Saturation % 98 % 97 %   ? 2 Minute Post Liters of Oxygen 0 L 0 L   ? ?  ?  ? ?  ? ? ?Psychological, QOL, Others - Outcomes: ?PHQ 2/9: ? ?  04/14/2022  ? 10:37 AM 12/02/2021  ?  8:51 AM 11/27/2021  ? 11:00 AM 01/29/2020  ?  4:25 PM 08/24/2019  ? 10:52 AM  ?Depression screen PHQ 2/9  ?Decreased Interest 0 0 0 0 0  ?Down, Depressed, Hopeless 0 0 0 0 0  ?PHQ - 2 Score 0 0 0 0 0  ?Altered sleeping 1  0 0 0  ?Tired, decreased energy 0  1 0 1  ?Change in appetite 0  0 0 0  ?Feeling bad or failure about yourself  0  0 0 0  ?Trouble concentrating 0  0 0 0  ?Moving slowly or fidgety/restless 0  0 0 0  ?Suicidal thoughts 0  0 0 0  ?PHQ-9 Score 1  1 0 1  ?Difficult doing work/chores Not difficult at all  Not difficult at all  Not difficult at all  ? ? ?Quality of Life: ? ? ? ?Nutrition & Weight -  Outcomes: ? Pre Biometrics - 11/27/21 1139   ? ?  ? Pre Biometrics  ? Height 5' 3.2" (1.605 m)   ? Weight 145 lb (65.8 kg)   ? BMI (Calculated) 25.53   ? Single Leg Stand 1.83 seconds   ? ?  ?  ? ?  ? ? Post Biometrics - 04/09/22 1016   ? ?  ?  Post  Biometrics  ? Height 5' 3.2" (1.605 m)   ? Weight 146 lb 4.8 oz (66.4 kg)   ? BMI (Calculated) 25.76   ? Single Leg Stand 2 seconds   ? ?  ?  ? ?  ? ? ?Nutrition: ? Nutrition Therapy & Goals - 12/30/21 0918   ? ?  ? Nutrition Therapy  ? Diet Heart healthy, low Na   ? Drug/Food Interactions Statins/Certain Fruits   ? Protein (specify units) 55-60g   ? Fiber 25 grams   ? Whole Grain Foods 3 servings   ? Saturated Fats 12 max. grams   ? Fruits and Vegetables 8 servings/day   ? Sodium 2 grams   ?  ? Personal Nutrition Goals  ? Nutrition Goal ST: add 1 good source of protein to breakfast or mid morning snack - high protein yogurt, cottage cheese, nuts/seeds, peanut butter, boiled egg LT: meet calorie and protein needs, maintain weight, maintian current heart healthy changes   ? Comments 76 y.o. F admitted to rehab for heart failure aslo presenting with CAD, HTN, HLD, afib, GERD. PMH includes breast cancer with chemo. Relevant medications include lipitor, vit D, lasix, magnesium, K+.  PYP 78. She tries to eat 2x/day instead of 3 and feels her diet is going well. Breakfast or lunch: Coffee with some cream. English muffin (jelly or apple butter) or cereal (raisin bran or cheerios 2% milk) or oatmeal, if going out to eat will get an egg  Dinner: chicken  with vegetables and sometimes mashed potatoes, salads. She will get steak with a salad and sweet potato when going out to eat. Goes out to eat 1x/week. She does not salt her food. She uses olive oil. Snacks: clementines Drinks: water. [place] reports eating less due to her lower appetite. She has lost 60 pounds over 4 years. She reports her weight has been steady. Reviewed heart healthy nutrition. Discussed the importance of  variety to meet nutritional needs and importance of calories and protein needs being met to help retain lean muscle tissue.   ?  ? Intervention Plan  ? Intervention Prescribe, educate and counsel regarding

## 2022-05-27 DIAGNOSIS — I10 Essential (primary) hypertension: Secondary | ICD-10-CM | POA: Diagnosis not present

## 2022-05-27 DIAGNOSIS — G620 Drug-induced polyneuropathy: Secondary | ICD-10-CM | POA: Diagnosis not present

## 2022-05-27 DIAGNOSIS — D649 Anemia, unspecified: Secondary | ICD-10-CM | POA: Diagnosis not present

## 2022-05-27 DIAGNOSIS — N1832 Chronic kidney disease, stage 3b: Secondary | ICD-10-CM | POA: Diagnosis not present

## 2022-05-27 DIAGNOSIS — I2510 Atherosclerotic heart disease of native coronary artery without angina pectoris: Secondary | ICD-10-CM | POA: Diagnosis not present

## 2022-05-27 DIAGNOSIS — I482 Chronic atrial fibrillation, unspecified: Secondary | ICD-10-CM | POA: Diagnosis not present

## 2022-05-27 DIAGNOSIS — T451X5A Adverse effect of antineoplastic and immunosuppressive drugs, initial encounter: Secondary | ICD-10-CM | POA: Diagnosis not present

## 2022-06-11 DIAGNOSIS — I10 Essential (primary) hypertension: Secondary | ICD-10-CM | POA: Diagnosis not present

## 2022-06-11 DIAGNOSIS — R8290 Unspecified abnormal findings in urine: Secondary | ICD-10-CM | POA: Diagnosis not present

## 2022-06-11 DIAGNOSIS — Z0000 Encounter for general adult medical examination without abnormal findings: Secondary | ICD-10-CM | POA: Diagnosis not present

## 2022-06-11 DIAGNOSIS — D649 Anemia, unspecified: Secondary | ICD-10-CM | POA: Diagnosis not present

## 2022-06-17 DIAGNOSIS — Z9013 Acquired absence of bilateral breasts and nipples: Secondary | ICD-10-CM | POA: Diagnosis not present

## 2022-06-17 DIAGNOSIS — R5381 Other malaise: Secondary | ICD-10-CM | POA: Diagnosis not present

## 2022-06-17 DIAGNOSIS — R5383 Other fatigue: Secondary | ICD-10-CM | POA: Diagnosis not present

## 2022-06-17 DIAGNOSIS — I480 Paroxysmal atrial fibrillation: Secondary | ICD-10-CM | POA: Diagnosis not present

## 2022-06-17 DIAGNOSIS — T451X5A Adverse effect of antineoplastic and immunosuppressive drugs, initial encounter: Secondary | ICD-10-CM | POA: Diagnosis not present

## 2022-06-17 DIAGNOSIS — G620 Drug-induced polyneuropathy: Secondary | ICD-10-CM | POA: Diagnosis not present

## 2022-06-17 DIAGNOSIS — D649 Anemia, unspecified: Secondary | ICD-10-CM | POA: Diagnosis not present

## 2022-06-17 DIAGNOSIS — C50012 Malignant neoplasm of nipple and areola, left female breast: Secondary | ICD-10-CM | POA: Diagnosis not present

## 2022-06-17 DIAGNOSIS — I2510 Atherosclerotic heart disease of native coronary artery without angina pectoris: Secondary | ICD-10-CM | POA: Diagnosis not present

## 2022-06-17 DIAGNOSIS — R0989 Other specified symptoms and signs involving the circulatory and respiratory systems: Secondary | ICD-10-CM | POA: Diagnosis not present

## 2022-06-17 DIAGNOSIS — C50011 Malignant neoplasm of nipple and areola, right female breast: Secondary | ICD-10-CM | POA: Diagnosis not present

## 2022-06-17 DIAGNOSIS — E039 Hypothyroidism, unspecified: Secondary | ICD-10-CM | POA: Diagnosis not present

## 2022-07-06 DIAGNOSIS — I2510 Atherosclerotic heart disease of native coronary artery without angina pectoris: Secondary | ICD-10-CM | POA: Diagnosis not present

## 2022-07-06 DIAGNOSIS — I10 Essential (primary) hypertension: Secondary | ICD-10-CM | POA: Diagnosis not present

## 2022-07-06 DIAGNOSIS — I480 Paroxysmal atrial fibrillation: Secondary | ICD-10-CM | POA: Diagnosis not present

## 2022-07-22 DIAGNOSIS — I771 Stricture of artery: Secondary | ICD-10-CM | POA: Diagnosis not present

## 2022-07-22 DIAGNOSIS — I6523 Occlusion and stenosis of bilateral carotid arteries: Secondary | ICD-10-CM | POA: Diagnosis not present

## 2022-07-22 DIAGNOSIS — R0989 Other specified symptoms and signs involving the circulatory and respiratory systems: Secondary | ICD-10-CM | POA: Diagnosis not present

## 2022-08-03 DIAGNOSIS — D101 Benign neoplasm of tongue: Secondary | ICD-10-CM | POA: Diagnosis not present

## 2022-08-03 DIAGNOSIS — K148 Other diseases of tongue: Secondary | ICD-10-CM | POA: Diagnosis not present

## 2022-08-03 DIAGNOSIS — L98491 Non-pressure chronic ulcer of skin of other sites limited to breakdown of skin: Secondary | ICD-10-CM | POA: Diagnosis not present

## 2022-08-06 LAB — SURGICAL PATHOLOGY

## 2022-08-27 DIAGNOSIS — K148 Other diseases of tongue: Secondary | ICD-10-CM | POA: Diagnosis not present

## 2022-08-27 DIAGNOSIS — I10 Essential (primary) hypertension: Secondary | ICD-10-CM | POA: Diagnosis not present

## 2022-08-27 DIAGNOSIS — Z9013 Acquired absence of bilateral breasts and nipples: Secondary | ICD-10-CM | POA: Diagnosis not present

## 2022-09-10 DIAGNOSIS — R5381 Other malaise: Secondary | ICD-10-CM | POA: Diagnosis not present

## 2022-09-10 DIAGNOSIS — R7309 Other abnormal glucose: Secondary | ICD-10-CM | POA: Diagnosis not present

## 2022-09-10 DIAGNOSIS — Z9013 Acquired absence of bilateral breasts and nipples: Secondary | ICD-10-CM | POA: Diagnosis not present

## 2022-09-10 DIAGNOSIS — E039 Hypothyroidism, unspecified: Secondary | ICD-10-CM | POA: Diagnosis not present

## 2022-09-10 DIAGNOSIS — C50012 Malignant neoplasm of nipple and areola, left female breast: Secondary | ICD-10-CM | POA: Diagnosis not present

## 2022-09-10 DIAGNOSIS — I480 Paroxysmal atrial fibrillation: Secondary | ICD-10-CM | POA: Diagnosis not present

## 2022-09-10 DIAGNOSIS — R5383 Other fatigue: Secondary | ICD-10-CM | POA: Diagnosis not present

## 2022-09-10 DIAGNOSIS — D649 Anemia, unspecified: Secondary | ICD-10-CM | POA: Diagnosis not present

## 2022-09-10 DIAGNOSIS — R0989 Other specified symptoms and signs involving the circulatory and respiratory systems: Secondary | ICD-10-CM | POA: Diagnosis not present

## 2022-09-10 DIAGNOSIS — C50011 Malignant neoplasm of nipple and areola, right female breast: Secondary | ICD-10-CM | POA: Diagnosis not present

## 2022-09-10 DIAGNOSIS — Z79899 Other long term (current) drug therapy: Secondary | ICD-10-CM | POA: Diagnosis not present

## 2022-09-10 DIAGNOSIS — I2510 Atherosclerotic heart disease of native coronary artery without angina pectoris: Secondary | ICD-10-CM | POA: Diagnosis not present

## 2022-09-10 DIAGNOSIS — G620 Drug-induced polyneuropathy: Secondary | ICD-10-CM | POA: Diagnosis not present

## 2022-09-10 DIAGNOSIS — T451X5A Adverse effect of antineoplastic and immunosuppressive drugs, initial encounter: Secondary | ICD-10-CM | POA: Diagnosis not present

## 2022-09-11 DIAGNOSIS — T451X5A Adverse effect of antineoplastic and immunosuppressive drugs, initial encounter: Secondary | ICD-10-CM | POA: Diagnosis not present

## 2022-09-11 DIAGNOSIS — R5383 Other fatigue: Secondary | ICD-10-CM | POA: Diagnosis not present

## 2022-09-11 DIAGNOSIS — R0989 Other specified symptoms and signs involving the circulatory and respiratory systems: Secondary | ICD-10-CM | POA: Diagnosis not present

## 2022-09-11 DIAGNOSIS — I480 Paroxysmal atrial fibrillation: Secondary | ICD-10-CM | POA: Diagnosis not present

## 2022-09-11 DIAGNOSIS — C50011 Malignant neoplasm of nipple and areola, right female breast: Secondary | ICD-10-CM | POA: Diagnosis not present

## 2022-09-11 DIAGNOSIS — I2510 Atherosclerotic heart disease of native coronary artery without angina pectoris: Secondary | ICD-10-CM | POA: Diagnosis not present

## 2022-09-11 DIAGNOSIS — G620 Drug-induced polyneuropathy: Secondary | ICD-10-CM | POA: Diagnosis not present

## 2022-09-11 DIAGNOSIS — D649 Anemia, unspecified: Secondary | ICD-10-CM | POA: Diagnosis not present

## 2022-09-11 DIAGNOSIS — R5381 Other malaise: Secondary | ICD-10-CM | POA: Diagnosis not present

## 2022-09-11 DIAGNOSIS — E039 Hypothyroidism, unspecified: Secondary | ICD-10-CM | POA: Diagnosis not present

## 2022-09-11 DIAGNOSIS — Z9013 Acquired absence of bilateral breasts and nipples: Secondary | ICD-10-CM | POA: Diagnosis not present

## 2022-09-11 DIAGNOSIS — C50012 Malignant neoplasm of nipple and areola, left female breast: Secondary | ICD-10-CM | POA: Diagnosis not present

## 2022-09-17 DIAGNOSIS — Z171 Estrogen receptor negative status [ER-]: Secondary | ICD-10-CM | POA: Diagnosis not present

## 2022-09-17 DIAGNOSIS — K148 Other diseases of tongue: Secondary | ICD-10-CM | POA: Diagnosis not present

## 2022-09-17 DIAGNOSIS — Z9013 Acquired absence of bilateral breasts and nipples: Secondary | ICD-10-CM | POA: Diagnosis not present

## 2022-09-17 DIAGNOSIS — T451X5A Adverse effect of antineoplastic and immunosuppressive drugs, initial encounter: Secondary | ICD-10-CM | POA: Diagnosis not present

## 2022-09-17 DIAGNOSIS — I480 Paroxysmal atrial fibrillation: Secondary | ICD-10-CM | POA: Diagnosis not present

## 2022-09-17 DIAGNOSIS — I10 Essential (primary) hypertension: Secondary | ICD-10-CM | POA: Diagnosis not present

## 2022-09-17 DIAGNOSIS — R634 Abnormal weight loss: Secondary | ICD-10-CM | POA: Diagnosis not present

## 2022-09-17 DIAGNOSIS — G620 Drug-induced polyneuropathy: Secondary | ICD-10-CM | POA: Diagnosis not present

## 2022-09-17 DIAGNOSIS — R7309 Other abnormal glucose: Secondary | ICD-10-CM | POA: Diagnosis not present

## 2022-09-17 DIAGNOSIS — E782 Mixed hyperlipidemia: Secondary | ICD-10-CM | POA: Diagnosis not present

## 2022-09-17 DIAGNOSIS — C50911 Malignant neoplasm of unspecified site of right female breast: Secondary | ICD-10-CM | POA: Diagnosis not present

## 2022-09-17 DIAGNOSIS — Z9889 Other specified postprocedural states: Secondary | ICD-10-CM | POA: Diagnosis not present

## 2022-09-27 DIAGNOSIS — R058 Other specified cough: Secondary | ICD-10-CM | POA: Diagnosis not present

## 2022-09-28 DIAGNOSIS — K1370 Unspecified lesions of oral mucosa: Principal | ICD-10-CM

## 2022-10-07 DIAGNOSIS — J209 Acute bronchitis, unspecified: Secondary | ICD-10-CM | POA: Diagnosis not present

## 2022-10-07 DIAGNOSIS — J019 Acute sinusitis, unspecified: Secondary | ICD-10-CM | POA: Diagnosis not present

## 2022-10-07 DIAGNOSIS — B9689 Other specified bacterial agents as the cause of diseases classified elsewhere: Secondary | ICD-10-CM | POA: Diagnosis not present

## 2022-10-21 ENCOUNTER — Ambulatory Visit: Admit: 2022-10-21 | Discharge: 2022-10-22 | Payer: PRIVATE HEALTH INSURANCE

## 2022-10-21 DIAGNOSIS — K1370 Unspecified lesions of oral mucosa: Principal | ICD-10-CM

## 2022-10-21 DIAGNOSIS — K148 Other diseases of tongue: Principal | ICD-10-CM

## 2022-10-21 DIAGNOSIS — E7800 Pure hypercholesterolemia, unspecified: Secondary | ICD-10-CM | POA: Diagnosis not present

## 2022-10-22 ENCOUNTER — Ambulatory Visit: Admit: 2022-10-22 | Discharge: 2022-10-23 | Payer: PRIVATE HEALTH INSURANCE

## 2022-10-22 DIAGNOSIS — K148 Other diseases of tongue: Principal | ICD-10-CM

## 2022-10-22 DIAGNOSIS — K1379 Other lesions of oral mucosa: Principal | ICD-10-CM

## 2022-10-26 ENCOUNTER — Encounter: Admit: 2022-10-26 | Discharge: 2022-10-26 | Payer: PRIVATE HEALTH INSURANCE

## 2022-10-26 DIAGNOSIS — K148 Other diseases of tongue: Principal | ICD-10-CM

## 2022-10-30 ENCOUNTER — Ambulatory Visit: Admit: 2022-10-30 | Discharge: 2022-10-30 | Payer: PRIVATE HEALTH INSURANCE

## 2022-10-30 DIAGNOSIS — D649 Anemia, unspecified: Principal | ICD-10-CM

## 2022-10-30 DIAGNOSIS — I4891 Unspecified atrial fibrillation: Principal | ICD-10-CM

## 2022-10-30 DIAGNOSIS — E785 Hyperlipidemia, unspecified: Principal | ICD-10-CM

## 2022-10-30 DIAGNOSIS — N1831 Stage 3a chronic kidney disease (CMS-HCC): Principal | ICD-10-CM

## 2022-10-30 DIAGNOSIS — I5020 Unspecified systolic (congestive) heart failure: Principal | ICD-10-CM

## 2022-10-30 DIAGNOSIS — E039 Hypothyroidism, unspecified: Principal | ICD-10-CM

## 2022-10-30 DIAGNOSIS — Z9889 Other specified postprocedural states: Principal | ICD-10-CM

## 2022-10-30 DIAGNOSIS — K148 Other diseases of tongue: Principal | ICD-10-CM

## 2022-10-30 DIAGNOSIS — R112 Nausea with vomiting, unspecified: Principal | ICD-10-CM

## 2022-10-30 DIAGNOSIS — I10 Essential (primary) hypertension: Principal | ICD-10-CM

## 2022-10-30 DIAGNOSIS — I2510 Atherosclerotic heart disease of native coronary artery without angina pectoris: Principal | ICD-10-CM

## 2022-10-30 DIAGNOSIS — I447 Left bundle-branch block, unspecified: Secondary | ICD-10-CM | POA: Diagnosis not present

## 2022-10-30 DIAGNOSIS — I440 Atrioventricular block, first degree: Secondary | ICD-10-CM | POA: Diagnosis not present

## 2022-10-30 DIAGNOSIS — R9431 Abnormal electrocardiogram [ECG] [EKG]: Secondary | ICD-10-CM | POA: Diagnosis not present

## 2022-10-31 DIAGNOSIS — I4891 Unspecified atrial fibrillation: Secondary | ICD-10-CM | POA: Diagnosis not present

## 2022-11-03 ENCOUNTER — Encounter
Admit: 2022-11-03 | Discharge: 2022-11-04 | Disposition: A | Discharge status: Home / Self Care | Payer: PRIVATE HEALTH INSURANCE | Attending: Student in an Organized Health Care Education/Training Program

## 2022-11-03 ENCOUNTER — Encounter
Admit: 2022-11-03 | Discharge: 2022-11-04 | Disposition: A | Discharge status: Home / Self Care | Payer: PRIVATE HEALTH INSURANCE | Attending: Anesthesiology

## 2022-11-03 ENCOUNTER — Ambulatory Visit
Admit: 2022-11-03 | Discharge: 2022-11-04 | Disposition: A | Discharge status: Home / Self Care | Payer: PRIVATE HEALTH INSURANCE

## 2022-11-03 DIAGNOSIS — C069 Malignant neoplasm of mouth, unspecified: Secondary | ICD-10-CM | POA: Diagnosis not present

## 2022-11-03 DIAGNOSIS — K148 Other diseases of tongue: Secondary | ICD-10-CM | POA: Diagnosis not present

## 2022-11-03 DIAGNOSIS — K1370 Unspecified lesions of oral mucosa: Secondary | ICD-10-CM | POA: Diagnosis not present

## 2022-11-03 DIAGNOSIS — I480 Paroxysmal atrial fibrillation: Secondary | ICD-10-CM | POA: Diagnosis not present

## 2022-11-03 DIAGNOSIS — B029 Zoster without complications: Secondary | ICD-10-CM | POA: Diagnosis not present

## 2022-11-03 DIAGNOSIS — I129 Hypertensive chronic kidney disease with stage 1 through stage 4 chronic kidney disease, or unspecified chronic kidney disease: Secondary | ICD-10-CM | POA: Diagnosis not present

## 2022-11-03 DIAGNOSIS — E039 Hypothyroidism, unspecified: Secondary | ICD-10-CM | POA: Diagnosis not present

## 2022-11-03 DIAGNOSIS — Z955 Presence of coronary angioplasty implant and graft: Secondary | ICD-10-CM | POA: Diagnosis not present

## 2022-11-03 DIAGNOSIS — I2510 Atherosclerotic heart disease of native coronary artery without angina pectoris: Secondary | ICD-10-CM | POA: Diagnosis not present

## 2022-11-03 DIAGNOSIS — Z923 Personal history of irradiation: Secondary | ICD-10-CM | POA: Diagnosis not present

## 2022-11-03 DIAGNOSIS — D631 Anemia in chronic kidney disease: Secondary | ICD-10-CM | POA: Diagnosis not present

## 2022-11-03 DIAGNOSIS — E785 Hyperlipidemia, unspecified: Secondary | ICD-10-CM | POA: Diagnosis not present

## 2022-11-03 DIAGNOSIS — Z9013 Acquired absence of bilateral breasts and nipples: Secondary | ICD-10-CM | POA: Diagnosis not present

## 2022-11-03 DIAGNOSIS — Z8739 Personal history of other diseases of the musculoskeletal system and connective tissue: Secondary | ICD-10-CM | POA: Diagnosis not present

## 2022-11-03 DIAGNOSIS — I495 Sick sinus syndrome: Secondary | ICD-10-CM | POA: Diagnosis not present

## 2022-11-03 DIAGNOSIS — Z9221 Personal history of antineoplastic chemotherapy: Secondary | ICD-10-CM | POA: Diagnosis not present

## 2022-11-03 DIAGNOSIS — Z8679 Personal history of other diseases of the circulatory system: Secondary | ICD-10-CM | POA: Diagnosis not present

## 2022-11-03 DIAGNOSIS — Z853 Personal history of malignant neoplasm of breast: Secondary | ICD-10-CM | POA: Diagnosis not present

## 2022-11-03 DIAGNOSIS — Z7901 Long term (current) use of anticoagulants: Secondary | ICD-10-CM | POA: Diagnosis not present

## 2022-11-03 DIAGNOSIS — N1832 Chronic kidney disease, stage 3b: Secondary | ICD-10-CM | POA: Diagnosis not present

## 2022-11-03 DIAGNOSIS — L859 Epidermal thickening, unspecified: Secondary | ICD-10-CM | POA: Diagnosis not present

## 2022-11-03 DIAGNOSIS — C021 Malignant neoplasm of border of tongue: Secondary | ICD-10-CM | POA: Diagnosis not present

## 2022-11-04 MED ORDER — SENNOSIDES 8.6 MG-DOCUSATE SODIUM 50 MG TABLET
ORAL_TABLET | Freq: Every day | ORAL | 1 refills | 15.00 days | Status: CP | PRN
Start: 2022-11-04 — End: 2022-12-04

## 2022-11-04 MED ORDER — CHLORHEXIDINE GLUCONATE 0.12 % MOUTHWASH
Freq: Three times a day (TID) | OROMUCOSAL | 0 refills | 14.00 days | Status: CP
Start: 2022-11-04 — End: 2022-11-18

## 2022-11-04 MED ORDER — AMOXICILLIN 875 MG-POTASSIUM CLAVULANATE 125 MG TABLET
ORAL_TABLET | Freq: Two times a day (BID) | ORAL | 0 refills | 10.00 days | Status: CP
Start: 2022-11-04 — End: 2022-11-14

## 2022-11-09 ENCOUNTER — Ambulatory Visit: Admit: 2022-11-09 | Discharge: 2022-11-10 | Payer: PRIVATE HEALTH INSURANCE

## 2022-11-09 DIAGNOSIS — Z4889 Encounter for other specified surgical aftercare: Principal | ICD-10-CM

## 2022-11-09 DIAGNOSIS — G8918 Other acute postprocedural pain: Principal | ICD-10-CM

## 2022-11-09 MED ORDER — TRAMADOL 50 MG TABLET
ORAL_TABLET | Freq: Four times a day (QID) | ORAL | 0 refills | 3 days | Status: CP | PRN
Start: 2022-11-09 — End: ?

## 2022-11-16 ENCOUNTER — Ambulatory Visit
Admit: 2022-11-16 | Discharge: 2022-11-16 | Disposition: A | Discharge status: Home / Self Care | Payer: PRIVATE HEALTH INSURANCE

## 2022-11-16 ENCOUNTER — Emergency Department
Admit: 2022-11-16 | Discharge: 2022-11-16 | Disposition: A | Discharge status: Home / Self Care | Payer: PRIVATE HEALTH INSURANCE

## 2022-11-16 DIAGNOSIS — K148 Other diseases of tongue: Principal | ICD-10-CM

## 2022-11-16 DIAGNOSIS — E785 Hyperlipidemia, unspecified: Secondary | ICD-10-CM | POA: Diagnosis not present

## 2022-11-16 DIAGNOSIS — Z881 Allergy status to other antibiotic agents status: Secondary | ICD-10-CM | POA: Diagnosis not present

## 2022-11-16 DIAGNOSIS — I10 Essential (primary) hypertension: Secondary | ICD-10-CM | POA: Diagnosis not present

## 2022-11-16 DIAGNOSIS — Z888 Allergy status to other drugs, medicaments and biological substances status: Secondary | ICD-10-CM | POA: Diagnosis not present

## 2022-11-16 DIAGNOSIS — K91840 Postprocedural hemorrhage and hematoma of a digestive system organ or structure following a digestive system procedure: Secondary | ICD-10-CM | POA: Diagnosis not present

## 2022-11-16 DIAGNOSIS — Z955 Presence of coronary angioplasty implant and graft: Secondary | ICD-10-CM | POA: Diagnosis not present

## 2022-11-16 DIAGNOSIS — E7800 Pure hypercholesterolemia, unspecified: Secondary | ICD-10-CM | POA: Diagnosis not present

## 2022-11-16 DIAGNOSIS — Z7901 Long term (current) use of anticoagulants: Secondary | ICD-10-CM | POA: Diagnosis not present

## 2022-11-16 DIAGNOSIS — I4891 Unspecified atrial fibrillation: Secondary | ICD-10-CM | POA: Diagnosis not present

## 2022-11-16 DIAGNOSIS — D539 Nutritional anemia, unspecified: Secondary | ICD-10-CM | POA: Diagnosis not present

## 2022-11-16 DIAGNOSIS — Z9049 Acquired absence of other specified parts of digestive tract: Secondary | ICD-10-CM | POA: Diagnosis not present

## 2022-11-19 ENCOUNTER — Ambulatory Visit: Admit: 2022-11-19 | Discharge: 2022-11-20 | Payer: PRIVATE HEALTH INSURANCE

## 2022-11-19 DIAGNOSIS — C029 Malignant neoplasm of tongue, unspecified: Principal | ICD-10-CM

## 2022-11-19 DIAGNOSIS — K149 Disease of tongue, unspecified: Principal | ICD-10-CM

## 2022-11-19 DIAGNOSIS — K148 Other diseases of tongue: Principal | ICD-10-CM

## 2022-11-19 DIAGNOSIS — Z09 Encounter for follow-up examination after completed treatment for conditions other than malignant neoplasm: Secondary | ICD-10-CM | POA: Diagnosis not present

## 2023-01-07 ENCOUNTER — Emergency Department: Payer: PPO

## 2023-01-07 ENCOUNTER — Inpatient Hospital Stay
Admission: EM | Admit: 2023-01-07 | Discharge: 2023-01-11 | DRG: 482 | Disposition: A | Discharge status: Home Health | Payer: PPO | Attending: Student | Admitting: Student

## 2023-01-07 ENCOUNTER — Other Ambulatory Visit: Payer: Self-pay

## 2023-01-07 ENCOUNTER — Encounter: Payer: Self-pay | Admitting: Internal Medicine

## 2023-01-07 DIAGNOSIS — Z79899 Other long term (current) drug therapy: Secondary | ICD-10-CM | POA: Diagnosis not present

## 2023-01-07 DIAGNOSIS — Z7901 Long term (current) use of anticoagulants: Secondary | ICD-10-CM

## 2023-01-07 DIAGNOSIS — Z9221 Personal history of antineoplastic chemotherapy: Secondary | ICD-10-CM | POA: Diagnosis not present

## 2023-01-07 DIAGNOSIS — Z1152 Encounter for screening for COVID-19: Secondary | ICD-10-CM | POA: Diagnosis not present

## 2023-01-07 DIAGNOSIS — R7303 Prediabetes: Secondary | ICD-10-CM | POA: Diagnosis present

## 2023-01-07 DIAGNOSIS — S72001A Fracture of unspecified part of neck of right femur, initial encounter for closed fracture: Secondary | ICD-10-CM | POA: Diagnosis not present

## 2023-01-07 DIAGNOSIS — I10 Essential (primary) hypertension: Secondary | ICD-10-CM | POA: Diagnosis not present

## 2023-01-07 DIAGNOSIS — Z79890 Hormone replacement therapy (postmenopausal): Secondary | ICD-10-CM

## 2023-01-07 DIAGNOSIS — M2578 Osteophyte, vertebrae: Secondary | ICD-10-CM | POA: Diagnosis not present

## 2023-01-07 DIAGNOSIS — Z841 Family history of disorders of kidney and ureter: Secondary | ICD-10-CM | POA: Diagnosis not present

## 2023-01-07 DIAGNOSIS — D638 Anemia in other chronic diseases classified elsewhere: Secondary | ICD-10-CM | POA: Diagnosis not present

## 2023-01-07 DIAGNOSIS — Z853 Personal history of malignant neoplasm of breast: Secondary | ICD-10-CM | POA: Diagnosis not present

## 2023-01-07 DIAGNOSIS — Z8049 Family history of malignant neoplasm of other genital organs: Secondary | ICD-10-CM

## 2023-01-07 DIAGNOSIS — K219 Gastro-esophageal reflux disease without esophagitis: Secondary | ICD-10-CM | POA: Diagnosis present

## 2023-01-07 DIAGNOSIS — W1830XA Fall on same level, unspecified, initial encounter: Secondary | ICD-10-CM | POA: Diagnosis present

## 2023-01-07 DIAGNOSIS — Z803 Family history of malignant neoplasm of breast: Secondary | ICD-10-CM | POA: Diagnosis not present

## 2023-01-07 DIAGNOSIS — W19XXXA Unspecified fall, initial encounter: Secondary | ICD-10-CM

## 2023-01-07 DIAGNOSIS — M25572 Pain in left ankle and joints of left foot: Secondary | ICD-10-CM | POA: Diagnosis not present

## 2023-01-07 DIAGNOSIS — Y92009 Unspecified place in unspecified non-institutional (private) residence as the place of occurrence of the external cause: Secondary | ICD-10-CM | POA: Diagnosis not present

## 2023-01-07 DIAGNOSIS — E039 Hypothyroidism, unspecified: Secondary | ICD-10-CM | POA: Diagnosis present

## 2023-01-07 DIAGNOSIS — M47812 Spondylosis without myelopathy or radiculopathy, cervical region: Secondary | ICD-10-CM | POA: Diagnosis not present

## 2023-01-07 DIAGNOSIS — Z9882 Breast implant status: Secondary | ICD-10-CM

## 2023-01-07 DIAGNOSIS — I480 Paroxysmal atrial fibrillation: Secondary | ICD-10-CM | POA: Diagnosis present

## 2023-01-07 DIAGNOSIS — E785 Hyperlipidemia, unspecified: Secondary | ICD-10-CM | POA: Diagnosis present

## 2023-01-07 DIAGNOSIS — G629 Polyneuropathy, unspecified: Secondary | ICD-10-CM | POA: Diagnosis present

## 2023-01-07 DIAGNOSIS — Z955 Presence of coronary angioplasty implant and graft: Secondary | ICD-10-CM | POA: Diagnosis not present

## 2023-01-07 DIAGNOSIS — R531 Weakness: Secondary | ICD-10-CM | POA: Diagnosis not present

## 2023-01-07 DIAGNOSIS — M4312 Spondylolisthesis, cervical region: Secondary | ICD-10-CM | POA: Diagnosis not present

## 2023-01-07 DIAGNOSIS — Z87891 Personal history of nicotine dependence: Secondary | ICD-10-CM | POA: Diagnosis not present

## 2023-01-07 DIAGNOSIS — Z888 Allergy status to other drugs, medicaments and biological substances status: Secondary | ICD-10-CM

## 2023-01-07 DIAGNOSIS — M25551 Pain in right hip: Secondary | ICD-10-CM | POA: Diagnosis not present

## 2023-01-07 DIAGNOSIS — S72121A Displaced fracture of lesser trochanter of right femur, initial encounter for closed fracture: Secondary | ICD-10-CM | POA: Diagnosis not present

## 2023-01-07 DIAGNOSIS — I959 Hypotension, unspecified: Secondary | ICD-10-CM | POA: Diagnosis not present

## 2023-01-07 DIAGNOSIS — I447 Left bundle-branch block, unspecified: Secondary | ICD-10-CM | POA: Diagnosis present

## 2023-01-07 DIAGNOSIS — S0990XA Unspecified injury of head, initial encounter: Secondary | ICD-10-CM | POA: Diagnosis not present

## 2023-01-07 DIAGNOSIS — D649 Anemia, unspecified: Secondary | ICD-10-CM | POA: Diagnosis not present

## 2023-01-07 DIAGNOSIS — Z885 Allergy status to narcotic agent status: Secondary | ICD-10-CM

## 2023-01-07 DIAGNOSIS — Z8249 Family history of ischemic heart disease and other diseases of the circulatory system: Secondary | ICD-10-CM | POA: Diagnosis not present

## 2023-01-07 DIAGNOSIS — S72141A Displaced intertrochanteric fracture of right femur, initial encounter for closed fracture: Principal | ICD-10-CM | POA: Diagnosis present

## 2023-01-07 DIAGNOSIS — Z923 Personal history of irradiation: Secondary | ICD-10-CM

## 2023-01-07 DIAGNOSIS — Z9013 Acquired absence of bilateral breasts and nipples: Secondary | ICD-10-CM

## 2023-01-07 DIAGNOSIS — Z7983 Long term (current) use of bisphosphonates: Secondary | ICD-10-CM

## 2023-01-07 DIAGNOSIS — S13140A Subluxation of C3/C4 cervical vertebrae, initial encounter: Secondary | ICD-10-CM | POA: Diagnosis not present

## 2023-01-07 DIAGNOSIS — I2510 Atherosclerotic heart disease of native coronary artery without angina pectoris: Secondary | ICD-10-CM | POA: Diagnosis present

## 2023-01-07 DIAGNOSIS — R079 Chest pain, unspecified: Secondary | ICD-10-CM | POA: Diagnosis not present

## 2023-01-07 DIAGNOSIS — F419 Anxiety disorder, unspecified: Secondary | ICD-10-CM | POA: Diagnosis present

## 2023-01-07 DIAGNOSIS — S728X1A Other fracture of right femur, initial encounter for closed fracture: Principal | ICD-10-CM

## 2023-01-07 DIAGNOSIS — Z886 Allergy status to analgesic agent status: Secondary | ICD-10-CM

## 2023-01-07 DIAGNOSIS — I6523 Occlusion and stenosis of bilateral carotid arteries: Secondary | ICD-10-CM | POA: Diagnosis not present

## 2023-01-07 LAB — BASIC METABOLIC PANEL
Anion gap: 11 (ref 5–15)
BUN: 17 mg/dL (ref 8–23)
CO2: 25 mmol/L (ref 22–32)
Calcium: 9.1 mg/dL (ref 8.9–10.3)
Chloride: 100 mmol/L (ref 98–111)
Creatinine, Ser: 1.08 mg/dL — ABNORMAL HIGH (ref 0.44–1.00)
GFR, Estimated: 53 mL/min — ABNORMAL LOW (ref >60)
Glucose, Bld: 161 mg/dL — ABNORMAL HIGH (ref 70–99)
Potassium: 3.3 mmol/L — ABNORMAL LOW (ref 3.5–5.1)
Sodium: 136 mmol/L (ref 135–145)

## 2023-01-07 LAB — CBC WITH DIFFERENTIAL/PLATELET
Abs Immature Granulocytes: 0.05 10*3/uL (ref 0.00–0.07)
Basophils Absolute: 0.1 10*3/uL (ref 0.0–0.1)
Basophils Relative: 2 %
Eosinophils Absolute: 0.3 10*3/uL (ref 0.0–0.5)
Eosinophils Relative: 5 %
HCT: 32 % — ABNORMAL LOW (ref 36.0–46.0)
Hemoglobin: 10.6 g/dL — ABNORMAL LOW (ref 12.0–15.0)
Immature Granulocytes: 1 %
Lymphocytes Relative: 42 %
Lymphs Abs: 2.5 10*3/uL (ref 0.7–4.0)
MCH: 33.1 pg (ref 26.0–34.0)
MCHC: 33.1 g/dL (ref 30.0–36.0)
MCV: 100 fL (ref 80.0–100.0)
Monocytes Absolute: 0.6 10*3/uL (ref 0.1–1.0)
Monocytes Relative: 11 %
Neutro Abs: 2.3 10*3/uL (ref 1.7–7.7)
Neutrophils Relative %: 39 %
Platelets: 308 10*3/uL (ref 150–400)
RBC: 3.2 MIL/uL — ABNORMAL LOW (ref 3.87–5.11)
RDW: 14.1 % (ref 11.5–15.5)
WBC: 5.8 10*3/uL (ref 4.0–10.5)
nRBC: 0 % (ref 0.0–0.2)

## 2023-01-07 LAB — SARS CORONAVIRUS 2 BY RT PCR: SARS Coronavirus 2 by RT PCR: NEGATIVE

## 2023-01-07 LAB — PROTIME-INR
INR: 1.7 — ABNORMAL HIGH (ref 0.8–1.2)
Prothrombin Time: 19.5 seconds — ABNORMAL HIGH (ref 11.4–15.2)

## 2023-01-07 MED ORDER — SODIUM CHLORIDE 0.9 % IV BOLUS
500.0000 mL | Freq: Once | INTRAVENOUS | Status: AC
  Administered 2023-01-07: 500 mL via INTRAVENOUS

## 2023-01-07 MED ORDER — FENTANYL CITRATE PF 50 MCG/ML IJ SOSY
25.0000 ug | PREFILLED_SYRINGE | Freq: Once | INTRAMUSCULAR | Status: AC
  Administered 2023-01-07: 25 ug via INTRAVENOUS
  Filled 2023-01-07: qty 1

## 2023-01-07 MED ORDER — LACTATED RINGERS IV SOLN: INTRAVENOUS | Status: DC

## 2023-01-07 MED ORDER — ACETAMINOPHEN 500 MG PO TABS: 1000.0000 mg | ORAL_TABLET | Freq: Four times a day (QID) | ORAL | Status: DC | PRN

## 2023-01-07 MED ORDER — ONDANSETRON HCL 4 MG/2ML IJ SOLN
4.0000 mg | Freq: Once | INTRAMUSCULAR | Status: AC
  Administered 2023-01-07: 4 mg via INTRAVENOUS
  Filled 2023-01-07: qty 2

## 2023-01-07 MED ORDER — TRANEXAMIC ACID-NACL 1000-0.7 MG/100ML-% IV SOLN
1000.0000 mg | INTRAVENOUS | Status: AC
  Administered 2023-01-08: 1000 mg via INTRAVENOUS
  Filled 2023-01-07: qty 100

## 2023-01-07 MED ORDER — BISACODYL 5 MG PO TBEC: 5.0000 mg | DELAYED_RELEASE_TABLET | Freq: Every day | ORAL | Status: DC | PRN

## 2023-01-07 MED ORDER — CEFAZOLIN SODIUM-DEXTROSE 2-4 GM/100ML-% IV SOLN
2.0000 g | INTRAVENOUS | Status: AC
  Administered 2023-01-08: 2 g via INTRAVENOUS

## 2023-01-07 MED ORDER — LEVOTHYROXINE SODIUM 50 MCG PO TABS
50.0000 ug | ORAL_TABLET | Freq: Every morning | ORAL | Status: DC
  Administered 2023-01-09 – 2023-01-11 (×3): 50 ug via ORAL
  Filled 2023-01-07 (×3): qty 1

## 2023-01-07 MED ORDER — ATORVASTATIN CALCIUM 10 MG PO TABS
10.0000 mg | ORAL_TABLET | Freq: Every day | ORAL | Status: DC
  Administered 2023-01-08 – 2023-01-11 (×4): 10 mg via ORAL
  Filled 2023-01-07 (×4): qty 1

## 2023-01-07 MED ORDER — FENTANYL CITRATE PF 50 MCG/ML IJ SOSY
12.5000 ug | PREFILLED_SYRINGE | INTRAMUSCULAR | Status: DC | PRN
  Administered 2023-01-07: 12.5 ug via INTRAVENOUS
  Filled 2023-01-07: qty 1

## 2023-01-07 MED ORDER — LACTATED RINGERS IV SOLN: INTRAVENOUS | Status: AC

## 2023-01-07 MED ORDER — POTASSIUM CHLORIDE CRYS ER 20 MEQ PO TBCR
40.0000 meq | EXTENDED_RELEASE_TABLET | Freq: Two times a day (BID) | ORAL | Status: DC
  Administered 2023-01-08: 40 meq via ORAL
  Filled 2023-01-07: qty 2

## 2023-01-07 MED ORDER — DOCUSATE SODIUM 100 MG PO CAPS: 100.0000 mg | ORAL_CAPSULE | Freq: Two times a day (BID) | ORAL | Status: DC

## 2023-01-07 MED ORDER — METHOCARBAMOL 1000 MG/10ML IJ SOLN: 500.0000 mg | Freq: Four times a day (QID) | INTRAVENOUS | Status: DC | PRN

## 2023-01-07 MED ORDER — POLYETHYLENE GLYCOL 3350 17 G PO PACK: 17.0000 g | PACK | Freq: Every day | ORAL | Status: DC | PRN

## 2023-01-07 MED ORDER — METHOCARBAMOL 500 MG PO TABS: 500.0000 mg | ORAL_TABLET | Freq: Four times a day (QID) | ORAL | Status: DC | PRN

## 2023-01-07 NOTE — H&P (Signed)
History and Physical    Chief Complaint: Fall /  Right Hip Pain    HISTORY OF PRESENT ILLNESS: Kelsey Carter is an 77 y.o. female seen in the emergency room for fall and right hip pain patient states that she missed a step did not have any loss of consciousness. Patient Eliquis for history of atrial fibrillation patient also has a history of cardiomyopathy from chemotherapy, hypertension, GERD. Patient on the right side and shortening and external rotation of the right leg. Patient otherwise does not report any dizziness chest pain palpitation shortness of breath fevers chills nausea vomiting.  Pt has  Past Medical History:  Diagnosis Date   Anemia    vitamin d deficiency   Anxiety    Breast cancer ([place])    bilateral mastectomies.  different types of cancer in each breast   Cardiomyopathy due to chemotherapy [facility]) 2011   Chronic anticoagulation    Apixaban   Chronic pain 2019   Chronic SI joint pain    Coronary artery disease    DDD (degenerative disc disease), lumbar    First degree AV block    GERD (gastroesophageal reflux disease)    Hyperlipidemia    Hypertension    Insomnia    Lumbar herniated disc    Neuropathy    Paroxysmal atrial fibrillation (HCC)    Personal history of chemotherapy    Personal history of radiation therapy    S/P drug eluting coronary stent placement 08/02/2019   2.5 x 12 mm Resolute Onyx to pLAD   Valvular insufficiency    Mild   Review of Systems  Musculoskeletal:  Positive for gait problem.       Joint pain.  Rt hip.   All other systems reviewed and are negative.  Allergies  Allergen Reactions   Hydrocodone-Acetaminophen Nausea And Vomiting    Severe vomitting   Empagliflozin Diarrhea and Other (See Comments)    Jardiance- lightheaded and diarrhea   Morphine And Related Nausea And Vomiting    Pt preference    Ciprofloxacin Other (See Comments)    Joint pain    Gabapentin Other (See Comments)    Eyes blurry; vision  decreased, dizziness    Past Surgical History:  Procedure Laterality Date   APPENDECTOMY  1961   BREAST IMPLANT REMOVAL Bilateral 2016   d/t infection. nothing replaced   CARDIAC CATHETERIZATION  2018   no blockages. pt thought it was an MI, but it wasn't   CESAREAN SECTION     x 3   CORONARY STENT INTERVENTION N/A 08/02/2019   Procedure: CORONARY STENT INTERVENTION;  Surgeon: Wellington Hampshire, MD;  Location: [place] [facility];  Service: Cardiovascular;  Laterality: N/A;   EYE SURGERY Bilateral 2013   cataract extractions with iol. cornea replacement also   INTRAVASCULAR PRESSURE WIRE/FFR STUDY N/A 08/02/2019   Procedure: INTRAVASCULAR PRESSURE WIRE/FFR STUDY;  Surgeon: Wellington Hampshire, MD;  Location: [place] [facility];  Service: Cardiovascular;  Laterality: N/A;   KYPHOPLASTY N/A 06/17/2021   Procedure: L2 KYPHOPLASTY;  Surgeon: Hessie Knows, MD;  Location: [facility];  Service: Orthopedics;  Laterality: N/A;   LEFT HEART CATH AND CORONARY ANGIOGRAPHY Left 07/27/2019   Procedure: LEFT HEART CATH AND CORONARY ANGIOGRAPHY;  Surgeon: Corey Skains, MD;  Location: [place] [facility];  Service: Cardiovascular;  Laterality: Left;   LUMBAR LAMINECTOMY/DECOMPRESSION MICRODISCECTOMY N/A 06/29/2018   Procedure: LUMBAR LAMINECTOMY/DECOMPRESSION MICRODISCECTOMY 1 LEVEL-L4-5, L5-S1 LATERAL DISCECTOMY;  Surgeon: Meade Maw, MD;  Location: [facility];  Service: Neurosurgery;  Laterality: N/A;   MASTECTOMY Bilateral 2011, 2016   5 diff procedures. reconstruction saline removed d/t infx   nsvd     x 1   PLACEMENT OF BREAST IMPLANTS Bilateral 2016   PORT-A-CATH REMOVAL  2016   TONSILLECTOMY         MEDICATIONS: Current Outpatient Medications  Medication Instructions   alendronate (FOSAMAX) 70 mg, Oral, Weekly   amiodarone (PACERONE) 200 mg, Oral, 2 times daily   atorvastatin (LIPITOR) 10 mg, Oral, Daily   Cholecalciferol (VITAMIN D3 PO) 1 tablet, Oral, Every morning   Eliquis 5  mg, Oral, 2 times daily   furosemide (LASIX) 20 mg, Oral, Daily   levothyroxine (SYNTHROID) 25 mcg, Oral, Every morning   MAGNESIUM PO 1 tablet, Oral, Every morning   meclizine (ANTIVERT) 12.5 mg, Oral, 3 times daily   metoprolol succinate (TOPROL-XL) 12.5 mg, Oral, 2 times daily   Potassium Citrate 99 MG CAPS 1 tablet, Oral, Daily            ED Course: Pt in Ed is A/O and was hypotensive. We gave 500 ns and cont with MIVF. Vitals:   01/07/23 1704 01/07/23 1707 01/07/23 1800 01/07/23 1845  BP:    107/83  Pulse:   61 93  Resp:   12 14  Temp:  97.6 F (36.4 C)    SpO2:   99% 100%  Weight: 58.1 kg     Height: '5\' 3"'$  (1.6 m)      No intake/output data recorded. SpO2: 100 % Blood work in ed shows: Results for orders placed or performed during the hospital encounter of 01/07/23 (from the past 24 hour(s))  CBC with Differential     Status: Abnormal   Collection Time: 01/07/23  5:10 PM  Result Value Ref Range   WBC 5.8 4.0 - 10.5 K/uL   RBC 3.20 (L) 3.87 - 5.11 MIL/uL   Hemoglobin 10.6 (L) 12.0 - 15.0 g/dL   HCT 32.0 (L) 36.0 - 46.0 %   MCV 100.0 80.0 - 100.0 fL   MCH 33.1 26.0 - 34.0 pg   MCHC 33.1 30.0 - 36.0 g/dL   RDW 14.1 11.5 - 15.5 %   Platelets 308 150 - 400 K/uL   nRBC 0.0 0.0 - 0.2 %   Neutrophils Relative % 39 %   Neutro Abs 2.3 1.7 - 7.7 K/uL   Lymphocytes Relative 42 %   Lymphs Abs 2.5 0.7 - 4.0 K/uL   Monocytes Relative 11 %   Monocytes Absolute 0.6 0.1 - 1.0 K/uL   Eosinophils Relative 5 %   Eosinophils Absolute 0.3 0.0 - 0.5 K/uL   Basophils Relative 2 %   Basophils Absolute 0.1 0.0 - 0.1 K/uL   Immature Granulocytes 1 %   Abs Immature Granulocytes 0.05 0.00 - 0.07 K/uL  Basic metabolic panel     Status: Abnormal   Collection Time: 01/07/23  5:10 PM  Result Value Ref Range   Sodium 136 135 - 145 mmol/L   Potassium 3.3 (L) 3.5 - 5.1 mmol/L   Chloride 100 98 - 111 mmol/L   CO2 25 22 - 32 mmol/L   Glucose, Bld 161 (H) 70 - 99 mg/dL   BUN 17 8 - 23  mg/dL   Creatinine, Ser 1.08 (H) 0.44 - 1.00 mg/dL   Calcium 9.1 8.9 - 10.3 mg/dL   GFR, Estimated 53 (L) >60 mL/min   Anion gap 11 5 - 15  Protime-INR  Status: Abnormal   Collection Time: 01/07/23  5:10 PM  Result Value Ref Range   Prothrombin Time 19.5 (H) 11.4 - 15.2 seconds   INR 1.7 (H) 0.8 - 1.2  Type and screen     Status: None (Preliminary result)   Collection Time: 01/07/23  6:45 PM  Result Value Ref Range   ABO/RH(D) PENDING    Antibody Screen PENDING    Sample Expiration      01/10/2023,2359 Performed at [facility], [address] [place], [place] [address]     Unresulted Labs (From admission, onward)     Start     Ordered   01/07/23 1717  SARS Coronavirus 2 by RT PCR (hospital order, performed in [facility] hospital lab) *cepheid single result test* Anterior Nasal Swab  (Tier 2 - Symptomatic/Asymptomatic)  Once,   URGENT        01/07/23 1716           Pt has received : Orders Placed This Encounter  Procedures   SARS Coronavirus 2 by RT PCR (hospital order, performed in [place] hospital lab) *cepheid single result test* Anterior Nasal Swab    Standing Status:   Standing    Number of Occurrences:   1   DG Hip Unilat  With Pelvis 2-3 Views Right    Standing Status:   Standing    Number of Occurrences:   1    Order Specific Question:   Reason for Exam (SYMPTOM  OR DIAGNOSIS REQUIRED)    Answer:   pain s/p fall   DG Chest 1 View    Standing Status:   Standing    Number of Occurrences:   1    Order Specific Question:   Reason for Exam (SYMPTOM  OR DIAGNOSIS REQUIRED)    Answer:   pain s/p fall   CT HEAD WO CONTRAST (5MM)    Standing Status:   Standing    Number of Occurrences:   1   CT Cervical Spine Wo Contrast    Standing Status:   Standing    Number of Occurrences:   1   CBC with Differential    Standing Status:   Standing    Number of Occurrences:   1   Basic metabolic panel    Standing Status:   Standing    Number of  Occurrences:   1   Protime-INR    Standing Status:   Standing    Number of Occurrences:   1   Diet NPO time specified    Standing Status:   Standing    Number of Occurrences:   1   NPO Except for Meds    Standing Status:   Standing    Number of Occurrences:   1   Informed Consent Details: Physician/Practitioner Attestation; Transcribe to consent form and obtain patient signature    Standing Status:   Standing    Number of Occurrences:   1    Order Specific Question:   Physician/Practitioner attestation of informed consent for procedure/surgical case    Answer:   I, the physician/practitioner, attest that I have discussed with the patient the benefits, risks, side effects, alternatives, likelihood of achieving goals and potential problems during recovery for the procedure that I have provided informed consent.    Order Specific Question:   Procedure    Answer:   Right hip trochanteric femoral nail    Order Specific Question:   Physician/Practitioner performing the procedure    Answer:  Kurtis Bushman, MD    Order Specific Question:   Indication/Reason    Answer:   Right hip fracture, closed, displaced   Consult to hospitalist    Standing Status:   Standing    Number of Occurrences:   1    Order Specific Question:   Place call to:    Answer:   [id number]    Order Specific Question:   Reason for Consult    Answer:   Admit   Airborne and Contact precautions    Standing Status:   Standing    Number of Occurrences:   1   EKG 12-Lead    Standing Status:   Standing    Number of Occurrences:   1   ED EKG    Standing Status:   Standing    Number of Occurrences:   1    Order Specific Question:   Reason for Exam    Answer:   Chest Pain   Type and screen    Standing Status:   Standing    Number of Occurrences:   1    Meds ordered this encounter  Medications   ondansetron (ZOFRAN) injection 4 mg   fentaNYL (SUBLIMAZE) injection 25 mcg   fentaNYL (SUBLIMAZE) injection 25 mcg   potassium  chloride SA (KLOR-CON M) CR tablet 40 mEq   ceFAZolin (ANCEF) IVPB 2g/100 mL premix    Order Specific Question:   Antibiotic Indication:    Answer:   Surgical Prophylaxis   tranexamic acid (CYKLOKAPRON) IVPB 1,000 mg     Admission Imaging : DG Hip Unilat  With Pelvis 2-3 Views Right  Result Date: 01/07/2023 CLINICAL DATA:  Status post fall. EXAM: DG HIP (WITH OR WITHOUT PELVIS) 2-3V RIGHT COMPARISON:  None Available. FINDINGS: An acute, comminuted fracture deformity is seen extending through the inter trochanteric region of the proximal right femur. There is no evidence of dislocation. Moderate severity degenerative changes are seen in the form of joint space narrowing and acetabular sclerosis. IMPRESSION: Acute fracture of the proximal right femur. Electronically Signed   By: Virgina Norfolk M.D.   On: 01/07/2023 17:52   DG Chest 1 View  Result Date: 01/07/2023 CLINICAL DATA:  Pain status post fall EXAM: CHEST  1 VIEW COMPARISON:  None Available. FINDINGS: The cardiomediastinal silhouette is unchanged in contour. Calcified atherosclerosis of the aortic arch. No focal pulmonary opacity. No pleural effusion or pneumothorax. The visualized upper abdomen is unremarkable. No acute osseous abnormality. IMPRESSION: No acute cardiopulmonary abnormality. Electronically Signed   By: Beryle Flock M.D.   On: 01/07/2023 17:51   CT Cervical Spine Wo Contrast  Result Date: 01/07/2023 CLINICAL DATA:  Neck trauma.  Fall. EXAM: CT CERVICAL SPINE WITHOUT CONTRAST TECHNIQUE: Multidetector CT imaging of the cervical spine was performed without intravenous contrast. Multiplanar CT image reconstructions were also generated. RADIATION DOSE REDUCTION: This exam was performed according to the departmental dose-optimization program which includes automated exposure control, adjustment of the mA and/or kV according to patient size and/or use of iterative reconstruction technique. COMPARISON:  None Available. FINDINGS:  Alignment: There is straightening of usual cervical lordosis. Mild anterior subluxation of C3 on C4. Mild retrolisthesis of C5 on C6. Changes are nonspecific but likely degenerative. Normal alignment of the posterior elements. Skull base and vertebrae: Skull base appears intact. No vertebral compression deformities. No focal bone lesion or bone destruction. Soft tissues and spinal canal: No prevertebral soft tissue swelling. No abnormal paraspinal soft tissue mass or infiltration. Disc levels: Degenerative  changes throughout the cervical spine with narrowed disc spaces and endplate osteophyte formation throughout. Calcification of disc material. Disc osteophyte complexes cause anterior effacement of the thecal sac at multiple levels, most prominent at C5-6. Degenerative changes in the facet joints. Hypertrophic changes involving the facet joints and uncovertebral joints causes bone encroachment on the neural foramina at multiple levels bilaterally. Upper chest: Lung apices are clear. Other: None. IMPRESSION: 1. Prominent degenerative changes throughout the cervical spine. Anterior subluxation at C3-4 and retrolisthesis at C5-6 are nonspecific but likely degenerative. 2. No acute displaced fractures are identified. Electronically Signed   By: Lucienne Capers M.D.   On: 01/07/2023 17:43   CT HEAD WO CONTRAST (5MM)  Result Date: 01/07/2023 CLINICAL DATA:  Head trauma, minor (Age >= 65y) EXAM: CT HEAD WITHOUT CONTRAST TECHNIQUE: Contiguous axial images were obtained from the base of the skull through the vertex without intravenous contrast. RADIATION DOSE REDUCTION: This exam was performed according to the departmental dose-optimization program which includes automated exposure control, adjustment of the mA and/or kV according to patient size and/or use of iterative reconstruction technique. COMPARISON:  CT head 09/09/2021 FINDINGS: Brain: Cerebral ventricle sizes are concordant with the degree of cerebral volume  loss. Patchy and confluent areas of decreased attenuation are noted throughout the deep and periventricular white matter of the cerebral hemispheres bilaterally, compatible with chronic microvascular ischemic disease. No evidence of large-territorial acute infarction. No parenchymal hemorrhage. No mass lesion. No extra-axial collection. No mass effect or midline shift. No hydrocephalus. Basilar cisterns are patent. Vascular: No hyperdense vessel. Atherosclerotic calcifications are present within the cavernous internal carotid arteries. Skull: No acute fracture or focal lesion. Sinuses/Orbits: Paranasal sinuses and mastoid air cells are clear. Bilateral lens replacement. Otherwise the orbits are unremarkable. Other: None. IMPRESSION: No acute intracranial abnormality. Electronically Signed   By: Iven Finn M.D.   On: 01/07/2023 17:41      Physical Examination: Vitals:   01/07/23 1704 01/07/23 1707 01/07/23 1800 01/07/23 1845  BP:    107/83  Pulse:   61 93  Temp:  97.6 F (36.4 C)    Resp:   12 14  Height: '5\' 3"'$  (1.6 m)     Weight: 58.1 kg     SpO2:   99% 100%  BMI (Calculated): 22.68      Physical Exam HENT:     Head: Normocephalic and atraumatic.     Right Ear: External ear normal.     Left Ear: External ear normal.     Nose: Nose normal.     Mouth/Throat:     Mouth: Mucous membranes are moist.  Eyes:     Extraocular Movements: Extraocular movements intact.  Cardiovascular:     Rate and Rhythm: Normal rate and regular rhythm.     Pulses: Normal pulses.     Heart sounds: Normal heart sounds.  Pulmonary:     Effort: Pulmonary effort is normal.     Breath sounds: Normal breath sounds.  Abdominal:     General: Bowel sounds are normal.     Palpations: Abdomen is soft.  Musculoskeletal:        General: Swelling and deformity present.     Right hip: Deformity and tenderness present. Decreased range of motion.     Right lower leg: No edema.     Left lower leg: No edema.      Comments: Ext rotated.   Neurological:     Mental Status: She is alert.  Assessment and Plan: * Fall at home, initial encounter .  Closed right hip fracture, initial encounter ([place]) .  Paroxysmal atrial fibrillation (HCC) .  LBBB (left bundle branch block) .  Coronary artery disease involving native coronary artery of native heart .  Benign essential HTN .  Anemia .  Prediabetes .   > Fall @ home : Fall precaution and PT/Rehab for fall prevention and gait training on OPT plan. SW/CM consult for D/C plan and placement and DME .  > Closed Right Hip fracture: -Mechanical fall resulting in hip fracture -Orthopedics consult in AM-Dr Harlow Mares notified by EDMD. -NPO after midnight in anticipation of surgical repair tomorrow -SCDs overnight, start Lovenox post-operatively (or as per ortho) -CXR and EKG prior to surgery if not done in ER -Pain control with Robaxin, Vicodin, and Morphine prn -SW consult for rehab placement -Will need PT consult post-operatively -Hip fracture order set utilized   >PAF: Will follow cbc and monitor.  Resume anticoagulation 48-72 hours post op.   >CAD/LBBB: EKG today shows SR with  PR of 107.  Qtc of 507      EKG November 2022:   >HTN: Vitals:   01/07/23 1700 01/07/23 1845 01/07/23 1900  BP: (!) 131/104 107/83 119/71  Stable.  We will Hold her metoprolol.   >Anemia:    Latest Ref Rng & Units 01/07/2023    5:10 PM 10/30/2021    5:20 AM 10/04/2021    6:03 AM  CBC  WBC 4.0 - 10.5 K/uL 5.8  6.1  5.7   Hemoglobin 12.0 - 15.0 g/dL 10.6  10.8  12.6   Hematocrit 36.0 - 46.0 % 32.0  32.3  36.8   Platelets 150 - 400 K/uL 308  300  314   Stable. Type/cross.  >Prediabetes: NPO after midnight.  Diet per AM team.     DVT prophylaxis:  Eliquis Held.   Code Status:  Full code   Family Communication:  Glorene, Leitzke (Spouse)  [phone] (Mobile)   Disposition Plan:  TBD.  Consults called:  Orthopedic:   Dr.Bowers.  Admission status: Inpatient   Unit/ Expected LOS: Telemetry / 2-3 days.    Para Skeans MD [facility]  6 PM- 2 AM. Please contact me via secure Chat 6 PM-2 AM. [phone]( Pager ) To contact the [facility] Attending or Consulting provider [place] or covering provider during after hours [place], for this patient.   Check the care team in [facility] and look for a) attending/consulting [facility] provider listed and b) the [facility] team listed Log into [email or web address] and use [place]'s universal password to access. If you do not have the password, please contact the hospital operator. Locate the [facility] provider you are looking for under [facility] and page to a number that you can be directly reached. If you still have difficulty reaching the provider, please page the [facility] (Director on Call) for the Hospitalists listed on amion for assistance. [email or web address] 01/07/2023, 7:24 PM

## 2023-01-07 NOTE — ED Notes (Signed)
MD aware of BP. New orders placed.

## 2023-01-07 NOTE — Consult Note (Addendum)
ORTHOPAEDIC CONSULTATION  REQUESTING PHYSICIAN: Val Riles, MD  Chief Complaint: right hip pain  HPI: Kelsey Carter is a 77 y.o. female who complains of hip pain after a fall. The pain is sharp in character. The pain is severe and 10/10. The pain is worse with movement and better with rest. Denies any numbness, tingling or constitutional symptoms.  Past Medical History:  Diagnosis Date   Anemia    vitamin d deficiency   Anxiety    Breast cancer ([place])    bilateral mastectomies.  different types of cancer in each breast   Cardiomyopathy due to chemotherapy [facility]) 2011   Chronic anticoagulation    Apixaban   Chronic pain 2019   Chronic SI joint pain    Coronary artery disease    DDD (degenerative disc disease), lumbar    First degree AV block    GERD (gastroesophageal reflux disease)    Hyperlipidemia    Hypertension    Insomnia    Lumbar herniated disc    Neuropathy    Paroxysmal atrial fibrillation (HCC)    Personal history of chemotherapy    Personal history of radiation therapy    S/P drug eluting coronary stent placement 08/02/2019   2.5 x 12 mm Resolute Onyx to pLAD   Valvular insufficiency    Mild   Past Surgical History:  Procedure Laterality Date   APPENDECTOMY  1961   BREAST IMPLANT REMOVAL Bilateral 2016   d/t infection. nothing replaced   CARDIAC CATHETERIZATION  2018   no blockages. pt thought it was an MI, but it wasn't   CESAREAN SECTION     x 3   CORONARY STENT INTERVENTION N/A 08/02/2019   Procedure: CORONARY STENT INTERVENTION;  Surgeon: Wellington Hampshire, MD;  Location: [place] [facility];  Service: Cardiovascular;  Laterality: N/A;   EYE SURGERY Bilateral 2013   cataract extractions with iol. cornea replacement also   INTRAVASCULAR PRESSURE WIRE/FFR STUDY N/A 08/02/2019   Procedure: INTRAVASCULAR PRESSURE WIRE/FFR STUDY;  Surgeon: Wellington Hampshire, MD;  Location: [place] [facility];  Service: Cardiovascular;  Laterality: N/A;    KYPHOPLASTY N/A 06/17/2021   Procedure: L2 KYPHOPLASTY;  Surgeon: Hessie Knows, MD;  Location: [facility];  Service: Orthopedics;  Laterality: N/A;   LEFT HEART CATH AND CORONARY ANGIOGRAPHY Left 07/27/2019   Procedure: LEFT HEART CATH AND CORONARY ANGIOGRAPHY;  Surgeon: Corey Skains, MD;  Location: [place] [facility];  Service: Cardiovascular;  Laterality: Left;   LUMBAR LAMINECTOMY/DECOMPRESSION MICRODISCECTOMY N/A 06/29/2018   Procedure: LUMBAR LAMINECTOMY/DECOMPRESSION MICRODISCECTOMY 1 LEVEL-L4-5, L5-S1 LATERAL DISCECTOMY;  Surgeon: Meade Maw, MD;  Location: [facility];  Service: Neurosurgery;  Laterality: N/A;   MASTECTOMY Bilateral 2011, 2016   5 diff procedures. reconstruction saline removed d/t infx   nsvd     x 1   PLACEMENT OF BREAST IMPLANTS Bilateral 2016   PORT-A-CATH REMOVAL  2016   TONSILLECTOMY     Social History   Socioeconomic History   Marital status: Married    Spouse name: Eddie Dibbles   Number of children: 4   Years of education: 1 year of college   Highest education level: 12th grade  Occupational History   Occupation: Retired  Tobacco Use   Smoking status: Former    Packs/day: 0.25    Years: 3.00    Total pack years: 0.75    Types: Cigarettes    Quit date: 10/21/1965    Years since quitting: 57.2   Smokeless tobacco: Never  Vaping Use  Vaping Use: Never used  Substance and Sexual Activity   Alcohol use: No   Drug use: Yes    Comment: prescribed narcotics for back pain   Sexual activity: Not Currently  Other Topics Concern   Not on file  Social History Narrative   Not on file   Social Determinants of Health   Financial Resource Strain: Low Risk  (03/31/2018)   Overall Financial Resource Strain (CARDIA)    Difficulty of Paying Living Expenses: Not hard at all  Food Insecurity: No Food Insecurity (01/07/2023)   Hunger Vital Sign    Worried About Running Out of Food in the Last Year: Never true    Ran Out of Food in the Last Year: Never true   Transportation Needs: No Transportation Needs (01/07/2023)   PRAPARE - Hydrologist (Medical): No    Lack of Transportation (Non-Medical): No  Physical Activity: Inactive (03/31/2018)   Exercise Vital Sign    Days of Exercise per Week: 0 days    Minutes of Exercise per Session: 0 min  Stress: No Stress Concern Present (03/31/2018)   [place]    Feeling of Stress : Not at all  Social Connections: Unknown (03/31/2018)   Social Connection and Isolation Panel [NHANES]    Frequency of Communication with Friends and Family: Patient refused    Frequency of Social Gatherings with Friends and Family: Patient refused    Attends Religious Services: Patient refused    Marine scientist or Organizations: Patient refused    Attends Music therapist: Patient refused    Marital Status: Married   Family History  Problem Relation Age of Onset   Heart failure Mother    Hypertension Mother    Cervical cancer Mother    Renal Disease Mother    Cancer Father    Tuberculosis Maternal Grandfather    Breast cancer Paternal Grandmother    Breast cancer Paternal Aunt    Allergies  Allergen Reactions   Hydrocodone-Acetaminophen Nausea And Vomiting    Severe vomitting   Empagliflozin Diarrhea and Other (See Comments)    Jardiance- lightheaded and diarrhea   Morphine And Related Nausea And Vomiting    Pt preference    Ciprofloxacin Other (See Comments)    Joint pain    Gabapentin Other (See Comments)    Eyes blurry; vision decreased, dizziness    Prior to Admission medications   Medication Sig Start Date End Date Taking? Authorizing Provider  amiodarone (PACERONE) 200 MG tablet Take 200 mg by mouth daily. 04/17/21  Yes [provider]  atorvastatin (LIPITOR) 10 MG tablet Take 1 tablet (10 mg total) by mouth daily. 10/08/21  Yes Nolberto Hanlon, MD  Cholecalciferol 50 MCG (2000 UT)  CAPS Take 2,000 Units by mouth daily. 08/25/17  Yes [provider]  ELIQUIS 5 MG TABS tablet Take 5 mg by mouth 2 (two) times daily. 06/01/21  Yes [provider]  furosemide (LASIX) 20 MG tablet Take 1 tablet (20 mg total) by mouth daily. 10/30/21 01/07/23 Yes Vladimir Crofts, MD  levothyroxine (SYNTHROID) 25 MCG tablet Take 25 mcg by mouth in the morning. 06/11/21  Yes [provider]  metoprolol succinate (TOPROL-XL) 25 MG 24 hr tablet Take 0.5 tablets (12.5 mg total) by mouth 2 (two) times daily. 10/07/21  Yes Nolberto Hanlon, MD  alendronate (FOSAMAX) 70 MG tablet Take 70 mg by mouth once a week. Patient not taking: Reported  on 01/07/2023 08/21/21   [provider]  MAGNESIUM PO Take 1 tablet by mouth in the morning. Patient not taking: Reported on 01/07/2023    [provider]  meclizine (ANTIVERT) 12.5 MG tablet Take 12.5 mg by mouth 3 (three) times daily. Patient not taking: Reported on 01/07/2023 10/21/21   [provider]  Potassium Citrate 99 MG CAPS Take 1 tablet by mouth daily. Patient not taking: Reported on 01/07/2023    [provider]   DG C-Arm 1-60 Min-No Report  Result Date: 01/08/2023 Fluoroscopy was utilized by the requesting physician.  No radiographic interpretation.   DG Hip Unilat  With Pelvis 2-3 Views Right  Result Date: 01/07/2023 CLINICAL DATA:  Status post fall. EXAM: DG HIP (WITH OR WITHOUT PELVIS) 2-3V RIGHT COMPARISON:  None Available. FINDINGS: An acute, comminuted fracture deformity is seen extending through the inter trochanteric region of the proximal right femur. There is no evidence of dislocation. Moderate severity degenerative changes are seen in the form of joint space narrowing and acetabular sclerosis. IMPRESSION: Acute fracture of the proximal right femur. Electronically Signed   By: Virgina Norfolk M.D.   On: 01/07/2023 17:52   DG Chest 1 View  Result Date: 01/07/2023 CLINICAL DATA:  Pain status  post fall EXAM: CHEST  1 VIEW COMPARISON:  None Available. FINDINGS: The cardiomediastinal silhouette is unchanged in contour. Calcified atherosclerosis of the aortic arch. No focal pulmonary opacity. No pleural effusion or pneumothorax. The visualized upper abdomen is unremarkable. No acute osseous abnormality. IMPRESSION: No acute cardiopulmonary abnormality. Electronically Signed   By: Beryle Flock M.D.   On: 01/07/2023 17:51   CT Cervical Spine Wo Contrast  Result Date: 01/07/2023 CLINICAL DATA:  Neck trauma.  Fall. EXAM: CT CERVICAL SPINE WITHOUT CONTRAST TECHNIQUE: Multidetector CT imaging of the cervical spine was performed without intravenous contrast. Multiplanar CT image reconstructions were also generated. RADIATION DOSE REDUCTION: This exam was performed according to the departmental dose-optimization program which includes automated exposure control, adjustment of the mA and/or kV according to patient size and/or use of iterative reconstruction technique. COMPARISON:  None Available. FINDINGS: Alignment: There is straightening of usual cervical lordosis. Mild anterior subluxation of C3 on C4. Mild retrolisthesis of C5 on C6. Changes are nonspecific but likely degenerative. Normal alignment of the posterior elements. Skull base and vertebrae: Skull base appears intact. No vertebral compression deformities. No focal bone lesion or bone destruction. Soft tissues and spinal canal: No prevertebral soft tissue swelling. No abnormal paraspinal soft tissue mass or infiltration. Disc levels: Degenerative changes throughout the cervical spine with narrowed disc spaces and endplate osteophyte formation throughout. Calcification of disc material. Disc osteophyte complexes cause anterior effacement of the thecal sac at multiple levels, most prominent at C5-6. Degenerative changes in the facet joints. Hypertrophic changes involving the facet joints and uncovertebral joints causes bone encroachment on the  neural foramina at multiple levels bilaterally. Upper chest: Lung apices are clear. Other: None. IMPRESSION: 1. Prominent degenerative changes throughout the cervical spine. Anterior subluxation at C3-4 and retrolisthesis at C5-6 are nonspecific but likely degenerative. 2. No acute displaced fractures are identified. Electronically Signed   By: Lucienne Capers M.D.   On: 01/07/2023 17:43   CT HEAD WO CONTRAST (5MM)  Result Date: 01/07/2023 CLINICAL DATA:  Head trauma, minor (Age >= 65y) EXAM: CT HEAD WITHOUT CONTRAST TECHNIQUE: Contiguous axial images were obtained from the base of the skull through the vertex without intravenous contrast. RADIATION DOSE REDUCTION: This exam was performed according  to the departmental dose-optimization program which includes automated exposure control, adjustment of the mA and/or kV according to patient size and/or use of iterative reconstruction technique. COMPARISON:  CT head 09/09/2021 FINDINGS: Brain: Cerebral ventricle sizes are concordant with the degree of cerebral volume loss. Patchy and confluent areas of decreased attenuation are noted throughout the deep and periventricular white matter of the cerebral hemispheres bilaterally, compatible with chronic microvascular ischemic disease. No evidence of large-territorial acute infarction. No parenchymal hemorrhage. No mass lesion. No extra-axial collection. No mass effect or midline shift. No hydrocephalus. Basilar cisterns are patent. Vascular: No hyperdense vessel. Atherosclerotic calcifications are present within the cavernous internal carotid arteries. Skull: No acute fracture or focal lesion. Sinuses/Orbits: Paranasal sinuses and mastoid air cells are clear. Bilateral lens replacement. Otherwise the orbits are unremarkable. Other: None. IMPRESSION: No acute intracranial abnormality. Electronically Signed   By: Iven Finn M.D.   On: 01/07/2023 17:41    Positive ROS: All other systems have been reviewed and were  otherwise negative with the exception of those mentioned in the HPI and as above.  Physical Exam: General: Alert, no acute distress Cardiovascular: No pedal edema Respiratory: No cyanosis, no use of accessory musculature GI: No organomegaly, abdomen is soft and non-tender Skin: No lesions in the area of chief complaint Neurologic: Sensation intact distally Psychiatric: Patient is competent for consent with normal mood and affect Lymphatic: No axillary or cervical lymphadenopathy  MUSCULOSKELETAL: right leg short, externally rotated. Compartments soft. Good cap refill. Motor and sensory intact distally.  Assessment: Right hip intertrochanteric fracture, closed, displaced  Plan: Plan a right trochanteric femoral nail.  The diagnosis, risks, benefits and alternatives to treatment are all discussed in detail with the patient and family. Risks include but are not limited to bleeding, infection, deep vein thrombosis, pulmonary embolism, nerve or vascular injury, non-union, repeat operation, persistent pain, weakness, stiffness and death. She understands and is eager to proceed.     Lovell Sheehan, MD    01/08/2023 8:42 AM

## 2023-01-07 NOTE — ED Notes (Signed)
Pt gone to CT 

## 2023-01-07 NOTE — ED Triage Notes (Signed)
Pt to ED via [facility] from home for Right hip pain s/p fall. Pt has neuropathy in her feet and missed a step. Pt did not have LOC and did not hit her head. Pt is on Eliquis. Pt has shortening and external rotation to the right leg. Pt declined pain medication with EMS. Pt is A & O on arrival and is in NAD.

## 2023-01-07 NOTE — ED Provider Notes (Signed)
[facility] Provider Note    Event Date/Time   First MD Initiated Contact with Patient 01/07/23 1658     (approximate)   History   Fall and Hip Pain   HPI  Kelsey Carter is a 77 y.o. female with paroxysmal A-fib on Eliquis who comes in with concern for a fall.  Patient reports that she has got neuropathy and she mechanical fall in which she fell onto her right hip.  She does not think that she hit her head but she is on Eliquis.  She denies any other injuries other than right hip pain.  Denies any chest pain, abdominal pain.  She denies having any symptoms prior to the fall or LOC.      Physical Exam   Triage Vital Signs: ED Triage Vitals  Enc Vitals Group     BP      Pulse      Resp      Temp      Temp src      SpO2      Weight      Height      Head Circumference      Peak Flow      Pain Score      Pain Loc      Pain Edu?      Excl. in [place]?     Most recent vital signs: Vitals:   01/07/23 1900 01/07/23 1950  BP: 119/71 (!) 86/53  Pulse: 83 65  Resp: 16 13  Temp:    SpO2: 96% 100%     General: Awake, no distress.  CV:  Good peripheral perfusion.  Resp:  Normal effort.  Abd:  No distention.  Nontender Other:  Able to flex and extend her ankle.  2+ distal pulse.  Sensation intact.  Pain at the right hip.   ED Results / Procedures / Treatments   Labs (all labs ordered are listed, but only abnormal results are displayed) Labs Reviewed  CBC WITH DIFFERENTIAL/PLATELET - Abnormal; Notable for the following components:      Result Value   RBC 3.20 (*)    Hemoglobin 10.6 (*)    HCT 32.0 (*)    All other components within normal limits  BASIC METABOLIC PANEL - Abnormal; Notable for the following components:   Potassium 3.3 (*)    Glucose, Bld 161 (*)    Creatinine, Ser 1.08 (*)    GFR, Estimated 53 (*)    All other components within normal limits  PROTIME-INR - Abnormal; Notable for the following components:   Prothrombin  Time 19.5 (*)    INR 1.7 (*)    All other components within normal limits  SARS CORONAVIRUS 2 BY RT PCR  CBC  BASIC METABOLIC PANEL  TYPE AND SCREEN     EKG  My interpretation of EKG:  Sinus rate of 63 without any ST elevation or T wave inversion with left bundle branch block type I AV block  RADIOLOGY I have reviewed the xray personally and interpreted and patient has right hip fracture   PROCEDURES:  Critical Care performed: No  .1-3 Lead EKG Interpretation  Performed by: Vanessa [place], MD Authorized by: Vanessa Foley, MD     Interpretation: normal     ECG rate:  60   ECG rate assessment: normal     Rhythm: sinus rhythm     Ectopy: none     Conduction: normal  MEDICATIONS ORDERED IN ED: Medications  potassium chloride SA (KLOR-CON M) CR tablet 40 mEq (has no administration in time range)  ceFAZolin (ANCEF) IVPB 2g/100 mL premix (has no administration in time range)  tranexamic acid (CYKLOKAPRON) IVPB 1,000 mg (has no administration in time range)  atorvastatin (LIPITOR) tablet 10 mg (has no administration in time range)  levothyroxine (SYNTHROID) tablet 50 mcg (has no administration in time range)  methocarbamol (ROBAXIN) tablet 500 mg (has no administration in time range)    Or  methocarbamol (ROBAXIN) 500 mg in dextrose 5 % 50 mL IVPB (has no administration in time range)  docusate sodium (COLACE) capsule 100 mg (100 mg Oral Patient Refused/Not Given 01/07/23 2203)  polyethylene glycol (MIRALAX / GLYCOLAX) packet 17 g (has no administration in time range)  bisacodyl (DULCOLAX) EC tablet 5 mg (has no administration in time range)  lactated ringers infusion ( Intravenous New Bag/Given 01/07/23 2202)  ondansetron (ZOFRAN) injection 4 mg (4 mg Intravenous Given 01/07/23 1715)  fentaNYL (SUBLIMAZE) injection 25 mcg (25 mcg Intravenous Given 01/07/23 1714)  fentaNYL (SUBLIMAZE) injection 25 mcg (25 mcg Intravenous Given 01/07/23 1846)  sodium chloride 0.9 % bolus  500 mL (500 mLs Intravenous New Bag/Given 01/07/23 1953)     IMPRESSION / MDM / ASSESSMENT AND PLAN / ED COURSE  I reviewed the triage vital signs and the nursing notes.   Patient's presentation is most consistent with acute presentation with potential threat to life or bodily function.   Is in with a fall differential is intracranial hemorrhage, cervical fracture, hip fracture.  Good distal pulse.  Patient given some IV fentanyl and Zofran to help with pain.  CT head negative CT cervical and does have some concern for some subluxation that looks more likely degenerative.  Did not really have any neck pain.  X-ray confirms hip fracture.  Discussed with Dr. Harlow Mares.  Recommended holding Eliquis plan for surgery in the morning.  COVID is negative INR slightly elevated.  BMP shows slightly low potassium.  Creatinine around baseline. Cbc normal white count   The patient is on the cardiac monitor to evaluate for evidence of arrhythmia and/or significant heart rate changes.      FINAL CLINICAL IMPRESSION(S) / ED DIAGNOSES   Final diagnoses:  Other fracture of right femur, initial encounter for closed fracture ([place])  Fall, initial encounter     Rx / DC Orders   ED Discharge Orders     None        Note:  This document was prepared using Dragon voice recognition software and may include unintentional dictation errors.   Vanessa Holland, MD 01/07/23 2218

## 2023-01-07 NOTE — ED Notes (Signed)
Patient transported to CT 

## 2023-01-08 ENCOUNTER — Encounter: Payer: Self-pay | Admitting: Internal Medicine

## 2023-01-08 ENCOUNTER — Other Ambulatory Visit: Payer: Self-pay

## 2023-01-08 ENCOUNTER — Inpatient Hospital Stay: Payer: PPO | Admitting: Anesthesiology

## 2023-01-08 ENCOUNTER — Encounter
Admission: EM | Disposition: A | Discharge status: Home Health | Payer: Self-pay | Source: Home / Self Care | Attending: Student

## 2023-01-08 ENCOUNTER — Inpatient Hospital Stay: Payer: PPO

## 2023-01-08 DIAGNOSIS — Y92009 Unspecified place in unspecified non-institutional (private) residence as the place of occurrence of the external cause: Secondary | ICD-10-CM | POA: Diagnosis not present

## 2023-01-08 DIAGNOSIS — I10 Essential (primary) hypertension: Secondary | ICD-10-CM | POA: Diagnosis not present

## 2023-01-08 DIAGNOSIS — W19XXXA Unspecified fall, initial encounter: Secondary | ICD-10-CM | POA: Diagnosis not present

## 2023-01-08 HISTORY — PX: INTRAMEDULLARY (IM) NAIL INTERTROCHANTERIC: SHX5875

## 2023-01-08 LAB — HEMOGLOBIN AND HEMATOCRIT, BLOOD
HCT: 23.3 % — ABNORMAL LOW (ref 36.0–46.0)
Hemoglobin: 7.8 g/dL — ABNORMAL LOW (ref 12.0–15.0)

## 2023-01-08 LAB — CBC
HCT: 24.6 % — ABNORMAL LOW (ref 36.0–46.0)
Hemoglobin: 8.2 g/dL — ABNORMAL LOW (ref 12.0–15.0)
MCH: 32.9 pg (ref 26.0–34.0)
MCHC: 33.3 g/dL (ref 30.0–36.0)
MCV: 98.8 fL (ref 80.0–100.0)
Platelets: 262 10*3/uL (ref 150–400)
RBC: 2.49 MIL/uL — ABNORMAL LOW (ref 3.87–5.11)
RDW: 14.1 % (ref 11.5–15.5)
WBC: 5.8 10*3/uL (ref 4.0–10.5)
nRBC: 0.3 % — ABNORMAL HIGH (ref 0.0–0.2)

## 2023-01-08 LAB — BASIC METABOLIC PANEL
Anion gap: 6 (ref 5–15)
BUN: 18 mg/dL (ref 8–23)
CO2: 26 mmol/L (ref 22–32)
Calcium: 8.7 mg/dL — ABNORMAL LOW (ref 8.9–10.3)
Chloride: 103 mmol/L (ref 98–111)
Creatinine, Ser: 1 mg/dL (ref 0.44–1.00)
GFR, Estimated: 58 mL/min — ABNORMAL LOW (ref >60)
Glucose, Bld: 118 mg/dL — ABNORMAL HIGH (ref 70–99)
Potassium: 3.9 mmol/L (ref 3.5–5.1)
Sodium: 135 mmol/L (ref 135–145)

## 2023-01-08 LAB — FOLATE: Folate: 19 ng/mL (ref >5.9)

## 2023-01-08 LAB — IRON AND TIBC
Iron: 43 ug/dL (ref 28–170)
Saturation Ratios: 13 % (ref 10.4–31.8)
TIBC: 321 ug/dL (ref 250–450)
UIBC: 278 ug/dL

## 2023-01-08 LAB — PHOSPHORUS: Phosphorus: 3.9 mg/dL (ref 2.5–4.6)

## 2023-01-08 LAB — MAGNESIUM: Magnesium: 2 mg/dL (ref 1.7–2.4)

## 2023-01-08 LAB — VITAMIN D 25 HYDROXY (VIT D DEFICIENCY, FRACTURES): Vit D, 25-Hydroxy: 45.53 ng/mL (ref 30–100)

## 2023-01-08 SURGERY — FIXATION, FRACTURE, INTERTROCHANTERIC, WITH INTRAMEDULLARY ROD
Anesthesia: General | Laterality: Right

## 2023-01-08 MED ORDER — OXYCODONE HCL 5 MG/5ML PO SOLN: 5.0000 mg | Freq: Once | ORAL | Status: DC | PRN

## 2023-01-08 MED ORDER — ZOLPIDEM TARTRATE 5 MG PO TABS
5.0000 mg | ORAL_TABLET | Freq: Every evening | ORAL | Status: DC | PRN
  Administered 2023-01-10: 5 mg via ORAL
  Filled 2023-01-08 (×2): qty 1

## 2023-01-08 MED ORDER — TRANEXAMIC ACID 1000 MG/10ML IV SOLN
INTRAVENOUS | Status: AC
  Filled 2023-01-08: qty 10

## 2023-01-08 MED ORDER — LACTATED RINGERS IV SOLN: INTRAVENOUS | Status: DC

## 2023-01-08 MED ORDER — 0.9 % SODIUM CHLORIDE (POUR BTL) OPTIME
TOPICAL | Status: DC | PRN
  Administered 2023-01-08: 500 mL

## 2023-01-08 MED ORDER — ONDANSETRON HCL 4 MG/2ML IJ SOLN
INTRAMUSCULAR | Status: DC | PRN
  Administered 2023-01-08: 4 mg via INTRAVENOUS

## 2023-01-08 MED ORDER — DEXAMETHASONE SODIUM PHOSPHATE 10 MG/ML IJ SOLN
INTRAMUSCULAR | Status: DC | PRN
  Administered 2023-01-08: 10 mg via INTRAVENOUS

## 2023-01-08 MED ORDER — PROPOFOL 10 MG/ML IV BOLUS
INTRAVENOUS | Status: AC
  Filled 2023-01-08: qty 20

## 2023-01-08 MED ORDER — ORAL CARE MOUTH RINSE: 15.0000 mL | Freq: Once | OROMUCOSAL | Status: AC

## 2023-01-08 MED ORDER — TRANEXAMIC ACID-NACL 1000-0.7 MG/100ML-% IV SOLN
INTRAVENOUS | Status: AC
  Filled 2023-01-08: qty 100

## 2023-01-08 MED ORDER — FENTANYL CITRATE (PF) 100 MCG/2ML IJ SOLN
INTRAMUSCULAR | Status: AC
  Filled 2023-01-08: qty 2

## 2023-01-08 MED ORDER — MIDAZOLAM HCL 2 MG/2ML IJ SOLN
INTRAMUSCULAR | Status: AC
  Filled 2023-01-08: qty 2

## 2023-01-08 MED ORDER — ONDANSETRON HCL 4 MG PO TABS: 4.0000 mg | ORAL_TABLET | Freq: Four times a day (QID) | ORAL | Status: DC | PRN

## 2023-01-08 MED ORDER — DOCUSATE SODIUM 100 MG PO CAPS
100.0000 mg | ORAL_CAPSULE | Freq: Two times a day (BID) | ORAL | Status: DC
  Administered 2023-01-08 – 2023-01-11 (×7): 100 mg via ORAL
  Filled 2023-01-08 (×8): qty 1

## 2023-01-08 MED ORDER — PROPOFOL 1000 MG/100ML IV EMUL
INTRAVENOUS | Status: AC
  Filled 2023-01-08: qty 100

## 2023-01-08 MED ORDER — KETOROLAC TROMETHAMINE 15 MG/ML IJ SOLN
INTRAMUSCULAR | Status: AC
  Filled 2023-01-08: qty 1

## 2023-01-08 MED ORDER — PHENYLEPHRINE HCL-NACL 20-0.9 MG/250ML-% IV SOLN
INTRAVENOUS | Status: DC | PRN
  Administered 2023-01-08: 20 ug/min via INTRAVENOUS

## 2023-01-08 MED ORDER — METHOCARBAMOL 500 MG PO TABS: 500.0000 mg | ORAL_TABLET | Freq: Four times a day (QID) | ORAL | Status: DC | PRN

## 2023-01-08 MED ORDER — LACTATED RINGERS IV SOLN: INTRAVENOUS | Status: DC | PRN

## 2023-01-08 MED ORDER — LIDOCAINE HCL (CARDIAC) PF 100 MG/5ML IV SOSY
PREFILLED_SYRINGE | INTRAVENOUS | Status: DC | PRN
  Administered 2023-01-08: 100 mg via INTRAVENOUS

## 2023-01-08 MED ORDER — METHOCARBAMOL 1000 MG/10ML IJ SOLN: 500.0000 mg | Freq: Four times a day (QID) | INTRAVENOUS | Status: DC | PRN

## 2023-01-08 MED ORDER — ONDANSETRON HCL 4 MG/2ML IJ SOLN: 4.0000 mg | Freq: Once | INTRAMUSCULAR | Status: DC | PRN

## 2023-01-08 MED ORDER — PROPOFOL 10 MG/ML IV BOLUS
INTRAVENOUS | Status: DC | PRN
  Administered 2023-01-08: 80 mg via INTRAVENOUS

## 2023-01-08 MED ORDER — AMIODARONE HCL 200 MG PO TABS
200.0000 mg | ORAL_TABLET | Freq: Every day | ORAL | Status: DC
  Administered 2023-01-08 – 2023-01-11 (×4): 200 mg via ORAL
  Filled 2023-01-08 (×4): qty 1

## 2023-01-08 MED ORDER — ACETAMINOPHEN 325 MG PO TABS: 325.0000 mg | ORAL_TABLET | Freq: Four times a day (QID) | ORAL | Status: DC | PRN

## 2023-01-08 MED ORDER — MAGNESIUM CITRATE PO SOLN: 1.0000 | Freq: Once | ORAL | Status: DC | PRN

## 2023-01-08 MED ORDER — ADULT MULTIVITAMIN W/MINERALS CH
1.0000 | ORAL_TABLET | Freq: Every day | ORAL | Status: DC
  Administered 2023-01-08 – 2023-01-11 (×4): 1 via ORAL
  Filled 2023-01-08 (×4): qty 1

## 2023-01-08 MED ORDER — OXYCODONE HCL 5 MG PO TABS: 5.0000 mg | ORAL_TABLET | Freq: Once | ORAL | Status: DC | PRN

## 2023-01-08 MED ORDER — CEFAZOLIN SODIUM-DEXTROSE 2-4 GM/100ML-% IV SOLN
INTRAVENOUS | Status: AC
  Filled 2023-01-08: qty 100

## 2023-01-08 MED ORDER — POLYETHYLENE GLYCOL 3350 17 G PO PACK: 17.0000 g | PACK | Freq: Every day | ORAL | Status: DC | PRN

## 2023-01-08 MED ORDER — CEFAZOLIN SODIUM-DEXTROSE 1-4 GM/50ML-% IV SOLN
1.0000 g | Freq: Four times a day (QID) | INTRAVENOUS | Status: AC
  Administered 2023-01-08 – 2023-01-09 (×3): 1 g via INTRAVENOUS
  Filled 2023-01-08 (×4): qty 50

## 2023-01-08 MED ORDER — MIDAZOLAM HCL 2 MG/2ML IJ SOLN
INTRAMUSCULAR | Status: DC | PRN
  Administered 2023-01-08 (×2): 1 mg via INTRAVENOUS

## 2023-01-08 MED ORDER — BUPIVACAINE HCL (PF) 0.25 % IJ SOLN
INTRAMUSCULAR | Status: AC
  Filled 2023-01-08: qty 30

## 2023-01-08 MED ORDER — BISACODYL 10 MG RE SUPP: 10.0000 mg | Freq: Every day | RECTAL | Status: DC | PRN

## 2023-01-08 MED ORDER — CHLORHEXIDINE GLUCONATE 0.12 % MT SOLN
OROMUCOSAL | Status: AC
  Administered 2023-01-08: 15 mL via OROMUCOSAL
  Filled 2023-01-08: qty 15

## 2023-01-08 MED ORDER — METOCLOPRAMIDE HCL 5 MG/ML IJ SOLN: 5.0000 mg | Freq: Three times a day (TID) | INTRAMUSCULAR | Status: DC | PRN

## 2023-01-08 MED ORDER — METOCLOPRAMIDE HCL 5 MG PO TABS: 5.0000 mg | ORAL_TABLET | Freq: Three times a day (TID) | ORAL | Status: DC | PRN

## 2023-01-08 MED ORDER — FENTANYL CITRATE (PF) 100 MCG/2ML IJ SOLN: 25.0000 ug | INTRAMUSCULAR | Status: DC | PRN

## 2023-01-08 MED ORDER — BUPIVACAINE-EPINEPHRINE (PF) 0.25% -1:200000 IJ SOLN
INTRAMUSCULAR | Status: DC | PRN
  Administered 2023-01-08: 30 mL

## 2023-01-08 MED ORDER — DIPHENHYDRAMINE HCL 12.5 MG/5ML PO ELIX: 12.5000 mg | ORAL_SOLUTION | ORAL | Status: DC | PRN

## 2023-01-08 MED ORDER — ACETAMINOPHEN 10 MG/ML IV SOLN
INTRAVENOUS | Status: DC | PRN
  Administered 2023-01-08: 1000 mg via INTRAVENOUS

## 2023-01-08 MED ORDER — FENTANYL CITRATE (PF) 100 MCG/2ML IJ SOLN
INTRAMUSCULAR | Status: DC | PRN
  Administered 2023-01-08 (×2): 50 ug via INTRAVENOUS

## 2023-01-08 MED ORDER — ONDANSETRON HCL 4 MG/2ML IJ SOLN: 4.0000 mg | Freq: Four times a day (QID) | INTRAMUSCULAR | Status: DC | PRN

## 2023-01-08 MED ORDER — ACETAMINOPHEN 10 MG/ML IV SOLN
INTRAVENOUS | Status: AC
  Filled 2023-01-08: qty 100

## 2023-01-08 MED ORDER — KETOROLAC TROMETHAMINE 15 MG/ML IJ SOLN
7.5000 mg | Freq: Four times a day (QID) | INTRAMUSCULAR | Status: AC
  Administered 2023-01-08 – 2023-01-09 (×4): 7.5 mg via INTRAVENOUS
  Filled 2023-01-08 (×4): qty 1

## 2023-01-08 MED ORDER — CHLORHEXIDINE GLUCONATE 0.12 % MT SOLN: 15.0000 mL | Freq: Once | OROMUCOSAL | Status: AC

## 2023-01-08 MED ORDER — EPHEDRINE SULFATE (PRESSORS) 50 MG/ML IJ SOLN
INTRAMUSCULAR | Status: DC | PRN
  Administered 2023-01-08 (×2): 5 mg via INTRAVENOUS

## 2023-01-08 MED ORDER — EPINEPHRINE PF 1 MG/ML IJ SOLN
INTRAMUSCULAR | Status: AC
  Filled 2023-01-08: qty 1

## 2023-01-08 SURGICAL SUPPLY — 39 items
BIT DRILL CANN 16 HIP (BIT) IMPLANT
BIT DRILL CANN STP 6/9 HIP (BIT) IMPLANT
BLADE HELICAL TFNA 90 (Anchor) IMPLANT
BNDG COHESIVE 4X5 TAN STRL LF (GAUZE/BANDAGES/DRESSINGS) IMPLANT
BRUSH SCRUB EZ  4% CHG (MISCELLANEOUS) ×1
BRUSH SCRUB EZ 4% CHG (MISCELLANEOUS) ×2 IMPLANT
CHLORAPREP W/TINT 26 (MISCELLANEOUS) ×1 IMPLANT
DRAPE 3/4 80X56 (DRAPES) ×1 IMPLANT
DRAPE STERI IOBAN 125X83 (DRAPES) IMPLANT
DRAPE U-SHAPE 47X51 STRL (DRAPES) ×1 IMPLANT
DRSG AQUACEL AG ADV 3.5X 4 (GAUZE/BANDAGES/DRESSINGS) IMPLANT
ELECT REM PT RETURN 9FT ADLT (ELECTROSURGICAL) ×1
ELECTRODE REM PT RTRN 9FT ADLT (ELECTROSURGICAL) ×1 IMPLANT
GAUZE XEROFORM 1X8 LF (GAUZE/BANDAGES/DRESSINGS) ×1 IMPLANT
GLOVE BIOGEL PI IND STRL 8.5 (GLOVE) ×1 IMPLANT
GLOVE SURG ORTHO 8.5 STRL (GLOVE) ×1 IMPLANT
GOWN STRL REUS W/ TWL LRG LVL3 (GOWN DISPOSABLE) ×1 IMPLANT
GOWN STRL REUS W/ TWL XL LVL3 (GOWN DISPOSABLE) ×1 IMPLANT
GOWN STRL REUS W/TWL LRG LVL3 (GOWN DISPOSABLE) ×1
GOWN STRL REUS W/TWL XL LVL3 (GOWN DISPOSABLE) ×1
GUIDEWIRE 3.2X400 (WIRE) IMPLANT
KIT PATIENT CARE HANA TABLE (KITS) ×1 IMPLANT
KIT TURNOVER CYSTO (KITS) ×1 IMPLANT
MANIFOLD NEPTUNE II (INSTRUMENTS) ×1 IMPLANT
MAT ABSORB  FLUID 56X50 GRAY (MISCELLANEOUS) ×1
MAT ABSORB FLUID 56X50 GRAY (MISCELLANEOUS) ×1 IMPLANT
NAIL CANN TFNA 9 130D 360 (Nail) IMPLANT
NDL SPNL 20GX3.5 QUINCKE YW (NEEDLE) ×1 IMPLANT
NEEDLE SPNL 20GX3.5 QUINCKE YW (NEEDLE) ×1 IMPLANT
NS IRRIG 1000ML POUR BTL (IV SOLUTION) ×1 IMPLANT
PACK HIP COMPR (MISCELLANEOUS) ×1 IMPLANT
STAPLER SKIN PROX 35W (STAPLE) ×1 IMPLANT
SUT VIC AB 0 CT1 36 (SUTURE) ×1 IMPLANT
SUT VIC AB 2-0 CT1 27 (SUTURE) ×2
SUT VIC AB 2-0 CT1 TAPERPNT 27 (SUTURE) ×2 IMPLANT
SYR 30ML LL (SYRINGE) ×1 IMPLANT
TOWEL OR 17X26 4PK STRL BLUE (TOWEL DISPOSABLE) ×1 IMPLANT
TRAP FLUID SMOKE EVACUATOR (MISCELLANEOUS) ×1 IMPLANT
WATER STERILE IRR 500ML POUR (IV SOLUTION) ×1 IMPLANT

## 2023-01-08 NOTE — Op Note (Signed)
DATE OF SURGERY:  01/08/2023  TIME: 8:42 AM  PATIENT NAME:  Kelsey Carter  AGE: 77 y.o.  PRE-OPERATIVE DIAGNOSIS:  Right Hip fracture  POST-OPERATIVE DIAGNOSIS:  SAME  PROCEDURE:  Right INTRAMEDULLARY (IM) NAIL INTERTROCHANTERIC  SURGEON:  Lovell Sheehan  EBL:  50 cc  COMPLICATIONS:  none apparent  OPERATIVE IMPLANTS: Synthes trochanteric femoral nail  360 mm by 9 mm  with interlocking helical blade  90 mm  PREOPERATIVE INDICATIONS:  [name] is a 78 y.o. year old who fell and suffered a hip fracture. She was brought into the ER and then admitted and optimized and then elected for surgical intervention.    The risks benefits and alternatives were discussed with the patient including but not limited to the risks of nonoperative treatment, versus surgical intervention including infection, bleeding, nerve injury, malunion, nonunion, hardware prominence, hardware failure, need for hardware removal, blood clots, cardiopulmonary complications, morbidity, mortality, among others, and they were willing to proceed.    OPERATIVE PROCEDURE:  The patient was brought to the operating room and placed in the supine position.  General anesthesia was administered. She was placed on the fracture table.  Closed reduction was performed under C-arm guidance. The length of the femur was also measured using fluoroscopy. Time out was then performed after sterile prep and drape. She received preoperative antibiotics.  Incision was made proximal to the greater trochanter. A guidewire was placed in the appropriate position. Confirmation was made on AP and lateral views. The above-named nail was opened. I opened the proximal femur with a reamer. I then placed the nail by hand easily down. I did not need to ream the femur.  Once the nail was completely seated, I placed a guidepin into the femoral head into the center center position through a second incision.  I measured the length, and then reamed  the lateral cortex and up into the head. I then placed the helical blade. Slight compression was applied. Anatomic fixation achieved. Bone quality was poor.  I then secured the proximal interlock.  I then removed the instruments, and took final C-arm pictures AP and lateral the entire length of the leg. Anatomic reconstruction was achieved, and the wounds were irrigated copiously and closed with Vicryl  followed by staples and dry sterile dressing. Sponge and needle count were correct.   The patient was awakened and returned to PACU in stable and satisfactory condition. There no complications and the patient tolerated the procedure well.  She will be weightbearing as tolerated.    Lovell Sheehan

## 2023-01-08 NOTE — Progress Notes (Addendum)
1802 Pt ambulated in room up to chair for dinner. Tolerated well. Possessions and call bell within reach   1832 Dr Harlow Mares made aware that pt had moderate breakthrough drainage to right aquacel hip dressing, dressing was reinforced.

## 2023-01-08 NOTE — Transfer of Care (Signed)
Immediate Anesthesia Transfer of Care Note  Patient: Kelsey Carter  Procedure(s) Performed: INTRAMEDULLARY (IM) NAIL INTERTROCHANTERIC (Right)  Patient Location: PACU  Anesthesia Type:General  Level of Consciousness: drowsy and patient cooperative  Airway & Oxygen Therapy: Patient connected to nasal cannula oxygen  Post-op Assessment: Report given to RN and Post -op Vital signs reviewed and stable  Post vital signs: stable  Last Vitals:  Vitals Value Taken Time  BP 113/54 01/08/23 0840  Temp    Pulse 86 01/08/23 0842  Resp 16 01/08/23 0842  SpO2 100 % 01/08/23 0842  Vitals shown include unvalidated device data.  Last Pain:  Vitals:   01/08/23 0649  TempSrc: Temporal  PainSc: 0-No pain      Patients Stated Pain Goal: 0 (29/93/71 6967)  Complications: No notable events documented.

## 2023-01-08 NOTE — Plan of Care (Signed)
  Problem: Education: Goal: Knowledge of General Education information will improve Description: Including pain rating scale, medication(s)/side effects and non-pharmacologic comfort measures Outcome: Progressing   Problem: Activity: Goal: Risk for activity intolerance will decrease Outcome: Progressing   Problem: Pain Managment: Goal: General experience of comfort will improve Outcome: Progressing

## 2023-01-08 NOTE — Anesthesia Postprocedure Evaluation (Signed)
Anesthesia Post Note  Patient: Kelsey Carter  Procedure(s) Performed: INTRAMEDULLARY (IM) NAIL INTERTROCHANTERIC (Right)  Patient location during evaluation: PACU Anesthesia Type: General Level of consciousness: awake and alert Pain management: pain level controlled Vital Signs Assessment: post-procedure vital signs reviewed and stable Respiratory status: spontaneous breathing, nonlabored ventilation, respiratory function stable and patient connected to nasal cannula oxygen Cardiovascular status: blood pressure returned to baseline and stable Postop Assessment: no apparent nausea or vomiting Anesthetic complications: no   There were no known notable events for this encounter.   Last Vitals:  Vitals:   01/08/23 0915 01/08/23 0941  BP: (!) 114/55 110/62  Pulse: 70 77  Resp: 12 16  Temp: 36.6 C 36.5 C  SpO2: 100% 99%    Last Pain:  Vitals:   01/08/23 0915  TempSrc:   PainSc: 0-No pain                 Arita Miss

## 2023-01-08 NOTE — Anesthesia Preprocedure Evaluation (Signed)
Anesthesia Evaluation  Patient identified by MRN, date of birth, ID band Patient awake    Reviewed: Allergy & Precautions, NPO status , Patient's Chart, lab work & pertinent test results  History of Anesthesia Complications Negative for: history of anesthetic complications  Airway Mallampati: II  TM Distance: >3 FB Neck ROM: Full    Dental  (+) Teeth Intact, Caps   Pulmonary neg pulmonary ROS, neg sleep apnea, neg COPD, Patient abstained from smoking.Not current smoker, former smoker   Pulmonary exam normal breath sounds clear to auscultation       Cardiovascular Exercise Tolerance: Good METShypertension, + CAD and + Cardiac Stents  (-) Past MI + dysrhythmias Atrial Fibrillation  Rhythm:Irregular Rate:Normal - Systolic murmurs TTE 7209:  1. Left ventricular ejection fraction, by estimation, is 45 to 50%. The  left ventricle has mildly decreased function. The left ventricle has no  regional wall motion abnormalities. There is mild left ventricular  hypertrophy. Left ventricular diastolic  parameters are consistent with Grade I diastolic dysfunction (impaired  relaxation).   2. Right ventricular systolic function is normal. The right ventricular  size is normal.   3. The mitral valve is normal in structure. Mild mitral valve  regurgitation. No evidence of mitral stenosis.   4. The aortic valve is normal in structure. Aortic valve regurgitation is  not visualized. No aortic stenosis is present.   5. The inferior vena cava is normal in size with greater than 50%  respiratory variability, suggesting right atrial pressure of 3 mmHg.     Neuro/Psych  PSYCHIATRIC DISORDERS Anxiety     negative neurological ROS     GI/Hepatic ,GERD  Controlled,,(+)     (-) substance abuse    Endo/Other  neg diabetes    Renal/GU negative Renal ROS     Musculoskeletal  (+) Arthritis ,    Abdominal   Peds  Hematology   Anesthesia  Other Findings Past Medical History: No date: Anemia     Comment:  vitamin d deficiency No date: Anxiety No date: Breast cancer [facility])     Comment:  bilateral mastectomies.  different types of cancer in               each breast 2011: Cardiomyopathy due to chemotherapy [facility]) No date: Chronic anticoagulation     Comment:  Apixaban 2019: Chronic pain No date: Chronic SI joint pain No date: Coronary artery disease No date: DDD (degenerative disc disease), lumbar No date: First degree AV block No date: GERD (gastroesophageal reflux disease) No date: Hyperlipidemia No date: Hypertension No date: Insomnia No date: Lumbar herniated disc No date: Neuropathy No date: Paroxysmal atrial fibrillation (HCC) No date: Personal history of chemotherapy No date: Personal history of radiation therapy 08/02/2019: S/P drug eluting coronary stent placement     Comment:  2.5 x 12 mm Resolute Onyx to pLAD No date: Valvular insufficiency     Comment:  Mild  Reproductive/Obstetrics                              Anesthesia Physical Anesthesia Plan  ASA: 3  Anesthesia Plan: General   Post-op Pain Management: Ofirmev IV (intra-op)*   Induction: Intravenous  PONV Risk Score and Plan: 4 or greater and Ondansetron and Dexamethasone  Airway Management Planned: LMA  Additional Equipment: None  Intra-op Plan:   Post-operative Plan: Extubation in OR  Informed Consent: I have reviewed the patients History and Physical, chart, labs  and discussed the procedure including the risks, benefits and alternatives for the proposed anesthesia with the patient or authorized representative who has indicated his/her understanding and acceptance.     Dental advisory given  Plan Discussed with: CRNA and Surgeon  Anesthesia Plan Comments: (Discussed risks of anesthesia with patient, including PONV, sore throat, lip/dental/eye damage. Rare risks discussed as well, such as  cardiorespiratory and neurological sequelae, and allergic reactions. Discussed the role of CRNA in patient's perioperative care. Patient understands.  Patient took eliquis yesterday.)         Anesthesia Quick Evaluation

## 2023-01-08 NOTE — Progress Notes (Signed)
Initial Nutrition Assessment  DOCUMENTATION CODES:   Not applicable  INTERVENTION:   -MVI with minerals daily -Encourage adequate oral intake; encourage outside food if pt prefers -Continue with regular diet  NUTRITION DIAGNOSIS:   Increased nutrient needs related to post-op healing as evidenced by estimated needs.  GOAL:   Patient will meet greater than or equal to 90% of their needs  MONITOR:   PO intake, Supplement acceptance  REASON FOR ASSESSMENT:   Consult Assessment of nutrition requirement/status  ASSESSMENT:   Pt admitted with rt hip pain after a fall. PMH includes breast cancer, HTN, HLD, anemia, CAD, GERD, anxiety, and neuropathy.  Pt admitted with rt hip fracture.   1/19- s/p Right INTRAMEDULLARY (IM) NAIL INTERTROCHANTERIC   Reviewed I/O's: +650 ml x 24 hours  Spoke with pt at bedside, who was pleasant and in good spirits today. She reports recently returning from surgery and feels well. Pt with good appetite. She explains that she typically eats 2 meals per day (Breakfast: cereal or eggs and toast; Dinner: meat, starch, and vegetable or soup). Pt enjoys cooking and prepares all of her meals at home. Pt has not ordered lunch yet, but shares that her husband will be bringing her some chicken later.   Pt reports her UBW is around 145#. She estimates he lost about 12# over the past month, as she was recently hospitalized at [facility] for mouth cancer; she reports they removed a part of her tongue and had difficulty swallowing for a while, but this has resolved. Reviewed wt hx; pt has experienced a 12.5% wt loss over the past 9 months, which is not significant for time frame.   Discussed importance of good meal intake to promote healing. Pt politely decline offered of nutritional supplements, stating she has had them in the past but does not think she needs them now.   Medications reviewed and include colace.   Labs reviewed.   NUTRITION - FOCUSED PHYSICAL  EXAM:  Flowsheet Row Most Recent Value  Orbital Region No depletion  Upper Arm Region Moderate depletion  Thoracic and Lumbar Region No depletion  Buccal Region No depletion  Temple Region No depletion  Clavicle Bone Region Mild depletion  Clavicle and Acromion Bone Region No depletion  Scapular Bone Region No depletion  Dorsal Hand Mild depletion  Patellar Region Mild depletion  Anterior Thigh Region Mild depletion  Posterior Calf Region Mild depletion  Edema (RD Assessment) None  Hair Reviewed  Eyes Reviewed  Mouth Reviewed  Skin Reviewed  Nails Reviewed       Diet Order:   Diet Order             Diet regular Room service appropriate? Yes; Fluid consistency: Thin  Diet effective now                   EDUCATION NEEDS:   Education needs have been addressed  Skin:  Skin Assessment: Skin Integrity Issues: Skin Integrity Issues:: Incisions Incisions: closed rt hip  Last BM:  01/07/23  Height:   Ht Readings from Last 1 Encounters:  01/08/23 '5\' 3"'$  (1.6 m)    Weight:   Wt Readings from Last 1 Encounters:  01/08/23 58.1 kg    Ideal Body Weight:  52.3 kg  BMI:  Body mass index is 22.67 kg/m.  Estimated Nutritional Needs:   Kcal:  1700-1900  Protein:  85-100 grams  Fluid:  > 1.7 L    Loistine Chance, RD, LDN, [place] Registered Dietitian II Certified  Diabetes Care and Education Specialist Please refer to [facility] for RD and/or RD on-call/weekend/after hours pager

## 2023-01-08 NOTE — Progress Notes (Signed)
   01/08/23 0651  Spiritual Encounters  Type of Visit Initial  Care provided to: Pt and family  Conversation partners present during encounter Other (comment)  Referral source Patient request  Reason for visit Advance directives  OnCall Visit No  Spiritual Framework  Presenting Themes Other (comment) (Hand out paper work for Advance directive)  Community/Connection  (N/a)  [place] Outstanding Chaplain will continue to follow  Advance Directives (For Healthcare)  Does Patient Have a Medical Advance Directive? No  Would patient like information on creating a medical advance directive? No - Patient declined   Spoke to Patient hand off a Advance direct educate PT they can contact Chaplain at anytime

## 2023-01-08 NOTE — Anesthesia Procedure Notes (Signed)
Procedure Name: LMA Insertion Date/Time: 01/08/2023 7:42 AM  Performed by: Patience Musca., CRNAPre-anesthesia Checklist: Patient identified, Patient being monitored, Timeout performed, Emergency Drugs available and Suction available Patient Re-evaluated:Patient Re-evaluated prior to induction Oxygen Delivery Method: Circle system utilized Preoxygenation: Pre-oxygenation with 100% oxygen Induction Type: IV induction Ventilation: Mask ventilation without difficulty LMA: LMA inserted LMA Size: 4.0 Tube type: Oral Number of attempts: 2 (lma 3 leak to large to proceed) Placement Confirmation: positive ETCO2 and breath sounds checked- equal and bilateral Tube secured with: Tape Dental Injury: Teeth and Oropharynx as per pre-operative assessment

## 2023-01-08 NOTE — Progress Notes (Signed)
Patient adamantly refuses to get up to [facility] to urinate throughout the night. Patient states that she cannot get up through the night. Explained to patient that if she can get up to chair like she did during the day, then we could get her up to bedside commode and all she'd have to do is pivot. Tech stated that she got pt back into bed without issues from chair but pt refuses mobility now. Asks for purewick for nighttime incontinence. Patient states that she will explain this to doctor in the morning.

## 2023-01-08 NOTE — Progress Notes (Signed)
[facility] Progress Note  Patient: Kelsey Carter    FOY:[id number]  DOA: 01/07/2023     Date of Service: the patient was seen and examined on 01/08/2023  Chief Complaint  Patient presents with   Fall   Hip Pain   Brief hospital course: Kelsey Carter is an 77 y.o. female with past medical history of paroxysmal A-fib, CAD s/p PCI, HTN, history of breast cancer with chemotherapy and neuropathy, presented at [facility] ED s/p fall.  Patient stated that she missed a step and she fell, had right hip pain, denies any LOC.  Patient denies any headache or dizziness, no chest pain or palpitations. ED workup: CT head negative for any acute findings CT C-spine: Prominent degenerative changes throughout the cervical spine. Anterior subluxation at C3-4 and retrolisthesis at C5-6 are nonspecific but likely degenerative. 2. No acute displaced fractures are identified. X-ray right hip: Acute fracture of the proximal right femur.     Assessment and Plan:  Right hip fracture S/p mechanical fall Orthopedics consulted, s/p right intertrochanteric IMN done on 1/19 Continue fall precautions Continue as needed medication for pain control Follow PT and OT eval    Anemia of chronic disease and due to acute blood loss after surgery Hb 8.2-7.8 Monitor  H&H and transfuse if hemoglobin less than 7   Paroxysmal A-fib, CAD status PCI, hypertension LD History of LBBB Resumed amiodarone 20 mg p.o. daily home dose Resumed statin Held metoprolol for now due to low blood pressure Held Lasix for now, patient is euvolemic Monitor BP and titrate medications accordingly  Hypothyroidism, continue Synthroid  Prediabetic Mild hyperglycemia, continue to monitor Continue diet control     Body mass index is 22.67 kg/m.  Nutrition Problem: Increased nutrient needs Etiology: post-op healing Interventions: Interventions: MVI     Diet: Regular diet DVT Prophylaxis: SCDs for now, will resume  Eliquis when cleared by orthopedic surgery  Advance goals of care discussion: Full code  Family Communication: family was not present at bedside, at the time of interview.  The pt provided permission to discuss medical plan with the family. Opportunity was given to ask question and all questions were answered satisfactorily.   Disposition:  Pt is from Home, admitted with fall and right hip fracture, need to be seen by PT and OT for placement most likely SNF, which precludes a safe discharge. Discharge to SNF, when patient will be seen by PT and OT, TOC following for placement..  Subjective: No significant events overnight, patient was seen after surgery, stated that pain is under control, she is able to move her right foot.  Denies any other complaints.  Physical Exam: General: NAD, lying comfortably Appear in no distress, affect appropriate Eyes: PERRLA ENT: Oral Mucosa Clear, moist  Neck: no JVD,  Cardiovascular: S1 and S2 Present, no Murmur,  Respiratory: good respiratory effort, Bilateral Air entry equal and Decreased, no Crackles, no wheezes Abdomen: Bowel Sound present, Soft and no tenderness,  Skin: no rashes Extremities: no Pedal edema, no calf tenderness.  Right hip dressing CDI and other 1 has dried blood. Neurologic: without any new focal findings Gait not checked due to patient safety concerns  Vitals:   01/08/23 0845 01/08/23 0900 01/08/23 0915 01/08/23 0941  BP: 121/61 (!) 127/58 (!) 114/55 110/62  Pulse: 83 73 70 77  Resp: '16 13 12 16  '$ Temp:   97.9 F (36.6 C) 97.7 F (36.5 C)  TempSrc:      SpO2: 100% 100% 100% 99%  Weight:      Height:        Intake/Output Summary (Last 24 hours) at 01/08/2023 1516 Last data filed at 01/08/2023 1143 Gross per 24 hour  Intake 650 ml  Output --  Net 650 ml   Filed Weights   01/07/23 1704 01/08/23 0649  Weight: 58.1 kg 58.1 kg    Data Reviewed: I have personally reviewed and interpreted daily labs, tele strips,  imagings as discussed above. I reviewed all nursing notes, pharmacy notes, vitals, pertinent old records I have discussed plan of care as described above with RN and patient/family.  CBC: Recent Labs  Lab 01/07/23 1710 01/08/23 0525 01/08/23 1413  WBC 5.8 5.8  --   NEUTROABS 2.3  --   --   HGB 10.6* 8.2* 7.8*  HCT 32.0* 24.6* 23.3*  MCV 100.0 98.8  --   PLT 308 262  --    Basic Metabolic Panel: Recent Labs  Lab 01/07/23 1710 01/08/23 0523 01/08/23 0525  NA 136  --  135  K 3.3*  --  3.9  CL 100  --  103  CO2 25  --  26  GLUCOSE 161*  --  118*  BUN 17  --  18  CREATININE 1.08*  --  1.00  CALCIUM 9.1  --  8.7*  MG  --  2.0  --   PHOS  --  3.9  --     Studies: DG HIP UNILAT WITH PELVIS 2-3 VIEWS RIGHT  Result Date: 01/08/2023 CLINICAL DATA:  Right femoral nail elective surgery. Intraoperative fluoroscopy. EXAM: DG HIP (WITH OR WITHOUT PELVIS) 2-3V RIGHT COMPARISON:  Pelvis and right hip radiographs 01/07/2023 FINDINGS: Images were performed intraoperatively without the presence of a radiologist. The patient is undergoing ORIF of the previously seen right proximal femoral intertrochanteric fracture with a long cephalomedullary nail. Near anatomic alignment. No hardware complication is seen. Total fluoroscopy images: 3 Total fluoroscopy time: 34 seconds Please see intraoperative findings for further detail. IMPRESSION: Fluoroscopic assistance provided for ORIF of the right proximal femoral intertrochanteric fracture. Electronically Signed   By: Yvonne Kendall M.D.   On: 01/08/2023 08:43   DG C-Arm 1-60 Min-No Report  Result Date: 01/08/2023 Fluoroscopy was utilized by the requesting physician.  No radiographic interpretation.   DG Hip Unilat  With Pelvis 2-3 Views Right  Result Date: 01/07/2023 CLINICAL DATA:  Status post fall. EXAM: DG HIP (WITH OR WITHOUT PELVIS) 2-3V RIGHT COMPARISON:  None Available. FINDINGS: An acute, comminuted fracture deformity is seen extending  through the inter trochanteric region of the proximal right femur. There is no evidence of dislocation. Moderate severity degenerative changes are seen in the form of joint space narrowing and acetabular sclerosis. IMPRESSION: Acute fracture of the proximal right femur. Electronically Signed   By: Virgina Norfolk M.D.   On: 01/07/2023 17:52   DG Chest 1 View  Result Date: 01/07/2023 CLINICAL DATA:  Pain status post fall EXAM: CHEST  1 VIEW COMPARISON:  None Available. FINDINGS: The cardiomediastinal silhouette is unchanged in contour. Calcified atherosclerosis of the aortic arch. No focal pulmonary opacity. No pleural effusion or pneumothorax. The visualized upper abdomen is unremarkable. No acute osseous abnormality. IMPRESSION: No acute cardiopulmonary abnormality. Electronically Signed   By: Beryle Flock M.D.   On: 01/07/2023 17:51   CT Cervical Spine Wo Contrast  Result Date: 01/07/2023 CLINICAL DATA:  Neck trauma.  Fall. EXAM: CT CERVICAL SPINE WITHOUT CONTRAST TECHNIQUE: Multidetector CT imaging of the cervical spine was performed  without intravenous contrast. Multiplanar CT image reconstructions were also generated. RADIATION DOSE REDUCTION: This exam was performed according to the departmental dose-optimization program which includes automated exposure control, adjustment of the mA and/or kV according to patient size and/or use of iterative reconstruction technique. COMPARISON:  None Available. FINDINGS: Alignment: There is straightening of usual cervical lordosis. Mild anterior subluxation of C3 on C4. Mild retrolisthesis of C5 on C6. Changes are nonspecific but likely degenerative. Normal alignment of the posterior elements. Skull base and vertebrae: Skull base appears intact. No vertebral compression deformities. No focal bone lesion or bone destruction. Soft tissues and spinal canal: No prevertebral soft tissue swelling. No abnormal paraspinal soft tissue mass or infiltration. Disc levels:  Degenerative changes throughout the cervical spine with narrowed disc spaces and endplate osteophyte formation throughout. Calcification of disc material. Disc osteophyte complexes cause anterior effacement of the thecal sac at multiple levels, most prominent at C5-6. Degenerative changes in the facet joints. Hypertrophic changes involving the facet joints and uncovertebral joints causes bone encroachment on the neural foramina at multiple levels bilaterally. Upper chest: Lung apices are clear. Other: None. IMPRESSION: 1. Prominent degenerative changes throughout the cervical spine. Anterior subluxation at C3-4 and retrolisthesis at C5-6 are nonspecific but likely degenerative. 2. No acute displaced fractures are identified. Electronically Signed   By: Lucienne Capers M.D.   On: 01/07/2023 17:43   CT HEAD WO CONTRAST (5MM)  Result Date: 01/07/2023 CLINICAL DATA:  Head trauma, minor (Age >= 65y) EXAM: CT HEAD WITHOUT CONTRAST TECHNIQUE: Contiguous axial images were obtained from the base of the skull through the vertex without intravenous contrast. RADIATION DOSE REDUCTION: This exam was performed according to the departmental dose-optimization program which includes automated exposure control, adjustment of the mA and/or kV according to patient size and/or use of iterative reconstruction technique. COMPARISON:  CT head 09/09/2021 FINDINGS: Brain: Cerebral ventricle sizes are concordant with the degree of cerebral volume loss. Patchy and confluent areas of decreased attenuation are noted throughout the deep and periventricular white matter of the cerebral hemispheres bilaterally, compatible with chronic microvascular ischemic disease. No evidence of large-territorial acute infarction. No parenchymal hemorrhage. No mass lesion. No extra-axial collection. No mass effect or midline shift. No hydrocephalus. Basilar cisterns are patent. Vascular: No hyperdense vessel. Atherosclerotic calcifications are present within  the cavernous internal carotid arteries. Skull: No acute fracture or focal lesion. Sinuses/Orbits: Paranasal sinuses and mastoid air cells are clear. Bilateral lens replacement. Otherwise the orbits are unremarkable. Other: None. IMPRESSION: No acute intracranial abnormality. Electronically Signed   By: Iven Finn M.D.   On: 01/07/2023 17:41    Scheduled Meds:  atorvastatin  10 mg Oral Daily   docusate sodium  100 mg Oral BID   ketorolac  7.5 mg Intravenous Q6H   ketorolac       levothyroxine  50 mcg Oral q AM   multivitamin with minerals  1 tablet Oral Daily   Continuous Infusions:   ceFAZolin (ANCEF) IV 1 g (01/08/23 1412)   methocarbamol (ROBAXIN) IV     PRN Meds: acetaminophen, bisacodyl, bisacodyl, diphenhydrAMINE, fentaNYL (SUBLIMAZE) injection, ketorolac, magnesium citrate, methocarbamol **OR** methocarbamol (ROBAXIN) IV, metoCLOPramide **OR** metoCLOPramide (REGLAN) injection, ondansetron **OR** ondansetron (ZOFRAN) IV, polyethylene glycol, zolpidem  Time spent: 35 minutes  Author: Val Riles. MD [facility] 01/08/2023 3:16 PM  To reach On-call, see care teams to locate the attending and reach out to them via [email or web address]. If 7PM-7AM, please contact night-coverage If you still have difficulty reaching the attending provider, please  page the [facility] (Director on Call) for [facility] on amion for assistance.

## 2023-01-09 DIAGNOSIS — W19XXXA Unspecified fall, initial encounter: Secondary | ICD-10-CM | POA: Diagnosis not present

## 2023-01-09 DIAGNOSIS — I10 Essential (primary) hypertension: Secondary | ICD-10-CM | POA: Diagnosis not present

## 2023-01-09 DIAGNOSIS — Y92009 Unspecified place in unspecified non-institutional (private) residence as the place of occurrence of the external cause: Secondary | ICD-10-CM | POA: Diagnosis not present

## 2023-01-09 LAB — CBC
HCT: 20.2 % — ABNORMAL LOW (ref 36.0–46.0)
Hemoglobin: 6.6 g/dL — ABNORMAL LOW (ref 12.0–15.0)
MCH: 32.5 pg (ref 26.0–34.0)
MCHC: 32.7 g/dL (ref 30.0–36.0)
MCV: 99.5 fL (ref 80.0–100.0)
Platelets: 237 10*3/uL (ref 150–400)
RBC: 2.03 MIL/uL — ABNORMAL LOW (ref 3.87–5.11)
RDW: 14.3 % (ref 11.5–15.5)
WBC: 5.3 10*3/uL (ref 4.0–10.5)
nRBC: 0.6 % — ABNORMAL HIGH (ref 0.0–0.2)

## 2023-01-09 LAB — ABO/RH: ABO/RH(D): O POS

## 2023-01-09 LAB — PREPARE RBC (CROSSMATCH)

## 2023-01-09 LAB — BASIC METABOLIC PANEL
Anion gap: 8 (ref 5–15)
BUN: 28 mg/dL — ABNORMAL HIGH (ref 8–23)
CO2: 25 mmol/L (ref 22–32)
Calcium: 9.1 mg/dL (ref 8.9–10.3)
Chloride: 101 mmol/L (ref 98–111)
Creatinine, Ser: 1.26 mg/dL — ABNORMAL HIGH (ref 0.44–1.00)
GFR, Estimated: 44 mL/min — ABNORMAL LOW (ref >60)
Glucose, Bld: 138 mg/dL — ABNORMAL HIGH (ref 70–99)
Potassium: 5 mmol/L (ref 3.5–5.1)
Sodium: 134 mmol/L — ABNORMAL LOW (ref 135–145)

## 2023-01-09 LAB — MAGNESIUM: Magnesium: 2.2 mg/dL (ref 1.7–2.4)

## 2023-01-09 LAB — PHOSPHORUS: Phosphorus: 4.7 mg/dL — ABNORMAL HIGH (ref 2.5–4.6)

## 2023-01-09 MED ORDER — SODIUM CHLORIDE 0.9% IV SOLUTION: Freq: Once | INTRAVENOUS | Status: DC

## 2023-01-09 NOTE — Progress Notes (Signed)
Subjective:  Patient reports pain as mild.    Objective:   VITALS:   Vitals:   01/08/23 2019 01/08/23 2352 01/09/23 0458 01/09/23 0839  BP: 104/71 (!) 97/57 112/63 123/69  Pulse: 74 65 68 74  Resp: '20 20 20 16  '$ Temp: (!) 97.5 F (36.4 C) 97.6 F (36.4 C) 98.4 F (36.9 C) 97.8 F (36.6 C)  TempSrc:      SpO2: 98% 97% 98% 92%  Weight:      Height:        PHYSICAL EXAM:  ABD soft Sensation intact distally Dorsiflexion/Plantar flexion intact Incision: moderate drainage No cellulitis present Compartment soft  LABS  Results for orders placed or performed during the hospital encounter of 01/07/23 (from the past 24 hour(s))  Hemoglobin and hematocrit, blood     Status: Abnormal   Collection Time: 01/08/23  2:13 PM  Result Value Ref Range   Hemoglobin 7.8 (L) 12.0 - 15.0 g/dL   HCT 23.3 (L) 36.0 - 46.0 %  VITAMIN D 25 Hydroxy (Vit-D Deficiency, Fractures)     Status: None   Collection Time: 01/08/23  2:13 PM  Result Value Ref Range   Vit D, 25-Hydroxy 45.53 30 - 100 ng/mL  ABO/Rh     Status: None   Collection Time: 01/09/23  5:12 AM  Result Value Ref Range   ABO/RH(D)      O POS Performed at [facility], [address] [place], [place] [address]   Basic metabolic panel     Status: Abnormal   Collection Time: 01/09/23  5:15 AM  Result Value Ref Range   Sodium 134 (L) 135 - 145 mmol/L   Potassium 5.0 3.5 - 5.1 mmol/L   Chloride 101 98 - 111 mmol/L   CO2 25 22 - 32 mmol/L   Glucose, Bld 138 (H) 70 - 99 mg/dL   BUN 28 (H) 8 - 23 mg/dL   Creatinine, Ser 1.26 (H) 0.44 - 1.00 mg/dL   Calcium 9.1 8.9 - 10.3 mg/dL   GFR, Estimated 44 (L) >60 mL/min   Anion gap 8 5 - 15  CBC     Status: Abnormal   Collection Time: 01/09/23  5:15 AM  Result Value Ref Range   WBC 5.3 4.0 - 10.5 K/uL   RBC 2.03 (L) 3.87 - 5.11 MIL/uL   Hemoglobin 6.6 (L) 12.0 - 15.0 g/dL   HCT 20.2 (L) 36.0 - 46.0 %   MCV 99.5 80.0 - 100.0 fL   MCH 32.5 26.0 - 34.0 pg   MCHC 32.7 30.0  - 36.0 g/dL   RDW 14.3 11.5 - 15.5 %   Platelets 237 150 - 400 K/uL   nRBC 0.6 (H) 0.0 - 0.2 %  Magnesium     Status: None   Collection Time: 01/09/23  5:15 AM  Result Value Ref Range   Magnesium 2.2 1.7 - 2.4 mg/dL  Phosphorus     Status: Abnormal   Collection Time: 01/09/23  5:15 AM  Result Value Ref Range   Phosphorus 4.7 (H) 2.5 - 4.6 mg/dL  Prepare RBC (crossmatch)     Status: None   Collection Time: 01/09/23  7:50 AM  Result Value Ref Range   Order Confirmation      ORDER PROCESSED BY BLOOD BANK Performed at [facility], [address] [place], [place] [address]     DG HIP UNILAT WITH PELVIS 2-3 VIEWS RIGHT  Result Date: 01/08/2023 CLINICAL DATA:  Right femoral nail elective  surgery. Intraoperative fluoroscopy. EXAM: DG HIP (WITH OR WITHOUT PELVIS) 2-3V RIGHT COMPARISON:  Pelvis and right hip radiographs 01/07/2023 FINDINGS: Images were performed intraoperatively without the presence of a radiologist. The patient is undergoing ORIF of the previously seen right proximal femoral intertrochanteric fracture with a long cephalomedullary nail. Near anatomic alignment. No hardware complication is seen. Total fluoroscopy images: 3 Total fluoroscopy time: 34 seconds Please see intraoperative findings for further detail. IMPRESSION: Fluoroscopic assistance provided for ORIF of the right proximal femoral intertrochanteric fracture. Electronically Signed   By: Yvonne Kendall M.D.   On: 01/08/2023 08:43   DG C-Arm 1-60 Min-No Report  Result Date: 01/08/2023 Fluoroscopy was utilized by the requesting physician.  No radiographic interpretation.   DG Hip Unilat  With Pelvis 2-3 Views Right  Result Date: 01/07/2023 CLINICAL DATA:  Status post fall. EXAM: DG HIP (WITH OR WITHOUT PELVIS) 2-3V RIGHT COMPARISON:  None Available. FINDINGS: An acute, comminuted fracture deformity is seen extending through the inter trochanteric region of the proximal right femur. There is no evidence of  dislocation. Moderate severity degenerative changes are seen in the form of joint space narrowing and acetabular sclerosis. IMPRESSION: Acute fracture of the proximal right femur. Electronically Signed   By: Virgina Norfolk M.D.   On: 01/07/2023 17:52   DG Chest 1 View  Result Date: 01/07/2023 CLINICAL DATA:  Pain status post fall EXAM: CHEST  1 VIEW COMPARISON:  None Available. FINDINGS: The cardiomediastinal silhouette is unchanged in contour. Calcified atherosclerosis of the aortic arch. No focal pulmonary opacity. No pleural effusion or pneumothorax. The visualized upper abdomen is unremarkable. No acute osseous abnormality. IMPRESSION: No acute cardiopulmonary abnormality. Electronically Signed   By: Beryle Flock M.D.   On: 01/07/2023 17:51   CT Cervical Spine Wo Contrast  Result Date: 01/07/2023 CLINICAL DATA:  Neck trauma.  Fall. EXAM: CT CERVICAL SPINE WITHOUT CONTRAST TECHNIQUE: Multidetector CT imaging of the cervical spine was performed without intravenous contrast. Multiplanar CT image reconstructions were also generated. RADIATION DOSE REDUCTION: This exam was performed according to the departmental dose-optimization program which includes automated exposure control, adjustment of the mA and/or kV according to patient size and/or use of iterative reconstruction technique. COMPARISON:  None Available. FINDINGS: Alignment: There is straightening of usual cervical lordosis. Mild anterior subluxation of C3 on C4. Mild retrolisthesis of C5 on C6. Changes are nonspecific but likely degenerative. Normal alignment of the posterior elements. Skull base and vertebrae: Skull base appears intact. No vertebral compression deformities. No focal bone lesion or bone destruction. Soft tissues and spinal canal: No prevertebral soft tissue swelling. No abnormal paraspinal soft tissue mass or infiltration. Disc levels: Degenerative changes throughout the cervical spine with narrowed disc spaces and endplate  osteophyte formation throughout. Calcification of disc material. Disc osteophyte complexes cause anterior effacement of the thecal sac at multiple levels, most prominent at C5-6. Degenerative changes in the facet joints. Hypertrophic changes involving the facet joints and uncovertebral joints causes bone encroachment on the neural foramina at multiple levels bilaterally. Upper chest: Lung apices are clear. Other: None. IMPRESSION: 1. Prominent degenerative changes throughout the cervical spine. Anterior subluxation at C3-4 and retrolisthesis at C5-6 are nonspecific but likely degenerative. 2. No acute displaced fractures are identified. Electronically Signed   By: Lucienne Capers M.D.   On: 01/07/2023 17:43   CT HEAD WO CONTRAST (5MM)  Result Date: 01/07/2023 CLINICAL DATA:  Head trauma, minor (Age >= 65y) EXAM: CT HEAD WITHOUT CONTRAST TECHNIQUE: Contiguous axial images were obtained from  the base of the skull through the vertex without intravenous contrast. RADIATION DOSE REDUCTION: This exam was performed according to the departmental dose-optimization program which includes automated exposure control, adjustment of the mA and/or kV according to patient size and/or use of iterative reconstruction technique. COMPARISON:  CT head 09/09/2021 FINDINGS: Brain: Cerebral ventricle sizes are concordant with the degree of cerebral volume loss. Patchy and confluent areas of decreased attenuation are noted throughout the deep and periventricular white matter of the cerebral hemispheres bilaterally, compatible with chronic microvascular ischemic disease. No evidence of large-territorial acute infarction. No parenchymal hemorrhage. No mass lesion. No extra-axial collection. No mass effect or midline shift. No hydrocephalus. Basilar cisterns are patent. Vascular: No hyperdense vessel. Atherosclerotic calcifications are present within the cavernous internal carotid arteries. Skull: No acute fracture or focal lesion.  Sinuses/Orbits: Paranasal sinuses and mastoid air cells are clear. Bilateral lens replacement. Otherwise the orbits are unremarkable. Other: None. IMPRESSION: No acute intracranial abnormality. Electronically Signed   By: Iven Finn M.D.   On: 01/07/2023 17:41    Assessment/Plan: 1 Day Post-Op   Principal Problem:   Fall at home, initial encounter Active Problems:   Benign essential HTN   Coronary artery disease involving native coronary artery of native heart   Prediabetes   LBBB (left bundle branch block)   Paroxysmal atrial fibrillation (HCC)   Anemia   Closed right hip fracture, initial encounter ([place])   Advance diet Up with therapy Plan for discharge tomorrow if clears PT and stable from medical service Dressing changed   Lovell Sheehan , MD 01/09/2023, 10:23 AM

## 2023-01-09 NOTE — TOC Progression Note (Addendum)
Transition of Care [facility] - Progression Note    Patient Details  Name: Kelsey Carter MRN: [id number] Date of Birth: 02-05-1946  Transition of Care [facility] CM/SW Contact  Izola Price, RN Phone Number: 01/09/2023, 2:42 PM  Clinical Narrative:  01/09/2023: Damaris Schooner with patient regarding [place] on discharge and patient is eager and agreeable. Family setting up ramp and safety equipment/grab bars at home. Patient has not urinated adequately yet as barrier to discharge along with possibly getting PRBC's. No preference on [facility] agency, has not had any HH before this. Needs a RW, does not have one, will order from [employer]. EDD likely Sunday or Monday. Contacted [facility] and left VM and secure text with Meg W and call back number as she indicated earlier today she has PT available for [facility] with [employer] insurance. [employer] to deliver RW to room Sunday, 01/10/23 per Mardene Celeste.  Barbie Paolo Okane RN CM    250 pm Requested HH and ME orders from provider. [facility] can accept for PT per Meg.W. Simmie Davies RN CM       Expected Discharge Plan and Services                                               Social Determinants of Health (SDOH) Interventions SDOH Screenings   Food Insecurity: No Food Insecurity (01/07/2023)  Housing: Low Risk  (01/07/2023)  Transportation Needs: No Transportation Needs (01/07/2023)  Utilities: Not At Risk (01/07/2023)  Depression (PHQ2-9): Low Risk  (04/14/2022)  Financial Resource Strain: Low Risk  (03/31/2018)  Physical Activity: Inactive (03/31/2018)  Social Connections: Unknown (03/31/2018)  Stress: No Stress Concern Present (03/31/2018)  Tobacco Use: Medium Risk (01/08/2023)    Readmission Risk Interventions     No data to display

## 2023-01-09 NOTE — Evaluation (Signed)
Physical Therapy Evaluation Patient Details Name: Kelsey Carter MRN: [id number] DOB: 1946-02-23 Today's Date: 01/09/2023  History of Present Illness  Pt admitted for Fall at home resulting in hip fracture. Pt is s/p R hip IM nailing on 01/08/23. History includes Afib, cardiomyopathy, HTN, and GERD.  Clinical Impression  Pt is a pleasant 77 year old female who was admitted for R hip IM nailing on 01/08/23. Pt performs bed mobility/transfers with min assist and ambulation with cga and RW. Pt demonstrates deficits with strength/mobility/pain. Pt very motivated to participate and hopeful for home discharge. Would benefit from skilled PT to address above deficits and promote optimal return to PLOF. Recommend transition to [place] upon discharge from acute hospitalization.       Recommendations for follow up therapy are one component of a multi-disciplinary discharge planning process, led by the attending physician.  Recommendations may be updated based on patient status, additional functional criteria and insurance authorization.  Follow Up Recommendations Home health PT      Assistance Recommended at Discharge Intermittent Supervision/Assistance  Patient can return home with the following  A little help with walking and/or transfers;A little help with bathing/dressing/bathroom;Assist for transportation    Equipment Recommendations Rolling walker (2 wheels)  Recommendations for Other Services       Functional Status Assessment Patient has had a recent decline in their functional status and demonstrates the ability to make significant improvements in function in a reasonable and predictable amount of time.     Precautions / Restrictions Precautions Precautions: Fall Restrictions Weight Bearing Restrictions: Yes RLE Weight Bearing: Weight bearing as tolerated      Mobility  Bed Mobility Overal bed mobility: Needs Assistance Bed Mobility: Supine to Sit     Supine to sit: Min  assist     General bed mobility comments: safe technique with ease of mobility. Once seated at EOB, upright posture noted. FOllows commands well    Transfers Overall transfer level: Needs assistance Equipment used: Rolling walker (2 wheels) Transfers: Sit to/from Stand Sit to Stand: Min assist           General transfer comment: safe technique with upright posture. Cues for hand placement for safety. ONce standing, no issues with dizziness note.    Ambulation/Gait Ambulation/Gait assistance: Min guard Gait Distance (Feet): 80 Feet Assistive device: Rolling walker (2 wheels) Gait Pattern/deviations: Step-to pattern       General Gait Details: partial step through, however inconsistent. Becomes weak during ambulation with seated rest break required. Pt then able to complete further distance. RW used with safe technique  Stairs            Wheelchair Mobility    Modified Rankin (Stroke Patients Only)       Balance Overall balance assessment: Needs assistance Sitting-balance support: Feet supported Sitting balance-Leahy Scale: Good     Standing balance support: Bilateral upper extremity supported Standing balance-Leahy Scale: Fair                               Pertinent Vitals/Pain Pain Assessment Pain Assessment: 0-10 Pain Score: 2  Pain Location: R hip Pain Descriptors / Indicators: Operative site guarding Pain Intervention(s): Limited activity within patient's tolerance, Repositioned    Home Living Family/patient expects to be discharged to:: Private residence Living Arrangements: Spouse/significant other Available Help at Discharge: Family Type of Home: House Home Access: Ramped entrance       Home Layout: One level Home  Equipment: BSC/3in1      Prior Function Prior Level of Function : Independent/Modified Independent             Mobility Comments: independent, was previously going to the gym regulary. Reports multiple falls  secondary to peripheral neuropathy ADLs Comments: indep     Hand Dominance        Extremity/Trunk Assessment   Upper Extremity Assessment Upper Extremity Assessment: Overall WFL for tasks assessed    Lower Extremity Assessment Lower Extremity Assessment: Generalized weakness (R LE grossly 3+/5; L LE grossly 4/5)       Communication   Communication: No difficulties  Cognition Arousal/Alertness: Awake/alert Behavior During Therapy: WFL for tasks assessed/performed Overall Cognitive Status: Within Functional Limits for tasks assessed                                          General Comments      Exercises Other Exercises Other Exercises: supine ther-ex performed on R LE including hip add squeezes, AP, QS, glut sets, and hip abd/add. 10 reps with cga   Assessment/Plan    PT Assessment Patient needs continued PT services  PT Problem List Decreased strength;Decreased balance;Decreased mobility;Pain       PT Treatment Interventions DME instruction;Gait training;Therapeutic activities;Therapeutic exercise    PT Goals (Current goals can be found in the Care Plan section)  Acute Rehab PT Goals Patient Stated Goal: to go home PT Goal Formulation: With patient Time For Goal Achievement: 01/23/23 Potential to Achieve Goals: Good    Frequency BID     Co-evaluation               AM-PAC PT "6 Clicks" Mobility  Outcome Measure Help needed turning from your back to your side while in a flat bed without using bedrails?: A Little Help needed moving from lying on your back to sitting on the side of a flat bed without using bedrails?: A Little Help needed moving to and from a bed to a chair (including a wheelchair)?: A Little Help needed standing up from a chair using your arms (e.g., wheelchair or bedside chair)?: A Little Help needed to walk in hospital room?: A Little Help needed climbing 3-5 steps with a railing? : A Lot 6 Click Score: 17    End  of Session Equipment Utilized During Treatment: Gait belt Activity Tolerance: Patient tolerated treatment well Patient left: in chair;with chair alarm set;with family/visitor present Nurse Communication: Mobility status PT Visit Diagnosis: Unsteadiness on feet (R26.81);Repeated falls (R29.6);Muscle weakness (generalized) (M62.81);History of falling (Z91.81);Difficulty in walking, not elsewhere classified (R26.2);Pain Pain - Right/Left: Right Pain - part of body: Hip    Time: 3570-1779 PT Time Calculation (min) (ACUTE ONLY): 32 min   Charges:   PT Evaluation $PT Eval Low Complexity: 1 Low PT Treatments $Therapeutic Exercise: 8-22 mins        Greggory Stallion, PT, DPT, GCS [phone]   Zorianna Taliaferro 01/09/2023, 11:56 AM

## 2023-01-09 NOTE — Progress Notes (Signed)
Dr. Dwyane Dee requested patient to get transfusion. Asked patient again along with Helene Kelp (Camera operator). She refused, stated she doesn't want to receive blood transfusion. Dr. Dwyane Dee aware.

## 2023-01-09 NOTE — Progress Notes (Addendum)
Patient refused to consent for blood transfusion. Dr. Harlow Mares was doing her dressing changed when I told her about the transfusion. Per Dr. Blenda Nicely, he is okay for her not getting a transfusion. Dr. Dwyane Dee notified.

## 2023-01-09 NOTE — Progress Notes (Signed)
Patient is alert and oriented x4. Very resisting to mobility. Patient had not urinated all night so got patient up to [facility] with encouragement to urinate. Patient has some concerns at to why her lasix and fosamax was discontinued-will inform day team. Gave scheduled pain meds. Patient slept through the entire night. Denied additional needs.

## 2023-01-09 NOTE — Evaluation (Signed)
Occupational Therapy Evaluation Patient Details Name: Kelsey Carter MRN: [id number] DOB: 05/10/1946 Today's Date: 01/09/2023   History of Present Illness Pt admitted for Fall at home resulting in hip fracture. Pt is s/p R hip IM nailing on 01/08/23. History includes Afib, cardiomyopathy, HTN, and GERD.   Clinical Impression   Patient seen for OT evaluation, son and spouse present. Pt presenting with decreased independence in self care, balance, functional mobility/transfers, endurance. PTA pt lived with spouse and was independent for ADLs. Pt currently functioning at [employer] guard for functional mobility to the bathroom using RW, Min A for toilet transfer, and set up-supervision for seated grooming tasks. Pt limited by fatigue this date and would benefit from further education in energy conservation techniques for improved activity tolerance in ADL/IADL tasks. Pt will benefit from acute OT to increase overall independence in the areas of ADLs and functional mobility in order to safely discharge home. Pt could benefit from [facility] following D/C to decrease falls risk, improve balance, and maximize independence in self-care within own home environment.    Recommendations for follow up therapy are one component of a multi-disciplinary discharge planning process, led by the attending physician.  Recommendations may be updated based on patient status, additional functional criteria and insurance authorization.   Follow Up Recommendations  Home health OT     Assistance Recommended at Discharge Intermittent Supervision/Assistance  Patient can return home with the following A little help with walking and/or transfers;A little help with bathing/dressing/bathroom;Assistance with cooking/housework;Assist for transportation;Help with stairs or ramp for entrance    Functional Status Assessment  Patient has had a recent decline in their functional status and demonstrates the ability to make significant  improvements in function in a reasonable and predictable amount of time.  Equipment Recommendations  None recommended by OT    Recommendations for Other Services       Precautions / Restrictions Precautions Precautions: Fall Restrictions Weight Bearing Restrictions: Yes RLE Weight Bearing: Weight bearing as tolerated      Mobility Bed Mobility Overal bed mobility: Needs Assistance Bed Mobility: Sit to Supine       Sit to supine: Min guard   General bed mobility comments: increased time required, able to bring BLEs back onto bed without physical assistance    Transfers Overall transfer level: Needs assistance Equipment used: Rolling walker (2 wheels) Transfers: Sit to/from Stand Sit to Stand: Min assist                  Balance Overall balance assessment: Needs assistance Sitting-balance support: Feet supported Sitting balance-Leahy Scale: Good     Standing balance support: Bilateral upper extremity supported Standing balance-Leahy Scale: Fair                             ADL either performed or assessed with clinical judgement   ADL Overall ADL's : Needs assistance/impaired     Grooming: Set up;Sitting;Wash/dry face                   Toilet Transfer: Regular Toilet;Grab bars;Rolling walker (2 wheels);Minimal assistance Toilet Transfer Details (indicate cue type and reason): 2/2 lower transfer surface Toileting- Clothing Manipulation and Hygiene: Min guard;Sit to/from stand Toileting - Clothing Manipulation Details (indicate cue type and reason): anticipate     Functional mobility during ADLs: Min guard;Rolling walker (2 wheels) (Min guard for safety 2/2 reports of fatigue, to the bathroom ~25 ft)  Vision Baseline Vision/History: 1 Wears glasses (reading) Patient Visual Report: No change from baseline       Perception     Praxis      Pertinent Vitals/Pain Pain Assessment Pain Assessment: 0-10 Pain Score: 2  Pain  Location: R hip Pain Descriptors / Indicators: Operative site guarding Pain Intervention(s): Limited activity within patient's tolerance, Repositioned     Hand Dominance     Extremity/Trunk Assessment Upper Extremity Assessment Upper Extremity Assessment: Overall WFL for tasks assessed   Lower Extremity Assessment Lower Extremity Assessment: Generalized weakness (hx of peripheral neuropathy)       Communication Communication Communication: No difficulties   Cognition Arousal/Alertness: Awake/alert Behavior During Therapy: WFL for tasks assessed/performed Overall Cognitive Status: Within Functional Limits for tasks assessed                                       General Comments  no c/o dizziness with mobility    Exercises Other Exercises Other Exercises: OT provided education re: role of OT, OT POC, post acute recs, sitting up for all meals, EOB/OOB mobility with assistance, home/fall safety, briefly discussed energy conservation   Shoulder Instructions      Home Living Family/patient expects to be discharged to:: Private residence Living Arrangements: Spouse/significant other Available Help at Discharge: Family;Available 24 hours/day Type of Home: House Home Access: Ramped entrance     Home Layout: One level     Bathroom Shower/Tub: Teacher, early years/pre: Handicapped height     Home Equipment: BSC/3in1          Prior Functioning/Environment Prior Level of Function : Independent/Modified Independent             Mobility Comments: independent, was previously going to the gym regulary. Reports multiple falls secondary to peripheral neuropathy ADLs Comments: Independent for ADLs, Mod I IADLs (shared responsibility with husband for household management tasks, has hired cleaners). Husband provides transportation.        OT Problem List: Decreased strength;Decreased activity tolerance;Impaired balance (sitting and/or  standing);Pain      OT Treatment/Interventions: Self-care/ADL training;Therapeutic exercise;Therapeutic activities;Energy conservation;DME and/or AE instruction;Patient/family education;Balance training    OT Goals(Current goals can be found in the care plan section) Acute Rehab OT Goals Patient Stated Goal: return home OT Goal Formulation: With patient/family Time For Goal Achievement: 01/23/23 Potential to Achieve Goals: Good   OT Frequency: Min 2X/week    Co-evaluation              AM-PAC OT "6 Clicks" Daily Activity     Outcome Measure Help from another person eating meals?: None Help from another person taking care of personal grooming?: A Little Help from another person toileting, which includes using toliet, bedpan, or urinal?: A Little Help from another person bathing (including washing, rinsing, drying)?: A Little Help from another person to put on and taking off regular upper body clothing?: None Help from another person to put on and taking off regular lower body clothing?: A Little 6 Click Score: 20   End of Session Equipment Utilized During Treatment: Rolling walker (2 wheels);Gait belt Nurse Communication: Mobility status  Activity Tolerance: Patient tolerated treatment well;Patient limited by fatigue Patient left: in bed;with call bell/phone within reach;with bed alarm set  OT Visit Diagnosis: Unsteadiness on feet (R26.81);Muscle weakness (generalized) (M62.81);History of falling (Z91.81);Pain Pain - Right/Left: Right Pain - part of body: Hip  Time: 7741-4239 OT Time Calculation (min): 27 min Charges:  OT General Charges $OT Visit: 1 Visit OT Evaluation $OT Eval Low Complexity: 1 Low  [facility] MS, OTR/L ascom [phone]  01/09/23, 2:51 PM

## 2023-01-09 NOTE — Progress Notes (Addendum)
[facility] Progress Note  Patient: Kelsey Carter    QVZ:[id number]  DOA: 01/07/2023     Date of Service: the patient was seen and examined on 01/09/2023  Chief Complaint  Patient presents with   Fall   Hip Pain   Brief hospital course: Kelsey ENRIQUES is an 77 y.o. female with past medical history of paroxysmal A-fib, CAD s/p PCI, HTN, history of breast cancer with chemotherapy and neuropathy, presented at [facility] ED s/p fall.  Patient stated that she missed a step and she fell, had right hip pain, denies any LOC.  Patient denies any headache or dizziness, no chest pain or palpitations. ED workup: CT head negative for any acute findings CT C-spine: Prominent degenerative changes throughout the cervical spine. Anterior subluxation at C3-4 and retrolisthesis at C5-6 are nonspecific but likely degenerative. 2. No acute displaced fractures are identified. X-ray right hip: Acute fracture of the proximal right femur.     Assessment and Plan:  Right hip fracture S/p mechanical fall Orthopedics consulted, s/p right intertrochanteric IMN done on 1/19 Continue fall precautions Continue as needed medication for pain control Follow PT and OT eval    Anemia of chronic disease and due to acute blood loss after surgery Hb 8.2-7.8--6.6 1/20 1 unit of PRBC transfusion ordered, but patient is refusing, she verbalized risk of not getting blood transfusion. Monitor  H&H and transfuse if hemoglobin less than 7   Paroxysmal A-fib, CAD status PCI, hypertension LD History of LBBB Resumed amiodarone 200 mg p.o. daily home dose Resumed statin Held metoprolol for now due to low blood pressure Held Lasix for now, patient is euvolemic Hold Eliquis for now as hemoglobin dropped, resume Eliquis when H&H stable and cleared by orthopedic surgery. Monitor BP and titrate medications accordingly  Hypothyroidism, continue Synthroid  Prediabetic Mild hyperglycemia, continue to monitor Continue  diet control   Body mass index is 22.67 kg/m.  Nutrition Problem: Increased nutrient needs Etiology: post-op healing Interventions: Interventions: MVI     Diet: Regular diet DVT Prophylaxis: SCDs for now, will resume Eliquis when cleared by orthopedic surgery  Advance goals of care discussion: Full code  Family Communication: family was not present at bedside, at the time of interview.  The pt provided permission to discuss medical plan with the family. Opportunity was given to ask question and all questions were answered satisfactorily.   Disposition:  Pt is from Home, admitted with fall and right hip fracture, developed anemia, hemoglobin dropped, receiving PRBC transfusion today.  PT and OT recommended home health PT which will be arranged.   Discharge to Home health PT when clinically stable, may be tomorrow if H&H remained stable.    Subjective: No significant events overnight, patient's pain under control, patient is more worried about her Lasix, seems to be euvolemic, creatinine slightly elevated so we will hold off Lasix for now, patient was informed.  Patient is reluctant to get the PRBC transfusion, counseling done, patient agreed to get the transfusion. Patient would like to go home, we will arrange home health PT on discharge.   Physical Exam: General: NAD, lying comfortably Appear in no distress, affect appropriate Eyes: PERRLA ENT: Oral Mucosa Clear, moist  Neck: no JVD,  Cardiovascular: S1 and S2 Present, no Murmur,  Respiratory: good respiratory effort, Bilateral Air entry equal and Decreased, no Crackles, no wheezes Abdomen: Bowel Sound present, Soft and no tenderness,  Skin: no rashes Extremities: no Pedal edema, no calf tenderness.  Right hip dressing CDI and  other 1 has dried blood. Neurologic: without any new focal findings Gait not checked due to patient safety concerns  Vitals:   01/08/23 2019 01/08/23 2352 01/09/23 0458 01/09/23 0839  BP: 104/71 (!)  97/57 112/63 123/69  Pulse: 74 65 68 74  Resp: '20 20 20 16  '$ Temp: (!) 97.5 F (36.4 C) 97.6 F (36.4 C) 98.4 F (36.9 C) 97.8 F (36.6 C)  TempSrc:      SpO2: 98% 97% 98% 92%  Weight:      Height:        Intake/Output Summary (Last 24 hours) at 01/09/2023 1417 Last data filed at 01/09/2023 8453 Gross per 24 hour  Intake 120 ml  Output 300 ml  Net -180 ml   Filed Weights   01/07/23 1704 01/08/23 0649  Weight: 58.1 kg 58.1 kg    Data Reviewed: I have personally reviewed and interpreted daily labs, tele strips, imagings as discussed above. I reviewed all nursing notes, pharmacy notes, vitals, pertinent old records I have discussed plan of care as described above with RN and patient/family.  CBC: Recent Labs  Lab 01/07/23 1710 01/08/23 0525 01/08/23 1413 01/09/23 0515  WBC 5.8 5.8  --  5.3  NEUTROABS 2.3  --   --   --   HGB 10.6* 8.2* 7.8* 6.6*  HCT 32.0* 24.6* 23.3* 20.2*  MCV 100.0 98.8  --  99.5  PLT 308 262  --  646   Basic Metabolic Panel: Recent Labs  Lab 01/07/23 1710 01/08/23 0523 01/08/23 0525 01/09/23 0515  NA 136  --  135 134*  K 3.3*  --  3.9 5.0  CL 100  --  103 101  CO2 25  --  26 25  GLUCOSE 161*  --  118* 138*  BUN 17  --  18 28*  CREATININE 1.08*  --  1.00 1.26*  CALCIUM 9.1  --  8.7* 9.1  MG  --  2.0  --  2.2  PHOS  --  3.9  --  4.7*    Studies: No results found.  Scheduled Meds:  sodium chloride   Intravenous Once   amiodarone  200 mg Oral Daily   atorvastatin  10 mg Oral Daily   docusate sodium  100 mg Oral BID   levothyroxine  50 mcg Oral q AM   multivitamin with minerals  1 tablet Oral Daily   Continuous Infusions:  methocarbamol (ROBAXIN) IV     PRN Meds: acetaminophen, bisacodyl, bisacodyl, diphenhydrAMINE, fentaNYL (SUBLIMAZE) injection, magnesium citrate, methocarbamol **OR** methocarbamol (ROBAXIN) IV, metoCLOPramide **OR** metoCLOPramide (REGLAN) injection, ondansetron **OR** ondansetron (ZOFRAN) IV, polyethylene  glycol, zolpidem  Time spent: 50 minutes  Author: Val Riles. MD [facility] 01/09/2023 2:17 PM  To reach On-call, see care teams to locate the attending and reach out to them via [email or web address]. If 7PM-7AM, please contact night-coverage If you still have difficulty reaching the attending provider, please page the [facility] (Director on Call) for [facility] on amion for assistance.

## 2023-01-09 NOTE — Progress Notes (Signed)
Physical Therapy Treatment Patient Details Name: Kelsey Carter MRN: [id number] DOB: Jan 26, 1946 Today's Date: 01/09/2023   History of Present Illness Pt admitted for Fall at home resulting in hip fracture. Pt is s/p R hip IM nailing on 01/08/23. History includes Afib, cardiomyopathy, HTN, and GERD.    PT Comments    Pt is making good progress towards goals with ability to ambulate in room distances using RW. Pt able to urinate in bathroom with safe technique regarding transfers. HEP reviewed and performed. Still fatigues with exertion, continues to be motivated to participate. No dizziness noted. Will continue to progress.   Recommendations for follow up therapy are one component of a multi-disciplinary discharge planning process, led by the attending physician.  Recommendations may be updated based on patient status, additional functional criteria and insurance authorization.  Follow Up Recommendations  Home health PT     Assistance Recommended at Discharge Intermittent Supervision/Assistance  Patient can return home with the following A little help with walking and/or transfers;A little help with bathing/dressing/bathroom;Assist for transportation   Equipment Recommendations  Rolling walker (2 wheels)    Recommendations for Other Services       Precautions / Restrictions Precautions Precautions: Fall Restrictions Weight Bearing Restrictions: Yes RLE Weight Bearing: Weight bearing as tolerated     Mobility  Bed Mobility Overal bed mobility: Needs Assistance Bed Mobility: Sit to Supine, Supine to Sit     Supine to sit: Min assist Sit to supine: Min guard   General bed mobility comments: safe technique with ability to initiate movement over towards side of bed. Once seated, upright posture    Transfers Overall transfer level: Needs assistance Equipment used: Rolling walker (2 wheels) Transfers: Sit to/from Stand Sit to Stand: Min assist           General  transfer comment: still requires cues for upright posture and safe hand placement. RW used    Ambulation/Gait Ambulation/Gait assistance: Counsellor (Feet): 50 Feet Assistive device: Rolling walker (2 wheels) Gait Pattern/deviations: Step-through pattern       General Gait Details: ambulated to door and back with improved reciprocal gait pattern. RW used. Quickly fatigues.   Stairs             Wheelchair Mobility    Modified Rankin (Stroke Patients Only)       Balance Overall balance assessment: Needs assistance Sitting-balance support: Feet supported Sitting balance-Leahy Scale: Good     Standing balance support: Bilateral upper extremity supported Standing balance-Leahy Scale: Fair                              Cognition Arousal/Alertness: Awake/alert Behavior During Therapy: WFL for tasks assessed/performed Overall Cognitive Status: Within Functional Limits for tasks assessed                                          Exercises Other Exercises Other Exercises: supine ther-ex performed on R LE including hip add squeezes, AP, QS, glut sets, and hip abd/add. 10 reps with cga Other Exercises: ambulated to bathroom, able to perform transfers with safe technique and hygiene. Other Exercises: supine ther-ex performed on R LE including Glut sets, hip abd/add, SAQ, and heel slides. 10 reps performed    General Comments General comments (skin integrity, edema, etc.): no c/o dizziness with mobility  Pertinent Vitals/Pain Pain Assessment Pain Assessment: 0-10 Pain Score: 2  Pain Location: R hip Pain Descriptors / Indicators: Operative site guarding Pain Intervention(s): Limited activity within patient's tolerance, Ice applied    Home Living Family/patient expects to be discharged to:: Private residence Living Arrangements: Spouse/significant other Available Help at Discharge: Family;Available 24 hours/day Type of  Home: House Home Access: Ramped entrance       Home Layout: One level Home Equipment: BSC/3in1      Prior Function            PT Goals (current goals can now be found in the care plan section) Acute Rehab PT Goals Patient Stated Goal: to go home PT Goal Formulation: With patient Time For Goal Achievement: 01/23/23 Potential to Achieve Goals: Good Progress towards PT goals: Progressing toward goals    Frequency    BID      PT Plan Current plan remains appropriate    Co-evaluation              AM-PAC PT "6 Clicks" Mobility   Outcome Measure  Help needed turning from your back to your side while in a flat bed without using bedrails?: A Little Help needed moving from lying on your back to sitting on the side of a flat bed without using bedrails?: A Little Help needed moving to and from a bed to a chair (including a wheelchair)?: A Little Help needed standing up from a chair using your arms (e.g., wheelchair or bedside chair)?: A Little Help needed to walk in hospital room?: A Little Help needed climbing 3-5 steps with a railing? : A Lot 6 Click Score: 17    End of Session Equipment Utilized During Treatment: Gait belt Activity Tolerance: Patient tolerated treatment well Patient left: in bed;with bed alarm set Nurse Communication: Mobility status PT Visit Diagnosis: Unsteadiness on feet (R26.81);Repeated falls (R29.6);Muscle weakness (generalized) (M62.81);History of falling (Z91.81);Difficulty in walking, not elsewhere classified (R26.2);Pain Pain - Right/Left: Right Pain - part of body: Hip     Time: 5885-0277 PT Time Calculation (min) (ACUTE ONLY): 28 min  Charges:  $Gait Training: 8-22 mins $Therapeutic Exercise: 8-22 mins                     Greggory Stallion, PT, DPT, GCS [phone]    Edessa Jakubowicz 01/09/2023, 4:12 PM

## 2023-01-10 DIAGNOSIS — I10 Essential (primary) hypertension: Secondary | ICD-10-CM | POA: Diagnosis not present

## 2023-01-10 DIAGNOSIS — W19XXXA Unspecified fall, initial encounter: Secondary | ICD-10-CM | POA: Diagnosis not present

## 2023-01-10 DIAGNOSIS — Y92009 Unspecified place in unspecified non-institutional (private) residence as the place of occurrence of the external cause: Secondary | ICD-10-CM | POA: Diagnosis not present

## 2023-01-10 LAB — CBC
HCT: 16 % — ABNORMAL LOW (ref 36.0–46.0)
Hemoglobin: 5.1 g/dL — ABNORMAL LOW (ref 12.0–15.0)
MCH: 32.1 pg (ref 26.0–34.0)
MCHC: 31.9 g/dL (ref 30.0–36.0)
MCV: 100.6 fL — ABNORMAL HIGH (ref 80.0–100.0)
Platelets: 218 10*3/uL (ref 150–400)
RBC: 1.59 MIL/uL — ABNORMAL LOW (ref 3.87–5.11)
RDW: 14.8 % (ref 11.5–15.5)
WBC: 6.4 10*3/uL (ref 4.0–10.5)
nRBC: 0.6 % — ABNORMAL HIGH (ref 0.0–0.2)

## 2023-01-10 LAB — HEMOGLOBIN AND HEMATOCRIT, BLOOD
HCT: 22.7 % — ABNORMAL LOW (ref 36.0–46.0)
Hemoglobin: 7.6 g/dL — ABNORMAL LOW (ref 12.0–15.0)

## 2023-01-10 LAB — BASIC METABOLIC PANEL
Anion gap: 6 (ref 5–15)
BUN: 34 mg/dL — ABNORMAL HIGH (ref 8–23)
CO2: 24 mmol/L (ref 22–32)
Calcium: 8.7 mg/dL — ABNORMAL LOW (ref 8.9–10.3)
Chloride: 104 mmol/L (ref 98–111)
Creatinine, Ser: 1.23 mg/dL — ABNORMAL HIGH (ref 0.44–1.00)
GFR, Estimated: 45 mL/min — ABNORMAL LOW (ref >60)
Glucose, Bld: 104 mg/dL — ABNORMAL HIGH (ref 70–99)
Potassium: 4.5 mmol/L (ref 3.5–5.1)
Sodium: 134 mmol/L — ABNORMAL LOW (ref 135–145)

## 2023-01-10 LAB — MAGNESIUM: Magnesium: 2.3 mg/dL (ref 1.7–2.4)

## 2023-01-10 LAB — PREPARE RBC (CROSSMATCH)

## 2023-01-10 LAB — PHOSPHORUS: Phosphorus: 3 mg/dL (ref 2.5–4.6)

## 2023-01-10 MED ORDER — SODIUM CHLORIDE 0.9% IV SOLUTION: Freq: Once | INTRAVENOUS | Status: DC

## 2023-01-10 NOTE — Plan of Care (Signed)

## 2023-01-10 NOTE — Progress Notes (Signed)
  Subjective:  Patient reports pain as mild.  No other complaints.  Objective:   VITALS:   Vitals:   01/10/23 1043 01/10/23 1059 01/10/23 1424 01/10/23 1440  BP: 111/61 115/76 120/65 121/69  Pulse: (!) 106 (!) 102 100 74  Resp: '18 18 20 18  '$ Temp: 98.4 F (36.9 C) 98.4 F (36.9 C) 97.9 F (36.6 C) 97.8 F (36.6 C)  TempSrc: Oral Oral Oral   SpO2: 97% 98% 99% 100%  Weight:      Height:        PHYSICAL EXAM:  ABD soft Sensation intact distally Dorsiflexion/Plantar flexion intact Incision: scant drainage No cellulitis present Compartment soft  LABS  Results for orders placed or performed during the hospital encounter of 01/07/23 (from the past 24 hour(s))  Basic metabolic panel     Status: Abnormal   Collection Time: 01/10/23  5:01 AM  Result Value Ref Range   Sodium 134 (L) 135 - 145 mmol/L   Potassium 4.5 3.5 - 5.1 mmol/L   Chloride 104 98 - 111 mmol/L   CO2 24 22 - 32 mmol/L   Glucose, Bld 104 (H) 70 - 99 mg/dL   BUN 34 (H) 8 - 23 mg/dL   Creatinine, Ser 1.23 (H) 0.44 - 1.00 mg/dL   Calcium 8.7 (L) 8.9 - 10.3 mg/dL   GFR, Estimated 45 (L) >60 mL/min   Anion gap 6 5 - 15  CBC     Status: Abnormal   Collection Time: 01/10/23  5:01 AM  Result Value Ref Range   WBC 6.4 4.0 - 10.5 K/uL   RBC 1.59 (L) 3.87 - 5.11 MIL/uL   Hemoglobin 5.1 (L) 12.0 - 15.0 g/dL   HCT 16.0 (L) 36.0 - 46.0 %   MCV 100.6 (H) 80.0 - 100.0 fL   MCH 32.1 26.0 - 34.0 pg   MCHC 31.9 30.0 - 36.0 g/dL   RDW 14.8 11.5 - 15.5 %   Platelets 218 150 - 400 K/uL   nRBC 0.6 (H) 0.0 - 0.2 %  Magnesium     Status: None   Collection Time: 01/10/23  5:01 AM  Result Value Ref Range   Magnesium 2.3 1.7 - 2.4 mg/dL  Phosphorus     Status: None   Collection Time: 01/10/23  5:01 AM  Result Value Ref Range   Phosphorus 3.0 2.5 - 4.6 mg/dL  Prepare RBC (crossmatch)     Status: None   Collection Time: 01/10/23 10:10 AM  Result Value Ref Range   Order Confirmation      DUPLICATE REQUEST Performed at  [facility], [address] [place], [place] [address]     No results found.  Assessment/Plan: 2 Days Post-Op   Principal Problem:   Fall at home, initial encounter Active Problems:   Benign essential HTN   Coronary artery disease involving native coronary artery of native heart   Prediabetes   LBBB (left bundle branch block)   Paroxysmal atrial fibrillation (HCC)   Anemia   Closed right hip fracture, initial encounter ([place])   Up with therapy Discharge home with home health when medically stable OK to restart Eliquis tomorrow if hct/hgb have increased appropriately Dressing changed   Lovell Sheehan , MD 01/10/2023, 3:21 PM

## 2023-01-10 NOTE — Progress Notes (Signed)
[facility] Progress Note  Patient: Kelsey Carter    CHE:[id number]  DOA: 01/07/2023     Date of Service: the patient was seen and examined on 01/10/2023  Chief Complaint  Patient presents with   Fall   Hip Pain   Brief hospital course: Kelsey Carter is an 77 y.o. female with past medical history of paroxysmal A-fib, CAD s/p PCI, HTN, history of breast cancer with chemotherapy and neuropathy, presented at [facility] ED s/p fall.  Patient stated that she missed a step and she fell, had right hip pain, denies any LOC.  Patient denies any headache or dizziness, no chest pain or palpitations. ED workup: CT head negative for any acute findings CT C-spine: Prominent degenerative changes throughout the cervical spine. Anterior subluxation at C3-4 and retrolisthesis at C5-6 are nonspecific but likely degenerative. 2. No acute displaced fractures are identified. X-ray right hip: Acute fracture of the proximal right femur.     Assessment and Plan:  Right hip fracture S/p mechanical fall Orthopedics consulted, s/p right intertrochanteric IMN done on 1/19 Continue fall precautions Continue as needed medication for pain control PT and OT eval done, recommended home health PT which will be arranged by case manager.    Anemia of chronic disease and due to acute blood loss after surgery Hb 8.2-7.8--6.6--5.1 1/20 1 unit of PRBC transfusion ordered, but patient is refusing, she verbalized risk of not getting blood transfusion. 1/21 today patient agreed for PRBC transfusions 1 unit was ordered. Monitor  H&H and transfuse if hemoglobin less than 7   Paroxysmal A-fib, CAD status PCI, hypertension LD History of LBBB Resumed amiodarone 200 mg p.o. daily home dose Resumed statin Held metoprolol for now due to low blood pressure Held Lasix for now, patient is euvolemic Hold Eliquis for now as hemoglobin dropped, resume Eliquis when H&H stable and cleared by orthopedic surgery. Monitor BP  and titrate medications accordingly  Hypothyroidism, continue Synthroid  Prediabetic Mild hyperglycemia, continue to monitor Continue diet control   Body mass index is 22.67 kg/m.  Nutrition Problem: Increased nutrient needs Etiology: post-op healing Interventions: Interventions: MVI     Diet: Regular diet DVT Prophylaxis: SCDs for now, will resume Eliquis when cleared by orthopedic surgery  Advance goals of care discussion: Full code  Family Communication: family was not present at bedside, at the time of interview.  The pt provided permission to discuss medical plan with the family. Opportunity was given to ask question and all questions were answered satisfactorily.   Disposition:  Pt is from Home, admitted with fall and right hip fracture, developed anemia, hemoglobin dropped, receiving PRBC transfusion today.  PT and OT recommended home health PT which will be arranged.   Discharge to Home health PT when clinically stable, may be tomorrow if H&H remained stable.    Subjective: No significant events overnight, pain is under control, patient agreed for 1 unit of PRBC transfusion today due to significantly low Hb 5.1. Patient denies any other active issues, she is hoping to go home tomorrow.   Physical Exam: General: NAD, lying comfortably Appear in no distress, affect appropriate Eyes: PERRLA ENT: Oral Mucosa Clear, moist  Neck: no JVD,  Cardiovascular: S1 and S2 Present, no Murmur,  Respiratory: good respiratory effort, Bilateral Air entry equal and Decreased, no Crackles, no wheezes Abdomen: Bowel Sound present, Soft and no tenderness,  Skin: no rashes Extremities: no Pedal edema, no calf tenderness.  Right hip dressing CDI and other 1 has dried blood. Neurologic:  without any new focal findings Gait not checked due to patient safety concerns  Vitals:   01/09/23 1555 01/09/23 2325 01/10/23 1043 01/10/23 1059  BP: 112/63 (!) 101/59 111/61 115/76  Pulse: 85 96 (!)  106 (!) 102  Resp: '16 16 18 18  '$ Temp: 98.2 F (36.8 C) 98 F (36.7 C) 98.4 F (36.9 C) 98.4 F (36.9 C)  TempSrc:   Oral Oral  SpO2: 100% 95% 97% 98%  Weight:      Height:        Intake/Output Summary (Last 24 hours) at 01/10/2023 1222 Last data filed at 01/09/2023 1800 Gross per 24 hour  Intake 480 ml  Output --  Net 480 ml   Filed Weights   01/07/23 1704 01/08/23 0649  Weight: 58.1 kg 58.1 kg    Data Reviewed: I have personally reviewed and interpreted daily labs, tele strips, imagings as discussed above. I reviewed all nursing notes, pharmacy notes, vitals, pertinent old records I have discussed plan of care as described above with RN and patient/family.  CBC: Recent Labs  Lab 01/07/23 1710 01/08/23 0525 01/08/23 1413 01/09/23 0515 01/10/23 0501  WBC 5.8 5.8  --  5.3 6.4  NEUTROABS 2.3  --   --   --   --   HGB 10.6* 8.2* 7.8* 6.6* 5.1*  HCT 32.0* 24.6* 23.3* 20.2* 16.0*  MCV 100.0 98.8  --  99.5 100.6*  PLT 308 262  --  237 621   Basic Metabolic Panel: Recent Labs  Lab 01/07/23 1710 01/08/23 0523 01/08/23 0525 01/09/23 0515 01/10/23 0501  NA 136  --  135 134* 134*  K 3.3*  --  3.9 5.0 4.5  CL 100  --  103 101 104  CO2 25  --  '26 25 24  '$ GLUCOSE 161*  --  118* 138* 104*  BUN 17  --  18 28* 34*  CREATININE 1.08*  --  1.00 1.26* 1.23*  CALCIUM 9.1  --  8.7* 9.1 8.7*  MG  --  2.0  --  2.2 2.3  PHOS  --  3.9  --  4.7* 3.0    Studies: No results found.  Scheduled Meds:  sodium chloride   Intravenous Once   sodium chloride   Intravenous Once   amiodarone  200 mg Oral Daily   atorvastatin  10 mg Oral Daily   docusate sodium  100 mg Oral BID   levothyroxine  50 mcg Oral q AM   multivitamin with minerals  1 tablet Oral Daily   Continuous Infusions:  methocarbamol (ROBAXIN) IV     PRN Meds: acetaminophen, bisacodyl, bisacodyl, diphenhydrAMINE, fentaNYL (SUBLIMAZE) injection, magnesium citrate, methocarbamol **OR** methocarbamol (ROBAXIN) IV,  metoCLOPramide **OR** metoCLOPramide (REGLAN) injection, ondansetron **OR** ondansetron (ZOFRAN) IV, polyethylene glycol, zolpidem  Time spent: 50 minutes  Author: Val Riles. MD [facility] 01/10/2023 12:22 PM  To reach On-call, see care teams to locate the attending and reach out to them via [email or web address]. If 7PM-7AM, please contact night-coverage If you still have difficulty reaching the attending provider, please page the [facility] (Director on Call) for [facility] on amion for assistance.

## 2023-01-10 NOTE — Progress Notes (Signed)
PT Cancellation Note  Patient Details Name: Kelsey Carter MRN: [id number] DOB: 09-09-1946   Cancelled Treatment:    Reason Eval/Treat Not Completed: Other (comment)  HgB 5.1 this am.  Pt receiving blood transfusion.  Checked in with pt at 1:30 and blood still running.  Pt stated that she would be having labs done after then possibly more blood after that.  Will monitor but anticipate continuing therapy sessions in am.  Chesley Noon 01/10/2023, 1:33 PM

## 2023-01-10 NOTE — Progress Notes (Signed)
Patient was advised of the hemoglobin level of 5.1 and that it going down.  Patient was educated on the importance of the blood transfusion.  Patient has refused and stated that "I just want to go home".  Sharion Settler, NP and Val Riles, MD has been made aware of the hemoglobin level and refusal of the blood transfusion.  Nurse has also added the day shift nurse to the chat message as well.

## 2023-01-11 ENCOUNTER — Encounter: Payer: Self-pay | Admitting: Orthopedic Surgery

## 2023-01-11 DIAGNOSIS — D649 Anemia, unspecified: Secondary | ICD-10-CM

## 2023-01-11 DIAGNOSIS — Y92009 Unspecified place in unspecified non-institutional (private) residence as the place of occurrence of the external cause: Secondary | ICD-10-CM | POA: Diagnosis not present

## 2023-01-11 DIAGNOSIS — W19XXXA Unspecified fall, initial encounter: Secondary | ICD-10-CM | POA: Diagnosis not present

## 2023-01-11 LAB — BASIC METABOLIC PANEL
Anion gap: 6 (ref 5–15)
BUN: 23 mg/dL (ref 8–23)
CO2: 26 mmol/L (ref 22–32)
Calcium: 8.8 mg/dL — ABNORMAL LOW (ref 8.9–10.3)
Chloride: 107 mmol/L (ref 98–111)
Creatinine, Ser: 1 mg/dL (ref 0.44–1.00)
GFR, Estimated: 58 mL/min — ABNORMAL LOW (ref >60)
Glucose, Bld: 100 mg/dL — ABNORMAL HIGH (ref 70–99)
Potassium: 4.3 mmol/L (ref 3.5–5.1)
Sodium: 139 mmol/L (ref 135–145)

## 2023-01-11 LAB — CBC
HCT: 22.5 % — ABNORMAL LOW (ref 36.0–46.0)
Hemoglobin: 7.4 g/dL — ABNORMAL LOW (ref 12.0–15.0)
MCH: 32 pg (ref 26.0–34.0)
MCHC: 32.9 g/dL (ref 30.0–36.0)
MCV: 97.4 fL (ref 80.0–100.0)
Platelets: 235 10*3/uL (ref 150–400)
RBC: 2.31 MIL/uL — ABNORMAL LOW (ref 3.87–5.11)
RDW: 16.6 % — ABNORMAL HIGH (ref 11.5–15.5)
WBC: 4.8 10*3/uL (ref 4.0–10.5)
nRBC: 0.6 % — ABNORMAL HIGH (ref 0.0–0.2)

## 2023-01-11 LAB — TYPE AND SCREEN
ABO/RH(D): O POS
Antibody Screen: NEGATIVE
Unit division: 0

## 2023-01-11 LAB — PHOSPHORUS: Phosphorus: 2.8 mg/dL (ref 2.5–4.6)

## 2023-01-11 LAB — MAGNESIUM: Magnesium: 2.3 mg/dL (ref 1.7–2.4)

## 2023-01-11 LAB — BPAM RBC
Blood Product Expiration Date: 202402242359
ISSUE DATE / TIME: 202401211039
Unit Type and Rh: 5100

## 2023-01-11 MED ORDER — POLYETHYLENE GLYCOL 3350 17 G PO PACK: 17.0000 g | PACK | Freq: Every day | ORAL | 0 refills | Status: AC | PRN

## 2023-01-11 MED ORDER — ADULT MULTIVITAMIN W/MINERALS CH: 1.0000 | ORAL_TABLET | Freq: Every day | ORAL | 2 refills | Status: AC

## 2023-01-11 MED ORDER — ACETAMINOPHEN 325 MG PO TABS: 650.0000 mg | ORAL_TABLET | Freq: Four times a day (QID) | ORAL | Status: AC | PRN

## 2023-01-11 MED ORDER — BISACODYL 5 MG PO TBEC: 10.0000 mg | DELAYED_RELEASE_TABLET | Freq: Every day | ORAL | 0 refills | Status: AC | PRN

## 2023-01-11 MED ORDER — APIXABAN 5 MG PO TABS: 5.0000 mg | ORAL_TABLET | Freq: Two times a day (BID) | ORAL | Status: DC

## 2023-01-11 NOTE — Plan of Care (Signed)
  Problem: Education: Goal: Knowledge of General Education information will improve Description: Including pain rating scale, medication(s)/side effects and non-pharmacologic comfort measures 01/11/2023 1200 by Alferd Apa, RN Outcome: Adequate for Discharge 01/11/2023 1019 by Alferd Apa, RN Outcome: Progressing   Problem: Health Behavior/Discharge Planning: Goal: Ability to manage health-related needs will improve 01/11/2023 1200 by Alferd Apa, RN Outcome: Adequate for Discharge 01/11/2023 1019 by Alferd Apa, RN Outcome: Progressing   Problem: Clinical Measurements: Goal: Ability to maintain clinical measurements within normal limits will improve 01/11/2023 1200 by Alferd Apa, RN Outcome: Adequate for Discharge 01/11/2023 1019 by Alferd Apa, RN Outcome: Progressing Goal: Will remain free from infection 01/11/2023 1200 by Alferd Apa, RN Outcome: Adequate for Discharge 01/11/2023 1019 by Alferd Apa, RN Outcome: Progressing Goal: Diagnostic test results will improve 01/11/2023 1200 by Alferd Apa, RN Outcome: Adequate for Discharge 01/11/2023 1019 by Alferd Apa, RN Outcome: Progressing Goal: Respiratory complications will improve 01/11/2023 1200 by Alferd Apa, RN Outcome: Adequate for Discharge 01/11/2023 1019 by Alferd Apa, RN Outcome: Progressing Goal: Cardiovascular complication will be avoided 01/11/2023 1200 by Alferd Apa, RN Outcome: Adequate for Discharge 01/11/2023 1019 by Alferd Apa, RN Outcome: Progressing   Problem: Activity: Goal: Risk for activity intolerance will decrease 01/11/2023 1200 by Alferd Apa, RN Outcome: Adequate for Discharge 01/11/2023 1019 by Alferd Apa, RN Outcome: Progressing   Problem: Nutrition: Goal: Adequate nutrition will be maintained 01/11/2023 1200 by Alferd Apa, RN Outcome: Adequate for Discharge 01/11/2023 1019 by Alferd Apa, RN Outcome: Progressing   Problem: Coping: Goal: Level of anxiety  will decrease 01/11/2023 1200 by Alferd Apa, RN Outcome: Adequate for Discharge 01/11/2023 1019 by Alferd Apa, RN Outcome: Progressing   Problem: Elimination: Goal: Will not experience complications related to bowel motility 01/11/2023 1200 by Alferd Apa, RN Outcome: Adequate for Discharge 01/11/2023 1019 by Alferd Apa, RN Outcome: Progressing Goal: Will not experience complications related to urinary retention 01/11/2023 1200 by Alferd Apa, RN Outcome: Adequate for Discharge 01/11/2023 1019 by Alferd Apa, RN Outcome: Progressing   Problem: Pain Managment: Goal: General experience of comfort will improve 01/11/2023 1200 by Alferd Apa, RN Outcome: Adequate for Discharge 01/11/2023 1019 by Alferd Apa, RN Outcome: Progressing   Problem: Safety: Goal: Ability to remain free from injury will improve 01/11/2023 1200 by Alferd Apa, RN Outcome: Adequate for Discharge 01/11/2023 1019 by Alferd Apa, RN Outcome: Progressing   Problem: Skin Integrity: Goal: Risk for impaired skin integrity will decrease 01/11/2023 1200 by Alferd Apa, RN Outcome: Adequate for Discharge 01/11/2023 1019 by Alferd Apa, RN Outcome: Progressing

## 2023-01-11 NOTE — Progress Notes (Signed)
Occupational Therapy Treatment Patient Details Name: LABELLA ZAHRADNIK MRN: [id number] DOB: Apr 17, 1946 Today's Date: 01/11/2023   History of present illness Pt admitted for Fall at home resulting in hip fracture. Pt is s/p R hip IM nailing on 01/08/23. History includes Afib, cardiomyopathy, HTN, and GERD.   OT comments  Upon entering session, pt sitting up in recliner with spouse present. Both agreeable to OT. Pt deferred grooming/toileting tasks this date 2/2 already completing recently. Tx session focused on energy conservation techniques to improve activity tolerance and safety with ADLs. Education included the 4 P's (plan, prioritize, pace, position) of energy conservation, pursed lip breathing, diaphragmatic breathing, and specific strategies for ADLs (toileting, grooming, bathing/showering, dressing). OT discussed specific examples of sitting when possible to complete self-care tasks (I.e. dressing, bathing), gather all the necessary items in advance, avoid bending and reaching, listen to your body and take rest breaks before you feel tired. Pt demonstrated good carryover of education provided by OT. Pt will benefit from further opportunities to practice implementing energy conservation techniques during self-care tasks before returning home. Pt is making progress toward goal completion. D/C recommendation remains appropriate. OT will continue to follow acutely.    Recommendations for follow up therapy are one component of a multi-disciplinary discharge planning process, led by the attending physician.  Recommendations may be updated based on patient status, additional functional criteria and insurance authorization.    Follow Up Recommendations  Home health OT     Assistance Recommended at Discharge Intermittent Supervision/Assistance  Patient can return home with the following  A little help with walking and/or transfers;A little help with bathing/dressing/bathroom;Assistance with  cooking/housework;Assist for transportation;Help with stairs or ramp for entrance   Equipment Recommendations  None recommended by OT    Recommendations for Other Services      Precautions / Restrictions Precautions Precautions: Fall Restrictions Weight Bearing Restrictions: Yes RLE Weight Bearing: Weight bearing as tolerated       Mobility Bed Mobility               General bed mobility comments: in chair before and after session    Transfers                         Balance Overall balance assessment: Needs assistance Sitting-balance support: Feet supported Sitting balance-Leahy Scale: Good                                     ADL either performed or assessed with clinical judgement   ADL Overall ADL's : Needs assistance/impaired                                       General ADL Comments: Pt deferred grooming/toileting tasks this date, pt stating "I already did that"    Extremity/Trunk Assessment Upper Extremity Assessment Upper Extremity Assessment: Overall WFL for tasks assessed   Lower Extremity Assessment Lower Extremity Assessment: Generalized weakness        Vision Baseline Vision/History: 1 Wears glasses Patient Visual Report: No change from baseline     Perception     Praxis      Cognition Arousal/Alertness: Awake/alert Behavior During Therapy: WFL for tasks assessed/performed Overall Cognitive Status: Within Functional Limits for tasks assessed  Exercises Other Exercises Other Exercises: Education provided re: energy conservation techniques with handout provided    Shoulder Instructions       General Comments      Pertinent Vitals/ Pain       Pain Assessment Pain Assessment: Faces Faces Pain Scale: Hurts a little bit Pain Location: R hip Pain Descriptors / Indicators: Operative site guarding, Grimacing Pain Intervention(s):  Limited activity within patient's tolerance, Monitored during session, Repositioned  Home Living                                          Prior Functioning/Environment              Frequency  Min 2X/week        Progress Toward Goals  OT Goals(current goals can now be found in the care plan section)  Progress towards OT goals: Progressing toward goals  Acute Rehab OT Goals Patient Stated Goal: return home OT Goal Formulation: With patient/family Time For Goal Achievement: 01/23/23 Potential to Achieve Goals: Good  Plan Discharge plan remains appropriate;Frequency remains appropriate    Co-evaluation                 AM-PAC OT "6 Clicks" Daily Activity     Outcome Measure   Help from another person eating meals?: None Help from another person taking care of personal grooming?: None Help from another person toileting, which includes using toliet, bedpan, or urinal?: A Little Help from another person bathing (including washing, rinsing, drying)?: A Little Help from another person to put on and taking off regular upper body clothing?: None Help from another person to put on and taking off regular lower body clothing?: A Little 6 Click Score: 21    End of Session    OT Visit Diagnosis: Unsteadiness on feet (R26.81);Muscle weakness (generalized) (M62.81);History of falling (Z91.81);Pain Pain - Right/Left: Right Pain - part of body: Hip   Activity Tolerance Patient tolerated treatment well   Patient Left in chair;with call bell/phone within reach;with chair alarm set;with family/visitor present   Nurse Communication Mobility status        Time: 6734-1937 OT Time Calculation (min): 10 min  Charges: OT General Charges $OT Visit: 1 Visit OT Treatments $Therapeutic Activity: 8-22 mins  [facility] MS, OTR/L ascom [phone]  01/11/23, 12:56 PM

## 2023-01-11 NOTE — TOC Progression Note (Signed)
Transition of Care [facility]) - Progression Note    Patient Details  Name: Kelsey Carter MRN: [id number] Date of Birth: 1946/02/24  [facility]) CM/SW Brenda, RN Phone Number: 01/11/2023, 9:43 AM  Clinical Narrative:   Reached out to [facility] to check and see if they accepted the patient for [facility] services      Barriers to Discharge: Continued Medical Work up  Expected Discharge Plan and Services                         DME Arranged: Gilford Rile rolling DME Agency: [employer] Date DME Agency Contacted: 01/09/23 Time DME Agency Contacted: [phone] Representative spoke with at DME Agency: [place]: [place] Date [place]: 01/09/23 Time [place]: 1454 Representative spoke with at [place]: [place] ([place]) Interventions SDOH Screenings   Food Insecurity: No Food Insecurity (01/07/2023)  Housing: Low Risk  (01/07/2023)  Transportation Needs: No Transportation Needs (01/07/2023)  Utilities: Not At Risk (01/07/2023)  Depression (PHQ2-9): Low Risk  (04/14/2022)  Financial Resource Strain: Low Risk  (03/31/2018)  Physical Activity: Inactive (03/31/2018)  Social Connections: Unknown (03/31/2018)  Stress: No Stress Concern Present (03/31/2018)  Tobacco Use: Medium Risk (01/08/2023)    Readmission Risk Interventions     No data to display

## 2023-01-11 NOTE — Progress Notes (Signed)
Physical Therapy Treatment Patient Details Name: Kelsey Carter MRN: [id number] DOB: Jun 30, 1946 Today's Date: 01/11/2023   History of Present Illness Pt admitted for Fall at home resulting in hip fracture. Pt is s/p R hip IM nailing on 01/08/23. History includes Afib, cardiomyopathy, HTN, and GERD.    PT Comments    Pt in chair.  Feeling better.  HgB remains decreased at 7.4.  Pt ready to walk this am.  She is able stand and walk 60' x 2 with RW and seated rest in wheelchair in hallway due to fatigue.  Pt asymptomatic with gait and HR and O2 remain stable and WFL during mobility.  She does have on LOB when turning to chair when she stated she missed the arm rest when she went to sit and is guided safely by Probation officer.  She is given a gait belt for home use with husband and encouraged +1 assist at all times with mobility.  Pt declined stair trial.  She stated they have a ramp in/out of home and express interest in having a wheelchair for accessing home but feel more comfortable having access to wheelchair.  Will relay to TOC.   While pt is able to ambulate short distances it would benefit her to have a wheelchair for ramp due to balance and to access her home while she recovers.  Patient suffers from hip fracture which impairs his/her ability to perform daily activities like toileting, feeding, dressing, grooming, bathing in the home. A cane, walker, crutch will not resolve the patient's issue with performing activities of daily living. A lightweight wheelchair and cushion is required/recommended and will allow patient to safely perform daily activities.   Patient can safely propel the wheelchair in the home or has a caregiver who can provide assistance.     Recommendations for follow up therapy are one component of a multi-disciplinary discharge planning process, led by the attending physician.  Recommendations may be updated based on patient status, additional functional criteria and insurance  authorization.  Follow Up Recommendations  Home health PT     Assistance Recommended at Discharge Intermittent Supervision/Assistance  Patient can return home with the following A little help with walking and/or transfers;A little help with bathing/dressing/bathroom;Assist for transportation   Equipment Recommendations  Rolling walker (2 wheels);Wheelchair (measurements PT)    Recommendations for Other Services       Precautions / Restrictions Precautions Precautions: Fall Restrictions Weight Bearing Restrictions: Yes RLE Weight Bearing: Weight bearing as tolerated     Mobility  Bed Mobility               General bed mobility comments: in chair before and after session Patient Response: Cooperative  Transfers Overall transfer level: Needs assistance Equipment used: Rolling walker (2 wheels) Transfers: Sit to/from Stand Sit to Stand: Min guard                Ambulation/Gait Ambulation/Gait assistance: Min guard, Min assist Gait Distance (Feet): 60 Feet Assistive device: Rolling walker (2 wheels) Gait Pattern/deviations: Step-to pattern, Step-through pattern Gait velocity: decreased     General Gait Details: 60' x 2 with irregular gait pattern   Stairs             Wheelchair Mobility    Modified Rankin (Stroke Patients Only)       Balance Overall balance assessment: Needs assistance Sitting-balance support: Feet supported Sitting balance-Leahy Scale: Good     Standing balance support: Bilateral upper extremity supported Standing balance-Leahy Scale: Fair Standing balance  comment: one LOB while turning to chair where she misses arm rest and is controlled by Probation officer to chair                            Cognition Arousal/Alertness: Awake/alert Behavior During Therapy: WFL for tasks assessed/performed Overall Cognitive Status: Within Functional Limits for tasks assessed                                           Exercises      General Comments        Pertinent Vitals/Pain Pain Assessment Pain Assessment: Faces Faces Pain Scale: Hurts a little bit Pain Location: R hip Pain Descriptors / Indicators: Operative site guarding, Grimacing Pain Intervention(s): Limited activity within patient's tolerance, Monitored during session, Repositioned    Home Living                          Prior Function            PT Goals (current goals can now be found in the care plan section) Progress towards PT goals: Progressing toward goals    Frequency    BID      PT Plan Current plan remains appropriate    Co-evaluation              AM-PAC PT "6 Clicks" Mobility   Outcome Measure  Help needed turning from your back to your side while in a flat bed without using bedrails?: A Little Help needed moving from lying on your back to sitting on the side of a flat bed without using bedrails?: A Little Help needed moving to and from a bed to a chair (including a wheelchair)?: A Little Help needed standing up from a chair using your arms (e.g., wheelchair or bedside chair)?: A Little Help needed to walk in hospital room?: A Little Help needed climbing 3-5 steps with a railing? : A Lot 6 Click Score: 17    End of Session Equipment Utilized During Treatment: Gait belt Activity Tolerance: Patient tolerated treatment well Patient left: in chair;with call bell/phone within reach;with family/visitor present Nurse Communication: Mobility status PT Visit Diagnosis: Unsteadiness on feet (R26.81);Repeated falls (R29.6);Muscle weakness (generalized) (M62.81);History of falling (Z91.81);Difficulty in walking, not elsewhere classified (R26.2);Pain Pain - Right/Left: Right Pain - part of body: Hip     Time: 9371-6967 PT Time Calculation (min) (ACUTE ONLY): 16 min  Charges:  $Gait Training: 8-22 mins                   Chesley Noon, PTA 01/11/23, 9:09 AM

## 2023-01-11 NOTE — Plan of Care (Signed)

## 2023-01-11 NOTE — Discharge Summary (Signed)
[facility] Discharge Summary   Patient: Kelsey Carter YIF:[id number]  PCP: Kelsey Carter  Date of admission: 01/07/2023   Date of discharge:  01/11/2023     Discharge Diagnoses:  Principal Problem:   Fall at home, initial encounter Active Problems:   Closed right hip fracture, initial encounter ([place])   Paroxysmal atrial fibrillation (HCC)   LBBB (left bundle branch block)   Coronary artery disease involving native coronary artery of native heart   Benign essential HTN   Anemia   Prediabetes   Admitted From: Home Disposition:  Home with HHPT/OT  Recommendations for Outpatient Follow-up:  Follow-up with PCP in 1 week, repeat CBC after 1 week Follow with orthopedic surgery in 1 week for postop check. Follow up LABS/TEST: CBC in 1 week   Diet recommendation: Cardiac diet  Activity: The patient is advised to gradually reintroduce usual activities, as tolerated  Discharge Condition: stable  Code Status: Full code   History of present illness: As per the H and P dictated on admission Hospital Course:  Kelsey Carter is an 77 y.o. female with past medical history of paroxysmal A-fib, CAD s/p PCI, HTN, history of breast cancer with chemotherapy and neuropathy, presented at [facility] ED s/p fall.  Patient stated that she missed a step and she fell, had right hip pain, denies any LOC.  Patient denies any headache or dizziness, no chest pain or palpitations. ED workup: CT head negative for any acute findings CT C-spine: Prominent degenerative changes throughout the cervical spine. Anterior subluxation at C3-4 and retrolisthesis at C5-6 are nonspecific but likely degenerative. 2. No acute displaced fractures are identified. X-ray right hip: Acute fracture of the proximal right femur.    Assessment and Plan: # Right hip fracture, S/p mechanical fall. Orthopedics consulted, s/p right intertrochanteric IMN done on 1/19. Continue fall precautions, patient was advised to  take Tylenol as needed for pain control, patient does not wanted stronger medications such as opiates. PT and OT eval done, recommended home health PT which was arranged by case manager.  Patient was cleared by orthopedic surgery to follow-up with an outpatient in 1 to 2 weeks for postop check. # Anemia of chronic disease and due to acute blood loss after surgery Hb 8.2-7.8--6.6--5.1, On 1/20 she refused to get 1 unit of PRBC transfusion but on 1/21 today patient agreed for PRBC transfusions 1 unit was ordered.  Hb 7.4 posttransfusion.  Patient was advised to follow-up with PCP and repeat CBC after 1 week. #Paroxysmal A-fib, CAD status PCI, hypertension LD, History of LBBB Resumed amiodarone 200 mg p.o. daily and statin home dose. Held metoprolol during hospital stay due to low blood pressure. Held Lasix as patient was euvolemic. Hold Eliquis preop and also held.  For few days due to low hemoglobin.  Eliquis was resumed on 01/10/2023. # Hypothyroidism, continue Synthroid # Prediabetic, Mild hyperglycemia,  continue to monitor, continue diet control  Body mass index is 22.67 kg/m.  Nutrition Problem: Increased nutrient needs Etiology: post-op healing Nutrition Interventions: Interventions: MVI     Patient was seen by physical therapy, who recommended Home health, which was arranged. On the day of the discharge the patient's vitals were stable, and no other acute medical condition were reported by patient. the patient was felt safe to be discharge at Home with Home health.  Consultants: Orthopedic surgery Procedures: s/p right intertrochanteric IMN done on 1/19  Discharge Exam: General: Appear in no distress, no Rash; Oral Mucosa Clear, moist. Cardiovascular: S1  and S2 Present, no Murmur, Respiratory: normal respiratory effort, Bilateral Air entry present and no Crackles, no wheezes Abdomen: Bowel Sound present, Soft and no tenderness, no hernia Extremities: no Pedal edema, no calf  tenderness, s/p right hip ORIF, dressing CDI Neurology: alert and oriented to time, place, and person affect appropriate.  Filed Weights   01/07/23 1704 01/08/23 0649  Weight: 58.1 kg 58.1 kg   Vitals:   01/11/23 0049 01/11/23 0830  BP: 130/75 134/64  Pulse: 85 74  Resp: 18 18  Temp: 98 F (36.7 C) 98.2 F (36.8 C)  SpO2: 99% 100%    DISCHARGE MEDICATION: Allergies as of 01/11/2023       Reactions   Hydrocodone-acetaminophen Nausea And Vomiting   Severe vomitting   Empagliflozin Diarrhea, Other (See Comments)   Jardiance- lightheaded and diarrhea   Morphine And Related Nausea And Vomiting   Pt preference    Ciprofloxacin Other (See Comments)   Joint pain   Gabapentin Other (See Comments)   Eyes blurry; vision decreased, dizziness         Medication List     STOP taking these medications    alendronate 70 MG tablet Commonly known as: FOSAMAX   MAGNESIUM PO   meclizine 12.5 MG tablet Commonly known as: ANTIVERT   Potassium Citrate 99 MG Caps       TAKE these medications    acetaminophen 325 MG tablet Commonly known as: TYLENOL Take 2 tablets (650 mg total) by mouth every 6 (six) hours as needed for headache, fever, moderate pain or mild pain.   amiodarone 200 MG tablet Commonly known as: PACERONE Take 200 mg by mouth daily.   atorvastatin 10 MG tablet Commonly known as: LIPITOR Take 1 tablet (10 mg total) by mouth daily.   bisacodyl 5 MG EC tablet Commonly known as: DULCOLAX Take 2 tablets (10 mg total) by mouth daily as needed for moderate constipation.   Cholecalciferol 50 MCG (2000 UT) Caps Take 2,000 Units by mouth daily.   Eliquis 5 MG Tabs tablet Generic drug: apixaban Take 5 mg by mouth 2 (two) times daily.   furosemide 20 MG tablet Commonly known as: Lasix Take 1 tablet (20 mg total) by mouth daily.   levothyroxine 25 MCG tablet Commonly known as: SYNTHROID Take 25 mcg by mouth in the morning.   metoprolol succinate 25 MG 24  hr tablet Commonly known as: TOPROL-XL Take 0.5 tablets (12.5 mg total) by mouth 2 (two) times daily.   multivitamin with minerals Tabs tablet Take 1 tablet by mouth daily. Start taking on: January 12, 2023   polyethylene glycol 17 g packet Commonly known as: MIRALAX / GLYCOLAX Take 17 g by mouth daily as needed for mild constipation.               Durable Medical Equipment  (From admission, onward)           Start     Ordered   01/09/23 1502  For home use only DME Walker rolling  Once       Question Answer Comment  Walker: With 5 Inch Wheels   Patient needs a walker to treat with the following condition Closed right hip fracture ([place])      01/09/23 1501   01/09/23 1502  For home use only DME 4 wheeled rolling walker with seat  Once       Question:  Patient needs a walker to treat with the following condition  Answer:  Closed right  hip fracture ([place])   01/09/23 1501           Allergies  Allergen Reactions   Hydrocodone-Acetaminophen Nausea And Vomiting    Severe vomitting   Empagliflozin Diarrhea and Other (See Comments)    Jardiance- lightheaded and diarrhea   Morphine And Related Nausea And Vomiting    Pt preference    Ciprofloxacin Other (See Comments)    Joint pain    Gabapentin Other (See Comments)    Eyes blurry; vision decreased, dizziness    Discharge Instructions     Call Carter for:  difficulty breathing, headache or visual disturbances   Complete by: As directed    Call Carter for:  extreme fatigue   Complete by: As directed    Call Carter for:  persistant dizziness or light-headedness   Complete by: As directed    Call Carter for:  persistant nausea and vomiting   Complete by: As directed    Call Carter for:  severe uncontrolled pain   Complete by: As directed    Call Carter for:  temperature >100.4   Complete by: As directed    Diet - low sodium heart healthy   Complete by: As directed    Discharge instructions   Complete by: As directed    Follow-up  with PCP in 1 week, repeat CBC after 1 week Follow with orthopedic surgery in 1 week for postop check.   Increase activity slowly   Complete by: As directed        The results of significant diagnostics from this hospitalization (including imaging, microbiology, ancillary and laboratory) are listed below for reference.    Significant Diagnostic Studies: DG HIP UNILAT WITH PELVIS 2-3 VIEWS RIGHT  Result Date: 01/08/2023 CLINICAL DATA:  Right femoral nail elective surgery. Intraoperative fluoroscopy. EXAM: DG HIP (WITH OR WITHOUT PELVIS) 2-3V RIGHT COMPARISON:  Pelvis and right hip radiographs 01/07/2023 FINDINGS: Images were performed intraoperatively without the presence of a radiologist. The patient is undergoing ORIF of the previously seen right proximal femoral intertrochanteric fracture with a long cephalomedullary nail. Near anatomic alignment. No hardware complication is seen. Total fluoroscopy images: 3 Total fluoroscopy time: 34 seconds Please see intraoperative findings for further detail. IMPRESSION: Fluoroscopic assistance provided for ORIF of the right proximal femoral intertrochanteric fracture. Electronically Signed   By: Yvonne Kendall M.D.   On: 01/08/2023 08:43   DG C-Arm 1-60 Min-No Report  Result Date: 01/08/2023 Fluoroscopy was utilized by the requesting physician.  No radiographic interpretation.   DG Hip Unilat  With Pelvis 2-3 Views Right  Result Date: 01/07/2023 CLINICAL DATA:  Status post fall. EXAM: DG HIP (WITH OR WITHOUT PELVIS) 2-3V RIGHT COMPARISON:  None Available. FINDINGS: An acute, comminuted fracture deformity is seen extending through the inter trochanteric region of the proximal right femur. There is no evidence of dislocation. Moderate severity degenerative changes are seen in the form of joint space narrowing and acetabular sclerosis. IMPRESSION: Acute fracture of the proximal right femur. Electronically Signed   By: Virgina Norfolk M.D.   On: 01/07/2023  17:52   DG Chest 1 View  Result Date: 01/07/2023 CLINICAL DATA:  Pain status post fall EXAM: CHEST  1 VIEW COMPARISON:  None Available. FINDINGS: The cardiomediastinal silhouette is unchanged in contour. Calcified atherosclerosis of the aortic arch. No focal pulmonary opacity. No pleural effusion or pneumothorax. The visualized upper abdomen is unremarkable. No acute osseous abnormality. IMPRESSION: No acute cardiopulmonary abnormality. Electronically Signed   By: Monica Becton.D.  On: 01/07/2023 17:51   CT Cervical Spine Wo Contrast  Result Date: 01/07/2023 CLINICAL DATA:  Neck trauma.  Fall. EXAM: CT CERVICAL SPINE WITHOUT CONTRAST TECHNIQUE: Multidetector CT imaging of the cervical spine was performed without intravenous contrast. Multiplanar CT image reconstructions were also generated. RADIATION DOSE REDUCTION: This exam was performed according to the departmental dose-optimization program which includes automated exposure control, adjustment of the mA and/or kV according to patient size and/or use of iterative reconstruction technique. COMPARISON:  None Available. FINDINGS: Alignment: There is straightening of usual cervical lordosis. Mild anterior subluxation of C3 on C4. Mild retrolisthesis of C5 on C6. Changes are nonspecific but likely degenerative. Normal alignment of the posterior elements. Skull base and vertebrae: Skull base appears intact. No vertebral compression deformities. No focal bone lesion or bone destruction. Soft tissues and spinal canal: No prevertebral soft tissue swelling. No abnormal paraspinal soft tissue mass or infiltration. Disc levels: Degenerative changes throughout the cervical spine with narrowed disc spaces and endplate osteophyte formation throughout. Calcification of disc material. Disc osteophyte complexes cause anterior effacement of the thecal sac at multiple levels, most prominent at C5-6. Degenerative changes in the facet joints. Hypertrophic changes  involving the facet joints and uncovertebral joints causes bone encroachment on the neural foramina at multiple levels bilaterally. Upper chest: Lung apices are clear. Other: None. IMPRESSION: 1. Prominent degenerative changes throughout the cervical spine. Anterior subluxation at C3-4 and retrolisthesis at C5-6 are nonspecific but likely degenerative. 2. No acute displaced fractures are identified. Electronically Signed   By: Lucienne Capers M.D.   On: 01/07/2023 17:43   CT HEAD WO CONTRAST (5MM)  Result Date: 01/07/2023 CLINICAL DATA:  Head trauma, minor (Age >= 65y) EXAM: CT HEAD WITHOUT CONTRAST TECHNIQUE: Contiguous axial images were obtained from the base of the skull through the vertex without intravenous contrast. RADIATION DOSE REDUCTION: This exam was performed according to the departmental dose-optimization program which includes automated exposure control, adjustment of the mA and/or kV according to patient size and/or use of iterative reconstruction technique. COMPARISON:  CT head 09/09/2021 FINDINGS: Brain: Cerebral ventricle sizes are concordant with the degree of cerebral volume loss. Patchy and confluent areas of decreased attenuation are noted throughout the deep and periventricular white matter of the cerebral hemispheres bilaterally, compatible with chronic microvascular ischemic disease. No evidence of large-territorial acute infarction. No parenchymal hemorrhage. No mass lesion. No extra-axial collection. No mass effect or midline shift. No hydrocephalus. Basilar cisterns are patent. Vascular: No hyperdense vessel. Atherosclerotic calcifications are present within the cavernous internal carotid arteries. Skull: No acute fracture or focal lesion. Sinuses/Orbits: Paranasal sinuses and mastoid air cells are clear. Bilateral lens replacement. Otherwise the orbits are unremarkable. Other: None. IMPRESSION: No acute intracranial abnormality. Electronically Signed   By: Iven Finn M.D.   On:  01/07/2023 17:41    Microbiology: Recent Results (from the past 240 hour(s))  SARS Coronavirus 2 by RT PCR (hospital order, performed in [facility] hospital lab) *cepheid single result test* Anterior Nasal Swab     Status: None   Collection Time: 01/07/23  5:11 PM   Specimen: Anterior Nasal Swab  Result Value Ref Range Status   SARS Coronavirus 2 by RT PCR NEGATIVE NEGATIVE Final    Comment: Performed at [facility], [address] [place], [place] [address]     Labs: CBC: Recent Labs  Lab 01/07/23 1710 01/08/23 0525 01/08/23 1413 01/09/23 0515 01/10/23 0501 01/10/23 1615 01/11/23 0417  WBC 5.8 5.8  --  5.3  6.4  --  4.8  NEUTROABS 2.3  --   --   --   --   --   --   HGB 10.6* 8.2* 7.8* 6.6* 5.1* 7.6* 7.4*  HCT 32.0* 24.6* 23.3* 20.2* 16.0* 22.7* 22.5*  MCV 100.0 98.8  --  99.5 100.6*  --  97.4  PLT 308 262  --  237 218  --  250   Basic Metabolic Panel: Recent Labs  Lab 01/07/23 1710 01/08/23 0523 01/08/23 0525 01/09/23 0515 01/10/23 0501 01/11/23 0417  NA 136  --  135 134* 134* 139  K 3.3*  --  3.9 5.0 4.5 4.3  CL 100  --  103 101 104 107  CO2 25  --  '26 25 24 26  '$ GLUCOSE 161*  --  118* 138* 104* 100*  BUN 17  --  18 28* 34* 23  CREATININE 1.08*  --  1.00 1.26* 1.23* 1.00  CALCIUM 9.1  --  8.7* 9.1 8.7* 8.8*  MG  --  2.0  --  2.2 2.3 2.3  PHOS  --  3.9  --  4.7* 3.0 2.8   Liver Function Tests: No results for input(s): "AST", "ALT", "ALKPHOS", "BILITOT", "PROT", "ALBUMIN" in the last 168 hours. No results for input(s): "LIPASE", "AMYLASE" in the last 168 hours. No results for input(s): "AMMONIA" in the last 168 hours. Cardiac Enzymes: No results for input(s): "CKTOTAL", "CKMB", "CKMBINDEX", "TROPONINI" in the last 168 hours. BNP (last 3 results) No results for input(s): "BNP" in the last 8760 hours. CBG: No results for input(s): "GLUCAP" in the last 168 hours.  Time spent: 35 minutes  Signed:  Val Riles  [facility] 01/11/2023 12:00 PM

## 2023-01-11 NOTE — Progress Notes (Signed)
Met with the patient and her husband in the room, she is planning to go home today with [place] accepted the patient for [facility] services, I notified the patient She has a RW delivered by [employer], she wanted to get a wheelchair just for today, I explained to her that they do monthly rental but I would be happy to get arranged for her, she stated that she did not want it more than a day so she would have her husband go get one, Her husband will transport home No additional needs

## 2023-01-12 ENCOUNTER — Encounter: Payer: Self-pay | Admitting: Orthopedic Surgery

## 2023-01-13 DIAGNOSIS — I427 Cardiomyopathy due to drug and external agent: Secondary | ICD-10-CM | POA: Diagnosis not present

## 2023-01-13 DIAGNOSIS — G609 Hereditary and idiopathic neuropathy, unspecified: Secondary | ICD-10-CM | POA: Diagnosis not present

## 2023-01-13 DIAGNOSIS — S72001D Fracture of unspecified part of neck of right femur, subsequent encounter for closed fracture with routine healing: Secondary | ICD-10-CM | POA: Diagnosis not present

## 2023-01-13 DIAGNOSIS — I2510 Atherosclerotic heart disease of native coronary artery without angina pectoris: Secondary | ICD-10-CM | POA: Diagnosis not present

## 2023-01-13 DIAGNOSIS — I119 Hypertensive heart disease without heart failure: Secondary | ICD-10-CM | POA: Diagnosis not present

## 2023-01-13 DIAGNOSIS — E785 Hyperlipidemia, unspecified: Secondary | ICD-10-CM | POA: Diagnosis not present

## 2023-01-13 DIAGNOSIS — G8929 Other chronic pain: Secondary | ICD-10-CM | POA: Diagnosis not present

## 2023-01-13 DIAGNOSIS — W19XXXD Unspecified fall, subsequent encounter: Secondary | ICD-10-CM | POA: Diagnosis not present

## 2023-01-13 DIAGNOSIS — I480 Paroxysmal atrial fibrillation: Secondary | ICD-10-CM | POA: Diagnosis not present

## 2023-01-13 DIAGNOSIS — Z7901 Long term (current) use of anticoagulants: Secondary | ICD-10-CM | POA: Diagnosis not present

## 2023-01-14 ENCOUNTER — Encounter: Payer: Self-pay | Admitting: Orthopedic Surgery

## 2023-01-15 DIAGNOSIS — M818 Other osteoporosis without current pathological fracture: Secondary | ICD-10-CM | POA: Diagnosis not present

## 2023-01-15 DIAGNOSIS — T451X5A Adverse effect of antineoplastic and immunosuppressive drugs, initial encounter: Secondary | ICD-10-CM | POA: Diagnosis not present

## 2023-01-15 DIAGNOSIS — N1831 Chronic kidney disease, stage 3a: Secondary | ICD-10-CM | POA: Diagnosis not present

## 2023-01-15 DIAGNOSIS — Z79899 Other long term (current) drug therapy: Secondary | ICD-10-CM | POA: Diagnosis not present

## 2023-01-15 DIAGNOSIS — I10 Essential (primary) hypertension: Secondary | ICD-10-CM | POA: Diagnosis not present

## 2023-01-15 DIAGNOSIS — C029 Malignant neoplasm of tongue, unspecified: Secondary | ICD-10-CM | POA: Diagnosis not present

## 2023-01-15 DIAGNOSIS — D649 Anemia, unspecified: Secondary | ICD-10-CM | POA: Diagnosis not present

## 2023-01-15 DIAGNOSIS — Z9013 Acquired absence of bilateral breasts and nipples: Secondary | ICD-10-CM | POA: Diagnosis not present

## 2023-01-15 DIAGNOSIS — S72001D Fracture of unspecified part of neck of right femur, subsequent encounter for closed fracture with routine healing: Secondary | ICD-10-CM | POA: Diagnosis not present

## 2023-01-15 DIAGNOSIS — G620 Drug-induced polyneuropathy: Secondary | ICD-10-CM | POA: Diagnosis not present

## 2023-01-20 ENCOUNTER — Telehealth: Payer: Self-pay | Admitting: *Deleted

## 2023-01-20 ENCOUNTER — Encounter: Payer: Self-pay | Admitting: *Deleted

## 2023-01-20 NOTE — Patient Outreach (Signed)
  Care Coordination   Initial Visit Note   01/20/2023 Name: JOYANN SPIDLE MRN: [id number] DOB: [birth year]  KORRA CHRISTINE is a 77 y.o. year old female who sees Tracie Harrier, MD for primary care. I spoke with  [name] by phone today.  What matters to the patients health and wellness today?  Fall on 1/18 resulting in hip fracture requiring surgery.  State she is better now, building strength back up.     Goals Addressed             This Visit's Progress    No recurrent falls       Care Coordination Interventions: Provided written and verbal education re: potential causes of falls and Fall prevention strategies Advised patient of importance of notifying provider of falls Assessed for falls since last encounter Assessed patients knowledge of fall risk prevention secondary to previously provided education Screening for signs and symptoms of depression related to chronic disease state  Assessed social determinant of health barriers Has appointment with surgeon on Friday, 2/2 Confirmed she has home health for PT sessions already started         SDOH assessments and interventions completed:  Yes  SDOH Interventions Today    Flowsheet Row Most Recent Value  SDOH Interventions   Food Insecurity Interventions Intervention Not Indicated  Housing Interventions Intervention Not Indicated  Transportation Interventions Intervention Not Indicated        Care Coordination Interventions:  Yes, provided   Follow up plan: Follow up call scheduled for 2/7    Encounter Outcome:  Pt. Visit Completed   Valente David, RN, MSN, [place] [facility] Care Management Coordinator [phone]

## 2023-01-25 ENCOUNTER — Emergency Department: Payer: PPO

## 2023-01-25 ENCOUNTER — Other Ambulatory Visit: Payer: Self-pay

## 2023-01-25 ENCOUNTER — Emergency Department
Admission: EM | Admit: 2023-01-25 | Discharge: 2023-01-25 | Disposition: A | Discharge status: Home / Self Care | Payer: PPO | Attending: Emergency Medicine | Admitting: Emergency Medicine

## 2023-01-25 ENCOUNTER — Encounter: Payer: Self-pay | Admitting: Emergency Medicine

## 2023-01-25 DIAGNOSIS — S22070A Wedge compression fracture of T9-T10 vertebra, initial encounter for closed fracture: Secondary | ICD-10-CM | POA: Diagnosis not present

## 2023-01-25 DIAGNOSIS — M546 Pain in thoracic spine: Secondary | ICD-10-CM | POA: Diagnosis not present

## 2023-01-25 DIAGNOSIS — Z7901 Long term (current) use of anticoagulants: Secondary | ICD-10-CM | POA: Insufficient documentation

## 2023-01-25 DIAGNOSIS — M7989 Other specified soft tissue disorders: Secondary | ICD-10-CM | POA: Diagnosis not present

## 2023-01-25 DIAGNOSIS — S22068A Other fracture of T7-T8 thoracic vertebra, initial encounter for closed fracture: Secondary | ICD-10-CM | POA: Insufficient documentation

## 2023-01-25 DIAGNOSIS — T148XXA Other injury of unspecified body region, initial encounter: Secondary | ICD-10-CM

## 2023-01-25 DIAGNOSIS — S60222A Contusion of left hand, initial encounter: Secondary | ICD-10-CM | POA: Diagnosis not present

## 2023-01-25 DIAGNOSIS — M47812 Spondylosis without myelopathy or radiculopathy, cervical region: Secondary | ICD-10-CM | POA: Diagnosis not present

## 2023-01-25 DIAGNOSIS — W01198A Fall on same level from slipping, tripping and stumbling with subsequent striking against other object, initial encounter: Secondary | ICD-10-CM | POA: Diagnosis not present

## 2023-01-25 DIAGNOSIS — S22060A Wedge compression fracture of T7-T8 vertebra, initial encounter for closed fracture: Secondary | ICD-10-CM | POA: Diagnosis not present

## 2023-01-25 DIAGNOSIS — S22000A Wedge compression fracture of unspecified thoracic vertebra, initial encounter for closed fracture: Secondary | ICD-10-CM

## 2023-01-25 DIAGNOSIS — S0031XA Abrasion of nose, initial encounter: Secondary | ICD-10-CM | POA: Insufficient documentation

## 2023-01-25 DIAGNOSIS — S22078A Other fracture of T9-T10 vertebra, initial encounter for closed fracture: Secondary | ICD-10-CM | POA: Diagnosis not present

## 2023-01-25 DIAGNOSIS — M79642 Pain in left hand: Secondary | ICD-10-CM | POA: Diagnosis not present

## 2023-01-25 DIAGNOSIS — S0993XA Unspecified injury of face, initial encounter: Secondary | ICD-10-CM | POA: Diagnosis not present

## 2023-01-25 DIAGNOSIS — G8929 Other chronic pain: Secondary | ICD-10-CM | POA: Diagnosis not present

## 2023-01-25 NOTE — Discharge Instructions (Addendum)
As we discussed please use an ice pack to the back of your left hand for 30 minutes every 2 hours to help reduce pain and swelling.  You have suffered what appears to be 2 new compression fractures in the back.  Please follow-up with neurosurgery by calling the number provided to arrange a follow-up appointment.  Return to the emergency department for any weakness numbness or tingling of either leg, or any other symptom personally concerning to yourself.

## 2023-01-25 NOTE — ED Triage Notes (Addendum)
Arrived to ED via POV, states she got up from bed without her walker and ran into the wall and fell to the ground. Reports left hand pain, back pain and abrasions to nose and mouth. Denies loss of consciousness. States she takes eliquis. Reports abrasions to her face are from running into the wall.

## 2023-01-25 NOTE — ED Provider Notes (Signed)
[facility] Provider Note    Event Date/Time   First MD Initiated Contact with Patient 01/25/23 [phone]     (approximate)  History   Chief Complaint: Fall  HPI  Kelsey Carter is a 77 y.o. female with a past medical history of anemia, chronic pain, gastric reflux, atrial fibrillation on Eliquis who presents to the emergency department for a fall yesterday.  According to the patient yesterday morning she had a fall after stumbling into a wall.  Patient denies LOC.  Does have small abrasion to the bridge of her nose as well as a large hematoma to the dorsal aspect of her left hand.  Patient states her main concern is her left hand and her mid/upper back.  Denies any lower back pain.  No neck pain or headache.  Patient did have a recent femur fracture and repair, is using a walker but did not use the walker yesterday morning.  Patient has follow-up with orthopedics Dr. Harlow Mares tomorrow.  Physical Exam   Triage Vital Signs: ED Triage Vitals  Enc Vitals Group     BP 01/25/23 0654 132/72     Pulse Rate 01/25/23 0654 86     Resp 01/25/23 0654 20     Temp 01/25/23 0654 98.2 F (36.8 C)     Temp Source 01/25/23 0654 Oral     SpO2 01/25/23 0654 98 %     Weight 01/25/23 0655 128 lb (58.1 kg)     Height 01/25/23 0655 '5\' 5"'$  (1.651 m)     Head Circumference --      Peak Flow --      Pain Score 01/25/23 0654 8     Pain Loc --      Pain Edu? --      Excl. in [place]? --     Most recent vital signs: Vitals:   01/25/23 0654  BP: 132/72  Pulse: 86  Resp: 20  Temp: 98.2 F (36.8 C)  SpO2: 98%    General: Awake, no distress.  CV:  Good peripheral perfusion.  Regular rate and rhythm  Resp:  Normal effort.  Equal breath sounds bilaterally.  Abd:  No distention.  Soft, nontender.  No rebound or guarding. Other:  Mild tenderness palpation to the mid T-spine.  No L-spine tenderness.  Denies any L-spine pain.  No C-spine tenderness.  No signs of head trauma besides a  small abrasion to the nasal bridge.  Patient has a moderate hematoma to the dorsal aspect of her left hand, appears to be neurovascular intact distally.   ED Results / Procedures / Treatments   RADIOLOGY  I have reviewed and interpreted the x-ray images of the hand I do not see any obvious fracture on my evaluation. Thoracic x-ray read as 2 new compression fractures compared to 2022.  MEDICATIONS ORDERED IN ED: Medications - No data to display   IMPRESSION / MDM / [place] / ED COURSE  I reviewed the triage vital signs and the nursing notes.  Patient's presentation is most consistent with acute presentation with potential threat to life or bodily function.  Patient presents emergency department after a fall yesterday.  Patient that she was not using her walker when she is supposed to be using it.  Patient states she tripped and stumbled into a wall.  Does have swelling to her left hand consistent with hematoma to the dorsal aspect.  X-ray does not appear to show any fracture.  Patient is also  having mild to moderate pain in her mid/upper back.  X-ray of this area does show 2 new compression fractures compared to her MRI in 2022.  Will obtain CT imaging of the T-spine to further evaluate.  Patient is also on Eliquis given her fall and injuries will obtain CT imaging of the head and C-spine as a precaution to rule out intracranial hemorrhage/subdural.  Patient denies any lumbar spine tenderness on my exam denies any lumbar spine pain.  Good range of motion in her lower extremities without pain.  CT scan of the head and C-spine are negative for acute abnormality.  CT of the thoracic spine does show an acute compression fracture at T7 and T9 approximately 50% vertebral height loss.  Given these findings I did page neurosurgery.  We have not heard back from neurosurgery yet however the patient is adamant about leaving the emergency department.  States she is ready to go home and get some  rest.  Patient denies any weakness numbness or tingling in her legs.  I discussed with the patient to follow-up with neurosurgery which she is agreeable.  I also discussed strict return precautions.  FINAL CLINICAL IMPRESSION(S) / ED DIAGNOSES   Fall Thoracic compression fractures   Note:  This document was prepared using Dragon voice recognition software and may include unintentional dictation errors.   Harvest Dark, MD 01/25/23 1003

## 2023-01-26 DIAGNOSIS — I119 Hypertensive heart disease without heart failure: Secondary | ICD-10-CM | POA: Diagnosis not present

## 2023-01-26 DIAGNOSIS — G609 Hereditary and idiopathic neuropathy, unspecified: Secondary | ICD-10-CM | POA: Diagnosis not present

## 2023-01-26 DIAGNOSIS — E785 Hyperlipidemia, unspecified: Secondary | ICD-10-CM | POA: Diagnosis not present

## 2023-01-26 DIAGNOSIS — I2510 Atherosclerotic heart disease of native coronary artery without angina pectoris: Secondary | ICD-10-CM | POA: Diagnosis not present

## 2023-01-26 DIAGNOSIS — G8929 Other chronic pain: Secondary | ICD-10-CM | POA: Diagnosis not present

## 2023-01-26 DIAGNOSIS — W19XXXD Unspecified fall, subsequent encounter: Secondary | ICD-10-CM | POA: Diagnosis not present

## 2023-01-26 DIAGNOSIS — S72001D Fracture of unspecified part of neck of right femur, subsequent encounter for closed fracture with routine healing: Secondary | ICD-10-CM | POA: Diagnosis not present

## 2023-01-26 DIAGNOSIS — Z7901 Long term (current) use of anticoagulants: Secondary | ICD-10-CM | POA: Diagnosis not present

## 2023-01-26 DIAGNOSIS — I480 Paroxysmal atrial fibrillation: Secondary | ICD-10-CM | POA: Diagnosis not present

## 2023-01-26 DIAGNOSIS — I427 Cardiomyopathy due to drug and external agent: Secondary | ICD-10-CM | POA: Diagnosis not present

## 2023-01-26 DIAGNOSIS — S72001A Fracture of unspecified part of neck of right femur, initial encounter for closed fracture: Secondary | ICD-10-CM | POA: Diagnosis not present

## 2023-01-27 ENCOUNTER — Ambulatory Visit: Payer: Self-pay | Admitting: *Deleted

## 2023-01-27 NOTE — Patient Outreach (Signed)
  Care Coordination   Follow Up Visit Note   01/27/2023 Name: Kelsey Carter MRN: [id number] DOB: [birth year]  Kelsey Carter is a 77 y.o. year old female who sees Tracie Harrier, MD for primary care. I spoke with  [name] by phone today.  What matters to the patients health and wellness today?  Recovering from falls, recently fell again on 2/5.     Goals Addressed             This Visit's Progress    No recurrent falls   Not on track    Care Coordination Interventions: Provided written and verbal education re: potential causes of falls and Fall prevention strategies Advised patient of importance of notifying provider of falls Assessed for falls since last encounter Assessed patients knowledge of fall risk prevention secondary to previously provided education Had a fall earlier this week, seen in the ED, no broken bones.  State she didn't use walker when she got up to use the bathroom, reminded to always use DME to decrease risk of fall Has appointment with surgeon in the next 2 weeks Confirmed she has home health for PT sessions still active         SDOH assessments and interventions completed:  No     Care Coordination Interventions:  Yes, provided   Follow up plan: Follow up call scheduled for 3/6    Encounter Outcome:  Pt. Visit Completed   Valente David, RN, MSN, [place] Care Management Care Management Coordinator [phone]

## 2023-01-27 NOTE — Patient Instructions (Signed)
Visit Information  Thank you for taking time to visit with me today. Please don't hesitate to contact me if I can be of assistance to you before our next scheduled telephone appointment.  Following are the goals we discussed today:  Use DME when ambulating.  Our next appointment is by telephone on 3/6  Please call the care guide team at [phone] if you need to cancel or reschedule your appointment.   Please call the Suicide and Crisis Lifeline: 988 call the [place] National Suicide Prevention Lifeline: (612)882-1042 or TTY: 331 304 5143 TTY (620)261-5553) to talk to a trained counselor call 1-800-273-TALK (toll free, 24 hour hotline) call 911 if you are experiencing a Mental Health or [place] or need someone to talk to.  Patient verbalizes understanding of instructions and care plan provided today and agrees to view in [place]. Active MyChart status and patient understanding of how to access instructions and care plan via MyChart confirmed with patient.     The patient has been provided with contact information for the care management team and has been advised to call with any health related questions or concerns.   Valente David, RN, MSN, [place] Care Management Care Management Coordinator [phone]

## 2023-01-29 DIAGNOSIS — N1832 Chronic kidney disease, stage 3b: Secondary | ICD-10-CM | POA: Diagnosis not present

## 2023-01-29 DIAGNOSIS — I482 Chronic atrial fibrillation, unspecified: Secondary | ICD-10-CM | POA: Diagnosis not present

## 2023-01-29 DIAGNOSIS — I10 Essential (primary) hypertension: Secondary | ICD-10-CM | POA: Diagnosis not present

## 2023-01-29 DIAGNOSIS — I2510 Atherosclerotic heart disease of native coronary artery without angina pectoris: Secondary | ICD-10-CM | POA: Diagnosis not present

## 2023-02-02 ENCOUNTER — Telehealth: Payer: Self-pay

## 2023-02-02 NOTE — Telephone Encounter (Signed)
     Patient  visit on 2/5  at [facility]    Have you been able to follow up with your primary care physician? Yes   The patient was or was not able to obtain any needed medicine or equipment. Yes    Are there diet recommendations that you are having difficulty following? Na   Patient expresses understanding of discharge instructions and education provided has no other needs at this time.  Yes     [facility] [phone] [address], [place], [place] [address] Phone: [phone] Email: [email or web address]

## 2023-02-16 DIAGNOSIS — S72001A Fracture of unspecified part of neck of right femur, initial encounter for closed fracture: Secondary | ICD-10-CM | POA: Diagnosis not present

## 2023-02-22 DIAGNOSIS — E785 Hyperlipidemia, unspecified: Secondary | ICD-10-CM | POA: Diagnosis not present

## 2023-02-22 DIAGNOSIS — I119 Hypertensive heart disease without heart failure: Secondary | ICD-10-CM | POA: Diagnosis not present

## 2023-02-22 DIAGNOSIS — I427 Cardiomyopathy due to drug and external agent: Secondary | ICD-10-CM | POA: Diagnosis not present

## 2023-02-22 DIAGNOSIS — I2510 Atherosclerotic heart disease of native coronary artery without angina pectoris: Secondary | ICD-10-CM | POA: Diagnosis not present

## 2023-02-22 DIAGNOSIS — G609 Hereditary and idiopathic neuropathy, unspecified: Secondary | ICD-10-CM | POA: Diagnosis not present

## 2023-02-22 DIAGNOSIS — I480 Paroxysmal atrial fibrillation: Secondary | ICD-10-CM | POA: Diagnosis not present

## 2023-02-22 DIAGNOSIS — G8929 Other chronic pain: Secondary | ICD-10-CM | POA: Diagnosis not present

## 2023-02-22 DIAGNOSIS — Z7901 Long term (current) use of anticoagulants: Secondary | ICD-10-CM | POA: Diagnosis not present

## 2023-02-22 DIAGNOSIS — S72001D Fracture of unspecified part of neck of right femur, subsequent encounter for closed fracture with routine healing: Secondary | ICD-10-CM | POA: Diagnosis not present

## 2023-02-22 DIAGNOSIS — W19XXXD Unspecified fall, subsequent encounter: Secondary | ICD-10-CM | POA: Diagnosis not present

## 2023-02-24 ENCOUNTER — Ambulatory Visit: Payer: Self-pay | Admitting: *Deleted

## 2023-02-24 NOTE — Patient Outreach (Signed)
  Care Coordination   Follow Up Visit Note   02/24/2023 Name: Kelsey Carter MRN: [id number] DOB: 1946/12/16  Kelsey Carter is a 77 y.o. year old female who sees Tracie Harrier, MD for primary care. I spoke with  [name] by phone today.  What matters to the patients health and wellness today?  Ongoing strength building.  Denies any urgent concerns, encouraged to contact this care manager with questions.  Active with CCM team at [facility].     Goals Addressed             This Visit's Progress    COMPLETED: No recurrent falls   On track    Care Coordination Interventions: Provided written and verbal education re: potential causes of falls and Fall prevention strategies Advised patient of importance of notifying provider of falls Assessed for falls since last encounter Assessed patients knowledge of fall risk prevention secondary to previously provided education Has appointment with PCP on 3/28 Confirmed she has home health for PT sessions still active, will have outpatient PT once [facility] sessions are completed.          SDOH assessments and interventions completed:  No     Care Coordination Interventions:  Yes, provided   Interventions Today    Flowsheet Row Most Recent Value  Chronic Disease   Chronic disease during today's visit Other  [hip fracture]  General Interventions   General Interventions Discussed/Reviewed General Interventions Reviewed, Doctor Visits  Doctor Visits Discussed/Reviewed Doctor Visits Reviewed, Specialist  PCP/Specialist Visits Compliance with follow-up visit       Follow up plan: No further intervention required.   Encounter Outcome:  Pt. Visit Completed   Valente David, RN, MSN, [place] Care Management Care Management Coordinator [phone]

## 2023-03-18 DIAGNOSIS — M810 Age-related osteoporosis without current pathological fracture: Secondary | ICD-10-CM | POA: Diagnosis not present

## 2023-03-18 DIAGNOSIS — I10 Essential (primary) hypertension: Secondary | ICD-10-CM | POA: Diagnosis not present

## 2023-03-18 DIAGNOSIS — Z09 Encounter for follow-up examination after completed treatment for conditions other than malignant neoplasm: Secondary | ICD-10-CM | POA: Diagnosis not present

## 2023-03-18 DIAGNOSIS — D649 Anemia, unspecified: Secondary | ICD-10-CM | POA: Diagnosis not present

## 2023-03-18 DIAGNOSIS — Z9889 Other specified postprocedural states: Secondary | ICD-10-CM | POA: Diagnosis not present

## 2023-03-18 DIAGNOSIS — Z8781 Personal history of (healed) traumatic fracture: Secondary | ICD-10-CM | POA: Diagnosis not present

## 2023-03-18 DIAGNOSIS — I480 Paroxysmal atrial fibrillation: Secondary | ICD-10-CM | POA: Diagnosis not present

## 2023-03-18 DIAGNOSIS — S22000A Wedge compression fracture of unspecified thoracic vertebra, initial encounter for closed fracture: Secondary | ICD-10-CM | POA: Diagnosis not present

## 2023-03-18 DIAGNOSIS — G4709 Other insomnia: Secondary | ICD-10-CM | POA: Diagnosis not present

## 2023-04-13 DIAGNOSIS — M25551 Pain in right hip: Secondary | ICD-10-CM | POA: Diagnosis not present

## 2023-04-13 DIAGNOSIS — M2041 Other hammer toe(s) (acquired), right foot: Secondary | ICD-10-CM | POA: Diagnosis not present

## 2023-04-13 DIAGNOSIS — B351 Tinea unguium: Secondary | ICD-10-CM | POA: Diagnosis not present

## 2023-04-13 DIAGNOSIS — L600 Ingrowing nail: Secondary | ICD-10-CM | POA: Diagnosis not present

## 2023-04-13 DIAGNOSIS — R7303 Prediabetes: Secondary | ICD-10-CM | POA: Diagnosis not present

## 2023-04-13 DIAGNOSIS — M778 Other enthesopathies, not elsewhere classified: Secondary | ICD-10-CM | POA: Diagnosis not present

## 2023-04-13 DIAGNOSIS — G620 Drug-induced polyneuropathy: Secondary | ICD-10-CM | POA: Diagnosis not present

## 2023-04-13 DIAGNOSIS — M25651 Stiffness of right hip, not elsewhere classified: Secondary | ICD-10-CM | POA: Diagnosis not present

## 2023-04-13 DIAGNOSIS — M2042 Other hammer toe(s) (acquired), left foot: Secondary | ICD-10-CM | POA: Diagnosis not present

## 2023-04-13 DIAGNOSIS — T451X5A Adverse effect of antineoplastic and immunosuppressive drugs, initial encounter: Secondary | ICD-10-CM | POA: Diagnosis not present

## 2023-04-19 DIAGNOSIS — M25551 Pain in right hip: Secondary | ICD-10-CM | POA: Diagnosis not present

## 2023-04-19 DIAGNOSIS — M25651 Stiffness of right hip, not elsewhere classified: Secondary | ICD-10-CM | POA: Diagnosis not present

## 2023-04-22 ENCOUNTER — Ambulatory Visit: Admit: 2023-04-22 | Discharge: 2023-04-23 | Payer: PRIVATE HEALTH INSURANCE

## 2023-04-22 DIAGNOSIS — C029 Malignant neoplasm of tongue, unspecified: Principal | ICD-10-CM

## 2023-04-22 DIAGNOSIS — K148 Other diseases of tongue: Principal | ICD-10-CM

## 2023-04-28 DIAGNOSIS — M25651 Stiffness of right hip, not elsewhere classified: Secondary | ICD-10-CM | POA: Diagnosis not present

## 2023-04-28 DIAGNOSIS — M25551 Pain in right hip: Secondary | ICD-10-CM | POA: Diagnosis not present

## 2023-04-30 DIAGNOSIS — M25651 Stiffness of right hip, not elsewhere classified: Secondary | ICD-10-CM | POA: Diagnosis not present

## 2023-04-30 DIAGNOSIS — M25551 Pain in right hip: Secondary | ICD-10-CM | POA: Diagnosis not present

## 2023-05-04 DIAGNOSIS — M25551 Pain in right hip: Secondary | ICD-10-CM | POA: Diagnosis not present

## 2023-05-04 DIAGNOSIS — M25651 Stiffness of right hip, not elsewhere classified: Secondary | ICD-10-CM | POA: Diagnosis not present

## 2023-05-06 DIAGNOSIS — M25551 Pain in right hip: Secondary | ICD-10-CM | POA: Diagnosis not present

## 2023-05-06 DIAGNOSIS — M25651 Stiffness of right hip, not elsewhere classified: Secondary | ICD-10-CM | POA: Diagnosis not present

## 2023-05-10 DIAGNOSIS — M25651 Stiffness of right hip, not elsewhere classified: Secondary | ICD-10-CM | POA: Diagnosis not present

## 2023-05-10 DIAGNOSIS — M25551 Pain in right hip: Secondary | ICD-10-CM | POA: Diagnosis not present

## 2023-05-12 DIAGNOSIS — M25651 Stiffness of right hip, not elsewhere classified: Secondary | ICD-10-CM | POA: Diagnosis not present

## 2023-05-12 DIAGNOSIS — M25551 Pain in right hip: Secondary | ICD-10-CM | POA: Diagnosis not present

## 2023-05-18 DIAGNOSIS — M25651 Stiffness of right hip, not elsewhere classified: Secondary | ICD-10-CM | POA: Diagnosis not present

## 2023-05-18 DIAGNOSIS — M25551 Pain in right hip: Secondary | ICD-10-CM | POA: Diagnosis not present

## 2023-05-20 DIAGNOSIS — M25651 Stiffness of right hip, not elsewhere classified: Secondary | ICD-10-CM | POA: Diagnosis not present

## 2023-05-20 DIAGNOSIS — M25551 Pain in right hip: Secondary | ICD-10-CM | POA: Diagnosis not present

## 2023-06-04 DIAGNOSIS — M25551 Pain in right hip: Secondary | ICD-10-CM | POA: Diagnosis not present

## 2023-06-04 DIAGNOSIS — M25651 Stiffness of right hip, not elsewhere classified: Secondary | ICD-10-CM | POA: Diagnosis not present

## 2023-06-09 DIAGNOSIS — M25551 Pain in right hip: Secondary | ICD-10-CM | POA: Diagnosis not present

## 2023-06-09 DIAGNOSIS — M25651 Stiffness of right hip, not elsewhere classified: Secondary | ICD-10-CM | POA: Diagnosis not present

## 2023-06-16 DIAGNOSIS — D649 Anemia, unspecified: Secondary | ICD-10-CM | POA: Diagnosis not present

## 2023-06-16 DIAGNOSIS — D6489 Other specified anemias: Secondary | ICD-10-CM | POA: Diagnosis not present

## 2023-06-16 DIAGNOSIS — M25551 Pain in right hip: Secondary | ICD-10-CM | POA: Diagnosis not present

## 2023-06-16 DIAGNOSIS — I10 Essential (primary) hypertension: Secondary | ICD-10-CM | POA: Diagnosis not present

## 2023-06-16 DIAGNOSIS — M25651 Stiffness of right hip, not elsewhere classified: Secondary | ICD-10-CM | POA: Diagnosis not present

## 2023-06-17 DIAGNOSIS — R8290 Unspecified abnormal findings in urine: Secondary | ICD-10-CM | POA: Diagnosis not present

## 2023-06-17 DIAGNOSIS — D649 Anemia, unspecified: Secondary | ICD-10-CM | POA: Diagnosis not present

## 2023-06-17 DIAGNOSIS — I10 Essential (primary) hypertension: Secondary | ICD-10-CM | POA: Diagnosis not present

## 2023-06-21 DIAGNOSIS — M25651 Stiffness of right hip, not elsewhere classified: Secondary | ICD-10-CM | POA: Diagnosis not present

## 2023-06-21 DIAGNOSIS — M25551 Pain in right hip: Secondary | ICD-10-CM | POA: Diagnosis not present

## 2023-06-23 DIAGNOSIS — I480 Paroxysmal atrial fibrillation: Secondary | ICD-10-CM | POA: Diagnosis not present

## 2023-06-23 DIAGNOSIS — M25651 Stiffness of right hip, not elsewhere classified: Secondary | ICD-10-CM | POA: Diagnosis not present

## 2023-06-23 DIAGNOSIS — Z8781 Personal history of (healed) traumatic fracture: Secondary | ICD-10-CM | POA: Diagnosis not present

## 2023-06-23 DIAGNOSIS — Z0000 Encounter for general adult medical examination without abnormal findings: Secondary | ICD-10-CM | POA: Diagnosis not present

## 2023-06-23 DIAGNOSIS — M5136 Other intervertebral disc degeneration, lumbar region: Secondary | ICD-10-CM | POA: Diagnosis not present

## 2023-06-23 DIAGNOSIS — R413 Other amnesia: Secondary | ICD-10-CM | POA: Diagnosis not present

## 2023-06-23 DIAGNOSIS — M25551 Pain in right hip: Secondary | ICD-10-CM | POA: Diagnosis not present

## 2023-06-23 DIAGNOSIS — C50912 Malignant neoplasm of unspecified site of left female breast: Secondary | ICD-10-CM | POA: Diagnosis not present

## 2023-06-23 DIAGNOSIS — D649 Anemia, unspecified: Secondary | ICD-10-CM | POA: Diagnosis not present

## 2023-06-23 DIAGNOSIS — Z9013 Acquired absence of bilateral breasts and nipples: Secondary | ICD-10-CM | POA: Diagnosis not present

## 2023-06-23 DIAGNOSIS — C50911 Malignant neoplasm of unspecified site of right female breast: Secondary | ICD-10-CM | POA: Diagnosis not present

## 2023-06-23 DIAGNOSIS — Z171 Estrogen receptor negative status [ER-]: Secondary | ICD-10-CM | POA: Diagnosis not present

## 2023-06-28 DIAGNOSIS — M25551 Pain in right hip: Secondary | ICD-10-CM | POA: Diagnosis not present

## 2023-06-28 DIAGNOSIS — M25651 Stiffness of right hip, not elsewhere classified: Secondary | ICD-10-CM | POA: Diagnosis not present

## 2023-07-07 DIAGNOSIS — I10 Essential (primary) hypertension: Secondary | ICD-10-CM | POA: Diagnosis not present

## 2023-07-07 DIAGNOSIS — I480 Paroxysmal atrial fibrillation: Secondary | ICD-10-CM | POA: Diagnosis not present

## 2023-07-07 DIAGNOSIS — Z79899 Other long term (current) drug therapy: Secondary | ICD-10-CM | POA: Diagnosis not present

## 2023-07-07 DIAGNOSIS — E782 Mixed hyperlipidemia: Secondary | ICD-10-CM | POA: Diagnosis not present

## 2023-07-20 DIAGNOSIS — I480 Paroxysmal atrial fibrillation: Secondary | ICD-10-CM | POA: Diagnosis not present

## 2023-07-22 ENCOUNTER — Ambulatory Visit: Admit: 2023-07-22 | Discharge: 2023-07-23 | Payer: PRIVATE HEALTH INSURANCE

## 2023-07-22 DIAGNOSIS — C029 Malignant neoplasm of tongue, unspecified: Principal | ICD-10-CM

## 2023-08-18 DIAGNOSIS — I2510 Atherosclerotic heart disease of native coronary artery without angina pectoris: Secondary | ICD-10-CM | POA: Diagnosis not present

## 2023-08-18 DIAGNOSIS — I5020 Unspecified systolic (congestive) heart failure: Secondary | ICD-10-CM | POA: Diagnosis not present

## 2023-08-18 DIAGNOSIS — I10 Essential (primary) hypertension: Secondary | ICD-10-CM | POA: Diagnosis not present

## 2023-08-18 DIAGNOSIS — I480 Paroxysmal atrial fibrillation: Secondary | ICD-10-CM | POA: Diagnosis not present

## 2023-08-24 DIAGNOSIS — I428 Other cardiomyopathies: Secondary | ICD-10-CM | POA: Diagnosis not present

## 2023-08-24 DIAGNOSIS — I340 Nonrheumatic mitral (valve) insufficiency: Secondary | ICD-10-CM | POA: Diagnosis not present

## 2023-08-24 DIAGNOSIS — D649 Anemia, unspecified: Secondary | ICD-10-CM | POA: Diagnosis not present

## 2023-08-24 DIAGNOSIS — J209 Acute bronchitis, unspecified: Secondary | ICD-10-CM | POA: Diagnosis not present

## 2023-08-24 DIAGNOSIS — R5383 Other fatigue: Secondary | ICD-10-CM | POA: Diagnosis not present

## 2023-08-24 DIAGNOSIS — R5381 Other malaise: Secondary | ICD-10-CM | POA: Diagnosis not present

## 2023-09-07 DIAGNOSIS — J209 Acute bronchitis, unspecified: Secondary | ICD-10-CM | POA: Diagnosis not present

## 2023-09-07 DIAGNOSIS — R0609 Other forms of dyspnea: Secondary | ICD-10-CM | POA: Diagnosis not present

## 2023-09-07 DIAGNOSIS — D649 Anemia, unspecified: Secondary | ICD-10-CM | POA: Diagnosis not present

## 2023-09-07 DIAGNOSIS — I480 Paroxysmal atrial fibrillation: Secondary | ICD-10-CM | POA: Diagnosis not present

## 2023-09-07 DIAGNOSIS — I428 Other cardiomyopathies: Secondary | ICD-10-CM | POA: Diagnosis not present

## 2023-09-15 DIAGNOSIS — I2510 Atherosclerotic heart disease of native coronary artery without angina pectoris: Secondary | ICD-10-CM | POA: Diagnosis not present

## 2023-09-15 DIAGNOSIS — I10 Essential (primary) hypertension: Secondary | ICD-10-CM | POA: Diagnosis not present

## 2023-09-15 DIAGNOSIS — I5020 Unspecified systolic (congestive) heart failure: Secondary | ICD-10-CM | POA: Diagnosis not present

## 2023-09-15 DIAGNOSIS — E782 Mixed hyperlipidemia: Secondary | ICD-10-CM | POA: Diagnosis not present

## 2023-10-06 DIAGNOSIS — B9689 Other specified bacterial agents as the cause of diseases classified elsewhere: Secondary | ICD-10-CM | POA: Diagnosis not present

## 2023-10-06 DIAGNOSIS — J209 Acute bronchitis, unspecified: Secondary | ICD-10-CM | POA: Diagnosis not present

## 2023-10-06 DIAGNOSIS — J019 Acute sinusitis, unspecified: Secondary | ICD-10-CM | POA: Diagnosis not present

## 2023-10-12 DIAGNOSIS — G620 Drug-induced polyneuropathy: Secondary | ICD-10-CM | POA: Diagnosis not present

## 2023-10-12 DIAGNOSIS — J209 Acute bronchitis, unspecified: Secondary | ICD-10-CM | POA: Diagnosis not present

## 2023-10-12 DIAGNOSIS — Z9889 Other specified postprocedural states: Secondary | ICD-10-CM | POA: Diagnosis not present

## 2023-10-12 DIAGNOSIS — I480 Paroxysmal atrial fibrillation: Secondary | ICD-10-CM | POA: Diagnosis not present

## 2023-10-12 DIAGNOSIS — J208 Acute bronchitis due to other specified organisms: Secondary | ICD-10-CM | POA: Diagnosis not present

## 2023-10-12 DIAGNOSIS — R053 Chronic cough: Secondary | ICD-10-CM | POA: Diagnosis not present

## 2023-10-12 DIAGNOSIS — I771 Stricture of artery: Secondary | ICD-10-CM | POA: Diagnosis not present

## 2023-10-12 DIAGNOSIS — T451X5A Adverse effect of antineoplastic and immunosuppressive drugs, initial encounter: Secondary | ICD-10-CM | POA: Diagnosis not present

## 2023-10-12 DIAGNOSIS — Z9013 Acquired absence of bilateral breasts and nipples: Secondary | ICD-10-CM | POA: Diagnosis not present

## 2023-11-02 DIAGNOSIS — M51369 Other intervertebral disc degeneration, lumbar region without mention of lumbar back pain or lower extremity pain: Secondary | ICD-10-CM | POA: Diagnosis not present

## 2023-11-02 DIAGNOSIS — Z171 Estrogen receptor negative status [ER-]: Secondary | ICD-10-CM | POA: Diagnosis not present

## 2023-11-02 DIAGNOSIS — C50911 Malignant neoplasm of unspecified site of right female breast: Secondary | ICD-10-CM | POA: Diagnosis not present

## 2023-11-02 DIAGNOSIS — R413 Other amnesia: Secondary | ICD-10-CM | POA: Diagnosis not present

## 2023-11-02 DIAGNOSIS — C50912 Malignant neoplasm of unspecified site of left female breast: Secondary | ICD-10-CM | POA: Diagnosis not present

## 2023-11-02 DIAGNOSIS — I10 Essential (primary) hypertension: Secondary | ICD-10-CM | POA: Diagnosis not present

## 2023-11-02 DIAGNOSIS — I480 Paroxysmal atrial fibrillation: Secondary | ICD-10-CM | POA: Diagnosis not present

## 2023-11-02 DIAGNOSIS — Z9013 Acquired absence of bilateral breasts and nipples: Secondary | ICD-10-CM | POA: Diagnosis not present

## 2023-11-02 DIAGNOSIS — Z0000 Encounter for general adult medical examination without abnormal findings: Secondary | ICD-10-CM | POA: Diagnosis not present

## 2023-11-02 DIAGNOSIS — D649 Anemia, unspecified: Secondary | ICD-10-CM | POA: Diagnosis not present

## 2023-11-03 DIAGNOSIS — I480 Paroxysmal atrial fibrillation: Secondary | ICD-10-CM | POA: Diagnosis not present

## 2023-11-03 DIAGNOSIS — C50911 Malignant neoplasm of unspecified site of right female breast: Secondary | ICD-10-CM | POA: Diagnosis not present

## 2023-11-03 DIAGNOSIS — Z171 Estrogen receptor negative status [ER-]: Secondary | ICD-10-CM | POA: Diagnosis not present

## 2023-11-03 DIAGNOSIS — Z9013 Acquired absence of bilateral breasts and nipples: Secondary | ICD-10-CM | POA: Diagnosis not present

## 2023-11-03 DIAGNOSIS — R413 Other amnesia: Secondary | ICD-10-CM | POA: Diagnosis not present

## 2023-11-03 DIAGNOSIS — Z0000 Encounter for general adult medical examination without abnormal findings: Secondary | ICD-10-CM | POA: Diagnosis not present

## 2023-11-03 DIAGNOSIS — M51369 Other intervertebral disc degeneration, lumbar region without mention of lumbar back pain or lower extremity pain: Secondary | ICD-10-CM | POA: Diagnosis not present

## 2023-11-03 DIAGNOSIS — D649 Anemia, unspecified: Secondary | ICD-10-CM | POA: Diagnosis not present

## 2023-11-03 DIAGNOSIS — I10 Essential (primary) hypertension: Secondary | ICD-10-CM | POA: Diagnosis not present

## 2023-11-03 DIAGNOSIS — C50912 Malignant neoplasm of unspecified site of left female breast: Secondary | ICD-10-CM | POA: Diagnosis not present

## 2023-11-08 DIAGNOSIS — I10 Essential (primary) hypertension: Secondary | ICD-10-CM | POA: Diagnosis not present

## 2023-11-08 DIAGNOSIS — C029 Malignant neoplasm of tongue, unspecified: Secondary | ICD-10-CM | POA: Diagnosis not present

## 2023-11-08 DIAGNOSIS — I2510 Atherosclerotic heart disease of native coronary artery without angina pectoris: Secondary | ICD-10-CM | POA: Diagnosis not present

## 2023-11-08 DIAGNOSIS — I480 Paroxysmal atrial fibrillation: Secondary | ICD-10-CM | POA: Diagnosis not present

## 2023-11-08 DIAGNOSIS — R7989 Other specified abnormal findings of blood chemistry: Secondary | ICD-10-CM | POA: Diagnosis not present

## 2023-11-08 DIAGNOSIS — Z9889 Other specified postprocedural states: Secondary | ICD-10-CM | POA: Diagnosis not present

## 2023-11-08 DIAGNOSIS — Z853 Personal history of malignant neoplasm of breast: Secondary | ICD-10-CM | POA: Diagnosis not present

## 2023-11-08 DIAGNOSIS — T451X5A Adverse effect of antineoplastic and immunosuppressive drugs, initial encounter: Secondary | ICD-10-CM | POA: Diagnosis not present

## 2023-11-08 DIAGNOSIS — Z9013 Acquired absence of bilateral breasts and nipples: Secondary | ICD-10-CM | POA: Diagnosis not present

## 2023-11-08 DIAGNOSIS — Z2821 Immunization not carried out because of patient refusal: Secondary | ICD-10-CM | POA: Diagnosis not present

## 2023-11-08 DIAGNOSIS — D649 Anemia, unspecified: Secondary | ICD-10-CM | POA: Diagnosis not present

## 2023-11-08 DIAGNOSIS — G620 Drug-induced polyneuropathy: Secondary | ICD-10-CM | POA: Diagnosis not present

## 2023-11-15 DIAGNOSIS — J40 Bronchitis, not specified as acute or chronic: Secondary | ICD-10-CM | POA: Diagnosis not present

## 2023-11-23 DIAGNOSIS — M4726 Other spondylosis with radiculopathy, lumbar region: Secondary | ICD-10-CM | POA: Diagnosis not present

## 2023-11-23 DIAGNOSIS — M79651 Pain in right thigh: Secondary | ICD-10-CM | POA: Diagnosis not present

## 2023-11-23 DIAGNOSIS — M25551 Pain in right hip: Secondary | ICD-10-CM | POA: Diagnosis not present

## 2023-11-24 DIAGNOSIS — I5020 Unspecified systolic (congestive) heart failure: Secondary | ICD-10-CM | POA: Diagnosis not present

## 2023-11-24 DIAGNOSIS — I2510 Atherosclerotic heart disease of native coronary artery without angina pectoris: Secondary | ICD-10-CM | POA: Diagnosis not present

## 2023-11-25 ENCOUNTER — Ambulatory Visit: Admit: 2023-11-25 | Discharge: 2023-11-26 | Payer: PRIVATE HEALTH INSURANCE

## 2023-11-25 DIAGNOSIS — C029 Malignant neoplasm of tongue, unspecified: Principal | ICD-10-CM

## 2023-12-03 DIAGNOSIS — M25551 Pain in right hip: Secondary | ICD-10-CM | POA: Diagnosis not present

## 2023-12-03 DIAGNOSIS — M545 Low back pain, unspecified: Secondary | ICD-10-CM | POA: Diagnosis not present

## 2023-12-13 ENCOUNTER — Emergency Department
Admission: EM | Admit: 2023-12-13 | Discharge: 2023-12-13 | Disposition: A | Discharge status: Home / Self Care | Payer: PPO | Attending: Emergency Medicine | Admitting: Emergency Medicine

## 2023-12-13 ENCOUNTER — Other Ambulatory Visit: Payer: Self-pay

## 2023-12-13 ENCOUNTER — Emergency Department: Payer: PPO

## 2023-12-13 DIAGNOSIS — W19XXXA Unspecified fall, initial encounter: Secondary | ICD-10-CM | POA: Insufficient documentation

## 2023-12-13 DIAGNOSIS — M19012 Primary osteoarthritis, left shoulder: Secondary | ICD-10-CM | POA: Diagnosis not present

## 2023-12-13 DIAGNOSIS — M25512 Pain in left shoulder: Secondary | ICD-10-CM

## 2023-12-13 DIAGNOSIS — S4992XA Unspecified injury of left shoulder and upper arm, initial encounter: Secondary | ICD-10-CM | POA: Diagnosis not present

## 2023-12-13 DIAGNOSIS — I2510 Atherosclerotic heart disease of native coronary artery without angina pectoris: Secondary | ICD-10-CM | POA: Insufficient documentation

## 2023-12-13 DIAGNOSIS — I10 Essential (primary) hypertension: Secondary | ICD-10-CM | POA: Diagnosis not present

## 2023-12-13 MED ORDER — IBUPROFEN 400 MG PO TABS: 400.0000 mg | ORAL_TABLET | Freq: Three times a day (TID) | ORAL | 0 refills | Status: AC | PRN

## 2023-12-13 NOTE — Discharge Instructions (Signed)
You were evaluated in the ED for left shoulder pain.  Your x-rays reveal no acute abnormalities such as fracture or dislocation.  We have placed you in a sling to wear for comfort.  3 times a day please remove the sling and perform range of motion exercises.  Take ibuprofen for pain as needed.  Get plenty of rest.  Ice the affected area for 20 minutes on 20 minutes off.  Keep shoulder elevated above heart level.  Please follow-up with orthopedic if symptoms do not improve in 1 week.

## 2023-12-13 NOTE — ED Triage Notes (Signed)
Pt to ED For left shoulder pain after fall last week.

## 2023-12-13 NOTE — ED Provider Notes (Signed)
[facility] Emergency Department Provider Note     Event Date/Time   First MD Initiated Contact with Patient 12/13/23 [id number]     (approximate)   History   Shoulder Pain   HPI  Kelsey Carter is a 77 y.o. female with a history HTN, CAD and neuropathy presents to the ED with complaint of left shoulder pain following a fall last week.  Patient reports she tripped onto outstretched left arm.  She did not initially feel pain but over the days pain has been increasing.  She reports today she was unable to move her arm due to pain in which she seeks evaluation in the ED.     Physical Exam   Triage Vital Signs: ED Triage Vitals  Encounter Vitals Group     BP 12/13/23 0711 (!) 169/100     Systolic BP Percentile --      Diastolic BP Percentile --      Pulse Rate 12/13/23 0711 86     Resp 12/13/23 0710 18     Temp 12/13/23 0710 98.4 F (36.9 C)     Temp src --      SpO2 12/13/23 0711 100 %     Weight 12/13/23 0711 116 lb (52.6 kg)     Height 12/13/23 0711 5\' 5"  (1.651 m)     Head Circumference --      Peak Flow --      Pain Score 12/13/23 0711 8     Pain Loc --      Pain Education --      Exclude from Growth Chart --     Most recent vital signs: Vitals:   12/13/23 0710 12/13/23 0711  BP:  (!) 169/100  Pulse:  86  Resp: 18   Temp: 98.4 F (36.9 C)   SpO2:  100%    General Awake, no distress.  HEENT NCAT. PERRL. EOMI. No rhinorrhea. Mucous membranes are moist.  CV:  Good peripheral perfusion.  RESP:  Normal effort.  ABD:  No distention.  Other:  No visible deformity of the left shoulder.  No crepitus along clavicle.  Full active range of motion.  Neurovascular status intact all throughout.  Brisk capillary refills.   ED Results / Procedures / Treatments   Labs (all labs ordered are listed, but only abnormal results are displayed) Labs Reviewed - No data to display  RADIOLOGY  I personally viewed and evaluated these images as part  of my medical decision making, as well as reviewing the written report by the radiologist.  ED Provider Interpretation: No acute bony abnormality noted.  DG Shoulder Left Result Date: 12/13/2023 CLINICAL DATA:  77 year old female history of trauma from a fall a few days ago complaining of left shoulder pain. EXAM: LEFT SHOULDER - 2+ VIEW COMPARISON:  No priors. FINDINGS: Three views of the left shoulder demonstrate no acute displaced fracture, subluxation or dislocation. There is some joint space narrowing, subchondral sclerosis and osteophyte formation associated with the acromioclavicular and glenohumeral joints, indicative of osteoarthritis. IMPRESSION: 1. No acute radiographic abnormality of the left shoulder. 2. Osteoarthritis in the left acromioclavicular and glenohumeral joints. Electronically Signed   By: Trudie Reed M.D.   On: 12/13/2023 07:46    PROCEDURES:  Critical Care performed: No  Procedures   MEDICATIONS ORDERED IN ED: Medications - No data to display   IMPRESSION / MDM / ASSESSMENT AND PLAN / ED COURSE  I reviewed the triage vital signs and the  nursing notes.                               77 y.o. female presents to the emergency department for evaluation and treatment of left shoulder pain. See HPI for further details.   Differential diagnosis includes, but is not limited to fracture, dislocation, strain, arthritis  Patient's presentation is most consistent with acute complicated illness / injury requiring diagnostic workup.  Patient is alert and oriented.  Her physical exam findings are overall benign stated above.  There is good range of motion and no visible deformity to note dislocation.  X-ray is reassuring of acute bony abnormalities.  Patient will be placed in sling.  She currently denies pain medication in the ED.  She is encouraged to wear sling for comfort and perform range of motion exercises at home to prevent stiffness.  I encouraged her to follow-up  with orthopedic in 1 week if symptoms not improved.  Patient verbalized understanding.  She is in stable and satisfactory condition for discharge home.  ED precautions discussed.  FINAL CLINICAL IMPRESSION(S) / ED DIAGNOSES   Final diagnoses:  Acute pain of left shoulder    Rx / DC Orders   ED Discharge Orders          Ordered    ibuprofen (ADVIL) 400 MG tablet  Every 8 hours PRN        12/13/23 0805            Note:  This document was prepared using Dragon voice recognition software and may include unintentional dictation errors.    Romeo Apple, Angelin Cutrone A, PA-C 12/13/23 5366    Jene Every, MD 12/13/23 [phone]

## 2023-12-13 NOTE — ED Notes (Signed)
See triage note  Presents s/p fall a few days ago  States she fall a lot d/t neuropathy  States she put her left arm out  when she fell  Developed pain to shoulder area   No deformity noted

## 2023-12-20 DIAGNOSIS — I5020 Unspecified systolic (congestive) heart failure: Secondary | ICD-10-CM | POA: Diagnosis not present

## 2023-12-20 DIAGNOSIS — I10 Essential (primary) hypertension: Secondary | ICD-10-CM | POA: Diagnosis not present

## 2023-12-20 DIAGNOSIS — E782 Mixed hyperlipidemia: Secondary | ICD-10-CM | POA: Diagnosis not present

## 2023-12-20 DIAGNOSIS — I480 Paroxysmal atrial fibrillation: Secondary | ICD-10-CM | POA: Diagnosis not present

## 2024-01-19 DIAGNOSIS — M7989 Other specified soft tissue disorders: Secondary | ICD-10-CM | POA: Diagnosis not present

## 2024-01-19 DIAGNOSIS — C029 Malignant neoplasm of tongue, unspecified: Secondary | ICD-10-CM | POA: Diagnosis not present

## 2024-01-19 DIAGNOSIS — Z9889 Other specified postprocedural states: Secondary | ICD-10-CM | POA: Diagnosis not present

## 2024-01-19 DIAGNOSIS — I10 Essential (primary) hypertension: Secondary | ICD-10-CM | POA: Diagnosis not present

## 2024-01-19 DIAGNOSIS — Z9181 History of falling: Secondary | ICD-10-CM | POA: Diagnosis not present

## 2024-01-19 DIAGNOSIS — I5020 Unspecified systolic (congestive) heart failure: Secondary | ICD-10-CM | POA: Diagnosis not present

## 2024-01-19 DIAGNOSIS — I480 Paroxysmal atrial fibrillation: Secondary | ICD-10-CM | POA: Diagnosis not present

## 2024-01-19 DIAGNOSIS — M48062 Spinal stenosis, lumbar region with neurogenic claudication: Secondary | ICD-10-CM | POA: Diagnosis not present

## 2024-01-19 DIAGNOSIS — N1831 Chronic kidney disease, stage 3a: Secondary | ICD-10-CM | POA: Diagnosis not present

## 2024-02-09 ENCOUNTER — Emergency Department
Admission: EM | Admit: 2024-02-09 | Discharge: 2024-02-09 | Disposition: A | Discharge status: Home / Self Care | Payer: PPO | Attending: Emergency Medicine | Admitting: Emergency Medicine

## 2024-02-09 ENCOUNTER — Other Ambulatory Visit: Payer: Self-pay

## 2024-02-09 ENCOUNTER — Emergency Department: Payer: PPO

## 2024-02-09 DIAGNOSIS — Z23 Encounter for immunization: Secondary | ICD-10-CM | POA: Diagnosis not present

## 2024-02-09 DIAGNOSIS — S59911A Unspecified injury of right forearm, initial encounter: Secondary | ICD-10-CM | POA: Diagnosis present

## 2024-02-09 DIAGNOSIS — Z7901 Long term (current) use of anticoagulants: Secondary | ICD-10-CM | POA: Insufficient documentation

## 2024-02-09 DIAGNOSIS — S51831A Puncture wound without foreign body of right forearm, initial encounter: Secondary | ICD-10-CM | POA: Diagnosis not present

## 2024-02-09 DIAGNOSIS — I10 Essential (primary) hypertension: Secondary | ICD-10-CM | POA: Insufficient documentation

## 2024-02-09 DIAGNOSIS — M7989 Other specified soft tissue disorders: Secondary | ICD-10-CM | POA: Diagnosis not present

## 2024-02-09 DIAGNOSIS — W540XXA Bitten by dog, initial encounter: Secondary | ICD-10-CM | POA: Insufficient documentation

## 2024-02-09 DIAGNOSIS — I2510 Atherosclerotic heart disease of native coronary artery without angina pectoris: Secondary | ICD-10-CM | POA: Insufficient documentation

## 2024-02-09 DIAGNOSIS — S51851A Open bite of right forearm, initial encounter: Secondary | ICD-10-CM | POA: Diagnosis not present

## 2024-02-09 MED ORDER — SODIUM CHLORIDE 0.9 % IV BOLUS: 1000.0000 mL | Freq: Once | INTRAVENOUS | Status: DC

## 2024-02-09 MED ORDER — TETANUS-DIPHTH-ACELL PERTUSSIS 5-2.5-18.5 LF-MCG/0.5 IM SUSY
0.5000 mL | PREFILLED_SYRINGE | Freq: Once | INTRAMUSCULAR | Status: AC
  Administered 2024-02-09: 0.5 mL via INTRAMUSCULAR
  Filled 2024-02-09: qty 0.5

## 2024-02-09 MED ORDER — AMOXICILLIN-POT CLAVULANATE 500-125 MG PO TABS: 1.0000 | ORAL_TABLET | Freq: Three times a day (TID) | ORAL | 0 refills | Status: AC

## 2024-02-09 NOTE — ED Notes (Signed)
See triage note  Presents with puncture to right forearm  States she was bitten by a dog that they adopted yesterday

## 2024-02-09 NOTE — ED Triage Notes (Signed)
Pt c/o animal bite to posterior R arm x1 day.  Denies pain.  Swelling noted to R hand.  Pt reports it was a dog she had adopted and was up to date in vaccines.    Pt is on blood thinners.

## 2024-02-09 NOTE — Discharge Instructions (Signed)
Follow-up with your primary care provider if any continued problems.  A prescription for Augmentin 500 mg 3 times daily was sent to the pharmacy for you to take for the next 7 days to prevent infection in this area.  Continue with your regular medication.  After 2 days remove the dressing and clean with mild soap and water and reapply a dressing to your arm.  If you are still bleeding it will need to be a pressure dressing similar to what was placed while in the emergency department.  Elevate your arm as needed for swelling.  Tylenol if needed for pain.

## 2024-02-09 NOTE — ED Provider Notes (Signed)
[facility] Provider Note    Event Date/Time   First MD Initiated Contact with Patient 02/09/24 [phone]     (approximate)   History   Animal Bite and Laceration   HPI  Kelsey Carter is a 78 y.o. female   presents to the ED with complaint of a bite to her right forearm from a puppy that she adopted from the animal shelter.  Puppy is up-to-date on immunizations.  Patient is not aware of when the last time she had a tetanus booster.  Patient is on Eliquis and states that she has had difficulty controlling the bleeding last evening.  Patient has history of hypertension, CAD, A-fib, left bundle branch block, angina, prediabetes, cardiomyopathy due to chemotherapy and degenerative disc disease.      Physical Exam   Triage Vital Signs: ED Triage Vitals  Encounter Vitals Group     BP 02/09/24 0718 (!) 154/71     Systolic BP Percentile --      Diastolic BP Percentile --      Pulse Rate 02/09/24 0718 80     Resp 02/09/24 0718 17     Temp 02/09/24 0718 97.8 F (36.6 C)     Temp Source 02/09/24 0718 Oral     SpO2 02/09/24 0718 98 %     Weight 02/09/24 0719 112 lb (50.8 kg)     Height 02/09/24 0719 5\' 5"  (1.651 m)     Head Circumference --      Peak Flow --      Pain Score 02/09/24 0727 0     Pain Loc --      Pain Education --      Exclude from Growth Chart --     Most recent vital signs: Vitals:   02/09/24 0718  BP: (!) 154/71  Pulse: 80  Resp: 17  Temp: 97.8 F (36.6 C)  SpO2: 98%     General: Awake, no distress.  CV:  Good peripheral perfusion.  Resp:  Normal effort.  Abd:  No distention.  Other:  Right forearm proximately there are 2 puncture wounds present with minimal active bleeding.  Punctures appear to be very superficial.  Patient is able to move digits distally, capillary refills less than 3 seconds, motor or sensory function intact.   ED Results / Procedures / Treatments   Labs (all labs ordered are listed, but only abnormal  results are displayed) Labs Reviewed - No data to display    RADIOLOGY Right forearm x-ray images reviewed and interpreted by myself independent of the radiologist and no opaque foreign bodies are noted.    PROCEDURES:  Critical Care performed:   Procedures   MEDICATIONS ORDERED IN ED: Medications  Tdap (BOOSTRIX) injection 0.5 mL (0.5 mLs Intramuscular Given 02/09/24 0825)     IMPRESSION / MDM / ASSESSMENT AND PLAN / ED COURSE  I reviewed the triage vital signs and the nursing notes.   Differential diagnosis includes, but is not limited to, dog bite, retained foreign body, bony injury secondary to animal bite, tetanus immunization updated.  78 year old female was brought to the ED by family member with complaint of a dog bite to her right forearm that occurred yesterday.  Patient currently is taking Eliquis has been able to get the bleeding to stop.  There was minimal bleeding at the time of her visit and x-rays were reassuring that there was no retained foreign body.  A prescription for Augmentin was sent to the pharmacy for  her to begin taking.  She was given instructions on how to care for the area and continue with her regular medication.  She is to follow-up with her PCP if any continued problems.      Patient's presentation is most consistent with acute, uncomplicated illness.  FINAL CLINICAL IMPRESSION(S) / ED DIAGNOSES   Final diagnoses:  Dog bite, initial encounter     Rx / DC Orders   ED Discharge Orders          Ordered    amoxicillin-clavulanate (AUGMENTIN) 500-125 MG tablet  3 times daily        02/09/24 0902             Note:  This document was prepared using Dragon voice recognition software and may include unintentional dictation errors.   Tommi Rumps, PA-C 02/09/24 1308    Minna Antis, MD 02/09/24 1257

## 2024-02-21 ENCOUNTER — Encounter: Payer: Self-pay | Admitting: *Deleted

## 2024-02-21 ENCOUNTER — Other Ambulatory Visit: Payer: Self-pay

## 2024-02-21 ENCOUNTER — Emergency Department

## 2024-02-21 DIAGNOSIS — S199XXA Unspecified injury of neck, initial encounter: Secondary | ICD-10-CM | POA: Diagnosis not present

## 2024-02-21 DIAGNOSIS — M25552 Pain in left hip: Secondary | ICD-10-CM | POA: Diagnosis not present

## 2024-02-21 DIAGNOSIS — I10 Essential (primary) hypertension: Secondary | ICD-10-CM | POA: Insufficient documentation

## 2024-02-21 DIAGNOSIS — W1831XA Fall on same level due to stepping on an object, initial encounter: Secondary | ICD-10-CM | POA: Diagnosis not present

## 2024-02-21 DIAGNOSIS — R0602 Shortness of breath: Secondary | ICD-10-CM | POA: Insufficient documentation

## 2024-02-21 DIAGNOSIS — R0989 Other specified symptoms and signs involving the circulatory and respiratory systems: Secondary | ICD-10-CM | POA: Diagnosis not present

## 2024-02-21 DIAGNOSIS — R911 Solitary pulmonary nodule: Secondary | ICD-10-CM | POA: Diagnosis not present

## 2024-02-21 DIAGNOSIS — G319 Degenerative disease of nervous system, unspecified: Secondary | ICD-10-CM | POA: Diagnosis not present

## 2024-02-21 DIAGNOSIS — R062 Wheezing: Secondary | ICD-10-CM | POA: Insufficient documentation

## 2024-02-21 DIAGNOSIS — M4802 Spinal stenosis, cervical region: Secondary | ICD-10-CM | POA: Insufficient documentation

## 2024-02-21 DIAGNOSIS — I509 Heart failure, unspecified: Secondary | ICD-10-CM | POA: Diagnosis not present

## 2024-02-21 DIAGNOSIS — I110 Hypertensive heart disease with heart failure: Secondary | ICD-10-CM | POA: Insufficient documentation

## 2024-02-21 DIAGNOSIS — Z7901 Long term (current) use of anticoagulants: Secondary | ICD-10-CM | POA: Diagnosis not present

## 2024-02-21 DIAGNOSIS — M542 Cervicalgia: Secondary | ICD-10-CM | POA: Diagnosis not present

## 2024-02-21 DIAGNOSIS — R059 Cough, unspecified: Secondary | ICD-10-CM | POA: Diagnosis not present

## 2024-02-21 DIAGNOSIS — I480 Paroxysmal atrial fibrillation: Secondary | ICD-10-CM | POA: Diagnosis not present

## 2024-02-21 DIAGNOSIS — M1612 Unilateral primary osteoarthritis, left hip: Secondary | ICD-10-CM | POA: Diagnosis not present

## 2024-02-21 DIAGNOSIS — Y92009 Unspecified place in unspecified non-institutional (private) residence as the place of occurrence of the external cause: Secondary | ICD-10-CM | POA: Diagnosis not present

## 2024-02-21 DIAGNOSIS — S0993XA Unspecified injury of face, initial encounter: Secondary | ICD-10-CM | POA: Diagnosis not present

## 2024-02-21 DIAGNOSIS — M47892 Other spondylosis, cervical region: Secondary | ICD-10-CM | POA: Diagnosis not present

## 2024-02-21 DIAGNOSIS — R918 Other nonspecific abnormal finding of lung field: Secondary | ICD-10-CM | POA: Diagnosis not present

## 2024-02-21 DIAGNOSIS — J9801 Acute bronchospasm: Secondary | ICD-10-CM | POA: Diagnosis not present

## 2024-02-21 DIAGNOSIS — Z043 Encounter for examination and observation following other accident: Secondary | ICD-10-CM | POA: Diagnosis not present

## 2024-02-21 DIAGNOSIS — Z853 Personal history of malignant neoplasm of breast: Secondary | ICD-10-CM | POA: Diagnosis not present

## 2024-02-21 DIAGNOSIS — I6782 Cerebral ischemia: Secondary | ICD-10-CM | POA: Insufficient documentation

## 2024-02-21 DIAGNOSIS — J811 Chronic pulmonary edema: Secondary | ICD-10-CM | POA: Diagnosis not present

## 2024-02-21 NOTE — ED Notes (Addendum)
 Pt with audible wheezes on stretcher in triage. Pt reports no breathing problems.  Pt placed on 2 liters [place]  first nurse lisa rn aware. Provider alex pa-c with pt    c-collar in place  pt alert  skin warm and dry.  Pt anxious.

## 2024-02-21 NOTE — ED Triage Notes (Addendum)
 Pt to triage via wheelchair.  Pt tripped over the rug at home and has left hip pain and neck pain.  No loc.  Pt taking eloquis.  Pt alert  speech clear. C-collar placed in triage.

## 2024-02-22 ENCOUNTER — Emergency Department

## 2024-02-22 ENCOUNTER — Emergency Department
Admission: EM | Admit: 2024-02-22 | Discharge: 2024-02-22 | Disposition: A | Discharge status: Home / Self Care | Attending: Emergency Medicine | Admitting: Emergency Medicine

## 2024-02-22 ENCOUNTER — Other Ambulatory Visit: Payer: Self-pay

## 2024-02-22 DIAGNOSIS — M25552 Pain in left hip: Secondary | ICD-10-CM

## 2024-02-22 DIAGNOSIS — W19XXXA Unspecified fall, initial encounter: Secondary | ICD-10-CM

## 2024-02-22 DIAGNOSIS — J9801 Acute bronchospasm: Secondary | ICD-10-CM

## 2024-02-22 DIAGNOSIS — I509 Heart failure, unspecified: Secondary | ICD-10-CM | POA: Diagnosis not present

## 2024-02-22 DIAGNOSIS — J811 Chronic pulmonary edema: Secondary | ICD-10-CM | POA: Diagnosis not present

## 2024-02-22 DIAGNOSIS — R0989 Other specified symptoms and signs involving the circulatory and respiratory systems: Secondary | ICD-10-CM | POA: Diagnosis not present

## 2024-02-22 DIAGNOSIS — R059 Cough, unspecified: Secondary | ICD-10-CM | POA: Diagnosis not present

## 2024-02-22 LAB — TROPONIN I (HIGH SENSITIVITY)
Troponin I (High Sensitivity): 14 ng/L (ref <18)
Troponin I (High Sensitivity): 16 ng/L (ref <18)

## 2024-02-22 LAB — CBC
HCT: 28.4 % — ABNORMAL LOW (ref 36.0–46.0)
Hemoglobin: 9 g/dL — ABNORMAL LOW (ref 12.0–15.0)
MCH: 34.1 pg — ABNORMAL HIGH (ref 26.0–34.0)
MCHC: 31.7 g/dL (ref 30.0–36.0)
MCV: 107.6 fL — ABNORMAL HIGH (ref 80.0–100.0)
Platelets: 343 10*3/uL (ref 150–400)
RBC: 2.64 MIL/uL — ABNORMAL LOW (ref 3.87–5.11)
RDW: 17.3 % — ABNORMAL HIGH (ref 11.5–15.5)
WBC: 5.5 10*3/uL (ref 4.0–10.5)
nRBC: 1.1 % — ABNORMAL HIGH (ref 0.0–0.2)

## 2024-02-22 LAB — RESP PANEL BY RT-PCR (RSV, FLU A&B, COVID)  RVPGX2
Influenza A by PCR: NEGATIVE
Influenza B by PCR: NEGATIVE
Resp Syncytial Virus by PCR: NEGATIVE
SARS Coronavirus 2 by RT PCR: NEGATIVE

## 2024-02-22 LAB — BASIC METABOLIC PANEL
Anion gap: 11 (ref 5–15)
BUN: 20 mg/dL (ref 8–23)
CO2: 24 mmol/L (ref 22–32)
Calcium: 9.2 mg/dL (ref 8.9–10.3)
Chloride: 101 mmol/L (ref 98–111)
Creatinine, Ser: 0.8 mg/dL (ref 0.44–1.00)
GFR, Estimated: >60 mL/min (ref >60)
Glucose, Bld: 189 mg/dL — ABNORMAL HIGH (ref 70–99)
Potassium: 4.4 mmol/L (ref 3.5–5.1)
Sodium: 136 mmol/L (ref 135–145)

## 2024-02-22 MED ORDER — MAGNESIUM SULFATE 2 GM/50ML IV SOLN
2.0000 g | INTRAVENOUS | Status: AC
  Administered 2024-02-22: 2 g via INTRAVENOUS
  Filled 2024-02-22: qty 50

## 2024-02-22 MED ORDER — IPRATROPIUM-ALBUTEROL 0.5-2.5 (3) MG/3ML IN SOLN
3.0000 mL | Freq: Once | RESPIRATORY_TRACT | Status: AC
  Administered 2024-02-22: 3 mL via RESPIRATORY_TRACT
  Filled 2024-02-22: qty 3

## 2024-02-22 MED ORDER — DOXYCYCLINE HYCLATE 100 MG PO CAPS
100.0000 mg | ORAL_CAPSULE | Freq: Two times a day (BID) | ORAL | 0 refills | Status: AC
Start: 2024-02-22 — End: 2024-03-03

## 2024-02-22 MED ORDER — ALBUTEROL SULFATE (2.5 MG/3ML) 0.083% IN NEBU
5.0000 mg | INHALATION_SOLUTION | Freq: Once | RESPIRATORY_TRACT | Status: AC
  Administered 2024-02-22: 5 mg via RESPIRATORY_TRACT
  Filled 2024-02-22: qty 6

## 2024-02-22 MED ORDER — METHYLPREDNISOLONE SODIUM SUCC 125 MG IJ SOLR
80.0000 mg | INTRAMUSCULAR | Status: AC
  Administered 2024-02-22: 80 mg via INTRAVENOUS
  Filled 2024-02-22: qty 2

## 2024-02-22 MED ORDER — PREDNISONE 20 MG PO TABS: 40.0000 mg | ORAL_TABLET | Freq: Every day | ORAL | 0 refills | Status: AC

## 2024-02-22 NOTE — ED Notes (Signed)
 Pt feeling better. Pt removed from oxygen at this time.

## 2024-02-22 NOTE — ED Provider Notes (Signed)
 [facility] Provider Note    Event Date/Time   First MD Initiated Contact with Patient 02/22/24 0006     (approximate)   History   Chief Complaint: Hip Pain   HPI  Kelsey Carter is a 78 y.o. female with a past history of breast cancer status post mastectomy and chemotherapy, paroxysmal atrial fibrillation on Eliquis, hypertension, GERD reports being in her usual state of health, tripped over a rug and fell.  She developed left hip pain and neck pain after the fall.  No loss of consciousness.  No paresthesias or motor weakness.  While in the waiting room, she developed shortness of breath and was noted to be wheezing.  Oxygen saturation was 89% on room air so she was placed on 2 L nasal cannula and brought to treatment room.  She denies history of COPD.          Physical Exam   Triage Vital Signs: ED Triage Vitals  Encounter Vitals Group     BP 02/21/24 2025 (!) 149/94     Systolic BP Percentile --      Diastolic BP Percentile --      Pulse Rate 02/21/24 2025 81     Resp 02/21/24 2025 18     Temp 02/21/24 2025 98.1 F (36.7 C)     Temp Source 02/21/24 2025 Oral     SpO2 02/21/24 2025 95 %     Weight 02/21/24 2022 115 lb (52.2 kg)     Height 02/21/24 2022 5\' 5"  (1.651 m)     Head Circumference --      Peak Flow --      Pain Score 02/21/24 2022 8     Pain Loc --      Pain Education --      Exclude from Growth Chart --     Most recent vital signs: Vitals:   02/22/24 0330 02/22/24 0405  BP: 139/69 138/80  Pulse: 77 78  Resp: 19   Temp:  97.8 F (36.6 C)  SpO2: 95% 99%    General: Awake, no distress.  CV:  Good peripheral perfusion.  Regular rate rhythm Resp:  Mild tachypnea.  Audible wheezing.  Lungs with prolonged expiratory phase and expiratory wheezing bilaterally. Abd:  No distention.  Soft nontender Other:  For range of motion all extremities.  No bony point tenderness or deformity.  No limb shortening.  No midline spinal  tenderness.  No signs of head trauma.   ED Results / Procedures / Treatments   Labs (all labs ordered are listed, but only abnormal results are displayed) Labs Reviewed  CBC - Abnormal; Notable for the following components:      Result Value   RBC 2.64 (*)    Hemoglobin 9.0 (*)    HCT 28.4 (*)    MCV 107.6 (*)    MCH 34.1 (*)    RDW 17.3 (*)    nRBC 1.1 (*)    All other components within normal limits  BASIC METABOLIC PANEL - Abnormal; Notable for the following components:   Glucose, Bld 189 (*)    All other components within normal limits  RESP PANEL BY RT-PCR (RSV, FLU A&B, COVID)  RVPGX2  TROPONIN I (HIGH SENSITIVITY)  TROPONIN I (HIGH SENSITIVITY)     EKG Interpreted by me Axis, left bundle branch block, no acute ischemic changes.   RADIOLOGY CT head interpreted by me, negative for intracranial hemorrhage.  Radiology report reviewed. CT cervical spine  unremarkable X-ray left hip and pelvis unremarkable   PROCEDURES:  Procedures   MEDICATIONS ORDERED IN ED: Medications  methylPREDNISolone sodium succinate (SOLU-MEDROL) 125 mg/2 mL injection 80 mg (80 mg Intravenous Given 02/22/24 0033)  ipratropium-albuterol (DUONEB) 0.5-2.5 (3) MG/3ML nebulizer solution 3 mL (3 mLs Nebulization Given 02/22/24 0021)  albuterol (PROVENTIL) (2.5 MG/3ML) 0.083% nebulizer solution 5 mg (5 mg Nebulization Given 02/22/24 0021)  magnesium sulfate IVPB 2 g 50 mL (0 g Intravenous Stopped 02/22/24 0129)     IMPRESSION / MDM / ASSESSMENT AND PLAN / ED COURSE  I reviewed the triage vital signs and the nursing notes.  DDx: Intracranial hemorrhage, C-spine fracture, hip fracture, pelvis fracture, pneumonia, pneumothorax, anemia, electrolyte derangement, COVID, influenza  Patient's presentation is most consistent with acute presentation with potential threat to life or bodily function.  Patient presents with mechanical fall at home and left hip pain.  Trauma workup with imaging is negative.   However, patient developed shortness of breath, mild hypoxia.  Found to be obviously wheezing with bronchospasm.  Doubt PE ACS dissection or pericardial effusion.  Will give Solu-Medrol magnesium bronchodilators.   ----------------------------------------- 4:13 AM on 02/22/2024 ----------------------------------------- Feeling much better, wishes to be discharged.  Lungs are clear to auscultation.  Workup reassuring.  Stable for discharge.  She will follow-up with her PCP Dr. Marcello Fennel this week.      FINAL CLINICAL IMPRESSION(S) / ED DIAGNOSES   Final diagnoses:  Left hip pain  Bronchospasm, acute  Fall, initial encounter     Rx / DC Orders   ED Discharge Orders          Ordered    predniSONE (DELTASONE) 20 MG tablet  Daily with breakfast        02/22/24 0411    doxycycline (VIBRAMYCIN) 100 MG capsule  2 times daily        02/22/24 0411             Note:  This document was prepared using Dragon voice recognition software and may include unintentional dictation errors.   Sharman Cheek, MD 02/22/24 [phone]

## 2024-02-22 NOTE — ED Notes (Signed)
 ED Provider at bedside.

## 2024-02-22 NOTE — ED Notes (Signed)
 Pt removed from [place] after breathing tx. Pt was sating 90% on RA. Pt ambulated to toilet in room without oxygen. When pt returned to monitor her saturation was 80% and pt reported increased sob w/ audible wheezing noted. Pt placed back on 2L [place] and quickly returned to above >90%. Pt currently 97% on 2L [place].

## 2024-02-22 NOTE — ED Notes (Signed)
 Pt on stretcher in triage waiting area; tachypnic, diaphoretic, anxious and irritable over wait time; audile wheezing and prod cough clear sputum stained with blood; pt denies any symptoms of this prior to arrival to ED; O2 sat 97% on 2l/min O2 via [place]

## 2024-02-29 DIAGNOSIS — M25552 Pain in left hip: Secondary | ICD-10-CM | POA: Diagnosis not present

## 2024-02-29 DIAGNOSIS — T451X5A Adverse effect of antineoplastic and immunosuppressive drugs, initial encounter: Secondary | ICD-10-CM | POA: Diagnosis not present

## 2024-02-29 DIAGNOSIS — Z853 Personal history of malignant neoplasm of breast: Secondary | ICD-10-CM | POA: Diagnosis not present

## 2024-02-29 DIAGNOSIS — G620 Drug-induced polyneuropathy: Secondary | ICD-10-CM | POA: Diagnosis not present

## 2024-02-29 DIAGNOSIS — Z9181 History of falling: Secondary | ICD-10-CM | POA: Diagnosis not present

## 2024-02-29 DIAGNOSIS — I10 Essential (primary) hypertension: Secondary | ICD-10-CM | POA: Diagnosis not present

## 2024-02-29 DIAGNOSIS — C029 Malignant neoplasm of tongue, unspecified: Secondary | ICD-10-CM | POA: Diagnosis not present

## 2024-02-29 DIAGNOSIS — I480 Paroxysmal atrial fibrillation: Secondary | ICD-10-CM | POA: Diagnosis not present

## 2024-02-29 DIAGNOSIS — R7989 Other specified abnormal findings of blood chemistry: Secondary | ICD-10-CM | POA: Diagnosis not present

## 2024-02-29 DIAGNOSIS — Z9013 Acquired absence of bilateral breasts and nipples: Secondary | ICD-10-CM | POA: Diagnosis not present

## 2024-02-29 DIAGNOSIS — Z79899 Other long term (current) drug therapy: Secondary | ICD-10-CM | POA: Diagnosis not present

## 2024-02-29 DIAGNOSIS — D649 Anemia, unspecified: Secondary | ICD-10-CM | POA: Diagnosis not present

## 2024-02-29 DIAGNOSIS — Z9889 Other specified postprocedural states: Secondary | ICD-10-CM | POA: Diagnosis not present

## 2024-02-29 DIAGNOSIS — I2510 Atherosclerotic heart disease of native coronary artery without angina pectoris: Secondary | ICD-10-CM | POA: Diagnosis not present

## 2024-02-29 DIAGNOSIS — G4709 Other insomnia: Secondary | ICD-10-CM | POA: Diagnosis not present

## 2024-03-23 ENCOUNTER — Ambulatory Visit: Admit: 2024-03-23 | Discharge: 2024-03-24

## 2024-03-23 DIAGNOSIS — C029 Malignant neoplasm of tongue, unspecified: Principal | ICD-10-CM

## 2024-03-23 DIAGNOSIS — R5383 Other fatigue: Principal | ICD-10-CM

## 2024-03-23 DIAGNOSIS — R079 Chest pain, unspecified: Principal | ICD-10-CM

## 2024-03-28 ENCOUNTER — Ambulatory Visit: Admit: 2024-03-28 | Discharge: 2024-03-28

## 2024-03-28 DIAGNOSIS — I10 Essential (primary) hypertension: Principal | ICD-10-CM

## 2024-03-28 DIAGNOSIS — R079 Chest pain, unspecified: Principal | ICD-10-CM

## 2024-03-28 DIAGNOSIS — Z955 Presence of coronary angioplasty implant and graft: Principal | ICD-10-CM

## 2024-03-28 DIAGNOSIS — I2510 Atherosclerotic heart disease of native coronary artery without angina pectoris: Principal | ICD-10-CM

## 2024-03-28 DIAGNOSIS — I5022 Chronic systolic (congestive) heart failure: Principal | ICD-10-CM

## 2024-03-28 DIAGNOSIS — Z87891 Personal history of nicotine dependence: Principal | ICD-10-CM

## 2024-03-28 DIAGNOSIS — R0600 Dyspnea, unspecified: Principal | ICD-10-CM

## 2024-03-28 DIAGNOSIS — Z923 Personal history of irradiation: Principal | ICD-10-CM

## 2024-03-28 DIAGNOSIS — I480 Paroxysmal atrial fibrillation: Principal | ICD-10-CM

## 2024-03-28 DIAGNOSIS — I200 Unstable angina: Principal | ICD-10-CM

## 2024-03-28 DIAGNOSIS — R5383 Other fatigue: Principal | ICD-10-CM

## 2024-03-28 DIAGNOSIS — I25118 Atherosclerotic heart disease of native coronary artery with other forms of angina pectoris: Secondary | ICD-10-CM | POA: Diagnosis not present

## 2024-03-28 MED ORDER — LOSARTAN 50 MG TABLET
ORAL_TABLET | Freq: Every day | ORAL | 6 refills | 30.00 days | Status: CP
Start: 2024-03-28 — End: 2025-05-02

## 2024-03-28 MED ORDER — METOPROLOL SUCCINATE ER 25 MG TABLET,EXTENDED RELEASE 24 HR
ORAL_TABLET | Freq: Every day | ORAL | 2 refills | 60.00 days | Status: CP
Start: 2024-03-28 — End: ?

## 2024-03-30 DIAGNOSIS — R079 Chest pain, unspecified: Secondary | ICD-10-CM | POA: Diagnosis not present

## 2024-03-31 ENCOUNTER — Inpatient Hospital Stay
Admit: 2024-03-31 | Discharge: 2024-04-01 | Disposition: A | Discharge status: Home / Self Care | Payer: Medicare (Managed Care)

## 2024-03-31 ENCOUNTER — Inpatient Hospital Stay
Admit: 2024-03-30 | Discharge: 2024-03-31 | Disposition: A | Discharge status: Home / Self Care | Payer: Medicare (Managed Care)

## 2024-03-31 ENCOUNTER — Ambulatory Visit
Admit: 2024-03-31 | Discharge: 2024-04-01 | Disposition: A | Discharge status: Home / Self Care | Payer: Medicare (Managed Care)

## 2024-03-31 DIAGNOSIS — I10 Essential (primary) hypertension: Secondary | ICD-10-CM | POA: Diagnosis not present

## 2024-03-31 DIAGNOSIS — G629 Polyneuropathy, unspecified: Secondary | ICD-10-CM | POA: Diagnosis not present

## 2024-03-31 DIAGNOSIS — I3481 Nonrheumatic mitral (valve) annulus calcification: Secondary | ICD-10-CM | POA: Diagnosis not present

## 2024-03-31 DIAGNOSIS — E7800 Pure hypercholesterolemia, unspecified: Secondary | ICD-10-CM | POA: Diagnosis not present

## 2024-03-31 DIAGNOSIS — Z79899 Other long term (current) drug therapy: Secondary | ICD-10-CM | POA: Diagnosis not present

## 2024-03-31 DIAGNOSIS — J811 Chronic pulmonary edema: Secondary | ICD-10-CM | POA: Diagnosis not present

## 2024-03-31 DIAGNOSIS — Z955 Presence of coronary angioplasty implant and graft: Secondary | ICD-10-CM | POA: Diagnosis not present

## 2024-03-31 DIAGNOSIS — D539 Nutritional anemia, unspecified: Secondary | ICD-10-CM | POA: Diagnosis not present

## 2024-03-31 DIAGNOSIS — G8929 Other chronic pain: Secondary | ICD-10-CM | POA: Diagnosis not present

## 2024-03-31 DIAGNOSIS — I6529 Occlusion and stenosis of unspecified carotid artery: Secondary | ICD-10-CM | POA: Diagnosis not present

## 2024-03-31 DIAGNOSIS — I110 Hypertensive heart disease with heart failure: Secondary | ICD-10-CM | POA: Diagnosis not present

## 2024-03-31 DIAGNOSIS — I5023 Acute on chronic systolic (congestive) heart failure: Secondary | ICD-10-CM | POA: Diagnosis not present

## 2024-03-31 DIAGNOSIS — R079 Chest pain, unspecified: Secondary | ICD-10-CM | POA: Diagnosis not present

## 2024-03-31 DIAGNOSIS — I25110 Atherosclerotic heart disease of native coronary artery with unstable angina pectoris: Secondary | ICD-10-CM | POA: Diagnosis not present

## 2024-03-31 DIAGNOSIS — R0902 Hypoxemia: Secondary | ICD-10-CM | POA: Diagnosis not present

## 2024-03-31 DIAGNOSIS — Z9013 Acquired absence of bilateral breasts and nipples: Secondary | ICD-10-CM | POA: Diagnosis not present

## 2024-03-31 DIAGNOSIS — I480 Paroxysmal atrial fibrillation: Secondary | ICD-10-CM | POA: Diagnosis not present

## 2024-03-31 DIAGNOSIS — R000 Tachycardia, unspecified: Secondary | ICD-10-CM | POA: Diagnosis not present

## 2024-03-31 DIAGNOSIS — I272 Pulmonary hypertension, unspecified: Secondary | ICD-10-CM | POA: Diagnosis not present

## 2024-03-31 DIAGNOSIS — I447 Left bundle-branch block, unspecified: Secondary | ICD-10-CM | POA: Diagnosis not present

## 2024-03-31 DIAGNOSIS — Z79890 Hormone replacement therapy (postmenopausal): Secondary | ICD-10-CM | POA: Diagnosis not present

## 2024-03-31 DIAGNOSIS — I77810 Thoracic aortic ectasia: Secondary | ICD-10-CM | POA: Diagnosis not present

## 2024-03-31 DIAGNOSIS — Z853 Personal history of malignant neoplasm of breast: Secondary | ICD-10-CM | POA: Diagnosis not present

## 2024-03-31 DIAGNOSIS — I5020 Unspecified systolic (congestive) heart failure: Secondary | ICD-10-CM | POA: Diagnosis not present

## 2024-03-31 DIAGNOSIS — E039 Hypothyroidism, unspecified: Secondary | ICD-10-CM | POA: Diagnosis not present

## 2024-03-31 DIAGNOSIS — Z923 Personal history of irradiation: Secondary | ICD-10-CM | POA: Diagnosis not present

## 2024-03-31 DIAGNOSIS — J90 Pleural effusion, not elsewhere classified: Secondary | ICD-10-CM | POA: Diagnosis not present

## 2024-03-31 DIAGNOSIS — Z7901 Long term (current) use of anticoagulants: Secondary | ICD-10-CM | POA: Diagnosis not present

## 2024-03-31 DIAGNOSIS — E785 Hyperlipidemia, unspecified: Secondary | ICD-10-CM | POA: Diagnosis not present

## 2024-03-31 DIAGNOSIS — I2510 Atherosclerotic heart disease of native coronary artery without angina pectoris: Secondary | ICD-10-CM | POA: Diagnosis not present

## 2024-04-01 MED ORDER — FUROSEMIDE 20 MG TABLET
ORAL_TABLET | Freq: Every day | ORAL | 1 refills | 90.00 days | Status: CP
Start: 2024-04-01 — End: 2024-09-28

## 2024-04-06 DIAGNOSIS — I5020 Unspecified systolic (congestive) heart failure: Secondary | ICD-10-CM | POA: Diagnosis not present

## 2024-04-06 DIAGNOSIS — Z171 Estrogen receptor negative status [ER-]: Secondary | ICD-10-CM | POA: Diagnosis not present

## 2024-04-06 DIAGNOSIS — C50011 Malignant neoplasm of nipple and areola, right female breast: Secondary | ICD-10-CM | POA: Diagnosis not present

## 2024-04-06 DIAGNOSIS — M25522 Pain in left elbow: Secondary | ICD-10-CM | POA: Diagnosis not present

## 2024-04-06 DIAGNOSIS — Z9889 Other specified postprocedural states: Secondary | ICD-10-CM | POA: Diagnosis not present

## 2024-04-06 DIAGNOSIS — I10 Essential (primary) hypertension: Secondary | ICD-10-CM | POA: Diagnosis not present

## 2024-04-06 DIAGNOSIS — C50012 Malignant neoplasm of nipple and areola, left female breast: Secondary | ICD-10-CM | POA: Diagnosis not present

## 2024-04-06 DIAGNOSIS — Z9013 Acquired absence of bilateral breasts and nipples: Secondary | ICD-10-CM | POA: Diagnosis not present

## 2024-05-04 DIAGNOSIS — R051 Acute cough: Secondary | ICD-10-CM | POA: Diagnosis not present

## 2024-05-04 DIAGNOSIS — J40 Bronchitis, not specified as acute or chronic: Secondary | ICD-10-CM | POA: Diagnosis not present

## 2024-06-27 ENCOUNTER — Ambulatory Visit: Admit: 2024-06-27 | Discharge: 2024-06-28 | Payer: Medicare (Managed Care)

## 2024-06-27 DIAGNOSIS — I5020 Unspecified systolic (congestive) heart failure: Principal | ICD-10-CM

## 2024-06-27 DIAGNOSIS — E785 Hyperlipidemia, unspecified: Principal | ICD-10-CM

## 2024-06-27 DIAGNOSIS — I480 Paroxysmal atrial fibrillation: Principal | ICD-10-CM

## 2024-06-27 DIAGNOSIS — R0609 Other forms of dyspnea: Principal | ICD-10-CM

## 2024-06-27 DIAGNOSIS — I10 Essential (primary) hypertension: Principal | ICD-10-CM

## 2024-06-27 DIAGNOSIS — I25110 Atherosclerotic heart disease of native coronary artery with unstable angina pectoris: Principal | ICD-10-CM

## 2024-06-27 DIAGNOSIS — R946 Abnormal results of thyroid function studies: Secondary | ICD-10-CM | POA: Diagnosis not present

## 2024-06-27 DIAGNOSIS — Z853 Personal history of malignant neoplasm of breast: Secondary | ICD-10-CM | POA: Diagnosis not present

## 2024-06-27 DIAGNOSIS — Z9013 Acquired absence of bilateral breasts and nipples: Secondary | ICD-10-CM | POA: Diagnosis not present

## 2024-06-27 DIAGNOSIS — M25552 Pain in left hip: Secondary | ICD-10-CM | POA: Diagnosis not present

## 2024-06-27 DIAGNOSIS — Z9181 History of falling: Secondary | ICD-10-CM | POA: Diagnosis not present

## 2024-06-27 DIAGNOSIS — D649 Anemia, unspecified: Secondary | ICD-10-CM | POA: Diagnosis not present

## 2024-06-27 MED ORDER — SPIRONOLACTONE 25 MG TABLET
ORAL_TABLET | Freq: Every day | ORAL | 3 refills | 60.00000 days | Status: CP
Start: 2024-06-27 — End: 2025-08-01

## 2024-06-27 MED ORDER — LOSARTAN 100 MG TABLET
ORAL_TABLET | Freq: Every day | ORAL | 3 refills | 60.00000 days | Status: CP
Start: 2024-06-27 — End: 2025-08-01

## 2024-07-04 DIAGNOSIS — I480 Paroxysmal atrial fibrillation: Secondary | ICD-10-CM | POA: Diagnosis not present

## 2024-07-04 DIAGNOSIS — D649 Anemia, unspecified: Secondary | ICD-10-CM | POA: Diagnosis not present

## 2024-07-04 DIAGNOSIS — Z0000 Encounter for general adult medical examination without abnormal findings: Secondary | ICD-10-CM | POA: Diagnosis not present

## 2024-07-04 DIAGNOSIS — G620 Drug-induced polyneuropathy: Secondary | ICD-10-CM | POA: Diagnosis not present

## 2024-07-04 DIAGNOSIS — E039 Hypothyroidism, unspecified: Secondary | ICD-10-CM | POA: Diagnosis not present

## 2024-07-04 DIAGNOSIS — I10 Essential (primary) hypertension: Secondary | ICD-10-CM | POA: Diagnosis not present

## 2024-07-04 DIAGNOSIS — R7309 Other abnormal glucose: Secondary | ICD-10-CM | POA: Diagnosis not present

## 2024-07-04 DIAGNOSIS — M5136 Other intervertebral disc degeneration, lumbar region with discogenic back pain only: Secondary | ICD-10-CM | POA: Diagnosis not present

## 2024-07-04 DIAGNOSIS — I5020 Unspecified systolic (congestive) heart failure: Secondary | ICD-10-CM | POA: Diagnosis not present

## 2024-07-04 DIAGNOSIS — T451X5A Adverse effect of antineoplastic and immunosuppressive drugs, initial encounter: Secondary | ICD-10-CM | POA: Diagnosis not present

## 2024-07-27 ENCOUNTER — Ambulatory Visit: Admit: 2024-07-27 | Discharge: 2024-07-28 | Payer: Medicare (Managed Care)

## 2024-07-27 DIAGNOSIS — C029 Malignant neoplasm of tongue, unspecified: Principal | ICD-10-CM

## 2024-07-27 DIAGNOSIS — R911 Solitary pulmonary nodule: Principal | ICD-10-CM

## 2024-08-04 ENCOUNTER — Inpatient Hospital Stay: Admit: 2024-08-04 | Discharge: 2024-08-04 | Payer: Medicare (Managed Care)

## 2024-08-04 DIAGNOSIS — K449 Diaphragmatic hernia without obstruction or gangrene: Secondary | ICD-10-CM | POA: Diagnosis not present

## 2024-08-04 DIAGNOSIS — R911 Solitary pulmonary nodule: Secondary | ICD-10-CM | POA: Diagnosis not present

## 2024-08-04 DIAGNOSIS — I77810 Thoracic aortic ectasia: Secondary | ICD-10-CM | POA: Diagnosis not present

## 2024-08-08 ENCOUNTER — Ambulatory Visit: Admit: 2024-08-08 | Discharge: 2024-08-09 | Payer: Medicare (Managed Care)

## 2024-08-08 DIAGNOSIS — I200 Unstable angina: Principal | ICD-10-CM

## 2024-08-08 DIAGNOSIS — I3481 Mitral annular calcification: Principal | ICD-10-CM

## 2024-08-08 DIAGNOSIS — I10 Essential (primary) hypertension: Principal | ICD-10-CM

## 2024-08-08 DIAGNOSIS — E785 Hyperlipidemia, unspecified: Principal | ICD-10-CM

## 2024-08-08 DIAGNOSIS — I25110 Atherosclerotic heart disease of native coronary artery with unstable angina pectoris: Principal | ICD-10-CM

## 2024-08-08 DIAGNOSIS — I480 Paroxysmal atrial fibrillation: Principal | ICD-10-CM

## 2024-08-08 DIAGNOSIS — I5020 Unspecified systolic (congestive) heart failure: Principal | ICD-10-CM

## 2024-08-08 DIAGNOSIS — R079 Chest pain, unspecified: Principal | ICD-10-CM

## 2024-08-08 DIAGNOSIS — Z955 Presence of coronary angioplasty implant and graft: Secondary | ICD-10-CM | POA: Diagnosis not present

## 2024-08-08 DIAGNOSIS — Z9221 Personal history of antineoplastic chemotherapy: Secondary | ICD-10-CM | POA: Diagnosis not present

## 2024-08-08 DIAGNOSIS — Z923 Personal history of irradiation: Secondary | ICD-10-CM | POA: Diagnosis not present

## 2024-08-08 DIAGNOSIS — E1140 Type 2 diabetes mellitus with diabetic neuropathy, unspecified: Secondary | ICD-10-CM | POA: Diagnosis not present

## 2024-08-08 DIAGNOSIS — I272 Pulmonary hypertension, unspecified: Secondary | ICD-10-CM | POA: Diagnosis not present

## 2024-08-08 DIAGNOSIS — I110 Hypertensive heart disease with heart failure: Secondary | ICD-10-CM | POA: Diagnosis not present

## 2024-08-08 DIAGNOSIS — Z885 Allergy status to narcotic agent status: Secondary | ICD-10-CM | POA: Diagnosis not present

## 2024-08-08 DIAGNOSIS — I5022 Chronic systolic (congestive) heart failure: Secondary | ICD-10-CM | POA: Diagnosis not present

## 2024-08-08 DIAGNOSIS — Z7901 Long term (current) use of anticoagulants: Secondary | ICD-10-CM | POA: Diagnosis not present

## 2024-08-08 DIAGNOSIS — I2510 Atherosclerotic heart disease of native coronary artery without angina pectoris: Secondary | ICD-10-CM | POA: Diagnosis not present

## 2024-08-08 DIAGNOSIS — I6529 Occlusion and stenosis of unspecified carotid artery: Secondary | ICD-10-CM | POA: Diagnosis not present

## 2024-08-08 DIAGNOSIS — M4850XA Collapsed vertebra, not elsewhere classified, site unspecified, initial encounter for fracture: Secondary | ICD-10-CM | POA: Diagnosis not present

## 2024-08-08 DIAGNOSIS — R413 Other amnesia: Secondary | ICD-10-CM | POA: Diagnosis not present

## 2024-08-08 DIAGNOSIS — D539 Nutritional anemia, unspecified: Secondary | ICD-10-CM | POA: Diagnosis not present

## 2024-08-08 DIAGNOSIS — Z79899 Other long term (current) drug therapy: Secondary | ICD-10-CM | POA: Diagnosis not present

## 2024-08-08 DIAGNOSIS — I447 Left bundle-branch block, unspecified: Secondary | ICD-10-CM | POA: Diagnosis not present

## 2024-08-08 MED ORDER — METOPROLOL SUCCINATE ER 25 MG TABLET,EXTENDED RELEASE 24 HR
ORAL_TABLET | Freq: Two times a day (BID) | ORAL | 2 refills | 30.00000 days | Status: CP
Start: 2024-08-08 — End: ?

## 2024-08-08 MED ORDER — SACUBITRIL 24 MG-VALSARTAN 26 MG TABLET
ORAL_TABLET | Freq: Two times a day (BID) | ORAL | 2 refills | 90.00000 days | Status: CP
Start: 2024-08-08 — End: 2025-05-05

## 2024-08-09 DIAGNOSIS — R079 Chest pain, unspecified: Secondary | ICD-10-CM | POA: Diagnosis not present

## 2024-08-15 MED ORDER — APIXABAN 5 MG TABLET
ORAL_TABLET | Freq: Two times a day (BID) | ORAL | 1 refills | 90.00000 days | Status: CP
Start: 2024-08-15 — End: ?

## 2024-09-26 ENCOUNTER — Inpatient Hospital Stay: Admit: 2024-09-26 | Discharge: 2024-09-26 | Payer: Medicare (Managed Care)

## 2024-09-26 ENCOUNTER — Ambulatory Visit: Admit: 2024-09-26 | Discharge: 2024-09-26 | Payer: Medicare (Managed Care)

## 2024-09-26 DIAGNOSIS — I10 Essential (primary) hypertension: Principal | ICD-10-CM

## 2024-09-26 DIAGNOSIS — I200 Unstable angina: Principal | ICD-10-CM

## 2024-09-26 DIAGNOSIS — I25110 Atherosclerotic heart disease of native coronary artery with unstable angina pectoris: Principal | ICD-10-CM

## 2024-09-26 DIAGNOSIS — I5020 Unspecified systolic (congestive) heart failure: Principal | ICD-10-CM

## 2024-09-26 DIAGNOSIS — I480 Paroxysmal atrial fibrillation: Principal | ICD-10-CM

## 2024-09-26 DIAGNOSIS — I3481 Nonrheumatic mitral (valve) annulus calcification: Secondary | ICD-10-CM | POA: Diagnosis not present

## 2024-09-26 DIAGNOSIS — Z79899 Other long term (current) drug therapy: Secondary | ICD-10-CM | POA: Diagnosis not present

## 2024-09-26 DIAGNOSIS — D539 Nutritional anemia, unspecified: Secondary | ICD-10-CM | POA: Diagnosis not present

## 2024-09-26 DIAGNOSIS — I272 Pulmonary hypertension, unspecified: Secondary | ICD-10-CM | POA: Diagnosis not present

## 2024-09-26 DIAGNOSIS — E1140 Type 2 diabetes mellitus with diabetic neuropathy, unspecified: Secondary | ICD-10-CM | POA: Diagnosis not present

## 2024-09-26 DIAGNOSIS — Z7901 Long term (current) use of anticoagulants: Secondary | ICD-10-CM | POA: Diagnosis not present

## 2024-09-26 DIAGNOSIS — I447 Left bundle-branch block, unspecified: Secondary | ICD-10-CM | POA: Diagnosis not present

## 2024-09-26 DIAGNOSIS — I6522 Occlusion and stenosis of left carotid artery: Secondary | ICD-10-CM | POA: Diagnosis not present

## 2024-09-26 DIAGNOSIS — I2510 Atherosclerotic heart disease of native coronary artery without angina pectoris: Secondary | ICD-10-CM | POA: Diagnosis not present

## 2024-09-26 DIAGNOSIS — E785 Hyperlipidemia, unspecified: Secondary | ICD-10-CM | POA: Diagnosis not present

## 2024-09-26 DIAGNOSIS — Z79890 Hormone replacement therapy (postmenopausal): Secondary | ICD-10-CM | POA: Diagnosis not present

## 2024-09-26 DIAGNOSIS — I340 Nonrheumatic mitral (valve) insufficiency: Secondary | ICD-10-CM | POA: Diagnosis not present

## 2024-09-26 DIAGNOSIS — I77810 Thoracic aortic ectasia: Secondary | ICD-10-CM | POA: Diagnosis not present

## 2024-09-26 DIAGNOSIS — I519 Heart disease, unspecified: Secondary | ICD-10-CM | POA: Diagnosis not present

## 2024-09-26 DIAGNOSIS — R413 Other amnesia: Secondary | ICD-10-CM | POA: Diagnosis not present

## 2024-09-26 DIAGNOSIS — I5042 Chronic combined systolic (congestive) and diastolic (congestive) heart failure: Secondary | ICD-10-CM | POA: Diagnosis not present

## 2024-09-26 DIAGNOSIS — I110 Hypertensive heart disease with heart failure: Secondary | ICD-10-CM | POA: Diagnosis not present

## 2024-09-26 DIAGNOSIS — I77819 Aortic ectasia, unspecified site: Secondary | ICD-10-CM | POA: Diagnosis not present

## 2024-09-26 MED ORDER — SACUBITRIL 49 MG-VALSARTAN 51 MG TABLET
ORAL_TABLET | Freq: Two times a day (BID) | ORAL | 1 refills | 90.00000 days | Status: CP
Start: 2024-09-26 — End: 2025-03-25

## 2024-10-30 ENCOUNTER — Ambulatory Visit: Admit: 2024-10-30 | Discharge: 2024-10-31 | Payer: Medicare (Managed Care)

## 2024-10-30 DIAGNOSIS — I5020 Unspecified systolic (congestive) heart failure: Principal | ICD-10-CM

## 2024-10-30 DIAGNOSIS — I480 Paroxysmal atrial fibrillation: Principal | ICD-10-CM

## 2024-10-30 DIAGNOSIS — Z853 Personal history of malignant neoplasm of breast: Secondary | ICD-10-CM | POA: Diagnosis not present

## 2024-11-01 DIAGNOSIS — R001 Bradycardia, unspecified: Secondary | ICD-10-CM | POA: Diagnosis not present

## 2024-11-01 DIAGNOSIS — I440 Atrioventricular block, first degree: Secondary | ICD-10-CM | POA: Diagnosis not present

## 2024-11-01 DIAGNOSIS — I447 Left bundle-branch block, unspecified: Secondary | ICD-10-CM | POA: Diagnosis not present

## 2024-11-01 DIAGNOSIS — R9431 Abnormal electrocardiogram [ECG] [EKG]: Secondary | ICD-10-CM | POA: Diagnosis not present

## 2024-11-08 ENCOUNTER — Inpatient Hospital Stay
Admission: RE | Admit: 2024-11-08 | Discharge: 2024-11-09 | Disposition: A | Discharge status: Home / Self Care | Payer: Medicare (Managed Care)

## 2024-11-08 ENCOUNTER — Ambulatory Visit
Admission: RE | Admit: 2024-11-08 | Discharge: 2024-11-09 | Disposition: A | Discharge status: Home / Self Care | Payer: Medicare (Managed Care)

## 2024-11-08 DIAGNOSIS — I5022 Chronic systolic (congestive) heart failure: Secondary | ICD-10-CM | POA: Diagnosis not present

## 2024-11-08 MED ORDER — TRAMADOL 50 MG TABLET
ORAL_TABLET | Freq: Four times a day (QID) | ORAL | 0 refills | 3.00000 days | Status: CP | PRN
Start: 2024-11-08 — End: 2024-11-13

## 2024-11-09 MED ORDER — APIXABAN 2.5 MG TABLET
ORAL_TABLET | Freq: Two times a day (BID) | ORAL | 3 refills | 90.00000 days | Status: CP
Start: 2024-11-09 — End: ?

## 2024-11-15 DIAGNOSIS — I10 Essential (primary) hypertension: Secondary | ICD-10-CM | POA: Diagnosis not present

## 2024-11-15 DIAGNOSIS — Z09 Encounter for follow-up examination after completed treatment for conditions other than malignant neoplasm: Secondary | ICD-10-CM | POA: Diagnosis not present

## 2024-11-15 DIAGNOSIS — E039 Hypothyroidism, unspecified: Secondary | ICD-10-CM | POA: Diagnosis not present

## 2024-11-15 DIAGNOSIS — I5020 Unspecified systolic (congestive) heart failure: Secondary | ICD-10-CM | POA: Diagnosis not present

## 2024-11-15 DIAGNOSIS — I480 Paroxysmal atrial fibrillation: Secondary | ICD-10-CM | POA: Diagnosis not present

## 2024-11-15 DIAGNOSIS — N1831 Chronic kidney disease, stage 3a: Secondary | ICD-10-CM | POA: Diagnosis not present

## 2024-11-15 DIAGNOSIS — R7989 Other specified abnormal findings of blood chemistry: Secondary | ICD-10-CM | POA: Diagnosis not present

## 2024-11-20 ENCOUNTER — Ambulatory Visit: Admit: 2024-11-20 | Discharge: 2024-11-21 | Payer: Medicare (Managed Care)

## 2024-11-20 DIAGNOSIS — Z4502 Encounter for adjustment and management of automatic implantable cardiac defibrillator: Secondary | ICD-10-CM | POA: Diagnosis not present

## 2024-11-20 DIAGNOSIS — Z09 Encounter for follow-up examination after completed treatment for conditions other than malignant neoplasm: Secondary | ICD-10-CM | POA: Diagnosis not present

## 2024-11-28 ENCOUNTER — Ambulatory Visit: Admit: 2024-11-28 | Discharge: 2024-11-29 | Payer: Medicare (Managed Care)

## 2024-11-28 DIAGNOSIS — I10 Essential (primary) hypertension: Principal | ICD-10-CM

## 2024-11-28 DIAGNOSIS — I480 Paroxysmal atrial fibrillation: Principal | ICD-10-CM

## 2024-11-28 DIAGNOSIS — E785 Hyperlipidemia, unspecified: Principal | ICD-10-CM

## 2024-11-28 DIAGNOSIS — I3481 Mitral annular calcification: Principal | ICD-10-CM

## 2024-11-28 DIAGNOSIS — I5020 Unspecified systolic (congestive) heart failure: Principal | ICD-10-CM

## 2024-11-28 DIAGNOSIS — I25110 Atherosclerotic heart disease of native coronary artery with unstable angina pectoris: Principal | ICD-10-CM

## 2024-11-28 DIAGNOSIS — R079 Chest pain, unspecified: Principal | ICD-10-CM

## 2024-11-28 DIAGNOSIS — I200 Unstable angina: Principal | ICD-10-CM

## 2024-11-30 ENCOUNTER — Ambulatory Visit: Admit: 2024-11-30 | Discharge: 2024-12-01 | Payer: Medicare (Managed Care)

## 2024-11-30 DIAGNOSIS — C029 Malignant neoplasm of tongue, unspecified: Principal | ICD-10-CM

## 2025-01-19 ENCOUNTER — Encounter: Admit: 2025-01-19 | Discharge: 2025-01-19 | Payer: Medicare (Managed Care)
